# Patient Record
Sex: Female | Born: 1940 | Race: White | Hispanic: No | Smoking: Former smoker
Health system: Southern US, Community
[De-identification: ages and names within clinical notes are randomized; demographics above are authoritative.]

## PROBLEM LIST (undated history)

## (undated) DIAGNOSIS — I350 Nonrheumatic aortic (valve) stenosis: Secondary | ICD-10-CM

## (undated) DIAGNOSIS — I472 Ventricular tachycardia, unspecified: Secondary | ICD-10-CM

## (undated) DIAGNOSIS — I779 Disorder of arteries and arterioles, unspecified: Secondary | ICD-10-CM

## (undated) DIAGNOSIS — E669 Obesity, unspecified: Secondary | ICD-10-CM

## (undated) DIAGNOSIS — M1990 Unspecified osteoarthritis, unspecified site: Secondary | ICD-10-CM

## (undated) DIAGNOSIS — K529 Noninfective gastroenteritis and colitis, unspecified: Secondary | ICD-10-CM

## (undated) DIAGNOSIS — J42 Unspecified chronic bronchitis: Secondary | ICD-10-CM

## (undated) DIAGNOSIS — I10 Essential (primary) hypertension: Secondary | ICD-10-CM

## (undated) DIAGNOSIS — E119 Type 2 diabetes mellitus without complications: Secondary | ICD-10-CM

## (undated) DIAGNOSIS — K219 Gastro-esophageal reflux disease without esophagitis: Secondary | ICD-10-CM

## (undated) DIAGNOSIS — I739 Peripheral vascular disease, unspecified: Secondary | ICD-10-CM

## (undated) DIAGNOSIS — M75100 Unspecified rotator cuff tear or rupture of unspecified shoulder, not specified as traumatic: Secondary | ICD-10-CM

## (undated) DIAGNOSIS — J310 Chronic rhinitis: Secondary | ICD-10-CM

## (undated) DIAGNOSIS — I4729 Other ventricular tachycardia: Secondary | ICD-10-CM

## (undated) DIAGNOSIS — K5790 Diverticulosis of intestine, part unspecified, without perforation or abscess without bleeding: Secondary | ICD-10-CM

## (undated) DIAGNOSIS — G629 Polyneuropathy, unspecified: Secondary | ICD-10-CM

## (undated) DIAGNOSIS — E785 Hyperlipidemia, unspecified: Secondary | ICD-10-CM

## (undated) DIAGNOSIS — I219 Acute myocardial infarction, unspecified: Secondary | ICD-10-CM

## (undated) DIAGNOSIS — I2510 Atherosclerotic heart disease of native coronary artery without angina pectoris: Secondary | ICD-10-CM

## (undated) DIAGNOSIS — Z8669 Personal history of other diseases of the nervous system and sense organs: Secondary | ICD-10-CM

## (undated) HISTORY — DX: Acute myocardial infarction, unspecified: I21.9

## (undated) HISTORY — DX: Chronic rhinitis: J31.0

## (undated) HISTORY — DX: Unspecified rotator cuff tear or rupture of unspecified shoulder, not specified as traumatic: M75.100

## (undated) HISTORY — DX: Diverticulosis of intestine, part unspecified, without perforation or abscess without bleeding: K57.90

## (undated) HISTORY — DX: Hyperlipidemia, unspecified: E78.5

## (undated) HISTORY — DX: Ventricular tachycardia: I47.2

## (undated) HISTORY — DX: Nonrheumatic aortic (valve) stenosis: I35.0

## (undated) HISTORY — DX: Unspecified osteoarthritis, unspecified site: M19.90

## (undated) HISTORY — PX: TOTAL HIP ARTHROPLASTY: SHX124

## (undated) HISTORY — PX: CORONARY ANGIOPLASTY WITH STENT PLACEMENT: SHX49

## (undated) HISTORY — DX: Noninfective gastroenteritis and colitis, unspecified: K52.9

## (undated) HISTORY — PX: TONSILLECTOMY: SUR1361

## (undated) HISTORY — DX: Atherosclerotic heart disease of native coronary artery without angina pectoris: I25.10

## (undated) HISTORY — DX: Disorder of arteries and arterioles, unspecified: I77.9

## (undated) HISTORY — PX: ANGIOPLASTY: SHX39

## (undated) HISTORY — DX: Gastro-esophageal reflux disease without esophagitis: K21.9

## (undated) HISTORY — DX: Essential (primary) hypertension: I10

## (undated) HISTORY — PX: TOTAL ABDOMINAL HYSTERECTOMY: SHX209

## (undated) HISTORY — DX: Ventricular tachycardia, unspecified: I47.20

## (undated) HISTORY — DX: Type 2 diabetes mellitus without complications: E11.9

## (undated) HISTORY — DX: Peripheral vascular disease, unspecified: I73.9

## (undated) HISTORY — DX: Other ventricular tachycardia: I47.29

## (undated) HISTORY — DX: Obesity, unspecified: E66.9

---

## 2000-05-27 ENCOUNTER — Encounter: Payer: Self-pay | Admitting: *Deleted

## 2000-05-27 ENCOUNTER — Encounter: Admission: RE | Admit: 2000-05-27 | Discharge: 2000-05-27 | Payer: Self-pay | Admitting: *Deleted

## 2000-06-02 ENCOUNTER — Encounter: Payer: Self-pay | Admitting: *Deleted

## 2000-06-02 ENCOUNTER — Encounter: Admission: RE | Admit: 2000-06-02 | Discharge: 2000-06-02 | Payer: Self-pay | Admitting: *Deleted

## 2000-07-01 ENCOUNTER — Other Ambulatory Visit: Admission: RE | Admit: 2000-07-01 | Discharge: 2000-07-01 | Payer: Self-pay | Admitting: *Deleted

## 2000-10-13 ENCOUNTER — Encounter: Payer: Self-pay | Admitting: Internal Medicine

## 2000-10-13 ENCOUNTER — Ambulatory Visit (HOSPITAL_COMMUNITY): Admission: RE | Admit: 2000-10-13 | Discharge: 2000-10-13 | Payer: Self-pay | Admitting: Gastroenterology

## 2001-07-06 ENCOUNTER — Encounter: Payer: Self-pay | Admitting: *Deleted

## 2001-07-06 ENCOUNTER — Encounter: Admission: RE | Admit: 2001-07-06 | Discharge: 2001-07-06 | Payer: Self-pay | Admitting: *Deleted

## 2002-10-14 ENCOUNTER — Encounter: Admission: RE | Admit: 2002-10-14 | Discharge: 2002-10-14 | Payer: Self-pay | Admitting: Family Medicine

## 2002-10-14 ENCOUNTER — Encounter: Payer: Self-pay | Admitting: Family Medicine

## 2004-01-23 ENCOUNTER — Encounter: Admission: RE | Admit: 2004-01-23 | Discharge: 2004-01-23 | Payer: Self-pay | Admitting: Family Medicine

## 2004-12-03 ENCOUNTER — Inpatient Hospital Stay (HOSPITAL_COMMUNITY): Admission: EM | Admit: 2004-12-03 | Discharge: 2004-12-04 | Payer: Self-pay | Admitting: Family Medicine

## 2005-01-31 ENCOUNTER — Ambulatory Visit (HOSPITAL_COMMUNITY): Admission: RE | Admit: 2005-01-31 | Discharge: 2005-01-31 | Payer: Self-pay | Admitting: Family Medicine

## 2005-02-03 ENCOUNTER — Ambulatory Visit: Payer: Self-pay | Admitting: Internal Medicine

## 2005-02-06 ENCOUNTER — Ambulatory Visit: Payer: Self-pay | Admitting: Cardiology

## 2005-02-06 ENCOUNTER — Encounter: Payer: Self-pay | Admitting: Internal Medicine

## 2005-02-26 ENCOUNTER — Ambulatory Visit: Payer: Self-pay | Admitting: Internal Medicine

## 2005-03-13 ENCOUNTER — Ambulatory Visit: Payer: Self-pay | Admitting: Internal Medicine

## 2005-03-18 ENCOUNTER — Ambulatory Visit: Payer: Self-pay | Admitting: Internal Medicine

## 2005-03-18 ENCOUNTER — Ambulatory Visit: Admission: RE | Admit: 2005-03-18 | Discharge: 2005-03-18 | Payer: Self-pay | Admitting: Internal Medicine

## 2005-03-18 ENCOUNTER — Encounter (INDEPENDENT_AMBULATORY_CARE_PROVIDER_SITE_OTHER): Payer: Self-pay | Admitting: Specialist

## 2005-03-25 ENCOUNTER — Ambulatory Visit: Payer: Self-pay | Admitting: Internal Medicine

## 2005-04-09 ENCOUNTER — Ambulatory Visit: Payer: Self-pay | Admitting: Internal Medicine

## 2005-04-29 ENCOUNTER — Ambulatory Visit: Payer: Self-pay | Admitting: Internal Medicine

## 2005-05-13 ENCOUNTER — Ambulatory Visit (HOSPITAL_COMMUNITY): Admission: RE | Admit: 2005-05-13 | Discharge: 2005-05-13 | Payer: Self-pay | Admitting: Internal Medicine

## 2005-05-13 ENCOUNTER — Encounter: Payer: Self-pay | Admitting: Internal Medicine

## 2005-05-15 ENCOUNTER — Ambulatory Visit: Payer: Self-pay | Admitting: Pulmonary Disease

## 2005-05-27 ENCOUNTER — Ambulatory Visit: Payer: Self-pay | Admitting: Internal Medicine

## 2006-09-10 ENCOUNTER — Ambulatory Visit: Payer: Self-pay | Admitting: *Deleted

## 2007-08-27 ENCOUNTER — Encounter: Payer: Self-pay | Admitting: Critical Care Medicine

## 2007-09-13 DIAGNOSIS — E785 Hyperlipidemia, unspecified: Secondary | ICD-10-CM | POA: Insufficient documentation

## 2007-09-13 DIAGNOSIS — I10 Essential (primary) hypertension: Secondary | ICD-10-CM | POA: Insufficient documentation

## 2007-09-13 DIAGNOSIS — J310 Chronic rhinitis: Secondary | ICD-10-CM | POA: Insufficient documentation

## 2007-09-14 ENCOUNTER — Ambulatory Visit: Payer: Self-pay | Admitting: Critical Care Medicine

## 2007-09-14 DIAGNOSIS — E119 Type 2 diabetes mellitus without complications: Secondary | ICD-10-CM

## 2007-09-15 DIAGNOSIS — K219 Gastro-esophageal reflux disease without esophagitis: Secondary | ICD-10-CM

## 2007-09-16 ENCOUNTER — Telehealth (INDEPENDENT_AMBULATORY_CARE_PROVIDER_SITE_OTHER): Payer: Self-pay | Admitting: *Deleted

## 2007-09-16 ENCOUNTER — Ambulatory Visit: Payer: Self-pay | Admitting: *Deleted

## 2007-09-27 ENCOUNTER — Telehealth: Payer: Self-pay | Admitting: Critical Care Medicine

## 2007-09-28 ENCOUNTER — Ambulatory Visit: Payer: Self-pay | Admitting: Critical Care Medicine

## 2007-09-29 ENCOUNTER — Telehealth (INDEPENDENT_AMBULATORY_CARE_PROVIDER_SITE_OTHER): Payer: Self-pay | Admitting: *Deleted

## 2007-09-30 ENCOUNTER — Telehealth (INDEPENDENT_AMBULATORY_CARE_PROVIDER_SITE_OTHER): Payer: Self-pay | Admitting: *Deleted

## 2007-09-30 ENCOUNTER — Ambulatory Visit: Payer: Self-pay | Admitting: Pulmonary Disease

## 2007-09-30 DIAGNOSIS — IMO0002 Reserved for concepts with insufficient information to code with codable children: Secondary | ICD-10-CM

## 2007-09-30 DIAGNOSIS — I479 Paroxysmal tachycardia, unspecified: Secondary | ICD-10-CM | POA: Insufficient documentation

## 2007-10-04 ENCOUNTER — Telehealth (INDEPENDENT_AMBULATORY_CARE_PROVIDER_SITE_OTHER): Payer: Self-pay | Admitting: *Deleted

## 2007-10-23 ENCOUNTER — Encounter: Payer: Self-pay | Admitting: Critical Care Medicine

## 2007-11-09 ENCOUNTER — Encounter: Payer: Self-pay | Admitting: Critical Care Medicine

## 2007-11-10 ENCOUNTER — Ambulatory Visit: Payer: Self-pay | Admitting: Internal Medicine

## 2007-11-23 ENCOUNTER — Ambulatory Visit: Payer: Self-pay | Admitting: Critical Care Medicine

## 2007-11-23 DIAGNOSIS — J4540 Moderate persistent asthma, uncomplicated: Secondary | ICD-10-CM

## 2007-12-10 ENCOUNTER — Ambulatory Visit: Payer: Self-pay | Admitting: Cardiovascular Disease

## 2007-12-11 ENCOUNTER — Inpatient Hospital Stay (HOSPITAL_COMMUNITY): Admission: EM | Admit: 2007-12-11 | Discharge: 2007-12-13 | Payer: Self-pay | Admitting: Emergency Medicine

## 2008-01-20 ENCOUNTER — Encounter (HOSPITAL_COMMUNITY): Admission: RE | Admit: 2008-01-20 | Discharge: 2008-03-17 | Payer: Self-pay | Admitting: Cardiovascular Disease

## 2008-02-11 ENCOUNTER — Ambulatory Visit: Payer: Self-pay | Admitting: Internal Medicine

## 2008-02-28 ENCOUNTER — Ambulatory Visit: Payer: Self-pay | Admitting: Critical Care Medicine

## 2008-02-29 LAB — CONVERTED CEMR LAB: IgE (Immunoglobulin E), Serum: 27.4 intl units/mL (ref 0.0–180.0)

## 2008-05-03 ENCOUNTER — Ambulatory Visit: Payer: Self-pay | Admitting: Critical Care Medicine

## 2008-05-03 DIAGNOSIS — J209 Acute bronchitis, unspecified: Secondary | ICD-10-CM

## 2009-01-02 ENCOUNTER — Ambulatory Visit: Payer: Self-pay | Admitting: Critical Care Medicine

## 2009-01-24 ENCOUNTER — Ambulatory Visit: Payer: Self-pay | Admitting: Critical Care Medicine

## 2009-02-13 ENCOUNTER — Encounter: Payer: Self-pay | Admitting: Critical Care Medicine

## 2009-04-05 ENCOUNTER — Ambulatory Visit: Payer: Self-pay | Admitting: Critical Care Medicine

## 2009-05-28 ENCOUNTER — Encounter: Payer: Self-pay | Admitting: Critical Care Medicine

## 2009-06-05 ENCOUNTER — Encounter: Payer: Self-pay | Admitting: Critical Care Medicine

## 2009-07-02 ENCOUNTER — Ambulatory Visit: Payer: Self-pay | Admitting: Critical Care Medicine

## 2009-07-23 ENCOUNTER — Emergency Department (HOSPITAL_COMMUNITY): Admission: EM | Admit: 2009-07-23 | Discharge: 2009-07-23 | Payer: Self-pay | Admitting: Emergency Medicine

## 2009-08-14 ENCOUNTER — Encounter: Payer: Self-pay | Admitting: Critical Care Medicine

## 2009-08-14 ENCOUNTER — Ambulatory Visit: Payer: Self-pay | Admitting: Internal Medicine

## 2009-08-22 DIAGNOSIS — J449 Chronic obstructive pulmonary disease, unspecified: Secondary | ICD-10-CM | POA: Insufficient documentation

## 2009-08-24 ENCOUNTER — Encounter (INDEPENDENT_AMBULATORY_CARE_PROVIDER_SITE_OTHER): Payer: Self-pay | Admitting: *Deleted

## 2009-09-06 ENCOUNTER — Ambulatory Visit: Payer: Self-pay | Admitting: Vascular Surgery

## 2009-10-10 ENCOUNTER — Ambulatory Visit: Payer: Self-pay | Admitting: Critical Care Medicine

## 2009-10-24 ENCOUNTER — Telehealth: Payer: Self-pay | Admitting: Internal Medicine

## 2009-10-24 ENCOUNTER — Encounter (INDEPENDENT_AMBULATORY_CARE_PROVIDER_SITE_OTHER): Payer: Self-pay | Admitting: *Deleted

## 2009-11-08 ENCOUNTER — Encounter (INDEPENDENT_AMBULATORY_CARE_PROVIDER_SITE_OTHER): Payer: Self-pay | Admitting: *Deleted

## 2009-11-13 DIAGNOSIS — M899 Disorder of bone, unspecified: Secondary | ICD-10-CM | POA: Insufficient documentation

## 2009-11-13 DIAGNOSIS — R197 Diarrhea, unspecified: Secondary | ICD-10-CM

## 2009-11-13 DIAGNOSIS — M949 Disorder of cartilage, unspecified: Secondary | ICD-10-CM

## 2009-11-13 DIAGNOSIS — R142 Eructation: Secondary | ICD-10-CM

## 2009-11-13 DIAGNOSIS — R143 Flatulence: Secondary | ICD-10-CM

## 2009-11-13 DIAGNOSIS — K5909 Other constipation: Secondary | ICD-10-CM

## 2009-11-13 DIAGNOSIS — Z8669 Personal history of other diseases of the nervous system and sense organs: Secondary | ICD-10-CM

## 2009-11-13 DIAGNOSIS — Z860100 Personal history of colon polyps, unspecified: Secondary | ICD-10-CM | POA: Insufficient documentation

## 2009-11-13 DIAGNOSIS — K5730 Diverticulosis of large intestine without perforation or abscess without bleeding: Secondary | ICD-10-CM | POA: Insufficient documentation

## 2009-11-13 DIAGNOSIS — R141 Gas pain: Secondary | ICD-10-CM

## 2009-11-20 ENCOUNTER — Ambulatory Visit: Payer: Self-pay | Admitting: Internal Medicine

## 2009-11-21 ENCOUNTER — Encounter: Payer: Self-pay | Admitting: Internal Medicine

## 2009-11-28 ENCOUNTER — Telehealth: Payer: Self-pay | Admitting: Internal Medicine

## 2010-04-11 ENCOUNTER — Ambulatory Visit
Admission: RE | Admit: 2010-04-11 | Payer: Self-pay | Source: Home / Self Care | Attending: Interventional Cardiology | Admitting: Interventional Cardiology

## 2010-05-02 NOTE — Letter (Signed)
Summary: [facility]-Office Note  [facility]-Office Note   Imported By: Lamona Curl CMA (AAMA) 11/19/2009 08:02:33  _____________________________________________________________________  External Attachment:    Type:   Image     Comment:   External Document

## 2010-05-02 NOTE — Procedures (Signed)
Summary: GI Endosc Procedure- Colonoscopy  GI Endosc Procedure- Colonoscopy   Imported By: Harlow Mares CMA (AAMA) 11/14/2009 10:29:01  _____________________________________________________________________  External Attachment:    Type:   Image     Comment:   External Document

## 2010-05-02 NOTE — Assessment & Plan Note (Signed)
Summary: diarrhea--ch.   History of Present Illness Visit Type: new patient  Primary GI MD: Lina Sar MD Primary Larayah Clute: Larina Earthly, MD  Requesting Kewon Statler: na Chief Complaint: bloating, change in bowel habits, constipation, diarrhea, and fecal incontinence  History of Present Illness:   This is a 70 year old white female with a 3 month history of diarrhea. She describes loose, watery stools several times a day complicated by occasional urgency incontinence. She denies any abdominal pain or rectal bleeding. She had several episodes of diarrhea in the past year. Her main problem used to be  constipation. She has been a diabetic for the past 5 years and has been on metformin. She has discontinued metformin for 2 or 3 days at a time but cannot make any judgment as to whether it slows or stops the diarrhea. Her weight has been stable or slightly increased. There is no history of lactose intolerance. There is no family history of colon cancer or inflammatory bowel disease. A colonoscopy in July 2002 showed extensive diverticulosis of the left colon. It was a difficult exam according to  Dr. Sherin Quarry who did the exam. Patient has tried using fiber but has not noticed any improvement. She had a colonoscopy in 1996 with findings of a benign colon polyp. Other medical problems include osteoporosis and COPD.   GI Review of Systems    Reports bloating.      Denies abdominal pain, acid reflux, belching, chest pain, dysphagia with liquids, dysphagia with solids, heartburn, loss of appetite, nausea, vomiting, vomiting blood, weight loss, and  weight gain.      Reports change in bowel habits, constipation, diarrhea, diverticulosis, and  fecal incontinence.     Denies anal fissure, black tarry stools, heme positive stool, hemorrhoids, irritable bowel syndrome, jaundice, light color stool, liver problems, rectal bleeding, and  rectal pain.    Current Medications (verified): 1)  Multivitamins   Tabs  (Multiple Vitamin) .Marland Kitchen.. 1 By Mouth Daily 2)  B Complete   Tabs (B Complex-Biotin-Fa) .Marland Kitchen.. 1 By Mouth Daily 3)  Benicar Hct 40-12.5 Mg  Tabs (Olmesartan Medoxomil-Hctz) .Marland Kitchen.. 1 By Mouth Daily 4)  Aspirin 325 Mg Tabs (Aspirin) .... Once Daily 5)  Allegra 180 Mg  Tabs (Fexofenadine Hcl) .Marland Kitchen.. 1 By Mouth Daily 6)  Furosemide 40 Mg  Tabs (Furosemide) .... Using As Needed 7)  Metformin Hcl 500 Mg  Tabs (Metformin Hcl) .Marland Kitchen.. 1 By Mouth Daily 8)  Coq10 100 Mg  Caps (Coenzyme Q10) .Marland Kitchen.. 1 By Mouth Daily 9)  Proair Hfa 108 (90 Base) Mcg/act Aers (Albuterol Sulfate) .... 2 Puffs Every 4-6 Hr As Needed Wheezing/shortness of Breath 10)  Symbicort 80-4.5 Mcg/act  Aero (Budesonide-Formoterol Fumarate) .... One To Two Puffs Twice Daily 11)  Pravastatin Sodium 40 Mg Tabs (Pravastatin Sodium) .Marland Kitchen.. 1 By Mouth Daily 12)  Bystolic 2.5 Mg Tabs (Nebivolol Hcl) .Marland Kitchen.. 1 By Mouth Daily 13)  Mucinex 600 Mg Xr12h-Tab (Guaifenesin) .... 2-3 Times Daily 14)  Lovaza 1 Gm Caps (Omega-3-Acid Ethyl Esters) .... Once Daily 15)  Tums 500 Mg Chew (Calcium Carbonate Antacid) .... As Needed 16)  Glucosamine 500 Mg Caps (Glucosamine Sulfate) .... Two Times A Day 17)  Vitamin D .... Once Daily 18)  Aerochamber Mv  Misc (Spacer/aero-Holding Chambers) .... Use With Dulera 19)  Melatonin-Pyridoxine 5-10 Mg Tabs (Melatonin-Pyridoxine) .... One Tablet By Mouth Once Daily 20)  Unisom 25 Mg Tabs (Doxylamine Succinate (Sleep)) .... As Needed 21)  Imodium A-D 2 Mg Tabs (Loperamide Hcl) .... As Needed  Allergies (verified): 1)  ! Keflex 2)  ! Ceftin 3)  ! * Clarithyromycin 4)  ! * Shellfish  Past History:  Past Medical History: ASTHMA, PERSISTENT, MODERATE (ICD-493.90)    -FeV1 96%  DLCO 82% 8/09 HYPERTENSION NEC (ICD-997.91) PAROXYSMAL TACHYCARDIA (ICD-427.2) GERD, SEVERE (ICD-530.81) DIABETES, TYPE 2 (ICD-250.00) RHINITIS (ICD-472.0) HYPERTENSION (ICD-401.9) HYPERLIPIDEMIA (ICD-272.4) Arthritis COPD Coronary Artery  Disease Diverticulosis Colon Polyps  Past Surgical History: Hysterectomy Tonsillectomy Angioplasty/Stent   Family History: Mother--allergies Prostate cancer--MGF No FH of Colon Cancer  Social History: Retired Charity fundraiser. Widowed. Patient is a former smoker. -stopped 20 years ago Illicit Drug Use - no Patient gets regular exercise. Alcohol Use - yes: 1 daily  Daily Caffeine Use: 1 daily   Review of Systems       The patient complains of arthritis/joint pain, back pain, blood in urine, cough, fatigue, heart rhythm changes, shortness of breath, sleeping problems, sore throat, swelling of feet/legs, swollen lymph glands, and urination changes/pain.  The patient denies allergy/sinus, anemia, anxiety-new, breast changes/lumps, change in vision, confusion, coughing up blood, depression-new, fainting, fever, headaches-new, hearing problems, heart murmur, itching, menstrual pain, muscle pains/cramps, night sweats, nosebleeds, pregnancy symptoms, skin rash, thirst - excessive , urination - excessive , urine leakage, vision changes, and voice change.         Pertinent positive and negative review of systems were noted in the above HPI. All other ROS was otherwise negative.   Vital Signs:  Patient profile:   70 year old female Height:      64 inches Weight:      177 pounds BMI:     30.49 BSA:     1.86 Pulse rate:   94 / minute Pulse rhythm:   regular BP sitting:   132 / 68  (left arm) Cuff size:   regular  Vitals Entered By: Ok Anis CMA (November 20, 2009 11:44 AM)  Physical Exam  General:  alert, oriented and in no distress. Eyes:  nonicteric. Mouth:  no thrush. Neck:  Supple; no masses or thyromegaly. Lungs:  Clear throughout to auscultation. Heart:  Regular rate and rhythm; no murmurs, rubs,  or bruits. Abdomen:  soft abdomen with slightly hyperactive bowel sounds. Minimal tenderness on deep pressure in left lower quadrant. No fullness or mass. Liver edge at costal  margin. Rectal:  normal rectal sphincter tone and normal squeeze. Stool is soft and Hemoccult negative. Extremities:  No clubbing, cyanosis, edema or deformities noted. Skin:  Intact without significant lesions or rashes. Psych:  Alert and cooperative. Normal mood and affect.   Impression & Recommendations:  Problem # 1:  DIARRHEA (ICD-787.91) First, we need to r/o the possibility of Metformin being the cause of pt's diarrhea. Next , we need to r/o infectious cause . We will obtain stool studies. Microscpic colitis is also a consideration, which  would be diagnosed by direct vis and biopsies of the colon. Diabetic visceral neuropathy  cause diarrhe , usually nocturnal, due to bacterial overgrowth. We will start empirically Flagyl 250 mg by mouth three times a day. Severe IBS  is a conssideration as well as symptomatic diverticulosis.  Orders: T-Tissue Transglutamase Ab IgA [id number]) T-Stool for O&P [id number]) T-Culture, Stool ([id number]) T-Culture, C-Diff Toxin A/B ([id number]) T-Fecal WBC ([id number]) TLB-Sedimentation Rate (ESR) ([id number])  Problem # 2:  COLONIC POLYPS, HX OF (ICD-V12.72) Patient had no polyps or  on  colonoscopy in 2002. Prior  colonoscopy  in the 1990s showed benign polyps.  Patient Instructions: 1)  stool for O&P, stool  culture, C. difficile and lactoferrin. 2)  Stop the metformin x 7 days. 3)  If no improvement, start Flagyl 250 mg p.o. t.i.d. x 10 days. 4)  Bentyl 20 mg p.o. b.i.d. 5)  tissue transglutaminase and sedimentation rate to be drawn today. 6)  If no improvement with Flagyl, we will schedule a colonoscopy with MiraLax prep to rule out microscopic colitis. 7)  Copy sent to : Dr Felipa Eth 8)  The medication list was reviewed and reconciled.  All changed / newly prescribed medications were explained.  A complete medication list was provided to the patient / caregiver. Prescriptions: BENTYL 20 MG TABS (DICYCLOMINE HCL) Take 1 tablet by mouth  two times a day  #40 x 1   Entered by:   Lamona Curl CMA (AAMA)   Authorized by:   Hart Carwin MD   Signed by:   Lamona Curl CMA (AAMA) on 11/20/2009   Method used:   Electronically to        [facility]. [phone]* (retail)       [address]       [place], [place]  [address]       Ph: [phone]       Fax: [phone]   RxID:   [id number] FLAGYL 250 MG TABS (METRONIDAZOLE) Take 1 tablet by mouth three times a day x 10 days  #30 x 0   Entered by:   Lamona Curl CMA (AAMA)   Authorized by:   Hart Carwin MD   Signed by:   Lamona Curl CMA (AAMA) on 11/20/2009   Method used:   Electronically to        [facility]. [phone]* (retail)       [address]       [place], [place]  [address]       Ph: [phone]       Fax: [phone]   RxID:   [id number]

## 2010-05-02 NOTE — Letter (Signed)
Summary: New Patient letter  [facility]  [address] [place], [place] [address]   Phone: [phone]  Fax: [phone]       11/08/2009 MRN: [id number]  [facility] [address] [place], [place]  [address]  Dear Ms. Colan,  Welcome to the Gastroenterology Division at [facility].    You are scheduled to see Dr.  Juanda Chance on 11-20-09 at 10:30a.m. on the 3rd floor at [facility], [address]  We ask that you try to arrive at our office 15 minutes prior to your appointment time to allow for check-in.  We would like you to complete the enclosed self-administered evaluation form prior to your visit and bring it with you on the day of your appointment.  We will review it with you.  Also, please bring a complete list of all your medications or, if you prefer, bring the medication bottles and we will list them.  Please bring your insurance card so that we may make a copy of it.  If your insurance requires a referral to see a specialist, please bring your referral form from your primary care physician.  Co-payments are due at the time of your visit and may be paid by cash, check or credit card.     Your office visit will consist of a consult with your physician (includes a physical exam), any laboratory testing he/she may order, scheduling of any necessary diagnostic testing (e.g. x-ray, ultrasound, CT-scan), and scheduling of a procedure (e.g. Endoscopy, Colonoscopy) if required.  Please allow enough time on your schedule to allow for any/all of these possibilities.    If you cannot keep your appointment, please call [phone] to cancel or reschedule prior to your appointment date.  This allows [place] the opportunity to schedule an appointment for another patient in need of care.  If you do not cancel or reschedule by 5 p.m. the business day prior to your appointment date, you will be charged a $50.00 late cancellation/no-show fee.    Thank you for choosing  [facility] for your medical needs.  We appreciate the opportunity to care for you.  Please visit [place] at our website  to learn more about our practice.                     Sincerely,                                                             The Gastroenterology Division

## 2010-05-02 NOTE — Miscellaneous (Signed)
Summary: Orders Update   Clinical Lists Changes  Orders: Added new Service order of Est. Patient Level IV (99214) - Signed 

## 2010-05-02 NOTE — Progress Notes (Signed)
Summary: Triage  Phone Note Call from Patient Call back at Home Phone [phone]   Caller: Patient Call For: Dr. Juanda Chance Reason for Call: Lab or Test Results Summary of Call: Calling about her culture results Initial call taken by: Karna Christmas,  November 28, 2009 2:19 PM  Follow-up for Phone Call        questions about stool studies answered. Follow-up by: Darcey Nora RN, CGRN,  November 28, 2009 2:51 PM

## 2010-05-02 NOTE — Assessment & Plan Note (Signed)
Summary: Pulmonary OV   Visit Type:  Follow-up Primary Provider/Referring Provider:  Dr. Harvie Heck  CC:  The patient is here for follow-up. she says her breathing is slightly better although she does c/o sob with walking. she has switched back to Symbicort and is only inhaling 1 puff daily.Marland Kitchen  History of Present Illness:  70  YO Female with moderate persistent asthma        October 10, 2009 11:36 AM The pt is back on symbicort and off dulera and doing well.  No new issues. Is off zyflo now Pt denies any significant sore throat, nasal congestion or excess secretions, fever, chills, sweats, unintended weight loss, pleurtic or exertional chest pain, orthopnea PND, or leg swelling Pt denies any increase in rescue therapy over baseline, denies waking up needing it or having any early am or nocturnal exacerbations of coughing/wheezing/or dyspnea.    Asthma History    Asthma Control Assessment:    Age range: 12+ years    Symptoms: 0-2 days/week    Nighttime Awakenings: 0-2/month    Interferes w/ normal activity: no limitations    SABA use (not for EIB): 0-2 days/week    ATAQ questionnaire: 0    FEV1: 4.85 liters (today)    FEV1 Pred: 2.25 liters (today)    Exacerbations requiring oral systemic steroids: 0-1/year    Asthma Control Assessment: Well Controlled  Clinical Reports Reviewed:  PFT's:  11/23/2007: DLCO %Predicted:  82 FEF 25/75 %Predicted:  64 FEV1 %Predicted:  96 FVC %Predicted:  92 Post Spirometry FEF 25/75 %Predicted:  77 Post Spirometry FEV1 %Predicted:  101 Post Spirometry FVC %Predicted:  93 RV %Predicted:  68 TLC %Predicted:  82    Preventive Screening-Counseling & Management  Alcohol-Tobacco     Smoking Status: never     Year Quit: 2000     Pack years: 20  Current Medications (verified): 1)  Multivitamins   Tabs (Multiple Vitamin) .Marland Kitchen.. 1 By Mouth Daily 2)  B Complete   Tabs (B Complex-Biotin-Fa) .Marland Kitchen.. 1 By Mouth Daily 3)  Benicar Hct 40-12.5 Mg   Tabs (Olmesartan Medoxomil-Hctz) .Marland Kitchen.. 1 By Mouth Daily 4)  Aspirin 325 Mg Tabs (Aspirin) .... Once Daily 5)  Allegra 180 Mg  Tabs (Fexofenadine Hcl) .Marland Kitchen.. 1 By Mouth Daily 6)  Furosemide 40 Mg  Tabs (Furosemide) .... Using As Needed 7)  Metformin Hcl 500 Mg  Tabs (Metformin Hcl) .Marland Kitchen.. 1 By Mouth Daily 8)  Coq10 100 Mg  Caps (Coenzyme Q10) .Marland Kitchen.. 1 By Mouth Daily 9)  Proair Hfa 108 (90 Base) Mcg/act Aers (Albuterol Sulfate) .... 2 Puffs Every 4-6 Hr As Needed Wheezing/shortness of Breath 10)  Symbicort 160-4.5 Mcg/act Aero (Budesonide-Formoterol Fumarate) .Marland Kitchen.. 1 Puff Daily 11)  Pravastatin Sodium 40 Mg Tabs (Pravastatin Sodium) .Marland Kitchen.. 1 By Mouth Daily 12)  Bystolic 2.5 Mg Tabs (Nebivolol Hcl) .Marland Kitchen.. 1 By Mouth Daily 13)  Mucinex 600 Mg Xr12h-Tab (Guaifenesin) .... 2-3 Times Daily 14)  Lovaza 1 Gm Caps (Omega-3-Acid Ethyl Esters) .... Once Daily 15)  Tums 500 Mg Chew (Calcium Carbonate Antacid) .... As Needed 16)  Glucosamine 500 Mg Caps (Glucosamine Sulfate) .... Two Times A Day 17)  Vitamin D .... Once Daily 18)  Zyflo Cr 600 Mg Xr12h-Tab (Zileuton) .... Two By Mouth Two Times A Day 19)  Aerochamber Mv  Misc (Spacer/aero-Holding Chambers) .... Use With Dulera  Allergies (verified): 1)  ! Keflex 2)  ! Ceftin  Past History:  Past medical, surgical, family and social histories (including risk factors)  reviewed, and no changes noted (except as noted below).  Past Medical History: Reviewed history from 02/11/2008 and no changes required. 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 Hysterectomy  Family History: Reviewed history from 09/14/2007 and no changes  required. Mother--allergies Prostate cancer--MGF  Social History: Reviewed history from 09/14/2007 and no changes required. Patient states former smoker.  Retired Charity fundraiser. Widowed.  Review of Systems       The patient complains of shortness of breath with activity.  The patient denies shortness of breath at rest, productive cough, non-productive cough, coughing up blood, chest pain, irregular heartbeats, acid heartburn, indigestion, loss of appetite, weight change, abdominal pain, difficulty swallowing, sore throat, tooth/dental problems, headaches, nasal congestion/difficulty breathing through nose, sneezing, itching, ear ache, anxiety, depression, hand/feet swelling, joint stiffness or pain, rash, change in color of mucus, and fever.    Vital Signs:  Patient profile:   70 year old female Height:      64 inches (162.56 cm) Weight:      179 pounds (81.36 kg) BMI:     30.84 O2 Sat:      97 % on Room air Temp:     98.7 degrees F (37.06 degrees C) oral Pulse rate:   102 / minute BP sitting:   118 / 78  (right arm) Cuff size:   regular  Vitals Entered By: Michel Bickers CMA (October 10, 2009 11:34 AM)  O2 Sat at Rest %:  97 O2 Flow:  Room air CC: The patient is here for follow-up. she says her breathing is slightly better although she does c/o sob with walking. she has switched back to Symbicort and is only inhaling 1 puff daily. Comments Medications reviewed. Daytime phone verified. Michel Bickers CMA  October 10, 2009 11:35 AM   Physical Exam  Additional Exam:  Gen: WD WN    WF      in NAD    NCAT Heent:  no jvd, no TMG, no cervical LNademopathy, orophyx clear,  nares with clear watery drainage. Cor: RRR nl s1/s2  no s3/s4  no m r h g Abd: soft NT BSA   no masses  No HSM  no rebound or guarding Ext perfused with no c v e v.d Neuro: intact, moves all 4s, CN II-XII intact, DTRs intact Chest: coarse BS w/ no wheezing Skin: clear  Genital/Rectal :deferred    Pulmonary Function Test Date:  10/10/2009 11:46 AM Gender: Female  Pre-Spirometry FVC    Value: 5.76 L/min   % Pred: 194 % FEV1    Value: 4.85 L     Pred: 2.25 L     % Pred: 215.70 % FEV1/FVC  Value: 84.15 %     % Pred: 110.80 %  Impression & Recommendations:  Problem # 1:  ASTHMA, PERSISTENT, MODERATE (ICD-493.90) Assessment Improved  Moderate persistent asthma, intolerant of beta agonists LABAs in the past but doing ok on symbicort now  plan ok to stay off zyflo reduce symbicort to 80  one to two puff two times a day   Medications Added to Medication List This Visit: 1)  Symbicort 160-4.5 Mcg/act Aero (Budesonide-formoterol fumarate) .Marland Kitchen.. 1 puff daily 2)  Symbicort 80-4.5 Mcg/act Aero (Budesonide-formoterol fumarate) .... One to two puffs twice daily  Complete Medication List: 1)  Multivitamins Tabs (Multiple vitamin) .Marland Kitchen.. 1 by mouth daily 2)  B Complete Tabs (B complex-biotin-fa) .Marland Kitchen.. 1 by mouth daily 3)  Benicar Hct 40-12.5 Mg Tabs (Olmesartan medoxomil-hctz) .Marland Kitchen.. 1 by mouth daily 4)  Aspirin 325 Mg Tabs (Aspirin) .... Once daily 5)  Allegra 180 Mg Tabs (Fexofenadine hcl) .Marland Kitchen.. 1 by mouth daily 6)  Furosemide 40 Mg Tabs (Furosemide) .... Using as needed 7)  Metformin Hcl 500 Mg Tabs (Metformin hcl) .Marland Kitchen.. 1 by mouth daily 8)  Coq10 100 Mg Caps (Coenzyme q10) .Marland Kitchen.. 1 by mouth daily 9)  Proair Hfa 108 (90 Base) Mcg/act Aers (Albuterol sulfate) .... 2 puffs every 4-6 hr as needed wheezing/shortness of breath 10)  Symbicort 80-4.5 Mcg/act Aero (Budesonide-formoterol fumarate) .... One to two puffs twice daily 11)  Pravastatin Sodium 40 Mg Tabs (Pravastatin sodium) .Marland Kitchen.. 1 by mouth daily 12)  Bystolic 2.5 Mg Tabs (Nebivolol hcl) .Marland Kitchen.. 1 by mouth daily 13)  Mucinex 600 Mg Xr12h-tab (Guaifenesin) .... 2-3 times daily 14)  Lovaza 1 Gm Caps (Omega-3-acid ethyl esters) .... Once daily 15)  Tums 500 Mg Chew (Calcium carbonate antacid) .... As needed 16)  Glucosamine 500 Mg Caps (Glucosamine sulfate) .... Two times a  day 17)  Vitamin D  .... Once daily 18)  Aerochamber Mv Misc (Spacer/aero-holding chambers) .... Use with dulera  Other Orders: Spirometry w/Graph (94010) Est. Patient Level IV ([address])  Patient Instructions: 1)  Ok to reduce Symbicort to 80  one to two puff twice daily 2)  Stay off zyflo 3)  Return 2 months Prescriptions: SYMBICORT 80-4.5 MCG/ACT  AERO (BUDESONIDE-FORMOTEROL FUMARATE) one to two puffs twice daily  #1 x 6   Entered and Authorized by:   Storm Frisk MD   Signed by:   Storm Frisk MD on 10/10/2009   Method used:   Print then Give to Patient   RxID:   [id number]    [facility] Spirometry  ID: [id number] Patient: Cheryl Hernandez A DOB: [birth year] Age: 70 Years Old Sex: Female Race: White Physician: Dr. Shan Levans Height: 64 Weight: 179 Status: Unconfirmed Past Medical History:  ASTHMA, PERSISTENT, MODERATE (ICD-493.90)    -FeV1 96%  DLCO 82% 8/09 HYPERTENSION NEC (ICD-997.91) PAROXYSMAL TACHYCARDIA (ICD-427.2) GERD, SEVERE (ICD-530.81) DIABETES, TYPE 2 (ICD-250.00) RHINITIS (ICD-472.0) HYPERTENSION (ICD-401.9) HYPERLIPIDEMIA (ICD-272.4)   Recorded: 10/10/2009 11:46 AM  Parameter  Measured Predicted %Predicted FVC     5.76        2.97        194 FEV1     4.85        2.25        215.70 FEV1%   84.15        75.93        110.80 PEF    16.02        5.61        285.50   Interpretation:

## 2010-05-02 NOTE — Assessment & Plan Note (Signed)
Summary: Pulmonary OV   Primary Provider/Referring Provider:  Dr. Harvie Heck  CC:  1 month follow up.  Pt states she continues to have SOB with activity, chest congestion, wheezing, and chest tightness and cough with "very little" white mucus.  States these sxs are the same since last ov-no better or worse.  States she was unable to do 2 puffs bid on dulera because it icreased BP.  currently taking dulera 1 puff qd.  .  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  The pt has a hx of beta agonist intolerance. The pt stays congested.  The pt  could not take spiriva due to bladder issue with retention.  The pt is not on singulair now ? if helped when on this in the past.   has been on singulair     Asthma History    Asthma Control Assessment:    Age range: 12+ years    Symptoms: >2 days/week    Nighttime Awakenings: 0-2/month    Interferes w/ normal activity: some limitations    SABA use (not for EIB): >2 days/week    ATAQ questionnaire: 1-2    FEV1: 1.96 liters (today)    FEV1 Pred: 2.04 liters (today)    Exacerbations requiring oral systemic steroids: 0-1/year    Asthma Control Assessment: Not Well Controlled   Preventive Screening-Counseling & Management  Alcohol-Tobacco     Smoking Status: never  Current Medications (verified): 1)  Multivitamins   Tabs (Multiple Vitamin) .Marland Kitchen.. 1 By Mouth Daily 2)  B Complete   Tabs (B Complex-Biotin-Fa) .Marland Kitchen.. 1 By Mouth Daily 3)  Benicar Hct 40-12.5 Mg  Tabs (Olmesartan Medoxomil-Hctz) .Marland Kitchen.. 1 By Mouth Daily 4)  Aspirin 325 Mg Tabs (Aspirin) .... Once Daily 5)  Allegra 180 Mg  Tabs (Fexofenadine Hcl) .Marland Kitchen.. 1 By Mouth Daily 6)  Furosemide 40 Mg  Tabs (Furosemide) .... Using As Needed 7)  Metformin Hcl 500 Mg  Tabs (Metformin Hcl) .Marland Kitchen.. 1 By Mouth  Daily 8)  Coq10 100 Mg  Caps (Coenzyme Q10) .Marland Kitchen.. 1 By Mouth Daily 9)  Proair Hfa 108 (90 Base) Mcg/act Aers (Albuterol Sulfate) .... 2 Puffs Every 4-6 Hr As Needed Wheezing/shortness of Breath 10)  Dulera 200-5 Mcg/act Aero (Mometasone Furo-Formoterol Fum) .Marland Kitchen.. 1 Puff Once Per Day Failed Symbicort, Advair 11)  Pravastatin Sodium 40 Mg Tabs (Pravastatin Sodium) .Marland Kitchen.. 1 By Mouth Daily 12)  Bystolic 2.5 Mg Tabs (Nebivolol Hcl) .Marland Kitchen.. 1 By Mouth Daily 13)  Mucinex 600 Mg Xr12h-Tab (Guaifenesin) .... 2-3 Times Daily 14)  Lovaza 1 Gm Caps (Omega-3-Acid Ethyl Esters) .... Once Daily 15)  Tums 500 Mg Chew (Calcium Carbonate Antacid) .... As Needed 16)  Glucosamine 500 Mg Caps (Glucosamine Sulfate) .... Two Times A Day 17)  Vitamin D .... Once Daily  Allergies (verified): 1)  ! Keflex 2)  ! Ceftin  Past History:  Past medical, surgical, family and social histories (including risk factors) reviewed, and no changes noted (except as noted below).  Past Medical History: Reviewed history from 02/11/2008 and no changes required. 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 Hysterectomy  Family History: Reviewed history from 09/14/2007 and no changes required. Mother--allergies Prostate cancer--MGF  Social History: Reviewed history from 09/14/2007 and no changes required. Patient states former smoker.  Retired Charity fundraiser. Widowed. Smoking Status:  never  Review of Systems       The patient complains of shortness of breath with activity, productive cough, and non-productive cough.  The patient denies shortness of breath at rest, coughing up blood, chest pain, irregular heartbeats, acid heartburn,  indigestion, loss of appetite, weight change, abdominal pain, difficulty swallowing, sore throat, tooth/dental problems, headaches, nasal congestion/difficulty breathing through nose, sneezing, itching, ear ache, anxiety, depression, hand/feet swelling, joint stiffness or pain, rash, change in color of mucus, and fever.    Vital Signs:  Patient profile:   70 year old female Height:      64 inches Weight:      180 pounds BMI:     31.01 O2 Sat:      96 % on Room air Temp:     98.1 degrees F oral Pulse rate:   112 / minute BP sitting:   124 / 80  (left arm) Cuff size:   regular  Vitals Entered By: Gweneth Dimitri RN (Aug 14, 2009 3:10 PM)  O2 Flow:  Room air CC: 1 month follow up.  Pt states she continues to have SOB with activity, chest congestion, wheezing, chest tightness and cough with "very little" white mucus.  States these sxs are the same since last ov-no better or worse.  States she was unable to do 2 puffs bid on dulera because it icreased BP.  currently taking dulera 1 puff qd.   Comments Medications reviewed with patient Daytime contact number verified with patient. Gweneth Dimitri RN  Aug 14, 2009 3:13 PM    Physical Exam  Additional Exam:  Gen: WD WN    WF      in NAD    NCAT Heent:  no jvd, no TMG, no cervical LNademopathy, orophyx clear,  nares with clear watery drainage. 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   plan cont  dulera 200 stn one  puff two times a day  with spacer trial zyflo 1200mg  two times a day ,  samples given rov one month  Medications Added to Medication List This Visit: 1)  Dulera 200-5 Mcg/act Aero (Mometasone furo-formoterol fum) .Marland Kitchen.. 1 puff once per day failed symbicort, advair 2)  Zyflo Cr 600 Mg Xr12h-tab (Zileuton) .... Two by mouth two times a day 3)  Aerochamber Mv Misc (Spacer/aero-holding chambers) .... Use with dulera  Complete Medication List: 1)  Multivitamins Tabs (Multiple vitamin) .Marland Kitchen.. 1 by mouth daily 2)  B Complete Tabs (B complex-biotin-fa) .Marland Kitchen.. 1 by mouth daily 3)  Benicar Hct 40-12.5 Mg Tabs (Olmesartan medoxomil-hctz) .Marland Kitchen.. 1 by mouth daily 4)  Aspirin 325 Mg Tabs (Aspirin) .... Once daily  5)  Allegra 180 Mg Tabs (Fexofenadine hcl) .Marland Kitchen.. 1 by mouth daily 6)  Furosemide 40 Mg Tabs (Furosemide) .... Using as needed 7)  Metformin Hcl 500 Mg Tabs (Metformin hcl) .Marland Kitchen.. 1 by mouth daily 8)  Coq10 100 Mg Caps (Coenzyme q10) .Marland Kitchen.. 1 by mouth daily 9)  Proair Hfa 108 (90 Base) Mcg/act Aers (Albuterol sulfate) .... 2 puffs every 4-6 hr as needed wheezing/shortness of breath 10)  Dulera 200-5 Mcg/act Aero (Mometasone furo-formoterol fum) .Marland Kitchen.. 1 puff once per day failed symbicort, advair 11)  Pravastatin Sodium 40 Mg Tabs (Pravastatin sodium) .Marland Kitchen.. 1 by mouth daily 12)  Bystolic 2.5 Mg Tabs (Nebivolol hcl) .Marland Kitchen.. 1 by mouth daily 13)  Mucinex 600 Mg Xr12h-tab (Guaifenesin) .... 2-3 times daily 14)  Lovaza 1 Gm Caps (Omega-3-acid ethyl esters) .... Once daily 15)  Tums 500 Mg Chew (Calcium carbonate antacid) .... As needed 16)  Glucosamine 500 Mg Caps (Glucosamine sulfate) .... Two times a day 17)  Vitamin D  .... Once daily 18)  Zyflo Cr 600 Mg Xr12h-tab (Zileuton) .... Two by mouth two times a day 19)  Aerochamber Mv Misc (Spacer/aero-holding chambers) .... Use with dulera  Patient Instructions: 1)  Start Zyflo two twice daily 2)  Stay on Dulera one puff daily use spacer and try to go to two puff daily with the spacer 3)  No other medication change 4)  Return 1  month Prescriptions: AEROCHAMBER MV  MISC (SPACER/AERO-HOLDING CHAMBERS) Use with Dulera  #1 x 0   Entered and Authorized by:   Storm Frisk MD   Signed by:   Storm Frisk MD on 08/14/2009   Method used:   Print then Give to Patient   RxID:   [id number] ZYFLO CR 600 MG XR12H-TAB (ZILEUTON) Two by mouth two times a day  #1 month x 6   Entered and Authorized by:   Storm Frisk MD   Signed by:   Storm Frisk MD on 08/14/2009   Method used:   Print then Give to Patient   RxID:   [id number]

## 2010-05-02 NOTE — Progress Notes (Signed)
Summary: Triage  Phone Note Other Incoming   Caller: Malachi Bonds @ [facility] [phone] Summary of Call: pt is having change in BM, diarrhea...requesting pt. see Dr. Marina Goodell and sooner than next avail. Initial call taken by: Karna Christmas,  October 24, 2009 12:50 PM  Follow-up for Phone Call        She will fax records for Dr.Tongela Encinas's review. Follow-up by: Teryl Lucy RN,  October 24, 2009 2:36 PM

## 2010-05-02 NOTE — Assessment & Plan Note (Signed)
Summary: Pulmonary Acute OV   Primary Provider/Referring Provider:  Dr. Harvie Heck  CC:  Acute visit.  Pt c/o chest congestion and prod cough with green sputum x 5 days.  She started to noticed some wheezing and chest tightness last night.  Denies fever but c/o chills..  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 Verdis Prime.   February 28, 2008 --- More dyspnea and cough past week.  Coughing more green mucous.  No dyspnea.  Pt on xopenex and qvar which helped.  Had side effects with symbicort.  No post nasal drip.   Pt denies any significant sore throat, nasal congestion, fever, chills, sweats, unintended weight loss, pleurtic or exertional chest pain, orthopnea PND, or leg swelling  May 03, 2008 2:48 PM  At last ov rx was: A depo medrol injection will be given Take prednisone as prescribed last OV Get doxycycline filled increase Qvar to 4 puffs twice a day for 10days then reduce to two puffs twice daily   Pt got better after 11/09 rx.  Now more congested and dyspneic.  Cough is productive of white and yellow green mucous.  No chest pain.  Feels irritated in airways.  No nasal discharge. January 02, 2009 11:08 AM Last ov 2/10 rx last ov 1)  A depo medrol injection will be given 2)  Avelox one daily until gone 3)  Trial Foradil one capsule twice daily  stop if get rapid heart rate and call me  ill in [place] last month and rx abx avelox and no better noting more wheezing,  chest feels raw,  green mucous,  more dyspnea.   notes pndrip,  gets ill at turn of weather  January 24, 2009 3:06 PM The pt is less congested and still feel sob,  foradil caused HTN and HR increase The prednisone causes side effects. There is fluid retention  issues with pred The pt is still dyspneic The mucous catches in the airway  April 05, 2009--Acute visit.  Pt c/o chest congestion and prod cough with green sputum x 5 days.  She started to noticed some wheezing and chest tightness last night.  Denies fever but c/o chills. Requests changing xopenex inahaler to proair due to cost issues with insurance.  OTC not helping. Denies chest pain, dyspnea, orthopnea, hemoptysis, fever, n/v/d, edema, headache.   Preventive Screening-Counseling & Management  Alcohol-Tobacco     Smoking Status: quit > 6 months  Current Medications (verified): 1)  Multivitamins   Tabs (Multiple Vitamin) .Marland Kitchen.. 1 By Mouth Daily 2)  B Complete   Tabs (B Complex-Biotin-Fa) .Marland Kitchen.. 1 By Mouth Daily 3)  Benicar Hct 40-12.5 Mg  Tabs (Olmesartan Medoxomil-Hctz) .Marland Kitchen.. 1 By Mouth Daily 4)  Aspirin 325 Mg Tabs (Aspirin) .... Once Daily 5)  Allegra 180 Mg  Tabs (Fexofenadine Hcl) .Marland Kitchen.. 1 By Mouth Daily 6)  Furosemide 40 Mg  Tabs (Furosemide) .... Using As Needed 7)  Mucinex 600 Mg Xr12h-Tab (Guaifenesin) .... Take Two By Mouth Two Times A Day As Needed 8)  Metformin Hcl 500 Mg  Tabs (Metformin Hcl) .Marland Kitchen.. 1 By Mouth Daily 9)  Ambien Cr 12.5 Mg  Tbcr (Zolpidem Tartrate) .... As Needed 10)  Coq10 100 Mg  Caps (Coenzyme  Q10) .... 1 By Mouth Daily 11)  Xopenex Hfa 45 Mcg/act  Aero (Levalbuterol Tartrate) .... 2 Puffs Every 4 Hours As Needed For Rescue 12)  Advair Hfa 115-21 Mcg/act  Aero (Fluticasone-Salmeterol) .... Two Puffs Twice Daily 13)  Pravastatin Sodium 40 Mg Tabs (Pravastatin Sodium) .Marland Kitchen.. 1 By Mouth Daily 14)  Bystolic 2.5 Mg Tabs (Nebivolol Hcl) .Marland Kitchen.. 1 By Mouth Daily  Allergies (verified): 1)  ! Keflex 2)  ! Ceftin  Past History:  Past Medical History: Last updated: 02/11/2008 ASTHMA, PERSISTENT, MODERATE (ICD-493.90)    -FeV1 96%  DLCO 82% 8/09 HYPERTENSION NEC (ICD-997.91) PAROXYSMAL TACHYCARDIA (ICD-427.2) GERD, SEVERE (ICD-530.81) DIABETES, TYPE 2  (ICD-250.00) RHINITIS (ICD-472.0) HYPERTENSION (ICD-401.9) HYPERLIPIDEMIA (ICD-272.4)  Past Surgical History: Last updated: 09/13/2007 Hysterectomy  Family History: Last updated: 09/14/2007 Mother--allergies Prostate cancer--MGF  Social History: Last updated: 09/14/2007 Patient states former smoker.  Retired Charity fundraiser. Widowed.  Risk Factors: Smoking Status: quit > 6 months (04/05/2009)  Social History: Smoking Status:  quit > 6 months  Review of Systems      See HPI  Vital Signs:  Patient profile:   70 year old female Weight:      183 pounds O2 Sat:      98 % on Room air Temp:     98.6 degrees F oral Pulse rate:   97 / minute BP sitting:   122 / 76  (left arm)  Vitals Entered By: Vernie Murders (April 05, 2009 11:06 AM)  O2 Flow:  Room air CC: Acute visit.  Pt c/o chest congestion and prod cough with green sputum x 5 days.  She started to noticed some wheezing and chest tightness last night.  Denies fever but c/o chills. Is Patient Diabetic? Yes   Physical Exam  Additional Exam:  Gen: WD WN    WF      in NAD    NCAT Heent:  no jvd, no TMG, no cervical LNademopathy, orophyx clear,  nares with clear watery drainage. Cor: RRR nl s1/s2  no s3/s4  no m r h g Abd: soft NT BSA   no masses  No HSM  no rebound or guarding Ext perfused with no c v e v.d Neuro: intact, moves all 4s, CN II-XII intact, DTRs intact Chest: coarse BS w/ no wheezing Skin: clear  Genital/Rectal :deferred    Impression & Recommendations:  Problem # 1:  ACUTE BRONCHITIS (ICD-466.0)  Exacerbation REC:  Levaquin 750mg  once daily for 7 days Mucinex DM two times a day as needed cough/congestion Please contact office for sooner follow up if symptoms do not improve or worsen  follow up Dr. Delford Field in 4 weeks  Her updated medication list for this problem includes:    Mucinex 600 Mg Xr12h-tab (Guaifenesin) .Marland Kitchen... Take two by mouth two times a day as needed    Proair Hfa 108 (90 Base) Mcg/act Aers  (Albuterol sulfate) .Marland Kitchen... 2 puffs every 4-6 hr as needed wheezing/shortness of breath    Advair Hfa 115-21 Mcg/act Aero (Fluticasone-salmeterol) .Marland Kitchen..Marland Kitchen Two puffs twice daily    Levaquin 750 Mg Tabs (Levofloxacin) .Marland Kitchen... 1 by mouth once daily  Orders: Est. Patient Level IV (16109)  Medications Added to Medication List This Visit: 1)  Proair Hfa 108 (90 Base) Mcg/act Aers (Albuterol sulfate) .... 2 puffs every 4-6 hr as needed wheezing/shortness of breath 2)  Levaquin 750 Mg Tabs (Levofloxacin) .Marland Kitchen.. 1 by mouth once daily  Complete Medication List: 1)  Multivitamins Tabs (Multiple vitamin) .Marland Kitchen.. 1 by mouth daily  2)  B Complete Tabs (B complex-biotin-fa) .Marland Kitchen.. 1 by mouth daily 3)  Benicar Hct 40-12.5 Mg Tabs (Olmesartan medoxomil-hctz) .Marland Kitchen.. 1 by mouth daily 4)  Aspirin 325 Mg Tabs (Aspirin) .... Once daily 5)  Allegra 180 Mg Tabs (Fexofenadine hcl) .Marland Kitchen.. 1 by mouth daily 6)  Furosemide 40 Mg Tabs (Furosemide) .... Using as needed 7)  Mucinex 600 Mg Xr12h-tab (Guaifenesin) .... Take two by mouth two times a day as needed 8)  Metformin Hcl 500 Mg Tabs (Metformin hcl) .Marland Kitchen.. 1 by mouth daily 9)  Ambien Cr 12.5 Mg Tbcr (Zolpidem tartrate) .... As needed 10)  Coq10 100 Mg Caps (Coenzyme q10) .Marland Kitchen.. 1 by mouth daily 11)  Proair Hfa 108 (90 Base) Mcg/act Aers (Albuterol sulfate) .... 2 puffs every 4-6 hr as needed wheezing/shortness of breath 12)  Advair Hfa 115-21 Mcg/act Aero (Fluticasone-salmeterol) .... Two puffs twice daily 13)  Pravastatin Sodium 40 Mg Tabs (Pravastatin sodium) .Marland Kitchen.. 1 by mouth daily 14)  Bystolic 2.5 Mg Tabs (Nebivolol hcl) .Marland Kitchen.. 1 by mouth daily 15)  Levaquin 750 Mg Tabs (Levofloxacin) .Marland Kitchen.. 1 by mouth once daily  Patient Instructions: 1)  Levaquin 750mg  once daily for 7 days 2)  Mucinex DM two times a day as needed cough/congestion 3)  Please contact office for sooner follow up if symptoms do not improve or worsen  4)  follow up Dr. Delford Field in 4 weeks  Prescriptions: AMBIEN CR  12.5 MG  TBCR (ZOLPIDEM TARTRATE) as needed  #10 x 0   Entered and Authorized by:   Rubye Oaks NP   Signed by:   Nakkia Mackiewicz NP on 04/05/2009   Method used:   Print then Give to Patient   RxID:   [id number] PROAIR HFA 108 (90 BASE) MCG/ACT AERS (ALBUTEROL SULFATE) 2 puffs every 4-6 hr as needed wheezing/shortness of breath  #1 x 3   Entered and Authorized by:   Rubye Oaks NP   Signed by:   Rubye Oaks NP on 04/05/2009   Method used:   Electronically to        [facility]. [phone]* (retail)       [address]       [place], [place]  [address]       Ph: [phone]       Fax: [phone]   RxID:   [id number] LEVAQUIN 750 MG TABS (LEVOFLOXACIN) 1 by mouth once daily  #7 x 0   Entered and Authorized by:   Rubye Oaks NP   Signed by:   Rubye Oaks NP on 04/05/2009   Method used:   Electronically to        [facility]. [phone]* (retail)       [address]       [place], [place]  [address]       Ph: [phone]       Fax: [phone]   RxID:   [id number]   Appended Document: Pulmonary Acute OV I agree with this plan of care pw

## 2010-05-02 NOTE — Letter (Signed)
Summary: [facility]  [facility]   Imported By: Lamona Curl CMA (AAMA) 11/19/2009 08:03:27  _____________________________________________________________________  External Attachment:    Type:   Image     Comment:   External Document

## 2010-05-02 NOTE — Miscellaneous (Signed)
Summary: Zolpidem changed to Lunesta  Clinical Lists Changes  Medications: Changed medication from AMBIEN CR 12.5 MG  TBCR (ZOLPIDEM TARTRATE) as needed to LUNESTA 3 MG TABS (ESZOPICLONE) at bedtime as needed - Signed Rx of LUNESTA 3 MG TABS (ESZOPICLONE) at bedtime as needed;  #10 x 0;  Signed;  Entered by: Gweneth Dimitri RN;  Authorized by: Storm Frisk MD;  Method used: Historical    Prescriptions: LUNESTA 3 MG TABS (ESZOPICLONE) at bedtime as needed  #10 x 0   Entered by:   Gweneth Dimitri RN   Authorized by:   Storm Frisk MD   Signed by:   Gweneth Dimitri RN on 06/05/2009   Method used:   Historical   RxID:   [id number]   Received Request from [facility] to stating lunesta is prefered over zolpidem.  Ok Per PW to change zolpidem to Lunesta 3mg  at bedtime as needed.  Med changed in EMR and form faxed back to [facility].

## 2010-05-02 NOTE — Assessment & Plan Note (Signed)
Summary: Pulmonary OV   Primary Provider/Referring Provider:  Dr. Harvie Heck  CC:  Asthma Follow up.  Pt states she is having chest congestion, prod cough with white mucus, increased SOB with activity, and wheezing and chest tightness.  Pt states these symptoms have been "up and down since January.".  History of Present Illness:  70  YO Female with COPD/AB          April 05, 2009--Acute visit.  Pt c/o chest congestion and prod cough with green sputum x 5 days.  She started to noticed some wheezing and chest tightness last night.  Denies fever but c/o chills. Requests changing xopenex inahaler to proair due to cost issues with insurance.  OTC not helping. Denies chest pain, dyspnea, orthopnea, hemoptysis, fever, n/v/d, edema, headache.   July 02, 2009 4:56 PM Was seen by NP 1/11 and rx levaquin/mucinex DM since last ov.  took three different rounds of ABX and finally clear now issue with pollen, stays inside,  notes some pn drip . Stays congested in the chest ,  hard time coughing up mucous. recently mucous is white, not discolored Is dyspneic with exertion. Notes chest tightness and pressure on R side of chest     Preventive Screening-Counseling & Management  Alcohol-Tobacco     Year Quit: 2000     Pack years: 20  Current Medications (verified): 1)  Multivitamins   Tabs (Multiple Vitamin) .Marland Kitchen.. 1 By Mouth Daily 2)  B Complete   Tabs (B Complex-Biotin-Fa) .Marland Kitchen.. 1 By Mouth Daily 3)  Benicar Hct 40-12.5 Mg  Tabs (Olmesartan Medoxomil-Hctz) .Marland Kitchen.. 1 By Mouth Daily 4)  Aspirin 325 Mg Tabs (Aspirin) .... Once Daily 5)  Allegra 180 Mg  Tabs (Fexofenadine Hcl) .Marland Kitchen.. 1 By Mouth Daily 6)  Furosemide 40 Mg  Tabs (Furosemide) .... Using As Needed 7)  Metformin Hcl 500 Mg  Tabs (Metformin Hcl) .Marland Kitchen.. 1 By Mouth Daily 8)  Lunesta 3 Mg Tabs (Eszopiclone) .... At Bedtime As Needed 9)  Coq10 100 Mg  Caps (Coenzyme Q10) .Marland Kitchen.. 1 By Mouth Daily 10)  Proair Hfa 108 (90 Base) Mcg/act Aers  (Albuterol Sulfate) .... 2 Puffs Every 4-6 Hr As Needed Wheezing/shortness of Breath 11)  Advair Hfa 115-21 Mcg/act  Aero (Fluticasone-Salmeterol) .... Two Puffs Twice Daily 12)  Pravastatin Sodium 40 Mg Tabs (Pravastatin Sodium) .Marland Kitchen.. 1 By Mouth Daily 13)  Bystolic 2.5 Mg Tabs (Nebivolol Hcl) .Marland Kitchen.. 1 By Mouth Daily 14)  Mucinex 600 Mg Xr12h-Tab (Guaifenesin) .... 2-3 Times Daily 15)  Lovaza 1 Gm Caps (Omega-3-Acid Ethyl Esters) .... Once Daily 16)  Tums 500 Mg Chew (Calcium Carbonate Antacid) .... As Needed 17)  Glucosamine 500 Mg Caps (Glucosamine Sulfate) .... Two Times A Day 18)  Vitamin D .... Once Daily  Allergies (verified): 1)  ! Keflex 2)  ! Ceftin  Past History:  Past medical, surgical, family and social histories (including risk factors) reviewed, and no changes noted (except as noted below).  Past Medical History: Reviewed history from 02/11/2008 and no changes required. 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 Hysterectomy  Family History: Reviewed history from 09/14/2007 and no changes required. Mother--allergies Prostate cancer--MGF  Social History: Reviewed history from 09/14/2007 and no changes required. Patient states former smoker.  Retired Char[employer] fundraiser. Widowed.  Pack years:  20  Review of Systems       The patient complains of shortness of breath with activity, shortness of breath at rest, productive cough, non-productive cough, acid heartburn, indigestion, and nasal congestion/difficulty breathing through nose.  The patient denies coughing up blood, chest pain, irregular heartbeats, loss of appetite, weight change, abdominal pain, difficulty swallowing, sore  throat, tooth/dental problems, headaches, sneezing, itching, ear ache, anxiety, depression, hand/feet swelling, joint stiffness or pain, rash, change in color of mucus, and fever.    Vital Signs:  Patient profile:   70 year old female Height:      63.5 inches Weight:      181.38 pounds BMI:     31.74 O2 Sat:      94 % on Room air Temp:     97.6 degrees F oral Pulse rate:   96 / minute BP sitting:   134 / 78  (left arm) Cuff size:   regular  Vitals Entered By: Gweneth Dimitri RN (July 02, 2009 4:39 PM)  O2 Flow:  Room air CC: Asthma Follow up.  Pt states she is having chest congestion, prod cough with white mucus, increased SOB with activity,  wheezing and chest tightness.  Pt states these symptoms have been "up and down since January." Comments Medications reviewed with patient Daytime contact number verified with patient. Gweneth Dimitri RN  July 02, 2009 4:39 PM    Physical Exam  Additional Exam:  Gen: WD WN    WF      in NAD    NCAT Heent:  no jvd, no TMG, no cervical LNademopathy, orophyx clear,  nares with clear watery drainage. Cor: RRR nl s1/s2  no s3/s4  no m r h g Abd: soft NT BSA   no masses  No HSM  no rebound or guarding Ext perfused with no c v e v.d Neuro: intact, moves all 4s, CN II-XII intact, DTRs intact Chest: coarse BS w/ no wheezing Skin: clear  Genital/Rectal :deferred    Impression & Recommendations:  Problem # 1:  ASTHMA, PERSISTENT, MODERATE (ICD-493.90) Assessment Deteriorated  Moderate persistent asthma, pt using foradil as needed now,  likely atopic features, pt desires allergy eval plan trial dulera 200 stn two puff two times a day  stop advair depomedrol 120mg  IM  Medications Added to Medication List This Visit: 1)  Dulera 200-5 Mcg/act Aero (Mometasone furo-formoterol fum) .... 2 puffs twice per day failed symbicort, advair 2)  Mucinex 600 Mg Xr12h-tab (Guaifenesin) .... 2-3 times daily 3)  Lovaza 1 Gm Caps (Omega-3-acid ethyl esters)  .... Once daily 4)  Tums 500 Mg Chew (Calcium carbonate antacid) .... As needed 5)  Glucosamine 500 Mg Caps (Glucosamine sulfate) .... Two times a day 6)  Vitamin D  .... Once daily  Complete Medication List: 1)  Multivitamins Tabs (Multiple vitamin) .Marland Kitchen.. 1 by mouth daily 2)  B Complete Tabs (B complex-biotin-fa) .Marland Kitchen.. 1 by mouth daily 3)  Benicar Hct 40-12.5 Mg Tabs (Olmesartan medoxomil-hctz) .Marland Kitchen.. 1 by mouth daily 4)  Aspirin 325 Mg Tabs (Aspirin) .... Once daily 5)  Allegra 180 Mg Tabs (Fexofenadine hcl) .Marland Kitchen.. 1 by mouth daily 6)  Furosemide 40 Mg Tabs (Furosemide) .... Using as needed 7)  Metformin Hcl 500 Mg Tabs (Metformin hcl) .Marland Kitchen.. 1 by mouth daily 8)  Coq10 100 Mg Caps (Coenzyme q10) .Marland Kitchen.. 1 by mouth daily 9)  Proair Hfa 108 (90 Base) Mcg/act Aers (Albuterol sulfate) .... 2 puffs every 4-6 hr  as needed wheezing/shortness of breath 10)  Dulera 200-5 Mcg/act Aero (Mometasone furo-formoterol fum) .... 2 puffs twice per day failed symbicort, advair 11)  Pravastatin Sodium 40 Mg Tabs (Pravastatin sodium) .Marland Kitchen.. 1 by mouth daily 12)  Bystolic 2.5 Mg Tabs (Nebivolol hcl) .Marland Kitchen.. 1 by mouth daily 13)  Mucinex 600 Mg Xr12h-tab (Guaifenesin) .... 2-3 times daily 14)  Lovaza 1 Gm Caps (Omega-3-acid ethyl esters) .... Once daily 15)  Tums 500 Mg Chew (Calcium carbonate antacid) .... As needed 16)  Glucosamine 500 Mg Caps (Glucosamine sulfate) .... Two times a day 17)  Vitamin D  .... Once daily  Other Orders: Est. Patient Level IV (45409) Depo- Medrol 80mg  (J1040) Depo- Medrol 40mg  (J1030) Admin of Therapeutic Inj  intramuscular or subcutaneous (81191)  Patient Instructions: 1)  A depomedrol injection 120mg  IM will be given 2)  Trial Dulera 200  two puff twice daily 3)  Stop Advair while on Dulera 4)  Return 1 month  Prescriptions: DULERA 200-5 MCG/ACT AERO (MOMETASONE FURO-FORMOTEROL FUM) 2 puffs twice per day Failed symbicort, advair  #1 x 6   Entered and Authorized by:   Storm Frisk  MD   Signed by:   Storm Frisk MD on 07/02/2009   Method used:   Print then Give to Patient   RxID:   [id number]      Medication Administration  Injection # 1:    Medication: Depo- Medrol 80mg     Diagnosis: ACUTE BRONCHITIS (ICD-466.0)    Route: IM    Site: LUOQ gluteus    Exp Date: 11/30/2011    Lot #: [id number]    Mfr: Pharm[employer]    Patient tolerated injection without complications    Given by: Vernie Murders (July 02, 2009 5:19 PM)  Injection # 2:    Medication: Depo- Medrol 40mg     Diagnosis: ACUTE BRONCHITIS (ICD-466.0)    Route: IM    Site: LUOQ gluteus    Exp Date: 11/30/2011    Lot #: [id number]    Mfr: [employer]    Patient tolerated injection without complications    Given by: Vernie Murders (July 02, 2009 5:19 PM)  Orders Added: 1)  Est. Patient Level IV [46962] 2)  Depo- Medrol 80mg  [J1040] 3)  Depo- Medrol 40mg  [J1030] 4)  Admin of Therapeutic Inj  intramuscular or subcutaneous [95284]

## 2010-05-02 NOTE — Medication Information (Signed)
Summary: Prior Auth for Lunesta/[facility]   Prior Auth for Lunesta/[facility]   Imported By: Sherian Rein 06/07/2009 08:05:27  _____________________________________________________________________  External Attachment:    Type:   Image     Comment:   External Document

## 2010-05-07 ENCOUNTER — Ambulatory Visit: Payer: Self-pay | Admitting: Critical Care Medicine

## 2010-05-09 NOTE — Procedures (Signed)
NAMECASSUNDRA, MCKEEVER NO.:  [id number]  MEDICAL RECORD NO.:  [id number]          PATIENT TYPE:  OIB  LOCATION:  1962                         FACILITY:  [facility]  PHYSICIAN:  Lyn Records, M.D.   DATE OF BIRTH:  January 03, 1941  DATE OF PROCEDURE:  04/11/2010 DATE OF DISCHARGE:  04/11/2010                           CARDIAC CATHETERIZATION   __________  INDICATION:  __________ brief history of recurrent chest tightness, dyspnea.  The patient has a history of coronary disease with overlapping drug-eluting stents to the right coronary in September 2009. __________.  Recent Cardiolite stress test __________.  PROCEDURES PERFORMED:  __________  After informed consent, 4-French sheath was placed in the right femoral artery using a modified Seldinger technique.  A 4-French A2 multipurpose catheter.  Hemodynamic recordings, left ventriculography by hand injection, and selective bypass graft angiography.  We then performed left coronary angiography using a 4-French #4 left Judkins catheter. The patient tolerated this procedure without clinical complications. Manual compression was used for hemostasis.  RESULTS: 1. Hemodynamic data:     a.     Aortic pressure 100/50 mmHg.     b.     Left ventricular pressure 106/77 mmHg. 2. Left ventriculography:  The LV cavity size is normal.  There is     vigorous __________.  EF was 75-80%. 3. Coronary angiography:     a.     Left main coronary:  Relatively short, patent, with      irregularities noted distally.     b.     Left anterior descending coronary:  The left anterior      descending coronary artery is a large vessel that branches into a      continuation branch at the AV groove and a large first diagonal.      Irregularities are noted both proximal and distal to the      bifurcation.  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  The septal perforator is free of      obstruction.   Another branch arises from this saccular aneurysm      and has a course of a first obtuse marginal branch.  This area of      saccular aneurysm off the proximal LAD is unchanged from the      catheterization performed during her acute intervention in      September 2009.     c.     Circumflex artery:  Circumflex coronary artery  contains a      mid vessel, fusiform ectasia/aneurysm.  Each of three moderate      sized obtuse marginal branches are widely patent.  Each obtuse      marginal branch is tortuous.  The proximal circumflex before the      fusiform area of ectasia/aneurysm contains 30-40% narrowing.     d.     Right coronary:  The right coronary is dominant, is heavily      calcified.  A long region of overlapping stent is noted in the mid      vessel.  The vessel  was very tortuous.  The regularities are noted      throughout the proximal mid vessel.  The region of stent down      beyond the first severe bend in this vessel is widely patent with      perhaps 20% drug-eluting stent restenosis.  A large PDA and      bifurcating left ventricular branch arises distally.  No      suggestion of obstruction is noted in the RCA.  CONCLUSIONS: 1. 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  Luminal irregularities are noted in the LAD and     circumflex.  There is a mid circumflex fusiform ectasia/aneurysm. 2. Widely patent RCA with patent midvessel stents within a very     tortuous and calcified vessel. 3. Normal LV function with __________ cavity near-obliteration.  PLAN:  In the absence of any evidence of significant high-grade disease, medical should be continued.     Lyn Records, M.D.     HWS/MEDQ  D:  04/11/2010  T:  04/12/2010  Job:  [id number]  Electronically Signed by Verdis Prime M.D. on 05/09/2010 04:57:45 PM

## 2010-06-14 ENCOUNTER — Ambulatory Visit (INDEPENDENT_AMBULATORY_CARE_PROVIDER_SITE_OTHER): Payer: Medicare Other | Admitting: Critical Care Medicine

## 2010-06-14 ENCOUNTER — Encounter: Payer: Self-pay | Admitting: Critical Care Medicine

## 2010-06-14 DIAGNOSIS — J45909 Unspecified asthma, uncomplicated: Secondary | ICD-10-CM

## 2010-06-14 DIAGNOSIS — J209 Acute bronchitis, unspecified: Secondary | ICD-10-CM

## 2010-06-17 ENCOUNTER — Telehealth (INDEPENDENT_AMBULATORY_CARE_PROVIDER_SITE_OTHER): Payer: Self-pay | Admitting: *Deleted

## 2010-06-17 DIAGNOSIS — G4700 Insomnia, unspecified: Secondary | ICD-10-CM | POA: Insufficient documentation

## 2010-06-27 NOTE — Assessment & Plan Note (Signed)
Summary: Pulmonary OV   Copy to:  na Primary Provider/Referring Provider:  Larina Earthly, MD   CC:  Follow up.  increased SOB when walking, chest congestion, prod cough with yellow mucus, wheezing, and chest tightness x 2 wks.  .  History of Present Illness:  70  YO Female with moderate persistent asthma        October 10, 2009 11:36 AM The pt is back on symbicort and off dulera and doing well.  No new issues. Is off zyflo now Pt denies any significant sore throat, nasal congestion or excess secretions, fever, chills, sweats, unintended weight loss, pleurtic or exertional chest pain, orthopnea PND, or leg swelling Pt denies any increase in rescue therapy over baseline, denies waking up needing it or having any early am or nocturnal exacerbations of coughing/wheezing/or dyspnea.  June 14, 2010 2:18 PM More dyspnea now.   Had diarrhea for 6 months,  dx per GI? worse with the pollen,  symbicort has helped,  but more congested.  Now is productive yellow,  notes pn drip.  Notes some sinus pressure, notes more wheezing.    Asthma History    Asthma Control Assessment:    Age range: 12+ years    Symptoms: >2 days/week    Nighttime Awakenings: 0-2/month    Interferes w/ normal activity: some limitations    SABA use (not for EIB): 0-2 days/week    ATAQ questionnaire: 0    FEV1: 4.85 liters (today)    FEV1 Pred: 2.25 liters (today)    Exacerbations requiring oral systemic steroids: 0-1/year    Asthma Control Assessment: Not Well Controlled   Current Medications (verified): 1)  Multivitamins   Tabs (Multiple Vitamin) .Marland Kitchen.. 1 By Mouth Daily 2)  B Complete   Tabs (B Complex-Biotin-Fa) .Marland Kitchen.. 1 By Mouth Daily 3)  Aspirin 81 Mg Tbec (Aspirin) .... Take 1 Tablet By Mouth Once A Day 4)  Allegra 180 Mg  Tabs (Fexofenadine Hcl) .Marland Kitchen.. 1 By Mouth Daily 5)  Furosemide 40 Mg  Tabs (Furosemide) .... Using As Needed 6)  Metformin Hcl 500 Mg  Tabs (Metformin Hcl) .Marland Kitchen.. 1 By Mouth Daily 7)  Coq10 100 Mg  Caps  (Coenzyme Q10) .Marland Kitchen.. 1 By Mouth Daily 8)  Proair Hfa 108 (90 Base) Mcg/act Aers (Albuterol Sulfate) .... 2 Puffs Every 4-6 Hr As Needed Wheezing/shortness of Breath 9)  Symbicort 80-4.5 Mcg/act  Aero (Budesonide-Formoterol Fumarate) .... One To Two Puffs Twice Daily 10)  Pravastatin Sodium 40 Mg Tabs (Pravastatin Sodium) .Marland Kitchen.. 1 By Mouth Daily 11)  Bystolic 5 Mg Tabs (Nebivolol Hcl) .... Take 1 Tablet By Mouth Once A Day 12)  Mucinex 600 Mg Xr12h-Tab (Guaifenesin) .... 2-3 Times Daily 13)  Lovaza 1 Gm Caps (Omega-3-Acid Ethyl Esters) .... Once Daily 14)  Tums 500 Mg Chew (Calcium Carbonate Antacid) .... As Needed 15)  Glucosamine 500 Mg Caps (Glucosamine Sulfate) .... Two Times A Day 16)  Vitamin D 1000 Unit Tabs (Cholecalciferol) .... Take 1 Tablet By Mouth Once A Day 17)  Aerochamber Mv  Misc (Spacer/aero-Holding Chambers) .... Use With Symbicort 18)  Melatonin-Pyridoxine 5-10 Mg Tabs (Melatonin-Pyridoxine) .... One Tablet By Mouth Once Daily As Needed 19)  Unisom 25 Mg Tabs (Doxylamine Succinate (Sleep)) .... As Needed 20)  Imodium A-D 2 Mg Tabs (Loperamide Hcl) .... As Needed  Allergies (verified): 1)  ! Keflex 2)  ! Ceftin 3)  ! * Clarithyromycin 4)  ! * Shellfish  Past History:  Past medical, surgical, family and social histories (  including risk factors) reviewed, and no changes noted (except as noted below).  Past Medical History: ASTHMA, PERSISTENT, MODERATE (ICD-493.90)    -FeV1 96%  DLCO 82% 8/09 HYPERTENSION NEC (ICD-997.91) PAROXYSMAL TACHYCARDIA (ICD-427.2) GERD, SEVERE (ICD-530.81) DIABETES, TYPE 2 (ICD-250.00) RHINITIS (ICD-472.0) HYPERTENSION (ICD-401.9) HYPERLIPIDEMIA (ICD-272.4) Arthritis COPD Coronary Artery Disease Diverticulosis Colon Polyps Chronic diarrhea  Past Surgical History: Reviewed history from 11/20/2009 and no changes required. Hysterectomy Tonsillectomy Angioplasty/Stent   Family History: Reviewed history from 11/20/2009 and no changes  required. Mother--allergies Prostate cancer--MGF No FH of Colon Cancer  Social History: Reviewed history from 11/20/2009 and no changes required. Retired Charity fundraiser. Widowed. Patient is a former smoker. -stopped 20 years ago Illicit Drug Use - no Patient gets regular exercise. Alcohol Use - yes: 1 daily  Daily Caffeine Use: 1 daily   Review of Systems       The patient complains of shortness of breath with activity, shortness of breath at rest, productive cough, non-productive cough, acid heartburn, indigestion, and change in color of mucus.  The patient denies coughing up blood, chest pain, irregular heartbeats, loss of appetite, weight change, abdominal pain, difficulty swallowing, sore throat, tooth/dental problems, headaches, nasal congestion/difficulty breathing through nose, sneezing, itching, ear ache, anxiety, depression, hand/feet swelling, joint stiffness or pain, rash, and fever.    Vital Signs:  Patient profile:   70 year old female Height:      64 inches Weight:      179.25 pounds BMI:     30.88 O2 Sat:      94 % on Room air Temp:     98.2 degrees F oral Pulse rate:   76 / minute BP sitting:   136 / 82  (right arm) Cuff size:   regular  Vitals Entered By: Gweneth Dimitri RN (June 14, 2010 2:09 PM)  O2 Flow:  Room air CC: Follow up.  increased SOB when walking, chest congestion, prod cough with yellow mucus, wheezing, chest tightness x 2 wks.   Comments Medications reviewed with patient Daytime contact number verified with patient. Gweneth Dimitri RN  June 14, 2010 2:09 PM    Physical Exam  Additional Exam:  Gen: WD WN    WF      in NAD    NCAT Heent:  no jvd, no TMG, no cervical LNademopathy, orophyx clear,  nares with clear watery drainage. Cor: RRR nl s1/s2  no s3/s4  no m r h g Abd: soft NT BSA   no masses  No HSM  no rebound or guarding Ext perfused with no c v e v.d Neuro: intact, moves all 4s, CN II-XII intact, DTRs intact Chest: coarse BS w/ prominent exp   wheezing Skin: clear  Genital/Rectal :deferred    Pre-Spirometry FEV1    Value: 4.85 L     Pred: 2.25 L     Impression & Recommendations:  Problem # 1:  ASTHMA, PERSISTENT, MODERATE (ICD-493.90) Assessment Deteriorated moderate persistent asthma with flare with gerd/tracheobronchitis as ppt factors plan Omeprazole one daily 1/2 hour before meals for one month and stop Doxycycline one twice daily for 7days Prednisone 10mg  Take 4 daily for two days, then 3 daily for two days, then two daily for two days then one daily for two days then stop Increase symbicort to two puff two times a day Reflux diet Return 6-8 weeks [facility]  Medications Added to Medication List This Visit: 1)  Aspirin 81 Mg Tbec (Aspirin) .... Take 1 tablet by mouth once a day 2)  Symbicort 160-4.5 Mcg/act Aero (Budesonide-formoterol fumarate) .... Two puffs twice daily 3)  Bystolic 5 Mg Tabs (Nebivolol hcl) .... Take 1 tablet by mouth once a day 4)  Vitamin D 1000 Unit Tabs (Cholecalciferol) .... Take 1 tablet by mouth once a day 5)  Aerochamber Mv Misc (Spacer/aero-holding chambers) .... Use with symbicort 6)  Melatonin-pyridoxine 5-10 Mg Tabs (Melatonin-pyridoxine) .... One tablet by mouth once daily as needed 7)  Prednisone 10 Mg Tabs (Prednisone) .... Take as directed take 4 daily for two days, then 3 daily for two days, then two daily for two days then one daily for two days then stop 8)  Doxycycline Monohydrate 100 Mg Caps (Doxycycline monohydrate) .... By mouth twice daily 9)  Omeprazole 20 Mg Cpdr (Omeprazole) .... By mouth daily. take one half hour before eating.  Complete Medication List: 1)  Multivitamins Tabs (Multiple vitamin) .Marland Kitchen.. 1 by mouth daily 2)  B Complete Tabs (B complex-biotin-fa) .Marland Kitchen.. 1 by mouth daily 3)  Aspirin 81 Mg Tbec (Aspirin) .... Take 1 tablet by mouth once a day 4)  Allegra 180 Mg Tabs (Fexofenadine hcl) .Marland Kitchen.. 1 by mouth daily 5)  Furosemide 40 Mg Tabs (Furosemide) .... Using as  needed 6)  Metformin Hcl 500 Mg Tabs (Metformin hcl) .Marland Kitchen.. 1 by mouth daily 7)  Coq10 100 Mg Caps (Coenzyme q10) .Marland Kitchen.. 1 by mouth daily 8)  Proair Hfa 108 (90 Base) Mcg/act Aers (Albuterol sulfate) .... 2 puffs every 4-6 hr as needed wheezing/shortness of breath 9)  Symbicort 160-4.5 Mcg/act Aero (Budesonide-formoterol fumarate) .... Two puffs twice daily 10)  Pravastatin Sodium 40 Mg Tabs (Pravastatin sodium) .Marland Kitchen.. 1 by mouth daily 11)  Bystolic 5 Mg Tabs (Nebivolol hcl) .... Take 1 tablet by mouth once a day 12)  Mucinex 600 Mg Xr12h-tab (Guaifenesin) .... 2-3 times daily 13)  Lovaza 1 Gm Caps (Omega-3-acid ethyl esters) .... Once daily 14)  Tums 500 Mg Chew (Calcium carbonate antacid) .... As needed 15)  Glucosamine 500 Mg Caps (Glucosamine sulfate) .... Two times a day 16)  Vitamin D 1000 Unit Tabs (Cholecalciferol) .... Take 1 tablet by mouth once a day 17)  Aerochamber Mv Misc (Spacer/aero-holding chambers) .... Use with symbicort 18)  Melatonin-pyridoxine 5-10 Mg Tabs (Melatonin-pyridoxine) .... One tablet by mouth once daily as needed 19)  Unisom 25 Mg Tabs (Doxylamine succinate (sleep)) .... As needed 20)  Prednisone 10 Mg Tabs (Prednisone) .... Take as directed take 4 daily for two days, then 3 daily for two days, then two daily for two days then one daily for two days then stop 21)  Doxycycline Monohydrate 100 Mg Caps (Doxycycline monohydrate) .... By mouth twice daily 22)  Omeprazole 20 Mg Cpdr (Omeprazole) .... By mouth daily. take one half hour before eating.  Other Orders: Est. Patient Level IV (16109)  Patient Instructions: 1)  Omeprazole one daily 1/2 hour before meals for one month and stop 2)  Doxycycline one twice daily for 7days 3)  Prednisone 10mg  Take 4 daily for two days, then 3 daily for two days, then two daily for two days then one daily for two days then stop 4)  Increase symbicort to two puff two times a day 5)  Reflux diet 6)  Return 6-8 weeks [facility]  office Prescriptions: OMEPRAZOLE 20 MG  CPDR (OMEPRAZOLE) By mouth daily. Take one half hour before eating.  #30 x 0   Entered and Authorized by:   Storm Frisk MD   Signed by:   Storm Frisk  MD on 06/14/2010   Method used:   Electronically to        [facility]. [phone]* (retail)       [address]       [place], [place]  [address]       Ph: [phone]       Fax: [phone]   RxID:   [id number] DOXYCYCLINE MONOHYDRATE 100 MG  CAPS (DOXYCYCLINE MONOHYDRATE) By mouth twice daily  #14 x 0   Entered and Authorized by:   Storm Frisk MD   Signed by:   Storm Frisk MD on 06/14/2010   Method used:   Electronically to        [facility]. [phone]* (retail)       [address]       [place], [place]  [address]       Ph: [phone]       Fax: [phone]   RxID:   [id number] PREDNISONE 10 MG  TABS (PREDNISONE) Take as directed Take 4 daily for two days, then 3 daily for two days, then two daily for two days then one daily for two days then stop  #20 x 0   Entered and Authorized by:   Storm Frisk MD   Signed by:   Storm Frisk MD on 06/14/2010   Method used:   Electronically to        [facility] [phone]* (retail)       [address]       [place], [place]  [address]       Ph: [phone]       Fax: [phone]   RxID:   [id number] SYMBICORT 160-4.5 MCG/ACT  AERO (BUDESONIDE-FORMOTEROL FUMARATE) Two puffs twice daily  #1 x 6   Entered and Authorized by:   Storm Frisk MD   Signed by:   Storm Frisk MD on 06/14/2010   Method used:   Electronically to        [facility] [phone]* (retail)       [address]       [place], [place]  [address]       Ph: [phone]       Fax: [phone]   RxID:   [id number]

## 2010-06-27 NOTE — Progress Notes (Signed)
Summary: rx sub / vomiting > change doxycycline to cipro 500mg  bid  Phone Note Call from Patient Call back at Home Phone [phone]   Caller: Patient Call For: wright Summary of Call: pt was seen 3/16. says she took doxycycline fri night and sat then vomited sat night. she says cipro works well for her. [facility] [address] Initial call taken by: Tivis Ringer, CNA,  June 17, 2010 10:21 AM  Follow-up for Phone Call        Spoke with pt.  She states that she took 2 doses of doxy after 06/14/10- started to vomit and have diarrhea.  She states that she has stopped taking this and is starting to feel better.  Would like another rx called in.  Pls advise thanks! Follow-up by: Vernie Murders,  June 17, 2010 12:35 PM  Additional Follow-up for Phone Call Additional follow up Details #1::        call in cipro 500mg  two times a day generic ok x 7days d/c doxycycline.  I put this adverse reaction in EMR Additional Follow-up by: Storm Frisk MD,  June 17, 2010 12:47 PM   New Allergies: ! DOXYCYCLINE Additional Follow-up for Phone Call Additional follow up Details #2::    called spoke with patient, advised of [facility]'s recs as stated above.  pt verbalized her understanding to stop the doxycycline and begin the cipro.  rx sent to pt's verified pharmacy. Boone Master CNA/MA  June 17, 2010 1:15 PM   New/Updated Medications: CIPROFLOXACIN HCL 500 MG TABS (CIPROFLOXACIN HCL) Take 1 tablet by mouth two times a day x7 days New Allergies: ! DOXYCYCLINEPrescriptions: CIPROFLOXACIN HCL 500 MG TABS (CIPROFLOXACIN HCL) Take 1 tablet by mouth two times a day x7 days  #14 x 0   Entered by:   Boone Master CNA/MA   Authorized by:   Storm Frisk MD   Signed by:   Boone Master CNA/MA on 06/17/2010   Method used:   Electronically to        [facility] [phone]* (retail)       [address]       [place], [place]  [address]       Ph: [phone]       Fax:  [phone]   RxID:   [id number]

## 2010-08-07 ENCOUNTER — Other Ambulatory Visit: Payer: Self-pay

## 2010-08-13 NOTE — Procedures (Signed)
CAROTID DUPLEX EXAM   INDICATION:  Followup of known carotid artery disease.  Patient is  asymptomatic.   HISTORY:  Diabetes:  Orally controlled.  Cardiac:  No.  Hypertension:  Yes.  Smoking:  No.  Previous Surgery:  No.  CV History:  Amaurosis Fugax No, Paresthesias No, Hemiparesis No                                       RIGHT             LEFT  Brachial systolic pressure:         154               152  Brachial Doppler waveforms:         WNL               WNL  Vertebral direction of flow:        Antegrade         Antegrade  DUPLEX VELOCITIES (cm/sec)  CCA peak systolic                   86                93  ECA peak systolic                   132               108  ICA peak systolic                   80                147  ICA end diastolic                   36                52  PLAQUE MORPHOLOGY:                  Heterogenous      Mixed  PLAQUE AMOUNT:                      Mild              Mild-moderate  PLAQUE LOCATION:                    ICA               ICA   IMPRESSION:  1. Right 20-39% internal carotid artery stenosis.  2. Left 40-59% internal carotid artery stenosis.  3. Study essentially unchanged from that on 09/10/06.   ___________________________________________  P. Liliane Bade, M.D.   PB/MEDQ  D:  09/16/2007  T:  09/16/2007  Job:  [id number]

## 2010-08-13 NOTE — Letter (Signed)
November 10, 2007    Talmadge Coventry, M.D.  [address]  [place], [place] [address]   RE:  Cheryl, Hernandez  MRN:  [id number]  /  DOB:  [birth year]   Dear Rise Mu:   I had the pleasure to see Cheryl Hernandez yesterday to assess her  response to Symbicort.   As you know, she is a 70 year old widowed mother of 2, I think one of  her children has died, with a longstanding history of asthma, for which  she has tried numerous therapies over the years.  Around October 02, 2007  she got sick and the pulmonologist put her on Symbicort, which she  tolerated extremely badly with a sensation of tachy palpitations and  hypertension recorded by machine is sort of the 220/110 range and her  heart rate in the 120 range.  She saw Marcelyn Bruins a couple of days  later, and the comment in your note was PAT.  I do not have any  tracings with which to confirm that.  In any case, she has a variety of  other medications has been tried and over the last few weeks, she has  gradually got back to her baseline.   In the past multiple medications have been associated with her racing  heart sensation.  They are unassociated with lightheadedness or  shortness of breath.   I should note also that concomitant with this she was given some Xanax,  which significantly improved her symptoms.   She had a cardiac evaluation in the past, which I inferred from you  note, was done by [facility].  This included an echo some point  in the last couple years that was normal and a stress Myoview, which was  also normal.   In addition to her past medical history is notable for,  1. Asthma.  2. Meniere disease.  3. Diabetes with peripheral neuropathy.  4. Arthritis.  5. GE reflux disease.  6. Constipation.   PAST SURGICAL HISTORY:  Notable for hysterectomy, tonsillectomy, and  bronchoscopy.   SOCIAL HISTORY:  She is widowed.  She is a retired Scientist, forensic.  She has 1  child  who lives in [place] and  then another in [place].  She does  not use cigarettes, alcohol, or recreational drugs.   MEDICATIONS:  Currently include  1. Benicar HCT 20/12.5.  2. Allegra.  3. Nexium.  4. Metformin 500 sounds like every other day and variety of other      nutraceutical.   ALLERGIES:  She is allergic to KEFLEX, CLARITHROMYCIN, and SHELLFISH.  Apparently she is not allergic to shellfish diet.   PAST MEDICAL HISTORY:  GENERAL:  She is an older Caucasian female  appearing in her stated age at 16.  VITAL SIGNS:  Her blood pressure is 154/85 with a pulse of 83 and with  orthostatic stress.  Her blood pressure stays stable.  Often her pulse  went up to 95.  HEENT:  Demonstrated no icterus or xanthomata.  NECK:  Neck veins were flat.  The carotids are brisk and full  bilaterally without bruits.  BACK:  Without kyphosis, scoliosis.  LUNGS:  Her lungs had prolonged expirations, but no wheezes today.  ABDOMEN:  Soft with active bowel sounds without midline pulsation or  hepatomegaly.  EXTREMITIES:  Femoral pulses were 2+.  Distal pulses were intact.  There  is no clubbing, cyanosis, or edema.  NEUROLOGIC:  Grossly normal.  SKIN:  Warm and dry.  Electrocardiogram dated today demonstrated sinus rhythm at 98 with  intervals of 0.14/0.06/0.32 with an axis of 55 degrees.  There is some  suggestion of left atrial enlargement.   She had carotid Dopplers done by Liliane Bade, however not demonstrated  mild obstruction.   IMPRESSION:  1. Asthma.  2. 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  3. Anxiety.   [name], [name] had a striking reaction to her Symbicort.  Apparently this is consistent with other reactions that she has had to  beta-agonists and certainly has covered the patient that are  hypersensitive adrenogenic stimuli.  She also has some suggestions of  this.  I was surprised a little bit after her change in heart rate, but  it was not significantly diagnostic with her  change in position to  suggest a underlying dysautonomia, although this group of patients with  postural tachycardia or have a subset of __________ that extra sensitive  to adrenogenic stimulation.   I think that long-term if this remains a problem low-dose  cardioselective beta blockers may be required to minimize these  symptoms, at the same time agonists have been used for her asthma.   If I can be of any further assistance please do not hesitate to contact  me.  Thank you very much for the consultation.    Sincerely,      Duke Salvia, MD, [facility]  Electronically Signed    SCK/MedQ  DD: 11/11/2007  DT: 11/12/2007  Job #: [id number]   CC:    Barbaraann Share, MD,FCCP

## 2010-08-13 NOTE — Procedures (Signed)
LOWER EXTREMITY ARTERIAL EVALUATION-SINGLE LEVEL   INDICATION:  Bilateral lower extremity pain.   HISTORY:  Diabetes:  Orally controlled.  Cardiac:  No.  Hypertension:  Yes.  Smoking:  No.  Previous Surgery:  No.   RESTING SYSTOLIC PRESSURES: (ABI)                          RIGHT                LEFT  Brachial:               154                  152  Anterior tibial:        164                  170  Posterior tibial:       184 (1.19)           180 (1.17)  Peroneal:  DOPPLER WAVEFORM ANALYSIS:  Anterior tibial:        Biphasic             Triphasic  Posterior tibial:       Triphasic            Triphasic  Peroneal:   PREVIOUS ABI'S:  Date:  RIGHT:  LEFT:   IMPRESSION:  Normal ankle brachial indices bilaterally.   ___________________________________________  P. Liliane Bade, M.D.   PB/MEDQ  D:  09/16/2007  T:  09/16/2007  Job:  [id number]

## 2010-08-13 NOTE — Procedures (Signed)
CAROTID DUPLEX EXAM   INDICATION:  Follow up carotid artery disease.   HISTORY:  Diabetes:  Orally controlled.  Cardiac:  No.  Hypertension:  Yes.  Smoking:  No.  Previous Surgery:  No.  CV History:  No.  Amaurosis Fugax No, Paresthesias No, Hemiparesis No.                                       RIGHT             LEFT  Brachial systolic pressure:         130               128  Brachial Doppler waveforms:         WNL               WNL  Vertebral direction of flow:        Antegrade         Antegrade  DUPLEX VELOCITIES (cm/sec)  CCA peak systolic                   103               122  ECA peak systolic                   140               128  ICA peak systolic                   81                161  ICA end diastolic                   31                54  PLAQUE MORPHOLOGY:                  Heterogenous      Heterogenous  PLAQUE AMOUNT:                      Mild              Mild  PLAQUE LOCATION:                    Bulb              ICA   IMPRESSION:  1. Right internal carotid artery suggests 20% to 39% stenosis.  2. Left internal carotid artery suggests 40%  to 59% stenosis.  3. Antegrade flow in bilateral vertebrals.   ___________________________________________  Di Kindle. Edilia Bo, M.D.   CB/MEDQ  D:  09/06/2009  T:  09/06/2009  Job:  [id number]

## 2010-08-13 NOTE — Discharge Summary (Signed)
Cheryl Hernandez, Cheryl Hernandez             ACCOUNT NO.:  [id number]   MEDICAL RECORD NO.:  [id number]          PATIENT TYPE:  INP   LOCATION:  2908                         FACILITY:  [facility]   PHYSICIAN:  Verne Carrow, MDDATE OF BIRTH:  1940/06/09   DATE OF ADMISSION:  12/10/2007  DATE OF DISCHARGE:  12/13/2007                         DISCHARGE SUMMARY - REFERRING   DISCHARGE DIAGNOSES:  1. Non-ST elevated myocardial infarction.  2. Coronary artery disease.  3. Status post drug eluting stent to the right coronary artery x2.  4. Upper respiratory infection.  5. Hypertension.  6. Hyperlipidemia with statin intolerance to Lipitor, Zocor and      Crestor.  7. Hyperglycemia with a history of diabetes, history as noted below.   PROCEDURES PERFORMED:  Cardiac catheterization with drug-eluting stent  to the RCA by Dr. Elease Hashimoto on December 11, 2007.   BRIEF HISTORY:  Cheryl Hernandez is a 70 year old white female who initially  went to [facility] and was transferred to [facility] for further evaluation of a 2-day history of shortness of  breath, cough and chest discomfort which she described as tightness or  burning associated with exertion and diaphoresis, and dizziness.   PAST MEDICAL HISTORY:  1. Hypertension.  2. Hyperlipidemia.  3. Diabetes.  4. Obesity.  5. Cerebrovascular disease.  6. GERD.  7. Asthma.  8. Anxiety.  9. Diabetic nephropathy.   ALLERGIES:  There is a history of STATIN intolerance.  According to the  patient, she has tried the medications, LIPITOR, ZOCOR and CRESTOR.  KEFLEX allergy.   LABORATORY:  Admission H&H was 13.1 and 38.8, normal indices, platelets  268, WBCs 12.5, sodium 137, potassium 3.7, BUN 15, creatinine 0.91,  glucose 155.  Normal LFTs.  Hemoglobin A1c was slightly elevated at 6.2.  Four CK totals were within normal limits.  MBs were slightly elevated  ranging from 4.2-5.9, 3 of 4 relative indexes were normal limits.  Troponins  were slightly elevated at 0.26, 0.24, 0.36 and 0.45.  Fasting  lipids on the 12th showed a total cholesterol 302, triglycerides 158.  HDL 48, LDL 222.  TSH was 1.68.  Urinalysis unremarkable.  Chest x-ray  on the 11th did not show any acute abnormalities.  Repeat on the 13th  also did not show any changes.   HOSPITAL COURSE:  The patient was admitted to [facility] to  rule out myocardial infarction.  Given her troponins it was felt that  she had a non-ST elevated myocardial infarction.  Nursing noted  occasional episodes of chest discomfort.  Given her EKG changes and  presentation she was taken to the catheterization lab early on the  morning of the 12th by Dr. Elease Hashimoto, please see dictation.  She did  receive a drug-eluting stent to the RCA.  An ejection fraction showed  preserved systolic function with inferior basilar and inferoapical  hypokinesis.  She was admitted to CCU.  Over the next couple days she  received education, assistance from cardiac rehab as well as case  management in regards to education and discharge needs.  By the 14th she  was ambulating without  difficulty and it was felt that the patient could  be discharged home.   DISPOSITION:  The patient is discharged home on December 13, 2007, and  asked to maintain low-sodium heart-healthy ADA diet.  Wound care as per  supplemental sheet.  Activity is restricted in regards to lifting,  driving, sexual activity or heavy exertion for 1 week.   DISCHARGE MEDICATIONS:  New prescriptions were given to her in regards  to:  1. Aspirin 325 mg daily.  2. Plavix 75 mg daily.  3. Coreg 12.5 mg b.i.d.  4. Nitroglycerin 0.4 as needed.  5. Pravachol 40 mg q.h.s.   She was asked to continue:  1. Allegra 180 mg daily.  2. Nexium 40 mg daily.  3. Resume her metformin 500 mg daily on December 14, 2007.  4. Xopenex as previously.  5. Benicar/hydrochlorothiazide 20/25 mg daily.   Blood work in 6-8 weeks in regards to FLP  and LFTs.  She was asked to  bring all medications to all appointments.  She will follow up with Dr.  Clifton James on the first available appointment on January 12, 2008, at  8:30.  She will follow up with her primary care physician as needed.  Discharge time of 45 minutes.      Joellyn Rued, PA-C      Verne Carrow, MD  Electronically Signed    EW/MEDQ  D:  12/13/2007  T:  12/13/2007  Job:  [id number]   cc:   Talmadge Coventry, M.D.

## 2010-08-13 NOTE — Cardiovascular Report (Signed)
NAMESHENEE, WIGNALL NO.:  [id number]   MEDICAL RECORD NO.:  [id number]          PATIENT TYPE:  OBV   LOCATION:  2908                         FACILITY:  [facility]   PHYSICIAN:  Vesta Mixer, M.D. DATE OF BIRTH:  12/23/1940   DATE OF PROCEDURE:  DATE OF DISCHARGE:                            CARDIAC CATHETERIZATION   Katira Dumais is a 70 year old female with history of hypertension,  diabetes mellitus, and hyperlipidemia.  She was admitted yesterday to  the hospital with episodes of chest pain.  This morning she had severe  episodes of chest pain associated with ST-segment elevation.  She was  given nitro and the pain and EKG changes resolved, but then returned  about 5 minutes later.  At this point, it was decided to bring her to  the Cath Lab for further evaluation.   The procedure was left heart catheterization with coronary angiography  and PTCA and stenting of the right coronary artery.   The right femoral artery was easily cannulated using a modified  Seldinger technique.   HEMODYNAMICS:  The LV pressure was 119/70 with an aortic pressure of  109/59.   ANGIOGRAPHY:  Left main:  The left main is fairly short.  It is  unremarkable.   The left anterior descending artery is very tortuous.  There is diffuse  irregularities throughout the LAD between 20 and 30%.  There is a 40-50%  stenosis just prior to the takeoff of the first diagonal artery.  The  first diagonal artery has minor luminal irregularities.  The remaining  mid and distal LAD have minor luminal irregularities.   The left circumflex artery is very tortuous.  There is mild diffuse  disease throughout the left circumflex artery.   The right coronary artery is moderate in size and is dominant.  It is  quite tortuous and is throughout its course.   There is a plaque in the mid RCA which has about a 50% stenosis followed  by subtotal occlusion.  Following this, the vessel is fairly normal.  The posterior descending artery and the posterolateral segment artery  are unremarkable.   The left ventriculogram (performed following the PCI) reveals overall  well-preserved left ventricular systolic function.  There is hypokinesis  of the inferior base and inferoapical segments.  The mid inferior wall  seems to contract somewhat better.   PCI, the right coronary artery was cannulated using a Judkins right 4  side-hole guide - 6-French.  The ACT initially was 220.  Angiomax was  given and the resulting ACT was 447.   A Prowater angioplasty wire was passed with considerable difficulty down  the distal right coronary artery.  It was quite difficult to get around  the bends and we used an Apex balloon catheter for support.   A 3.0 x 12 mm Apex was positioned with some difficulty across the mid  stenosis.  It was inflated up to 4 atmospheres for 25 seconds followed  by 6 atmospheres for 25 seconds.  This resulted in slight improvement of  the lumen.   At this point, a PROMUS stent 3.0 x 12 mm  was positioned with  considerable ease across the mid stenosis.  It was deployed at 12  atmospheres for 30 seconds.   Following this, a 3.0 x 15 mm PROMUS stent was brought down.  We  encountered a lot of difficulty in getting around the tortuosity of the  proximal and mid segments, but initially after deep throating the guide,  we were able to position this second stent to just proximal of the  initial stent.  It was deployed at 14 atmospheres for 42 seconds.   Poststent dilatation was achieved using a 3.5 x 15 mm Quantum Monorail.  It was positioned in the distal stented segment and inflated up to 10  atmospheres for 25 seconds and then pulled back proximally and inflated  it up to 12 atmospheres for 35 seconds.  Intracoronary nitroglycerin was  given on 2 occasions through the case.  There was considerable wire bias  and pseudostenosis caused by the wire and catheter.  These resolved   after we pulled the wire and catheter back.  Final angiogram reveals a  very nice result with resolution of the 99% stenosis down to a 0%  restenosis.  The patient does have some mild luminal irregularities, but  nonadhesive flow-limiting.   She is stable leaving the lab.  She will go to 2900.      Vesta Mixer, M.D.  Electronically Signed     PJN/MEDQ  D:  12/11/2007  T:  12/11/2007  Job:  [id number]   cc:   Veverly Fells. Excell Seltzer, MD  Talmadge Coventry, M.D.

## 2010-08-13 NOTE — H&P (Signed)
NAMECAYLI, ESCAJEDA             ACCOUNT NO.:  [id number]   MEDICAL RECORD NO.:  [id number]          PATIENT TYPE:  OBV   LOCATION:  6532                         FACILITY:  [facility]   PHYSICIAN:  Veverly Fells. Excell Seltzer, MD  DATE OF BIRTH:  Dec 31, 1940   DATE OF ADMISSION:  12/10/2007  DATE OF DISCHARGE:                              HISTORY & PHYSICAL   PRIMARY CARE PHYSICIAN:  Talmadge Coventry, MD.   PRIMARY CARDIOLOGIST:  Duke Salvia, MD, [facility]   CHIEF COMPLAINT:  Chest pain.   HISTORY OF PRESENT ILLNESS:  Ms. Bunn is a 70 year old female with no  previous history of coronary artery disease.  She has a long history of  asthma.  Four days ago, she did some yard work and since then has  complained of general malaise.  Two days ago she went to a class where  they were writing on a chalkboard and she felt like the chalk in the air  gave her some problems with her asthma.  She felt that her general  malaise was worth and now that she had no fevers with coughing up some  green sputum.  She started a course of Avelox and the cough improved.  Yesterday, she had chest pressure.  It was on the left and is also  described as a tightness and a burning sensation.  It is exertional.  It  is associated with shortness of breath and some diaphoresis as well as a  lightheaded feeling, but no nausea and vomiting.  It radiates to her  shoulder.  She feels that the symptoms are improved by rest.  She had  repeated episodes over the last 2 days and finally went to [facility].  She was evaluated there and her symptoms were concerning  for angina, so she was transferred to [facility] for further  evaluation and possible admission.  Of note, she has had no resting  symptoms, but feels like they are brought on with minimal activity.   PAST MEDICAL HISTORY:  1. Hypertension.  2. Hyperlipidemia.  3. Diabetes.  4. Obesity with a body mass index of 30.5.  5. Moderate cerebrovascular  disease with the right ICA 20-39% and left      ICA 40-59% by Dopplers in June 2009.  6. Gastroesophageal reflux disease.  7. Asthma.  8. Anxiety.  9. Diabetic neuropathy.   SURGICAL HISTORY:  She is status post hysterectomy and bronchoscopy x2.   ALLERGIES:  She is intolerant to STATINS.  She is allergic to [facility]  She has a hyperadrenergic response to Symbicort and other beta-agonist.   CURRENT MEDICATIONS:  1. Benicar 20/25 mg daily.  2. Allegra 180 mg a day.  3. Nexium 40 mg daily p.r.n.  4. Metformin 500 mg daily.  5. Aspirin 81 mg daily.  6. Multivitamin and B12 daily.  7. Coenzyme Q10 daily.  8. Avelox 400 mg daily, started recently.  9. QVAR 80 one puff b.i.d. (decreased from 2 puffs b.i.d.).  10.Xopenex 45 MDI b.i.d.   SOCIAL HISTORY:  She lives in [place] alone.  She is a retired Char[employer] fundraiser.  She has an approximately 10-pack-year history of tobacco use and quit in  2000.  She denies alcohol or drug abuse.   FAMILY HISTORY:  Mother is alive at age 75 with no history of coronary  artery disease.  Her father died at age 34 of some sudden death.  He was  at work and she was only 70 year old, so has no details, but he had no  history of coronary artery disease or epilepsy.  No autopsy was  performed.  She has one sister that is in good health.   REVIEW OF SYSTEMS:  She has had diaphoresis with chest pain, but no  fevers or chills.  She occasionally get fluid in her ears, but has not  had this recently.  The chest pain and shortness of breath as described  above.  She has chronic dyspnea on exertion and normally cannot walk a  flight of steps without stopping.  She denies orthopnea, or frequent  PND, or edema, or palpitations.  She has had no syncope or presyncope.  She has coughing and wheezing on a daily basis.  She has chronic  arthralgias.  Her reflux symptoms are well-controlled on therapy and she  has not had any hematemesis or melena.  She has had some bright red   blood per rectum in the past.  She had a colon polyp removed.  Full 14-  point review of systems is otherwise negative.   PHYSICAL EXAM:  VITAL SIGNS:  Temperature is 98.1, blood pressure  145/73, heart rate 91, respiratory rate 20, and O2 saturation 95% on  room air.  GENERAL:  She is a well-developed, well-nourished, white female in no  acute distress.  HEENT:  Normal.  NECK:  There is no lymphadenopathy, thyromegaly, bruit, or JVD noted.  CV:  Her heart is regular in rate and rhythm with an S1, S2, and no  significant murmur or gallop is noted.  Distal pulses are intact in all  four extremities.  LUNGS:  She has some bronchial breath sounds at the bases, but no  crackles, wheezing, or rales are noted.  SKIN:  No rashes or lesions are noted.  ABDOMEN:  Soft and nontender with active bowel sounds.  EXTREMITIES:  There is no cyanosis, clubbing, or edema noted.  MUSCULOSKELETAL:  There is no joint deformity or effusions and no spine  or CVA tenderness.  NEUROLOGIC:  She is alert and oriented.  Cranial nerves II through XII  are grossly intact.   Chest x-ray:  No acute disease.   EKG:  Sinus rhythm, rate 85 with no acute ischemic changes.   Hemoglobin 13.1, hematocrit 38.8, WBCs 12.5, platelets 268.  Sodium 136,  potassium 4.1, chloride 105, BUN 18, creatinine 0.9, glucose 110, point  of care markers are negative x1.   IMPRESSION:  Ms. Mcmichen was seen today by Dr. Excell Seltzer.  She was  interviewed and examined.  She is a 70 year old female with multiple  cardiovascular risk factors who presents with chest soreness and raw  feeling with exertion over several days.  However, her symptoms have  occurred in the setting of an upper respiratory infection and asthma  exacerbation..  Currently, she is resting comfortably.  Her exam is  benign except for bronchial breath sounds.  Her EKG is normal and her  initial markers are normal.  Her chest pain has typical and atypical  features.  We will  admit her and rule out myocardial infarction with  serial cardiac enzymes.  If she  rules out and has no further symptoms,  she can be considered stable for discharge tomorrow with an outpatient  Myoview.  Dobutamine may be the best option for her as her asthma seems  to be easily triggered.  If she develops EKG changes or positive  enzymes, she will need a heart catheterization.  She needs to ambulate  after she rules out to further assess her symptoms.  We will increase  her aspirin to 325 mg a day and continue her other medications.  We will  hold on beta-blocker in the setting of an asthma exacerbation and upper  respiratory infection and continue the antibiotics, which have already  been started.      Theodore Demark, PA-C      Veverly Fells. Excell Seltzer, MD  Electronically Signed    RB/MEDQ  D:  12/10/2007  T:  12/10/2007  Job:  [id number]

## 2010-08-16 NOTE — H&P (Signed)
Cheryl Hernandez, Cheryl Hernandez             ACCOUNT NO.:  [id number]   MEDICAL RECORD NO.:  [id number]          PATIENT TYPE:  EMS   LOCATION:  ED                           FACILITY:  [facility]   PHYSICIAN:  Renato Battles, M.D.     DATE OF BIRTH:  [birth year]   DATE OF ADMISSION:  12/03/2004  DATE OF DISCHARGE:                                HISTORY & PHYSICAL   REASON FOR ADMISSION:  Shortness of breath and cough.   HISTORY OF PRESENT ILLNESS:  The patient is a very pleasant 70 year old  white female who started to experience sinusitis and congestion in her lungs  2 weeks ago which lead to a prescription of antibiotics, initially a Z-Pak  then Cipro.  The patient started to make slow but gradual improvement.  However, yesterday the patient had a relapse where she started coughing  persistently and her breathing worsened significantly.  She continued to  cough and bring up yellowish-green sputum.  She denies any fever but is  reporting some chills.  There is no chest pain except while she is coughing.  She presented to the emergency department today for further evaluation and  management.  There is no history of moving or acquiring new pets.   REVIEW OF SYSTEMS:  CONSTITUTIONAL:  Symptoms are positive for chills but no  fever, no night sweats, no weight changes.  GASTROINTESTINAL:  No nausea or  vomiting, no diarrhea or constipation.  GENITOURINARY:  No dysuria,  hematuria or retention.  CARDIOPULMONARY:  Positive for productive cough,  positive for shortness of breath and congestion.   PAST MEDICAL HISTORY:  1.  Asthma in the 1970s, worse around fall and spring.  2.  Hypertension.  3.  Gastroesophageal reflux disease.   PAST SURGICAL HISTORY:  1.  Hysterectomy.  2.  Bronchoscopy at [facility] a long time ago.  Patient reported,      however, that she was told she has some lung scarring.   FAMILY HISTORY:  Noncontributory.   SOCIAL HISTORY:  No tobacco, alcohol or drugs.   ALLERGIES:  Keflex.   MEDICATIONS:  1.  Theo-Dur 300 mg p.o. daily.  Patient says uses this p.r.n. when she      needs it.  2.  Albuterol MDI p.r.n.  3.  Cipro which was prescribed.  4.  Benicar unknown dose.  5.  Lasix 20 mg p.o. p.r.n. for peripheral edema.   PHYSICAL EXAMINATION:  GENERAL:  Alert and oriented x 3 in moderate  distress.  VITAL SIGNS:  Temperature 97.9, heart rate 111, respiratory rate 16-20,  blood pressure 147/71, O2 sat 95-98pulse on room air.  HEENT:  Head is atraumatic and normocephalic.  Pupils are equal, round and  reactive to light.  Normal vision.  NECK:  No lymphadenopathy, no thyromegaly, no JVD.  CHEST:  Patient has bilateral wheezing which rhonchi diffusely.  HEART:  Regular rhythm, tachycardic with no murmurs.  ABDOMEN:  Soft, nontender and nondistended, normal, active bowel sounds.  EXTREMITIES:  No cyanosis, edema or clubbing.   DIAGNOSTIC STUDIES:  BMP showed elevated BUN at 24.  Random glucose 129.  Chest x-ray showed possible lung scarring.  Chest CT showed no PE and again  showed scarring of lungs.   IMPRESSION:  1.  Bronchitis/sinusitis.  2.  Asthma exacerbation.  3.  Dehydration.  4.  Hypertension.  5.  Gastroesophageal reflux disease.   PLAN:  1.  Admit to the hospital for treatment and monitoring.  2.  IV steroids with Solu-Medrol 60 mg q.6h.  3.  Avelox for bronchitis.  4.  Recheck renal function after hydration.  5.  Protonix for GERD.  6.  Continue Benicar for hypertension.      Renato Battles, M.D.  Electronically Signed     SA/MEDQ  D:  12/03/2004  T:  12/03/2004  Job:  [id number]   cc:   Talmadge Coventry, M.D.  [address]  [place]  [place] [address]  Fax: [phone]

## 2010-08-16 NOTE — Discharge Summary (Signed)
Cheryl Hernandez, Cheryl Hernandez             ACCOUNT NO.:  [id number]   MEDICAL RECORD NO.:  [id number]          PATIENT TYPE:  INP   LOCATION:  1411                         [facility]ACILITY[facility]  [facility]   PHYSICIAN:  Hettie Holstein, D.O.    DATE OF BIRTH:  Dec 29, 1940   DATE OF ADMISSION:  12/03/2004  DATE OF DISCHARGE:  12/04/2004                                 DISCHARGE SUMMARY   PRIMARY CARE PHYSICIAN:  Talmadge Coventry, M.D.   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   DISCHARGE DIAGNOSES:  1.  Acute exacerbation of chronic bronchitis with marked response to IV Solu-      Medrol and bronchodilator therapy.  2.  History of tobacco abuse, stopped six years ago.  3.  History of asthma diagnosed in 1970's.  4.  Hypertension.  5.  Gastroesophageal reflux disease.   DISCHARGE MEDICATIONS:  1.  Penicillin taper starting at 50 mg taper, 10 mg every three days.  2.  Humibid LA one p.o. twice a day.  3.  Avelox 400 mg daily to complete on December 14, 2004.  4.  Theo-Dur and albuterol MDI as before.  5.  Hydrochlorothiazide 25 mg daily while on prednisone.  6.  Benicar as before.  The dosage provided by the patient was 12.5 mg      daily.  7.  Advair 100/50 one dose inhaled two times daily with instructions to use      this during the months when she typically has problems.  She may      consider increasing the dose of this medication once she has completed      her steroid taper.  8.  Albuterol nebulized 2.5 mg every four to six hours and every two as      needed on an as-needed basis.  9.  Spiriva Handihaler one cap inhaled daily.   DISPOSITION:  Cheryl Hernandez is being discharged in stable and improved  condition to follow up with Dr. Talmadge Coventry within one week.  She has  had lots of questions and concerns and has requested to follow up at some  point with a pulmonologist, perhaps after the exacerbation has subsided and  perhaps  she can undergo pulmonary function testing through a pulmonologist.  She has seen Dr. Kriste Basque in the past.  She does, as mentioned, request revisit  once this acute exacerbation subsides.   HISTORY OF PRESENT ILLNESS:  For full details, please refer to H&P as  dictated by Dr. Renato Battles.  However, briefly Cheryl Hernandez is a pleasant 70-  year-old female who has been having problems with nasal congestion for the  past couple of weeks.  She does have a medicine cabinet full of medications  she uses from time to time including Cipro and prednisone at her discretion.  She is a retired Astronomer.  She does have several months of the year for the past  few years with exacerbations where she has purulent productive cough and she  says particularly the summer months are problematic.  She is admitted for  further management.   HOSPITAL  COURSE:  She was initiated in IV Solu-Medrol as well as  antibiotics.  She underwent imaging including CT scan of her chest without  incident without PE.  Some coronary artery calcifications were incidentally  noted as there was a questionable pneumonia in her left lower lobe,  atelectasis versus scarring.  In any event, it was felt that she should  quickly taper off of her steroids here over the next few days and continue  antibiotic therapy.  Recommend followup with Dr. Smith Mince this week for  symptom reassessment and response to therapy.  She is doing quite well here  at present.  Air entry is good on examination.  She appears to be clinically  improved and is stable for discharge.      Hettie Holstein, D.O.  Electronically Signed     ESS/MEDQ  D:  12/04/2004  T:  12/04/2004  Job:  [id number]   cc:   Talmadge Coventry, M.D.  [address]  [place]  [place] [address]  Fax: [phone]

## 2010-08-16 NOTE — Op Note (Signed)
NAME:  Cheryl Hernandez, RIKER             ACCOUNT NO.:  [id number]   MEDICAL RECORD NO.:  [id number]          PATIENT TYPE:  AMB   LOCATION:  CARD                         FACILITY:  [facility]   PHYSICIAN:  Casimiro Needle B. Sherene Sires, M.D. [facility] OF BIRTH:  1941/01/30   DATE OF PROCEDURE:  03/18/2005  DATE OF DISCHARGE:                                 OPERATIVE REPORT   PROCEDURE:  Fiberoptic bronchoscopy with lavage.   PRIMARY CARE PHYSICIAN:  Talmadge Coventry, M.D.   HISTORY AND INDICATIONS:  Please see dictated H&P. This patient is a former  smoker with localized rhonchi on the right and a persistent cough. Quick  look bronchoscopy was indicated for evaluation of the airways to make sure  she did not have an endobronchial lesion. She agreed to the procedure after  a full discussion of the risks, benefits, and alternatives. Please see  dictated office records for further evaluation information.   Her exam prior to the procedure is unchanged and history is unchanged from  previous office records.   The procedure was performed in the bronchoscopy suite with continuous  monitoring by surface ECG and oximetry. She received 1% lidocaine by updraft  nebulizer and also 2% lidocaine to the right naris. She received a total of  2.5 mg of IV Versed and 50 mg of Demerol through the procedure for adequate  sedation and cough suppression.   Using a standard flexible fiberoptic bronchoscope, the right naris was  easily cannulated with good visualization of the entire oropharynx and  larynx. There were no abnormalities, the cord moved normally.   Using an additional 1% lidocaine as needed, the entire tracheobronchial tree  was explored bilaterally with the following findings:   1.  There was evidence of mild diffuse chronic bronchitis.  2.  There was marked dynamic collapse of the airways especially on the right      and especially the right middle lobe, which was a slit like opening.      However, on  inspiration, all the airways opened widely to the      subsegmental level.   DESCRIPTION OF PROCEDURE:  Using the right middle lobe orifice, I lavaged  the right middle lobe for cytology and also for cell count and differential  to rule out eosinophilic bronchitis to be complete.   IMPRESSION:  Chronic bronchitis/chronic cough that may not be directly  related to one another although she does have significant dynamic collapse  of the right sided airways. This explains the rhonchi on exam localized to  the right.   RECOMMENDATIONS:  Followup in the office.           ______________________________  Charlaine Dalton. Sherene Sires, M.D. [facility]     MBW/MEDQ  D:  03/18/2005  T:  03/19/2005  Job:  [id number]   cc:   Talmadge Coventry, M.D.  Fax: [phone]

## 2010-09-04 ENCOUNTER — Other Ambulatory Visit (INDEPENDENT_AMBULATORY_CARE_PROVIDER_SITE_OTHER): Payer: Medicare Other

## 2010-09-04 ENCOUNTER — Encounter (INDEPENDENT_AMBULATORY_CARE_PROVIDER_SITE_OTHER): Payer: Medicare Other

## 2010-09-04 ENCOUNTER — Encounter (INDEPENDENT_AMBULATORY_CARE_PROVIDER_SITE_OTHER): Payer: Medicare Other | Admitting: Vascular Surgery

## 2010-09-04 DIAGNOSIS — I83893 Varicose veins of bilateral lower extremities with other complications: Secondary | ICD-10-CM

## 2010-09-04 DIAGNOSIS — I6529 Occlusion and stenosis of unspecified carotid artery: Secondary | ICD-10-CM

## 2010-09-05 ENCOUNTER — Ambulatory Visit (INDEPENDENT_AMBULATORY_CARE_PROVIDER_SITE_OTHER): Payer: Medicare Other

## 2010-09-05 DIAGNOSIS — I83893 Varicose veins of bilateral lower extremities with other complications: Secondary | ICD-10-CM

## 2010-09-05 NOTE — Assessment & Plan Note (Signed)
OFFICE VISIT  Cheryl Hernandez, Cheryl Hernandez DOB:  Jun 06, 1940                                       09/04/2010 BJSEG#:[id number]  The patient is seen today for evaluation of diffuse peripheral vascular occlusive disease.  She has been followed in our office for Hernandez number of years with noninvasive studies to evaluate asymptomatic left carotid stenosis.  I have reviewed her studies back from 2007 forward.  She has had minimal change.  She does not have any aphasia or right-sided symptoms.  She does have chronic difficulty with blurred vision in her left eye which is transient.  She reports she has seen her eye doctor, who does not see any primary eye pathology.  I explained to her that there is no way to guarantee that this is not related to her mild to moderate stenosis in the left carotid system, but this would be quite unlikely, given the history of this.  I did review symptom of typical amaurosis and left brain events with her.  She will notify me should this occur.  She also is concerned regarding areas over the right pretibial area with reticular veins over this area and discomfort related to this.  She has no history of DVT.  She does elevate her legs when possible and takes Aleve for the discomfort.  Her past history is otherwise unchanged.  PHYSICAL EXAMINATION:  General:  Well-developed, well-nourished white female appearing her stated age in no acute stress.  Vital Signs:  Blood pressure is 149/70, heart rate 101, respirations 20.  HEENT:  Normal. Vascular:  Her carotid pulsations are normal.  Her radial pulses are 2+. She has 2+ dorsalis pedis pulses bilaterally.  She does have some reticular veins over the right pretibial area of discomfort and telangiectasia below this area.  She does not have any swelling.  She underwent duplex in our office, and this does show some reflux in the saphenofemoral junction and at the saphenous vein at the level of the knee;  this is very mild.  Her saphenous vein is not particularly enlarged.  I imaged this myself, and some of these reticular veins over the pretibial area do arise from the saphenous vein in an area where it is not enlarged.  I discussed this with the patient and explained that there is no danger associated with this, that it can be progressive over time.  She was concerned regarding the discomfort over this area and also the appearance of the telangiectasia below this.  I would not recommend ablation of her saphenous vein due to the mild reflux present and would recommend primary treatment of the reticular veins and telangiectasia with sclerotherapy.  She understands that she could have recurrence of this and would be Hernandez candidate for ablation in the future if this progresses.  She understands and wishes to proceed with sclerotherapy of the area of concern in her right leg.  We will arrange this at her convenience on an as needed basis.    Larina Earthly, M.D. Electronically Signed  TFE/MEDQ  D:  09/04/2010  T:  09/05/2010  Job:  [id number]  cc:   Larina Earthly, M.D.

## 2010-09-09 ENCOUNTER — Encounter: Payer: Self-pay | Admitting: Critical Care Medicine

## 2010-09-13 ENCOUNTER — Encounter: Payer: Self-pay | Admitting: Critical Care Medicine

## 2010-09-13 ENCOUNTER — Ambulatory Visit (INDEPENDENT_AMBULATORY_CARE_PROVIDER_SITE_OTHER): Payer: Medicare Other | Admitting: Critical Care Medicine

## 2010-09-13 VITALS — BP 130/70 | HR 88 | Temp 98.1°F | Ht 64.0 in | Wt 172.6 lb

## 2010-09-13 DIAGNOSIS — J45909 Unspecified asthma, uncomplicated: Secondary | ICD-10-CM

## 2010-09-13 MED ORDER — BUDESONIDE-FORMOTEROL FUMARATE 80-4.5 MCG/ACT IN AERO: 1.0000 | INHALATION_SPRAY | Freq: Two times a day (BID) | RESPIRATORY_TRACT | Status: DC

## 2010-09-13 NOTE — Patient Instructions (Signed)
Start symbicort 80 one puff twice daily Return 4 months

## 2010-09-13 NOTE — Progress Notes (Signed)
Subjective:    Patient ID: Cheryl Hernandez, female    DOB: 04-23-1940, 70 y.o.   MRN: [id number]  HPI 70 y.o.   Female with moderate persistent asthma   09/13/2010 Has been doing well, less cough and wheezing.  Has been using symbicort prn  Pt denies any significant sore throat, nasal congestion or excess secretions, fever, chills, sweats, unintended weight loss, pleurtic or exertional chest pain, orthopnea PND, or leg swelling Pt denies any increase in rescue therapy over baseline, denies waking up needing it or having any early am or nocturnal exacerbations of coughing/wheezing/or dyspnea. Pt also denies any obvious fluctuation in symptoms with  weather or environmental change or other alleviating or aggravating factors >2 days/week  0-2 days/week       Nighttime Awakenings  0-2/month  0-2/month       Asthma interference with normal activity  No limitations  No limitations       SABA use (not for EIB)  0-2 days/wk  0-2 days/wk       Risk: Exacerbations requiring oral systemic steroids  0-1 / year  0-1 / year          [place]  Go to [employer]  Past Medical History  Diagnosis Date  . Asthma   . HTN (hypertension)   . Paroxysmal tachycardia   . GERD (gastroesophageal reflux disease)   . Diabetes insipidus   . Rhinitis   . Hyperlipidemia   . Arthritis   . COPD (chronic obstructive pulmonary disease)   . CAD (coronary artery disease)   . Diverticulosis   . Chronic diarrhea      Family History  Problem Relation Age of Onset  . Allergies Mother   . Prostate cancer Maternal Grandfather      History   Social History  . Marital Status: Widowed    Spouse Name: N/A    Number of Children: N/A  . Years of Education: N/A   Occupational History  . retired Charity fundraiser    Social History Main Topics  . Smoking status: Former Smoker -- 1.0 packs/day for 25 years    Types: Cigarettes    Quit date: 03/31/1998  . Smokeless tobacco: Never Used  . Alcohol Use: Not on file  .  Drug Use: Not on file  . Sexually Active: Not on file   Other Topics Concern  . Not on file   Social History Narrative  . No narrative on file     Allergies  Allergen Reactions  . Cefuroxime Axetil   . Cephalexin   . Doxycycline     REACTION: vomiting/ GI upset     Outpatient Prescriptions Prior to Visit  Medication Sig Dispense Refill  . albuterol (PROAIR HFA) 108 (90 BASE) MCG/ACT inhaler Inhale 2 puffs into the lungs every 6 (six) hours as needed.        Marland Kitchen aspirin 81 MG tablet Take 81 mg by mouth daily.        . B Complex-Biotin-FA (B COMPLETE) TABS Take 1 tablet by mouth daily.        . calcium carbonate (TUMS - DOSED IN MG ELEMENTAL CALCIUM) 500 MG chewable tablet Chew 1 tablet by mouth as needed.       . cholecalciferol (VITAMIN D) 1000 UNITS tablet Take 2,000 Units by mouth daily.       . Coenzyme Q10 (CO Q10) 100 MG TABS Take 1 tablet by mouth daily.        Marland Kitchen dextromethorphan-guaiFENesin [facility]  DM) 30-600 MG per 12 hr tablet Take 1 tablet by mouth every 12 (twelve) hours.        . Doxylamine Succinate, Sleep, (UNISOM) 25 MG tablet Take 12.5 mg by mouth at bedtime as needed.       . fexofenadine (ALLEGRA) 180 MG tablet Take 180 mg by mouth daily.        . Glucosamine 500 MG CAPS Take 1 capsule by mouth daily.        . Melatonin-Pyridoxine 5-10 MG TABS Take 1 tablet by mouth as needed.        . metFORMIN (GLUMETZA) 500 MG (MOD) 24 hr tablet Take 500 mg by mouth daily with breakfast.        . Multiple Vitamins-Minerals (MULTIVITAMIN WITH MINERALS) tablet Take 1 tablet by mouth daily.        . nebivolol (BYSTOLIC) 5 MG tablet Take 5 mg by mouth daily.        Marland Kitchen omeprazole (PRILOSEC) 20 MG capsule Take 20 mg by mouth daily as needed.       . pravastatin (PRAVACHOL) 40 MG tablet Take 40 mg by mouth daily.        . budesonide-formoterol (SYMBICORT) 160-4.5 MCG/ACT inhaler Inhale 2 puffs into the lungs 2 (two) times daily as needed.       Marland Kitchen omega-3 acid ethyl esters (LOVAZA) 1 G  capsule Take 1 g by mouth daily.        . furosemide (LASIX) 40 MG tablet Take 40 mg by mouth daily.            Review of Systems Constitutional:   No  weight loss, night sweats,  Fevers, chills, fatigue, lassitude. HEENT:   No headaches,  Difficulty swallowing,  Tooth/dental problems,  Sore throat,                No sneezing, itching, ear ache, nasal congestion, post nasal drip,   CV:  No chest pain,  Orthopnea, PND, swelling in lower extremities, anasarca, dizziness, palpitations  GI  No heartburn, indigestion, abdominal pain, nausea, vomiting, diarrhea, change in bowel habits, loss of appetite  Resp: No shortness of breath with exertion or at rest.  No excess mucus, no productive cough,  No non-productive cough,  No coughing up of blood.  No change in color of mucus.  No wheezing.  No chest wall deformity  Skin: no rash or lesions.  GU: no dysuria, change in color of urine, no urgency or frequency.  No flank pain.  MS:  No joint pain or swelling.  No decreased range of motion.  No back pain.  Psych:  No change in mood or affect. No depression or anxiety.  No memory loss.     Objective:   Physical Exam Filed Vitals:   09/13/10 1549  BP: 130/70  Pulse: 88  Temp: 98.1 F (36.7 C)  TempSrc: Oral  Height: 5\' 4"  (1.626 m)  Weight: 172 lb 9.6 oz (78.291 kg)  SpO2: 95%    Gen: Pleasant, well-nourished, in no distress,  normal affect  ENT: No lesions,  mouth clear,  oropharynx clear, no postnasal drip  Neck: No JVD, no TMG, no carotid bruits  Lungs: No use of accessory muscles, no dullness to percussion, clear without rales or rhonchi  Cardiovascular: RRR, heart sounds normal, no murmur or gallops, no peripheral edema  Abdomen: soft and NT, no HSM,  BS normal  Musculoskeletal: No deformities, no cyanosis or clubbing  Neuro: alert, non focal  Skin: Warm, no lesions or rashes        Assessment & Plan:   ASTHMA, PERSISTENT, MODERATE Non compliant with  ICS Plan Note normal spiro on 6/15 pft Pt agreeable to try 80 symbicort one puff bid She is concerned with steroid effect     Updated Medication List Outpatient Encounter Prescriptions as of 09/13/2010  Medication Sig Dispense Refill  . albuterol (PROAIR HFA) 108 (90 BASE) MCG/ACT inhaler Inhale 2 puffs into the lungs every 6 (six) hours as needed.        Marland Kitchen aspirin 81 MG tablet Take 81 mg by mouth daily.        . B Complex-Biotin-FA (B COMPLETE) TABS Take 1 tablet by mouth daily.        . calcium carbonate (TUMS - DOSED IN MG ELEMENTAL CALCIUM) 500 MG chewable tablet Chew 1 tablet by mouth as needed.       . cholecalciferol (VITAMIN D) 1000 UNITS tablet Take 2,000 Units by mouth daily.       . Coenzyme Q10 (CO Q10) 100 MG TABS Take 1 tablet by mouth daily.        Marland Kitchen dextromethorphan-guaiFENesin (MUCINEX DM) 30-600 MG per 12 hr tablet Take 1 tablet by mouth every 12 (twelve) hours.        . Doxylamine Succinate, Sleep, (UNISOM) 25 MG tablet Take 12.5 mg by mouth at bedtime as needed.       . fexofenadine (ALLEGRA) 180 MG tablet Take 180 mg by mouth daily.        . Glucosamine 500 MG CAPS Take 1 capsule by mouth daily.        . Glucose Blood (FREESTYLE LITE TEST VI) Once daily      . hydrochlorothiazide (HYDRODIURIL) 12.5 MG tablet Every other day      . Melatonin-Pyridoxine 5-10 MG TABS Take 1 tablet by mouth as needed.        . metFORMIN (GLUMETZA) 500 MG (MOD) 24 hr tablet Take 500 mg by mouth daily with breakfast.        . Multiple Vitamins-Minerals (MULTIVITAMIN WITH MINERALS) tablet Take 1 tablet by mouth daily.        . nebivolol (BYSTOLIC) 5 MG tablet Take 5 mg by mouth daily.        Marland Kitchen omeprazole (PRILOSEC) 20 MG capsule Take 20 mg by mouth daily as needed.       . pravastatin (PRAVACHOL) 40 MG tablet Take 40 mg by mouth daily.        Marland Kitchen zolpidem (AMBIEN) 10 MG tablet As needed      . DISCONTD: budesonide-formoterol (SYMBICORT) 160-4.5 MCG/ACT inhaler Inhale 2 puffs into the lungs 2  (two) times daily as needed.       . budesonide-formoterol (SYMBICORT) 80-4.5 MCG/ACT inhaler Inhale 1 puff into the lungs 2 (two) times daily.  1 Inhaler  6  . omega-3 acid ethyl esters (LOVAZA) 1 G capsule Take 1 g by mouth daily.        Marland Kitchen DISCONTD: furosemide (LASIX) 40 MG tablet Take 40 mg by mouth daily.

## 2010-09-14 NOTE — Assessment & Plan Note (Signed)
Non compliant with ICS Plan Note normal spiro on 6/15 pft Pt agreeable to try 80 symbicort one puff bid She is concerned with steroid effect

## 2010-09-16 NOTE — Procedures (Unsigned)
LOWER EXTREMITY VENOUS REFLUX EXAM  INDICATION:  Varicose veins, pain and swelling.  EXAM:  Using color-flow imaging and pulse Doppler spectral analysis, the right common femoral, femoral, popliteal, posterior tibial, great and small saphenous veins were evaluated.  There is no evidence suggesting deep venous insufficiency in the right lower extremity.  The right saphenofemoral junction is not competent with reflux of >500 milliseconds. The right GSV is not competent with reflux of >500 milliseconds with the caliber as described below.  The right proximal small saphenous vein demonstrates competency.  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  GSV Diameter (used if found to be incompetent only)                                           Right    Left Proximal Greater Saphenous Vein           0.68 cm  cm Proximal-to-mid-thigh                     0.53 cm  cm Mid thigh                                 0.36 cm  cm Mid-distal thigh                          cm       cm Distal thigh                              0.34 cm  cm Knee                                      0.32 cm  cm  IMPRESSION: 1. The right great saphenous vein is not competent with reflux of >500     milliseconds. 2. The right great saphenous vein is not tortuous. 3. The deep venous system of the right lower extremity is competent. 4. The right small saphenous vein is competent, with chronic thrombus     present in the proximal to mid calf segment.         ___________________________________________ Larina Earthly, M.D.  SH/MEDQ  D:  09/04/2010  T:  09/04/2010  Job:  [id number]

## 2010-09-16 NOTE — Procedures (Unsigned)
CAROTID DUPLEX EXAM  INDICATION:  Followup carotid stenosis.  HISTORY: Diabetes:  Yes Cardiac:  CAD, stent Hypertension:  Yes Smoking:  Previous Previous Surgery:  No CV History:  Asymptomatic Amaurosis Fugax No, Paresthesias No, Hemiparesis No                                      RIGHT             LEFT Brachial systolic pressure:         134               134 Brachial Doppler waveforms:         WNL               WNL Vertebral direction of flow:        Antegrade         Antegrade DUPLEX VELOCITIES (cm/sec) CCA peak systolic                   99                87 ECA peak systolic                   144               105 ICA peak systolic                   75                169 ICA end diastolic                   28                52 PLAQUE MORPHOLOGY:                  Heterogeneous     Heterogenous PLAQUE AMOUNT:                      Mild              Mild to moderate PLAQUE LOCATION:                    CCA/ICA/ECA       CCA/ICA/ECA  IMPRESSION: 1. Right internal carotid artery stenosis in the 1% to 39% range. 2. Left internal carotid artery stenosis in the 40% to 59% range. 3. Essentially unchanged since study on 09/06/2009.       ___________________________________________ Larina Earthly, M.D.  SH/MEDQ  D:  09/04/2010  T:  09/04/2010  Job:  [id number]

## 2010-11-22 ENCOUNTER — Ambulatory Visit (HOSPITAL_COMMUNITY)
Admission: RE | Admit: 2010-11-22 | Discharge: 2010-11-22 | Disposition: A | Discharge status: Home / Self Care | Payer: Medicare Other | Source: Ambulatory Visit | Attending: Orthopedic Surgery | Admitting: Orthopedic Surgery

## 2010-11-22 ENCOUNTER — Other Ambulatory Visit (HOSPITAL_COMMUNITY): Payer: Self-pay | Admitting: Orthopedic Surgery

## 2010-11-22 ENCOUNTER — Other Ambulatory Visit: Payer: Self-pay | Admitting: Orthopedic Surgery

## 2010-11-22 ENCOUNTER — Encounter (HOSPITAL_COMMUNITY): Payer: Medicare Other

## 2010-11-22 DIAGNOSIS — Z01818 Encounter for other preprocedural examination: Secondary | ICD-10-CM | POA: Insufficient documentation

## 2010-11-22 DIAGNOSIS — M1990 Unspecified osteoarthritis, unspecified site: Secondary | ICD-10-CM

## 2010-11-22 DIAGNOSIS — Z01812 Encounter for preprocedural laboratory examination: Secondary | ICD-10-CM | POA: Insufficient documentation

## 2010-11-22 DIAGNOSIS — M169 Osteoarthritis of hip, unspecified: Secondary | ICD-10-CM | POA: Insufficient documentation

## 2010-11-22 DIAGNOSIS — M1610 Unilateral primary osteoarthritis, unspecified hip: Secondary | ICD-10-CM | POA: Insufficient documentation

## 2010-11-22 LAB — COMPREHENSIVE METABOLIC PANEL
ALT: 14 U/L (ref 0–35)
Alkaline Phosphatase: 99 U/L (ref 39–117)
BUN: 16 mg/dL (ref 6–23)
CO2: 28 mEq/L (ref 19–32)
Calcium: 10.7 mg/dL — ABNORMAL HIGH (ref 8.4–10.5)
GFR calc Af Amer: >60 mL/min (ref >60)
GFR calc non Af Amer: >60 mL/min (ref >60)
Glucose, Bld: 103 mg/dL — ABNORMAL HIGH (ref 70–99)
Sodium: 137 mEq/L (ref 135–145)

## 2010-11-22 LAB — CBC
MCH: 30 pg (ref 26.0–34.0)
MCV: 90.5 fL (ref 78.0–100.0)
Platelets: 273 10*3/uL (ref 150–400)
RBC: 4.1 MIL/uL (ref 3.87–5.11)
RDW: 14.4 % (ref 11.5–15.5)
WBC: 11.4 10*3/uL — ABNORMAL HIGH (ref 4.0–10.5)

## 2010-11-22 LAB — URINALYSIS, ROUTINE W REFLEX MICROSCOPIC
Bilirubin Urine: NEGATIVE
Ketones, ur: NEGATIVE mg/dL
Nitrite: NEGATIVE
Specific Gravity, Urine: 1.017 (ref 1.005–1.030)
Urobilinogen, UA: 0.2 mg/dL (ref 0.0–1.0)

## 2010-11-22 LAB — PROTIME-INR: Prothrombin Time: 13.7 seconds (ref 11.6–15.2)

## 2010-11-22 LAB — APTT: aPTT: 33 seconds (ref 24–37)

## 2010-12-04 ENCOUNTER — Inpatient Hospital Stay (HOSPITAL_COMMUNITY): Payer: Medicare Other

## 2010-12-04 ENCOUNTER — Inpatient Hospital Stay (HOSPITAL_COMMUNITY)
Admission: RE | Admit: 2010-12-04 | Discharge: 2010-12-07 | DRG: 470 | Disposition: A | Discharge status: Home Health | Payer: Medicare Other | Source: Ambulatory Visit | Attending: Orthopedic Surgery | Admitting: Orthopedic Surgery

## 2010-12-04 DIAGNOSIS — E119 Type 2 diabetes mellitus without complications: Secondary | ICD-10-CM | POA: Diagnosis present

## 2010-12-04 DIAGNOSIS — M169 Osteoarthritis of hip, unspecified: Principal | ICD-10-CM | POA: Diagnosis present

## 2010-12-04 DIAGNOSIS — I252 Old myocardial infarction: Secondary | ICD-10-CM

## 2010-12-04 DIAGNOSIS — E7800 Pure hypercholesterolemia, unspecified: Secondary | ICD-10-CM | POA: Diagnosis present

## 2010-12-04 DIAGNOSIS — J4489 Other specified chronic obstructive pulmonary disease: Secondary | ICD-10-CM | POA: Diagnosis present

## 2010-12-04 DIAGNOSIS — I2510 Atherosclerotic heart disease of native coronary artery without angina pectoris: Secondary | ICD-10-CM | POA: Diagnosis present

## 2010-12-04 DIAGNOSIS — M949 Disorder of cartilage, unspecified: Secondary | ICD-10-CM | POA: Diagnosis present

## 2010-12-04 DIAGNOSIS — M1610 Unilateral primary osteoarthritis, unspecified hip: Principal | ICD-10-CM | POA: Diagnosis present

## 2010-12-04 DIAGNOSIS — J449 Chronic obstructive pulmonary disease, unspecified: Secondary | ICD-10-CM | POA: Diagnosis present

## 2010-12-04 DIAGNOSIS — Z01812 Encounter for preprocedural laboratory examination: Secondary | ICD-10-CM

## 2010-12-04 DIAGNOSIS — M899 Disorder of bone, unspecified: Secondary | ICD-10-CM | POA: Diagnosis present

## 2010-12-04 LAB — GLUCOSE, CAPILLARY

## 2010-12-04 LAB — TYPE AND SCREEN

## 2010-12-04 NOTE — H&P (Signed)
NAMERAIA, AMICO NO.:  [id number]  MEDICAL RECORD NO.:  [id number]  LOCATION:                                 FACILITY:  PHYSICIAN:  Ollen Gross, M.D.    DATE OF BIRTH:  1941/02/20  DATE OF ADMISSION:  12/04/2010 DATE OF DISCHARGE:                             HISTORY & PHYSICAL   DATE OF ADMISSION:  December 04, 2010  CHIEF COMPLAINT:  Right hip pain.  HISTORY OF PRESENT ILLNESS:  The patient is a 70 year old female who has seen by Dr. Lequita Halt for ongoing right hip pain.  She has known end-stage arthritis.  X-rays in the office show advanced arthritis which has been increasing with time.  Her symptoms are getting worse and it has started to interfere with her activities and becoming functionally limiting.  It is felt she would benefit from undergoing surgical intervention.  Risks and benefits have been discussed and she elects to proceed with surgery.  ALLERGIES:  KEFLEX causes welts and multiple joint pain.  Intolerances: VIBRAMYCIN causes diarrhea.  CURRENT MEDICATIONS: 1. Bystolic 5 mg daily. 2. Hydrochlorothiazide 12.5 mg p.r.n. every other day. 3. Fexofenadine 180 mg. 4. Guaifenesin 400 mg. 5. Symbicort 80/4.5. 6. Glucosamine/chondroitin. 7. Multivitamin 8. Vitamin B complex. 9. CoQ10. 10.Vitamin D3. 11.Baby aspirin. 12.Unisom. 13.Ambien. 14.Aleve.  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  Childhood illnesses of measles, mumps, and scarlet fever.  PAST SURGICAL HISTORY:  Cardiac catheterization with stents x2 in September 2009 post MI.  Also hysterectomy in 1979.  FAMILY HISTORY:  Mother with arthritis,  has one sibling with arthritis.  SOCIAL HISTORY:  Widowed.  She is an Charity fundraiser, past smoker, very seldom intake of alcohol.  Lives alone.  She does want to look into a skilled rehab facility.  REVIEW OF SYSTEMS:  GENERAL:  No fevers, chills, night sweats.  NEURO: A little bit of insomnia.  No seizures, syncope or paralysis. RESPIRATORY:  She has a little bit of shortness of breath on exertion. No shortness breath at rest.  Occasional cough.  CARDIOVASCULAR:  No chest pain or orthopnea.  GI:  She has some constipation.  No nausea, vomiting or diarrhea.  GU:  A little bit of nocturia.  No dysuria, hematuria.  MUSCULOSKELETAL:  Hip pain.  PHYSICAL EXAMINATION:  VITAL SIGNS:  Pulse 89, respirations 14, blood pressure 158/78. GENERAL:  A 70 year old white female, well-nourished, well-developed, in no acute distress.  She is alert,oriented  and cooperative.  Good historian. HEENT:  Normocephalic, atraumatic.  Pupils are reactive.  EOMs intact. NECK:  Supple. CHEST:  Clear. HEART: Regular rate and rhythm with a grade 2 to 3/6 systolic ejection murmur best heard over aortic point. ABDOMEN:  Soft, slightly round.  Bowel sounds present. RECTAL, BREASTS, GENITALIA:  Not done.  Not pertinent to present illness. EXTREMITIES:  Right knee shows range of motion 0 to 135, faint patellofemoral crepitus, minimal tenderness laterally.  She has a little bit of lateral collateral ligament tenderness also.  Right hip:  She has painful range of motion, flexion of 100, internal rotation 0, external rotation 15, and abduction 20.  IMPRESSION:  Osteoarthritis, right hip.  PLAN:  The patient will be admitted to [facility] to undergo right total replacement arthroplasty.  Surgery will be performed by Dr. Ollen Gross.  Post hospital stay, she will either go home with family versus wanting to look into a skilled facility for inpatient rehab.  Dictated For:  Ollen Gross, MD     Alexzandrew L.  Julien Girt, P.A.C.   ______________________________ Ollen Gross, M.D.    ALP/MEDQ  D:  12/02/2010  T:  12/02/2010  Job:  [id number]  cc:   Charlcie Cradle. Delford Field, MD, FCCP [address] [place] [address]  Lyn Records, M.D. Fax: [phone]  Oretha Milch, MD [address] [place] [address]  Electronically Signed by Patrica Duel P.A.C. on 12/04/2010 10:00:44 AM Electronically Signed by Ollen Gross M.D. on 12/04/2010 10:05:09 AM

## 2010-12-05 LAB — CBC
Hemoglobin: 10 g/dL — ABNORMAL LOW (ref 12.0–15.0)
Platelets: 265 10*3/uL (ref 150–400)
RBC: 3.41 MIL/uL — ABNORMAL LOW (ref 3.87–5.11)
WBC: 10.9 10*3/uL — ABNORMAL HIGH (ref 4.0–10.5)

## 2010-12-05 LAB — BASIC METABOLIC PANEL
CO2: 27 mEq/L (ref 19–32)
Chloride: 104 mEq/L (ref 96–112)
Glucose, Bld: 91 mg/dL (ref 70–99)
Sodium: 139 mEq/L (ref 135–145)

## 2010-12-05 LAB — GLUCOSE, CAPILLARY: Glucose-Capillary: 92 mg/dL (ref 70–99)

## 2010-12-06 LAB — BASIC METABOLIC PANEL
BUN: 9 mg/dL (ref 6–23)
Creatinine, Ser: 0.64 mg/dL (ref 0.50–1.10)
GFR calc Af Amer: >60 mL/min (ref >60)
GFR calc non Af Amer: >60 mL/min (ref >60)
Glucose, Bld: 115 mg/dL — ABNORMAL HIGH (ref 70–99)

## 2010-12-06 LAB — CBC
HCT: 30.8 % — ABNORMAL LOW (ref 36.0–46.0)
Hemoglobin: 10.1 g/dL — ABNORMAL LOW (ref 12.0–15.0)
MCH: 29.9 pg (ref 26.0–34.0)
MCHC: 32.8 g/dL (ref 30.0–36.0)
MCV: 91.1 fL (ref 78.0–100.0)
RDW: 14.6 % (ref 11.5–15.5)

## 2010-12-06 LAB — GLUCOSE, CAPILLARY: Glucose-Capillary: 134 mg/dL — ABNORMAL HIGH (ref 70–99)

## 2010-12-07 LAB — CBC
Hemoglobin: 9.1 g/dL — ABNORMAL LOW (ref 12.0–15.0)
MCH: 29.6 pg (ref 26.0–34.0)
MCV: 91.9 fL (ref 78.0–100.0)
Platelets: 269 10*3/uL (ref 150–400)
RBC: 3.07 MIL/uL — ABNORMAL LOW (ref 3.87–5.11)
WBC: 14.1 10*3/uL — ABNORMAL HIGH (ref 4.0–10.5)

## 2010-12-07 LAB — GLUCOSE, CAPILLARY: Glucose-Capillary: 140 mg/dL — ABNORMAL HIGH (ref 70–99)

## 2010-12-08 NOTE — Op Note (Signed)
Cheryl Hernandez NO.:  [id number]  MEDICAL RECORD NO.:  [id number]  LOCATION:  1605                         FACILITY:  [facility]  PHYSICIAN:  Ollen Gross, M.D.    DATE OF BIRTH:  07-Nov-1940  DATE OF PROCEDURE: DATE OF DISCHARGE:                              OPERATIVE REPORT   PREOPERATIVE DIAGNOSIS:  Osteoarthritis of right hip.  POSTOPERATIVE DIAGNOSIS:  Osteoarthritis of right hip.  PROCEDURE:  Right total hip arthroplasty.  SURGEON:  Ollen Gross, M.D.  ASSISTANT:  Alexzandrew L. Perkins, P.A.C.  ANESTHESIA:  General.  ESTIMATED BLOOD LOSS:  350.  DRAIN:  Hemovac x1.  COMPLICATIONS:  None.  CONDITION:  Stable to recovery room.  BRIEF CLINICAL NOTE:  [name] is a 70 year old female with advanced end-stage arthritis of the right hip with progressively worsening pain and dysfunction.  She has bone-on-bone arthritis and has failed long- term nonoperative management.  She is unable to ambulate or do activities of daily living without significant discomfort.  She has no contraindications to surgery and presents now for right total hip arthroplasty.  PROCEDURE IN DETAIL:  After successful administration of general anesthetic, the patient was placed in left lateral decubitus position with the right side up and held the hip positioner.  Right lower extremity was isolated from perineum with plastic drapes and prepped and draped in the usual sterile fashion.  Short posterolateral incision was made with 10 blade through subcutaneous tissue to the level of the fascia lata, which was incised in line with the skin incision.  The sciatic nerve was palpated and protected and the short rotators and capsule were isolated off the femur.  Hip was then dislocated and the center of femoral head was marked.  Trial prosthesis was placed such that the center of the trial head corresponds to the center of her native femoral head.  Osteotomy line was marked on  the femoral neck and osteotomy made with an oscillating saw.  Femoral head was removed and then the femoral retractors were placed to gain access to the proximal femur.  The canal finder was passed into the proximal femur and then the canal thoroughly irrigated to remove fatty contents.  Axial reaming was performed up to 13.5 mm, and proximal reaming up to the 20 F and the sleeve machined to a small.  A 20 F small trial sleeve was then placed.  The femur was retracted anteriorly to gain acetabular exposure. Acetabular retractors were placed and labrum and osteophytes were removed.  Acetabular reaming was performed up to 51 mm replacement of a 52-mm Pinnacle acetabular shell.  The shell was impacted in anatomic position with excellent purchase.  We did not place any additional dome screws.  The Apex hole eliminator was placed and then the 32 mm neutral +4 Marathon liner was placed.  The trial femur was placed, which was an 18 x 13 stem with a 36+8 neck about 5 degrees beyond native anteversion.  The 32.0 trial head was placed.  The hips reduced with outstanding stability.  Full extension, full external rotation, 70 degrees flexion, 40 degrees adduction, 90 degrees internal rotation, 90 degrees of flexion, and 70 degrees of internal rotation.  By  placing the right leg on top of the left, showed leg lengths were equal.  The trials were removed and the permanent 18 F small sleeve was placed with 18 x 13 stem and 36+8 neck 5 degrees beyond native anteversion.  The 32.0 ceramic head was placed in the hips reduced same stability parameters.  The wounds copiously irrigated with saline solution and short rotators and capsule were reattached to the femur through drill holes with Ethibond suture.  Fascia lata was closed over Hemovac drain with interrupted #1 Vicryl.  The Exparel was then injected, 20 mL of Exparel mixed with of 50 mL of saline.  I then injected into the fascia lata of the  gluteal muscles and the subcutaneous tissues.  Subcutaneous was then closed with interrupted #1 and interrupted 2-0 Vicryl.  Subcuticular was closed with running 4-0 Monocryl.  Drains hooked to suction.  Incision was cleaned and dried and Steri-Strips and a bulky sterile dressing were applied.  She was then placed into a knee immobilizer, awakened, and transported to recovery in stable condition.     Ollen Gross, M.D.     FA/MEDQ  D:  12/04/2010  T:  12/04/2010  Job:  [id number]  Electronically Signed by Ollen Gross M.D. on 12/08/2010 12:17:47 PM

## 2010-12-18 NOTE — Discharge Summary (Signed)
Cheryl Hernandez, Cheryl Hernandez             ACCOUNT NO.:  [id number]  MEDICAL RECORD NO.:  [id number]  LOCATION:  1605                         FACILITY:  [facility]  PHYSICIAN:  Alexzandrew L. Perkins, P.A.C.DATE OF BIRTH:  27-Oct-1940  DATE OF ADMISSION:  12/04/2010 DATE OF DISCHARGE:  12/07/2010                              DISCHARGE SUMMARY   POSSIBLE DATE OF DISCHARGE:  December 07, 2010  ADMITTING DIAGNOSES: 1. Osteoarthritis, right hip. 2. Migraines. 3. History of asthma. 4. History of bronchitis. 5. Chronic obstructive pulmonary disease. 6. Cataracts. 7. Hypertension. 8. Coronary arterial disease. 9. History of myocardial infarction September 2009. 10.Cardiac murmur. 11.Hypercholesterolemia. 12.Varicose veins. 13.Reflux disease. 14.History of gastritis. 15.History of diverticulosis. 16.Diet-controlled diabetes mellitus. 17.Osteopenia. 18.Postmenopausal. 19.Childhood illnesses of measles, mumps, and scarlet fever.  DISCHARGE DIAGNOSES: 1. Osteoarthritis, right hip.  Status post right total hip replacement     arthroplasty. 2. Migraines. 3. History of asthma. 4. History of bronchitis. 5. Chronic obstructive pulmonary disease. 6. Cataracts. 7. Hypertension. 8. Coronary arterial disease. 9. History of myocardial infarction September 2009. 10.Cardiac murmur. 11.Hypercholesterolemia. 12.Varicose veins. 13.Reflux disease. 14.History of gastritis. 15.History of diverticulosis. 16.Diet-controlled diabetes mellitus. 17.Osteopenia. 18.Postmenopausal. 19.Childhood illnesses of measles, mumps, and scarlet fever.  PROCEDURE:  December 04, 2010, right total hip.  Surgeon:  Dr. Homero Fellers Kirsta Hernandez.  Assistant:  Cheryl Peace, PA-C.  Anesthesia:  General.  Blood loss: 350 mL.  CONSULTS:  None.  BRIEF HISTORY:  Cheryl Hernandez is a 70 year old female with advanced arthritis of the right hip with progressive worsening pain and dysfunction.  She has failed nonoperative management.  She has  bone-on- bone arthritis, unable to ambulate and do activities of daily living without significant discomfort, and now presents for hip replacement. No contraindications to the upcoming surgery.  LABORATORY DATA:  CBC on admission:  Hemoglobin 12.3, hematocrit 37.3, white cell count 11.4, platelets 273.  PT/INR 13.7 and 1.03 with a PTT of 33.  Chem panel on admission:  Slightly elevated calcium at 10.7. Remaining Chem panel all within normal limits.  Preop UA was negative. Blood group type A negative.  Nasal swabs were positive for Staph aureus but negative for MRSA.  Serial CBCs were followed.  Hemoglobin postop day 1 was 10 and on postop day 2, the H and H was 10.1 and 30.8.  Please note one last CBC on postop day 3 pending at the time of dictation. Serial BMETs were followed for 48 hours.  Electrolytes all remained within normal limits.  RADIOLOGIC DATA:  Right hip films dated November 22, 2010:  Right hip osteoarthritis.  Two-view chest November 22, 2010:  Stable, no acute findings.  Portable right hip and pelvis film on December 04, 2010, demonstrates no complications following right total hip arthroplasty.  HOSPITAL COURSE:  The patient was admitted to [facility], taken to OR, underwent above-stated procedure without complication.  The patient tolerated the procedure well, later was transferred to the recovery room and orthopedic floor, started on p.o. and IV analgesic pain control following surgery, and given 24 hours postop IV antibiotics.  She was started on Xarelto for DVT prophylaxis.  She had a decent night and was doing fairly well on the morning of day 1.  We got Social Work involved and also Building control surveyor.  She wanted to look into possible skilled nursing facility for inpatient rehab following her surgery depending on how well she progressed and if she could arrange home arrangements.  She was actually doing pretty well on the morning of day 1, although did  have some pain and soreness.  Hemovac drain placed at the time of surgery was pulled.  She had excellent urinary output. She was started back on all of her home medications.  Hemoglobin looked good.  By day 2, she was fairly sore after getting up.  She has walked about 200 feet.  On day 2, dressing was changed, incision looked good and hemoglobin was stable at 10.  There was still some decision of whether she was going to go home or skilled rehab.  There was a possibility if she did go to a skilled rehab, that she may go on Saturday, December 07, 2010.  The patient is progressing at this time and if a bed became available and she elected to go to skilled facility, then she will be transferred out possibly on Saturday, December 07, 2010, and therefore arrangements are being made with discharge summary at this time.  DISCHARGE PLAN: 1. Possible transfer on December 07, 2010, to skilled facility or     possibly even home if doing fantastic and meets all goals. 2. Discharge diagnoses:  Please see above.  DISCHARGE MEDICATIONS: 1. Dilaudid 2 mg 1 to 2 tablets every 4 hours as needed for pain. 2. Robaxin 500 mg 1 p.o. q.6 h p.r.n. spasms. 3. Xarelto 10 mg p.o. daily for 3 weeks and discontinue the Xarelto. 4. Albuterol inhaler 1 to 2 puffs every 4 hours as needed. 5. Allegra 180 mg tablet, 1 tablet daily. 6. Ambien 10 mg 1 p.o. daily at bedtime as needed. 7. Bystolic 5 mg 1 tablet every morning. 8. Guaifenesin 400 mg 1 tablet twice a day. 9. Hydrochlorothiazide 12.5 mg every other day. 10.Metformin 500 mg 1 tablet daily at bedtime. 11.Milk of magnesia 1 to 2 teaspoons by mouth every 6 hours as needed. 12.Pravastatin 40 mg 1 tablet daily at bedtime. 13.Symbicort 80/4.5 mcg 1 puff inhaled twice daily.  DIET:  Heart-healthy diet.  ACTIVITY:  She is partial weightbearing 25% to 50% to the right lower extremity, hip precautions total hip protocol.  Continue PT and OT for gait training,  ambulation, ADLs, range of motion and strengthening exercises.  Please note the patient may start showering on Saturday, December 07, 2010.  DISPOSITION:  Pending at the time of dictation, waiting on final bed offers.  FOLLOWUP:  Follow up in 2 weeks with Dr. Lequita Halt in the office at [facility].  Call the office for an appointment at [phone] and help arrange appointment for follow up care of the patient 2 weeks from surgery.  CONDITION ON DISCHARGE:  Pending at the time of dictation but will be transferred possibly Saturday as long as she continues to improve.  Dictated For:  Gus Rankin. Caitlynne Harbeck, MD     Alexzandrew L. Perkins, P.A.C.     ALP/MEDQ  D:  12/06/2010  T:  12/06/2010  Job:  [id number]  cc:   Charlcie Cradle. Delford Field, MD, FCCP [address] [place] [address]  Lyn Records, M.D. Fax: [phone]  Oretha Milch, MD [address] [place] [address]  Electronically Signed by Patrica Duel P.A.C. on 12/12/2010 10:50:41 AM Electronically Signed by Ollen Gross M.D. on 12/18/2010  10:07:46 AM

## 2010-12-31 LAB — GLUCOSE, CAPILLARY
Glucose-Capillary: 119 — ABNORMAL HIGH
Glucose-Capillary: 109 — ABNORMAL HIGH

## 2011-01-01 LAB — CARDIAC PANEL(CRET KIN+CKTOT+MB+TROPI)
CK, MB: 4.2 — ABNORMAL HIGH
CK, MB: 5 — ABNORMAL HIGH
CK, MB: 5.2 — ABNORMAL HIGH
Relative Index: INVALID
Relative Index: INVALID
Total CK: 101
Total CK: 74
Troponin I: 0.24 — ABNORMAL HIGH
Troponin I: 0.36 — ABNORMAL HIGH

## 2011-01-01 LAB — CBC
HCT: 33.5 — ABNORMAL LOW
HCT: 35.6 — ABNORMAL LOW
HCT: 38.8
Hemoglobin: 11.4 — ABNORMAL LOW
Hemoglobin: 12
MCHC: 33.6
MCHC: 33.7
MCHC: 33.9
MCHC: 34
MCV: 91.3
MCV: 91.7
MCV: 91.9
MCV: 92.2
Platelets: 267
Platelets: 268
RBC: 3.63 — ABNORMAL LOW
RBC: 3.79 — ABNORMAL LOW
RDW: 14.4
RDW: 14.5
RDW: 14.6
RDW: 14.8
WBC: 12.5 — ABNORMAL HIGH

## 2011-01-01 LAB — COMPREHENSIVE METABOLIC PANEL
ALT: 16
Albumin: 3.9
Alkaline Phosphatase: 78
Calcium: 9.6
Potassium: 3.7
Sodium: 137
Total Protein: 7.4

## 2011-01-01 LAB — BASIC METABOLIC PANEL
CO2: 25
Calcium: 9.1
Chloride: 105
GFR calc Af Amer: >60
Glucose, Bld: 105 — ABNORMAL HIGH
Potassium: 3.9
Sodium: 140

## 2011-01-01 LAB — POCT I-STAT, CHEM 8
Calcium, Ion: 1.15
Creatinine, Ser: 0.9
Glucose, Bld: 110 — ABNORMAL HIGH
Hemoglobin: 13.3
TCO2: 24

## 2011-01-01 LAB — DIFFERENTIAL
Basophils Absolute: 0.1
Basophils Relative: 1
Eosinophils Absolute: 0.1
Eosinophils Relative: 1
Neutrophils Relative %: 80 — ABNORMAL HIGH

## 2011-01-01 LAB — LIPID PANEL
LDL Cholesterol: 222 — ABNORMAL HIGH
Total CHOL/HDL Ratio: 6.3
Triglycerides: 158 — ABNORMAL HIGH
VLDL: 32

## 2011-01-01 LAB — URINALYSIS, ROUTINE W REFLEX MICROSCOPIC
Glucose, UA: NEGATIVE
Hgb urine dipstick: NEGATIVE
Ketones, ur: NEGATIVE
Protein, ur: NEGATIVE
Urobilinogen, UA: 0.2

## 2011-01-01 LAB — GLUCOSE, CAPILLARY: Glucose-Capillary: 122 — ABNORMAL HIGH

## 2011-01-01 LAB — HEMOGLOBIN A1C: Hgb A1c MFr Bld: 6.2 — ABNORMAL HIGH

## 2011-02-13 ENCOUNTER — Ambulatory Visit (INDEPENDENT_AMBULATORY_CARE_PROVIDER_SITE_OTHER): Payer: Medicare Other | Admitting: Adult Health

## 2011-02-13 ENCOUNTER — Encounter: Payer: Self-pay | Admitting: Adult Health

## 2011-02-13 ENCOUNTER — Other Ambulatory Visit: Payer: Self-pay | Admitting: Critical Care Medicine

## 2011-02-13 DIAGNOSIS — J45909 Unspecified asthma, uncomplicated: Secondary | ICD-10-CM

## 2011-02-13 MED ORDER — LEVALBUTEROL HCL 0.63 MG/3ML IN NEBU
0.6300 mg | INHALATION_SOLUTION | Freq: Once | RESPIRATORY_TRACT | Status: AC
  Administered 2011-02-13: 0.63 mg via RESPIRATORY_TRACT

## 2011-02-13 MED ORDER — LEVALBUTEROL HCL 0.63 MG/3ML IN NEBU: 0.6300 mg | INHALATION_SOLUTION | Freq: Once | RESPIRATORY_TRACT | Status: DC

## 2011-02-13 MED ORDER — MOXIFLOXACIN HCL 400 MG PO TABS: 400.0000 mg | ORAL_TABLET | Freq: Every day | ORAL | Status: AC

## 2011-02-13 NOTE — Patient Instructions (Addendum)
Avelox 400mg  daily for 7 days  Mucinex DM Twice daily  As needed  Cough/congestion  Fluids and rest  Please contact office for sooner follow up if symptoms do not improve or worsen or seek emergency care  follow up Dr. Delford Field  As planned and As needed

## 2011-02-13 NOTE — Assessment & Plan Note (Signed)
Flare with associated URI  Pt says she can not take cephlasporins/PCN  Will old on steroids since recent hip replacements  Plan;  Avelox 400mg  daily for 7 days  Mucinex DM Twice daily  As needed  Cough/congestion  Fluids and rest  Please contact office for sooner follow up if symptoms do not improve or worsen or seek emergency care

## 2011-02-13 NOTE — Progress Notes (Signed)
Subjective:    Patient ID: Cheryl Hernandez, female    DOB: 09-Mar-1941, 70 y.o.   MRN: [id number]  HPI  70 y.o.   Female with moderate persistent asthma   09/13/2010 Has been doing well, less cough and wheezing.  Has been using symbicort prn  >no changes   02/13/2011 Acute OV  Complains of prod cough with yellow/green mucus, tightness in chest, wheezing, increased SOB, chills, left ear discomfort x1week, worse this AM. Taking symbicort every day.  No chest pain or edema. Feels bad today . No fever.  OTC meds not helping.  Mucus is thick esp in am.  Had recent hip replacment 2 months ago-doing very well .   Past Medical History  Diagnosis Date  . Asthma   . HTN (hypertension)   . Paroxysmal tachycardia   . GERD (gastroesophageal reflux disease)   . Diabetes insipidus   . Rhinitis   . Hyperlipidemia   . Arthritis   . COPD (chronic obstructive pulmonary disease)   . CAD (coronary artery disease)   . Diverticulosis   . Chronic diarrhea      Family History  Problem Relation Age of Onset  . Allergies Mother   . Prostate cancer Maternal Grandfather      History   Social History  . Marital Status: Widowed    Spouse Name: N/A    Number of Children: N/A  . Years of Education: N/A   Occupational History  . retired Charity fundraiser    Social History Main Topics  . Smoking status: Former Smoker -- 1.0 packs/day for 25 years    Types: Cigarettes    Quit date: 03/31/1998  . Smokeless tobacco: Never Used  . Alcohol Use: Not on file  . Drug Use: Not on file  . Sexually Active: Not on file   Other Topics Concern  . Not on file   Social History Narrative  . No narrative on file     Allergies  Allergen Reactions  . Cefuroxime Axetil   . Cephalexin   . Doxycycline     REACTION: vomiting/ GI upset     Outpatient Prescriptions Prior to Visit  Medication Sig Dispense Refill  . albuterol (PROAIR HFA) 108 (90 BASE) MCG/ACT inhaler Inhale 2 puffs into the lungs every 6 (six)  hours as needed.        Marland Kitchen aspirin 81 MG tablet Take 81 mg by mouth daily.        . B Complex-Biotin-FA (B COMPLETE) TABS Take 1 tablet by mouth daily.        . budesonide-formoterol (SYMBICORT) 80-4.5 MCG/ACT inhaler Inhale 1 puff into the lungs 2 (two) times daily.  1 Inhaler  6  . calcium carbonate (TUMS - DOSED IN MG ELEMENTAL CALCIUM) 500 MG chewable tablet Chew 1 tablet by mouth as needed.       . cholecalciferol (VITAMIN D) 1000 UNITS tablet Take 2,000 Units by mouth daily.       . Coenzyme Q10 (CO Q10) 100 MG TABS Take 1 tablet by mouth daily.        Marland Kitchen dextromethorphan-guaiFENesin (MUCINEX DM) 30-600 MG per 12 hr tablet Take 1 tablet by mouth every 12 (twelve) hours.        . Doxylamine Succinate, Sleep, (UNISOM) 25 MG tablet Take 12.5 mg by mouth at bedtime as needed.       . fexofenadine (ALLEGRA) 180 MG tablet Take 180 mg by mouth daily.        . Glucosamine  500 MG CAPS Take 1 capsule by mouth daily.        . Glucose Blood (FREESTYLE LITE TEST VI) Once daily      . hydrochlorothiazide (HYDRODIURIL) 12.5 MG tablet Every other day      . Melatonin-Pyridoxine 5-10 MG TABS Take 1 tablet by mouth as needed.        . metFORMIN (GLUMETZA) 500 MG (MOD) 24 hr tablet Take 500 mg by mouth daily with breakfast.        . Multiple Vitamins-Minerals (MULTIVITAMIN WITH MINERALS) tablet Take 1 tablet by mouth daily.        . nebivolol (BYSTOLIC) 5 MG tablet Take 5 mg by mouth daily.        Marland Kitchen omega-3 acid ethyl esters (LOVAZA) 1 G capsule Take 1 g by mouth daily.        Marland Kitchen omeprazole (PRILOSEC) 20 MG capsule Take 20 mg by mouth daily as needed.       . pravastatin (PRAVACHOL) 40 MG tablet Take 40 mg by mouth daily.        Marland Kitchen zolpidem (AMBIEN) 10 MG tablet As needed       No facility-administered medications prior to visit.      Review of Systems  Constitutional:   No  weight loss, night sweats,  Fevers, chills   ++, fatigue, lassitude. HEENT:   No headaches,  Difficulty swallowing,  Tooth/dental  problems,  Sore throat,                No sneezing, itching, ear ache,  ++nasal congestion, post nasal drip,   CV:  No chest pain,  Orthopnea, PND, swelling in lower extremities, anasarca, dizziness, palpitations  GI  No heartburn, indigestion, abdominal pain, nausea, vomiting, diarrhea, change in bowel habits, loss of appetite  Resp:  ,  No coughing up of blood.     No chest wall deformity  Skin: no rash or lesions.  GU: no dysuria, change in color of urine, no urgency or frequency.  No flank pain.  MS:  No joint pain or swelling.  No decreased range of motion.  No back pain.  Psych:  No change in mood or affect. No depression or anxiety.  No memory loss.     Objective:   Physical Exam  Filed Vitals:   02/13/11 1621  BP: 120/82  Pulse: 96  Temp: 98 F (36.7 C)  TempSrc: Oral  Height: 5\' 4"  (1.626 m)  Weight: 167 lb 9.6 oz (76.023 kg)  SpO2: 97%    Gen: Pleasant, well-nourished, in no distress,  normal affect  ENT: No lesions,  mouth clear,  oropharynx clear, no postnasal drip  Neck: No JVD, no TMG, no carotid bruits  Lungs: No use of accessory muscles, no dullness to percussion, coarse BS w Burman Foster exp wheeze   Cardiovascular: RRR, heart sounds normal, no murmur or gallops, no peripheral edema  Abdomen: soft and NT, no HSM,  BS normal  Musculoskeletal: No deformities, no cyanosis or clubbing  Neuro: alert, non focal  Skin: Warm, no lesions or rashes        Assessment & Plan:   No problem-specific assessment & plan notes found for this encounter.   Updated Medication List Outpatient Encounter Prescriptions as of 02/13/2011  Medication Sig Dispense Refill  . albuterol (PROAIR HFA) 108 (90 BASE) MCG/ACT inhaler Inhale 2 puffs into the lungs every 6 (six) hours as needed.        Marland Kitchen aspirin 81 MG tablet  Take 81 mg by mouth daily.        . B Complex-Biotin-FA (B COMPLETE) TABS Take 1 tablet by mouth daily.        . budesonide-formoterol (SYMBICORT) 80-4.5  MCG/ACT inhaler Inhale 1 puff into the lungs 2 (two) times daily.  1 Inhaler  6  . calcium carbonate (TUMS - DOSED IN MG ELEMENTAL CALCIUM) 500 MG chewable tablet Chew 1 tablet by mouth as needed.       . cholecalciferol (VITAMIN D) 1000 UNITS tablet Take 2,000 Units by mouth daily.       . Coenzyme Q10 (CO Q10) 100 MG TABS Take 1 tablet by mouth daily.        Marland Kitchen dextromethorphan-guaiFENesin (MUCINEX DM) 30-600 MG per 12 hr tablet Take 1 tablet by mouth every 12 (twelve) hours.        . Doxylamine Succinate, Sleep, (UNISOM) 25 MG tablet Take 12.5 mg by mouth at bedtime as needed.       . fexofenadine (ALLEGRA) 180 MG tablet Take 180 mg by mouth daily.        . Glucosamine 500 MG CAPS Take 1 capsule by mouth daily.        . Glucose Blood (FREESTYLE LITE TEST VI) Once daily      . hydrochlorothiazide (HYDRODIURIL) 12.5 MG tablet Every other day      . Melatonin-Pyridoxine 5-10 MG TABS Take 1 tablet by mouth as needed.        . metFORMIN (GLUMETZA) 500 MG (MOD) 24 hr tablet Take 500 mg by mouth daily with breakfast.        . Multiple Vitamins-Minerals (MULTIVITAMIN WITH MINERALS) tablet Take 1 tablet by mouth daily.        . nebivolol (BYSTOLIC) 5 MG tablet Take 5 mg by mouth daily.        Marland Kitchen omega-3 acid ethyl esters (LOVAZA) 1 G capsule Take 1 g by mouth daily.        Marland Kitchen omeprazole (PRILOSEC) 20 MG capsule Take 20 mg by mouth daily as needed.       . pravastatin (PRAVACHOL) 40 MG tablet Take 40 mg by mouth daily.        Marland Kitchen zolpidem (AMBIEN) 10 MG tablet As needed      . moxifloxacin (AVELOX) 400 MG tablet Take 1 tablet (400 mg total) by mouth daily.  7 tablet  0   Facility-Administered Encounter Medications as of 02/13/2011  Medication Dose Route Frequency Provider Last Rate Last Dose  . levalbuterol (XOPENEX) nebulizer solution 0.63 mg  0.63 mg Nebulization Once Ransome Helwig, NP      . levalbuterol (XOPENEX) nebulizer solution 0.63 mg  0.63 mg Nebulization Once Rubye Oaks, NP

## 2011-09-09 ENCOUNTER — Ambulatory Visit (INDEPENDENT_AMBULATORY_CARE_PROVIDER_SITE_OTHER): Payer: Medicare Other | Admitting: *Deleted

## 2011-09-09 DIAGNOSIS — I6529 Occlusion and stenosis of unspecified carotid artery: Secondary | ICD-10-CM

## 2011-09-18 ENCOUNTER — Other Ambulatory Visit: Payer: Self-pay | Admitting: *Deleted

## 2011-09-18 DIAGNOSIS — I6529 Occlusion and stenosis of unspecified carotid artery: Secondary | ICD-10-CM

## 2011-09-22 ENCOUNTER — Encounter: Payer: Self-pay | Admitting: Vascular Surgery

## 2011-09-22 NOTE — Procedures (Unsigned)
CAROTID DUPLEX EXAM  INDICATION:  Carotid disease.  HISTORY: Diabetes:  Yes. Cardiac:  CAD, stent. Hypertension:  Yes. Smoking:  Previous. Previous Surgery:  No. CV History:  Currently asymptomatic. Amaurosis Fugax No, Paresthesias No, Hemiparesis No                                      RIGHT             LEFT Brachial systolic pressure:         130               140 Brachial Doppler waveforms:         Normal            Normal Vertebral direction of flow:        Antegrade         Antegrade DUPLEX VELOCITIES (cm/sec) CCA peak systolic                   85                87 ECA peak systolic                   101               100 ICA peak systolic                   68                146 ICA end diastolic                   26                44 PLAQUE MORPHOLOGY:                  Heterogeneous     Heterogeneous PLAQUE AMOUNT:                      Mild              Mild PLAQUE LOCATION:                    ICA/ECA/CCA       ICA/ECA  IMPRESSION: 1. 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 2. No significant change noted when compared to the previous exam on     09/04/2010.  ___________________________________________ Larina Earthly, M.D.  CH/MEDQ  D:  09/12/2011  T:  09/12/2011  Job:  [id number]

## 2011-09-29 ENCOUNTER — Ambulatory Visit (INDEPENDENT_AMBULATORY_CARE_PROVIDER_SITE_OTHER): Payer: Medicare Other | Admitting: Critical Care Medicine

## 2011-09-29 ENCOUNTER — Encounter: Payer: Self-pay | Admitting: Critical Care Medicine

## 2011-09-29 VITALS — BP 142/78 | HR 93 | Temp 97.7°F | Ht 64.0 in | Wt 179.6 lb

## 2011-09-29 DIAGNOSIS — J45909 Unspecified asthma, uncomplicated: Secondary | ICD-10-CM

## 2011-09-29 MED ORDER — OMEPRAZOLE 20 MG PO CPDR: 20.0000 mg | DELAYED_RELEASE_CAPSULE | Freq: Every day | ORAL | Status: DC | PRN

## 2011-09-29 MED ORDER — PREDNISONE 10 MG PO TABS: ORAL_TABLET | ORAL | Status: DC

## 2011-09-29 MED ORDER — AZITHROMYCIN 250 MG PO TABS: 250.0000 mg | ORAL_TABLET | Freq: Every day | ORAL | Status: AC

## 2011-09-29 MED ORDER — BUDESONIDE-FORMOTEROL FUMARATE 80-4.5 MCG/ACT IN AERO: 1.0000 | INHALATION_SPRAY | Freq: Two times a day (BID) | RESPIRATORY_TRACT | Status: DC

## 2011-09-29 MED ORDER — PREDNISONE 10 MG PO TABS: 10.0000 mg | ORAL_TABLET | Freq: Every day | ORAL | Status: DC

## 2011-09-29 NOTE — Patient Instructions (Addendum)
Prednisone 10mg  Take 4 for two days 3 for two days 2 for two days 1 for two days and stop SYmbicort one puff twice daily Azithromycin 250mg   Take two once then one daily until gone Return 4 months or sooner if needed

## 2011-09-29 NOTE — Assessment & Plan Note (Signed)
Severe persistent asthma with acute bronchitic flare Plan Azithromycin x 5 days Pulse prednisone No change in inhaled meds.

## 2011-09-29 NOTE — Progress Notes (Signed)
Subjective:    Patient ID: Cheryl Hernandez, female    DOB: 15-Sep-1940, 71 y.o.   MRN: [id number]  HPI  71 y.o.   Female with moderate persistent asthma   09/29/2011 Pt now worse, for about two months.  Pt with coughing more.  Mucus is yellow green.  Pt notes more chest tightness, more dyspnea.  No flares since 6/12.     Past Medical History  Diagnosis Date  . Asthma   . HTN (hypertension)   . Paroxysmal tachycardia   . GERD (gastroesophageal reflux disease)   . Diabetes insipidus   . Rhinitis   . Hyperlipidemia   . Arthritis   . COPD (chronic obstructive pulmonary disease)   . CAD (coronary artery disease)   . Diverticulosis   . Chronic diarrhea      Family History  Problem Relation Age of Onset  . Allergies Mother   . Prostate cancer Maternal Grandfather      History   Social History  . Marital Status: Widowed    Spouse Name: N/A    Number of Children: N/A  . Years of Education: N/A   Occupational History  . retired [employer] fundraiser    Social History Main Topics  . Smoking status: Former Smoker -- 1.0 packs/day for 25 years    Types: Cigarettes    Quit date: 03/31/1998  . Smokeless tobacco: Never Used  . Alcohol Use: Not on file  . Drug Use: Not on file  . Sexually Active: Not on file   Other Topics Concern  . Not on file   Social History Narrative  . No narrative on file     Allergies  Allergen Reactions  . Cefuroxime Axetil   . Cephalexin   . Doxycycline     REACTION: vomiting/ GI upset     Outpatient Prescriptions Prior to Visit  Medication Sig Dispense Refill  . aspirin 81 MG tablet Take 81 mg by mouth daily. Two pills daily      . B Complex-Biotin-FA (B COMPLETE) TABS Take 1 tablet by mouth daily.        . calcium carbonate (TUMS - DOSED IN MG ELEMENTAL CALCIUM) 500 MG chewable tablet Chew 1 tablet by mouth as needed.       . cholecalciferol (VITAMIN D) 1000 UNITS tablet Take 2,000 Units by mouth daily.       . Coenzyme Q10 (CO Q10) 100 MG TABS Take  1 tablet by mouth daily.        Marland Kitchen dextromethorphan-guaiFENesin (MUCINEX DM) 30-600 MG per 12 hr tablet Take 1 tablet by mouth every 12 (twelve) hours.        . Doxylamine Succinate, Sleep, (UNISOM) 25 MG tablet Take 12.5 mg by mouth at bedtime as needed.       . fexofenadine (ALLEGRA) 180 MG tablet Take 180 mg by mouth daily.        . Glucosamine 500 MG CAPS Take 1 capsule by mouth daily.        . Glucose Blood (FREESTYLE LITE TEST VI) Once daily      . hydrochlorothiazide (HYDRODIURIL) 12.5 MG tablet Every other day      . Melatonin-Pyridoxine 5-10 MG TABS Take 1 tablet by mouth as needed.        . metFORMIN (GLUMETZA) 500 MG (MOD) 24 hr tablet Take 500 mg by mouth daily with breakfast.        . Multiple Vitamins-Minerals (MULTIVITAMIN WITH MINERALS) tablet Take 1 tablet by mouth daily.        Marland Kitchen  nebivolol (BYSTOLIC) 5 MG tablet Take 5 mg by mouth daily.        . pravastatin (PRAVACHOL) 40 MG tablet Take 40 mg by mouth daily.        Marland Kitchen PROAIR HFA 108 (90 BASE) MCG/ACT inhaler inhale 2 puffs by mouth every 4 to 6 hours if needed for wheezing  AND/OR SHORTNESS OF BREATH  8.5 g  0  . zolpidem (AMBIEN) 10 MG tablet As needed      . budesonide-formoterol (SYMBICORT) 80-4.5 MCG/ACT inhaler Inhale 1 puff into the lungs 2 (two) times daily.  1 Inhaler  6  . omeprazole (PRILOSEC) 20 MG capsule Take 20 mg by mouth daily as needed.       Marland Kitchen omega-3 acid ethyl esters (LOVAZA) 1 G capsule Take 1 g by mouth daily.            Review of Systems  Constitutional:   No  weight loss, night sweats,  Fevers, chills   ++, fatigue, lassitude. HEENT:   No headaches,  Difficulty swallowing,  Tooth/dental problems,  Sore throat,                No sneezing, itching, ear ache,  ++nasal congestion, post nasal drip,   CV:  No chest pain,  Orthopnea, PND, swelling in lower extremities, anasarca, dizziness, palpitations  GI  No heartburn, indigestion, abdominal pain, nausea, vomiting, diarrhea, change in bowel habits,  loss of appetite  Resp:  ,  No coughing up of blood.     No chest wall deformity  Skin: no rash or lesions.  GU: no dysuria, change in color of urine, no urgency or frequency.  No flank pain.  MS:  No joint pain or swelling.  No decreased range of motion.  No back pain.  Psych:  No change in mood or affect. No depression or anxiety.  No memory loss.     Objective:   Physical Exam  Filed Vitals:   09/29/11 1046  BP: 142/78  Pulse: 93  Temp: 97.7 F (36.5 C)  TempSrc: Oral  Height: 5\' 4"  (1.626 m)  Weight: 179 lb 9.6 oz (81.466 kg)  SpO2: 92%    Gen: Pleasant, well-nourished, in no distress,  normal affect  ENT: No lesions,  mouth clear,  oropharynx clear, no postnasal drip  Neck: No JVD, no TMG, no carotid bruits  Lungs: No use of accessory muscles, no dullness to percussion, coarse BS w Burman Foster exp wheeze   Cardiovascular: RRR, heart sounds normal, no murmur or gallops, no peripheral edema  Abdomen: soft and NT, no HSM,  BS normal  Musculoskeletal: No deformities, no cyanosis or clubbing  Neuro: alert, non focal  Skin: Warm, no lesions or rashes        Assessment & Plan:   ASTHMA, PERSISTENT, MODERATE Severe persistent asthma with acute bronchitic flare Plan Azithromycin x 5 days Pulse prednisone No change in inhaled meds.     Updated Medication List Outpatient Encounter Prescriptions as of 09/29/2011  Medication Sig Dispense Refill  . aspirin 81 MG tablet Take 81 mg by mouth daily. Two pills daily      . B Complex-Biotin-FA (B COMPLETE) TABS Take 1 tablet by mouth daily.        . budesonide-formoterol (SYMBICORT) 80-4.5 MCG/ACT inhaler Inhale 1 puff into the lungs 2 (two) times daily.  1 Inhaler  6  . calcium carbonate (TUMS - DOSED IN MG ELEMENTAL CALCIUM) 500 MG chewable tablet Chew 1 tablet by mouth as  needed.       . cholecalciferol (VITAMIN D) 1000 UNITS tablet Take 2,000 Units by mouth daily.       . Coenzyme Q10 (CO Q10) 100 MG TABS Take 1  tablet by mouth daily.        Marland Kitchen dextromethorphan-guaiFENesin (MUCINEX DM) 30-600 MG per 12 hr tablet Take 1 tablet by mouth every 12 (twelve) hours.        . Doxylamine Succinate, Sleep, (UNISOM) 25 MG tablet Take 12.5 mg by mouth at bedtime as needed.       . fexofenadine (ALLEGRA) 180 MG tablet Take 180 mg by mouth daily.        . Glucosamine 500 MG CAPS Take 1 capsule by mouth daily.        . Glucose Blood (FREESTYLE LITE TEST VI) Once daily      . hydrochlorothiazide (HYDRODIURIL) 12.5 MG tablet Every other day      . Melatonin-Pyridoxine 5-10 MG TABS Take 1 tablet by mouth as needed.        . metFORMIN (GLUMETZA) 500 MG (MOD) 24 hr tablet Take 500 mg by mouth daily with breakfast.        . Multiple Vitamins-Minerals (MULTIVITAMIN WITH MINERALS) tablet Take 1 tablet by mouth daily.        . nebivolol (BYSTOLIC) 5 MG tablet Take 5 mg by mouth daily.        Marland Kitchen omeprazole (PRILOSEC) 20 MG capsule Take 1 capsule (20 mg total) by mouth daily as needed.  30 capsule  6  . pravastatin (PRAVACHOL) 40 MG tablet Take 40 mg by mouth daily.        Marland Kitchen PROAIR HFA 108 (90 BASE) MCG/ACT inhaler inhale 2 puffs by mouth every 4 to 6 hours if needed for wheezing  AND/OR SHORTNESS OF BREATH  8.5 g  0  . zolpidem (AMBIEN) 10 MG tablet As needed      . DISCONTD: budesonide-formoterol (SYMBICORT) 80-4.5 MCG/ACT inhaler Inhale 1 puff into the lungs 2 (two) times daily.  1 Inhaler  6  . DISCONTD: budesonide-formoterol (SYMBICORT) 80-4.5 MCG/ACT inhaler Inhale 1 puff into the lungs 2 (two) times daily.  1 Inhaler  6  . DISCONTD: omeprazole (PRILOSEC) 20 MG capsule Take 20 mg by mouth daily as needed.       Marland Kitchen azithromycin (ZITHROMAX) 250 MG tablet Take 1 tablet (250 mg total) by mouth daily. Take two once then one daily until gone  6 each  0  . predniSONE (DELTASONE) 10 MG tablet Take 4 for two days three for two days two for two days one for two days  20 tablet  0  . DISCONTD: omega-3 acid ethyl esters (LOVAZA) 1 G  capsule Take 1 g by mouth daily.        Marland Kitchen DISCONTD: predniSONE (DELTASONE) 10 MG tablet Take 1 tablet (10 mg total) by mouth daily.  10 tablet  0

## 2011-12-26 ENCOUNTER — Ambulatory Visit: Payer: Medicare Other | Admitting: Critical Care Medicine

## 2012-01-02 ENCOUNTER — Emergency Department (HOSPITAL_COMMUNITY): Payer: Medicare Other

## 2012-01-02 ENCOUNTER — Emergency Department (HOSPITAL_COMMUNITY)
Admission: EM | Admit: 2012-01-02 | Discharge: 2012-01-02 | Disposition: A | Discharge status: Home / Self Care | Payer: Medicare Other | Attending: Emergency Medicine | Admitting: Emergency Medicine

## 2012-01-02 ENCOUNTER — Encounter (HOSPITAL_COMMUNITY): Payer: Self-pay | Admitting: *Deleted

## 2012-01-02 DIAGNOSIS — M549 Dorsalgia, unspecified: Secondary | ICD-10-CM | POA: Insufficient documentation

## 2012-01-02 DIAGNOSIS — M129 Arthropathy, unspecified: Secondary | ICD-10-CM | POA: Insufficient documentation

## 2012-01-02 DIAGNOSIS — M25559 Pain in unspecified hip: Secondary | ICD-10-CM | POA: Insufficient documentation

## 2012-01-02 DIAGNOSIS — J4489 Other specified chronic obstructive pulmonary disease: Secondary | ICD-10-CM | POA: Insufficient documentation

## 2012-01-02 DIAGNOSIS — E785 Hyperlipidemia, unspecified: Secondary | ICD-10-CM | POA: Insufficient documentation

## 2012-01-02 DIAGNOSIS — E119 Type 2 diabetes mellitus without complications: Secondary | ICD-10-CM | POA: Insufficient documentation

## 2012-01-02 DIAGNOSIS — I2510 Atherosclerotic heart disease of native coronary artery without angina pectoris: Secondary | ICD-10-CM | POA: Insufficient documentation

## 2012-01-02 DIAGNOSIS — Z79899 Other long term (current) drug therapy: Secondary | ICD-10-CM | POA: Insufficient documentation

## 2012-01-02 DIAGNOSIS — J449 Chronic obstructive pulmonary disease, unspecified: Secondary | ICD-10-CM | POA: Insufficient documentation

## 2012-01-02 DIAGNOSIS — I10 Essential (primary) hypertension: Secondary | ICD-10-CM | POA: Insufficient documentation

## 2012-01-02 DIAGNOSIS — K219 Gastro-esophageal reflux disease without esophagitis: Secondary | ICD-10-CM | POA: Insufficient documentation

## 2012-01-02 DIAGNOSIS — Z7982 Long term (current) use of aspirin: Secondary | ICD-10-CM | POA: Insufficient documentation

## 2012-01-02 LAB — CBC WITH DIFFERENTIAL/PLATELET
Basophils Absolute: 0 10*3/uL (ref 0.0–0.1)
Basophils Relative: 0 % (ref 0–1)
Hemoglobin: 13.6 g/dL (ref 12.0–15.0)
MCHC: 33.7 g/dL (ref 30.0–36.0)
Neutro Abs: 7.4 10*3/uL (ref 1.7–7.7)
Neutrophils Relative %: 71 % (ref 43–77)
RDW: 13.9 % (ref 11.5–15.5)

## 2012-01-02 LAB — BASIC METABOLIC PANEL WITH GFR
BUN: 17 mg/dL (ref 6–23)
CO2: 28 meq/L (ref 19–32)
Calcium: 9.9 mg/dL (ref 8.4–10.5)
Chloride: 99 meq/L (ref 96–112)
Creatinine, Ser: 0.69 mg/dL (ref 0.50–1.10)
GFR calc Af Amer: >90 mL/min
GFR calc non Af Amer: 84 mL/min — ABNORMAL LOW
Glucose, Bld: 148 mg/dL — ABNORMAL HIGH (ref 70–99)
Potassium: 3.8 meq/L (ref 3.5–5.1)
Sodium: 137 meq/L (ref 135–145)

## 2012-01-02 LAB — URINALYSIS, ROUTINE W REFLEX MICROSCOPIC
Ketones, ur: NEGATIVE mg/dL
Leukocytes, UA: NEGATIVE
Nitrite: NEGATIVE
Specific Gravity, Urine: 1.016 (ref 1.005–1.030)
pH: 6 (ref 5.0–8.0)

## 2012-01-02 MED ORDER — HYDROMORPHONE HCL PF 1 MG/ML IJ SOLN
1.0000 mg | Freq: Once | INTRAMUSCULAR | Status: AC
  Administered 2012-01-02: 1 mg via INTRAVENOUS
  Filled 2012-01-02: qty 1

## 2012-01-02 MED ORDER — HYDROMORPHONE HCL PF 1 MG/ML IJ SOLN
0.5000 mg | Freq: Once | INTRAMUSCULAR | Status: AC
  Administered 2012-01-02: 0.5 mg via INTRAMUSCULAR
  Filled 2012-01-02: qty 1

## 2012-01-02 MED ORDER — IBUPROFEN 800 MG PO TABS
800.0000 mg | ORAL_TABLET | Freq: Once | ORAL | Status: AC
  Administered 2012-01-02: 800 mg via ORAL
  Filled 2012-01-02: qty 1

## 2012-01-02 MED ORDER — HYDROMORPHONE HCL 2 MG PO TABS: 2.0000 mg | ORAL_TABLET | ORAL | Status: DC | PRN

## 2012-01-02 NOTE — ED Provider Notes (Signed)
History     CSN: [id number]  Arrival date & time 01/02/12  0908   First MD Initiated Contact with Patient 01/02/12 [phone]      Chief Complaint  Patient presents with  . Hip Pain    right    (Consider location/radiation/quality/duration/timing/severity/associated sxs/prior treatment) Patient is a 71 y.o. female presenting with hip pain. The history is provided by the patient.  Hip Pain This is a new problem. Pertinent negatives include no chest pain and no abdominal pain.   patient has pain in her right hip. She states it feels like a deep muscle pain. She had this hip replaced approximately year ago. She's had some pain since the replacement, but is worse last week. Some relief with ibuprofen. Laying flat improves the pain. Sitting up more painful. She states she's went to the symphony yesterday and thinks that may have made it worse. No numbness or weakness. She states she feels if she qualified for for pain. No loss of bladder or bowel control. No trauma. No injury. She's not on pain medications at home besides ibuprofen.  Past Medical History  Diagnosis Date  . Asthma   . HTN (hypertension)   . Paroxysmal tachycardia   . GERD (gastroesophageal reflux disease)   . Diabetes insipidus   . Rhinitis   . Hyperlipidemia   . Arthritis   . COPD (chronic obstructive pulmonary disease)   . CAD (coronary artery disease)   . Diverticulosis   . Chronic diarrhea     Past Surgical History  Procedure Date  . Total abdominal hysterectomy   . Tonsillectomy   . Angioplasty   . Total hip arthroplasty     Family History  Problem Relation Age of Onset  . Allergies Mother   . Prostate cancer Maternal Grandfather     History  Substance Use Topics  . Smoking status: Former Smoker -- 1.0 packs/day for 25 years    Types: Cigarettes    Quit date: 03/31/1998  . Smokeless tobacco: Never Used  . Alcohol Use: Not on file    OB History    Grav Para Term Preterm Abortions TAB SAB Ect Mult  Living                  Review of Systems  Constitutional: Negative for chills.  Cardiovascular: Negative for chest pain.  Gastrointestinal: Negative for abdominal pain.  Genitourinary: Negative for vaginal discharge and vaginal pain.  Musculoskeletal: Positive for gait problem. Negative for myalgias, back pain and joint swelling.  Neurological: Negative for weakness and numbness.    Allergies  Cefuroxime axetil; Cephalexin; Codeine; and Doxycycline  Home Medications   Current Outpatient Rx  Name Route Sig Dispense Refill  . ALBUTEROL SULFATE HFA 108 (90 BASE) MCG/ACT IN AERS Inhalation Inhale 2 puffs into the lungs every 6 (six) hours as needed. For shortness of breath.    . ASPIRIN EC 81 MG PO TBEC Oral Take 162 mg by mouth daily.    . B COMPLEX-C PO TABS Oral Take 1 tablet by mouth 2 (two) times a week.    . BUDESONIDE-FORMOTEROL FUMARATE 80-4.5 MCG/ACT IN AERO Inhalation Inhale 1 puff into the lungs 2 (two) times daily. 1 Inhaler 6  . CALCIUM CARBONATE ANTACID 500 MG PO CHEW Oral Chew 1 tablet by mouth 3 (three) times daily as needed. For heartburn.    Marland Kitchen VITAMIN D 1000 UNITS PO TABS Oral Take 2,000 Units by mouth daily.     . CO Q10 100 MG  PO TABS Oral Take 1 tablet by mouth daily.      Marland Kitchen DM-GUAIFENESIN ER 30-600 MG PO TB12 Oral Take 1 tablet by mouth every 12 (twelve) hours.      Marland Kitchen DOXYLAMINE SUCCINATE (SLEEP) 25 MG PO TABS Oral Take 12.5 mg by mouth at bedtime as needed. For sleep.    Marland Kitchen FEXOFENADINE HCL 180 MG PO TABS Oral Take 180 mg by mouth daily.      Marland Kitchen GLUCOSAMINE 500 MG PO CAPS Oral Take 1 capsule by mouth daily.      Marland Kitchen HYDROCHLOROTHIAZIDE 12.5 MG PO TABS Oral Take 12.5 mg by mouth daily.     Marland Kitchen METFORMIN HCL ER (MOD) 500 MG PO TB24 Oral Take 500 mg by mouth daily with breakfast.      . MULTI-VITAMIN/MINERALS PO TABS Oral Take 1 tablet by mouth daily.      . NEBIVOLOL HCL 5 MG PO TABS Oral Take 5 mg by mouth daily.      Marland Kitchen OMEPRAZOLE 20 MG PO CPDR Oral Take 20 mg by  mouth daily as needed. For indigestion.    Marland Kitchen ZOLPIDEM TARTRATE 10 MG PO TABS Oral Take 10 mg by mouth at bedtime as needed. For sleep.    Marland Kitchen FREESTYLE LITE TEST VI  Once daily    . HYDROMORPHONE HCL 2 MG PO TABS Oral Take 1 tablet (2 mg total) by mouth every 4 (four) hours as needed for pain. 15 tablet 0    BP 119/43  Pulse 67  Temp 98.2 F (36.8 C) (Oral)  Resp 18  SpO2 97%  Physical Exam  Nursing note and vitals reviewed. Constitutional: She is oriented to person, place, and time. She appears well-developed and well-nourished.  Cardiovascular: Normal rate, regular rhythm and normal heart sounds.   No murmur heard. Pulmonary/Chest: Effort normal and breath sounds normal. No respiratory distress. She has no wheezes. She has no rales.  Abdominal: Soft. Bowel sounds are normal. She exhibits no distension. There is no tenderness. There is no rebound and no guarding.  Musculoskeletal: Normal range of motion.       No tenderness to right hip to her lower spine. Range of motion intact in right hip. No bruising. No rash. Neurovascular intact distally. No peripheral edema. Dorsalis pedis pulses intact. Sensation is intact in foot.  Neurological: She is alert and oriented to person, place, and time. No cranial nerve deficit.  Skin: Skin is warm and dry.  Psychiatric: She has a normal mood and affect. Her speech is normal.    ED Course  Procedures (including critical care time)  Labs Reviewed  BASIC METABOLIC PANEL - Abnormal; Notable for the following:    Glucose, Bld 148 (*)     GFR calc non Af Amer 84 (*)     All other components within normal limits  CBC WITH DIFFERENTIAL  URINALYSIS, ROUTINE W REFLEX MICROSCOPIC   Dg Hip Complete Right  01/02/2012  *RADIOLOGY REPORT*  Clinical Data: Right hip pain, known injury  RIGHT HIP - COMPLETE 2+ VIEW  Comparison: Plain films 09/05 1012 through the  Findings: Right hip total arthroplasty is noted.  No change in the the positioning of the  prosthetic components.  No evidence of fracture or  dislocation.  Left appears normal.  No pelvic or sacral abnormality.  IMPRESSION: No acute osseous abnormality.   Original Report Authenticated By: Genevive Bi, M.D.      1. Back pain       MDM  Patient  with back pain. Also involves hip. Negative x-ray. No trauma. It initially was not improved with treatment, were held as since improved with IV Dilaudid. Patient is ambulated and will followup with her primary care or orthopedic surgeon as needed.        Juliet Rude. Rubin Payor, MD 01/02/12 1644

## 2012-01-02 NOTE — ED Notes (Signed)
Patient made aware MD Rubin Payor will be in to see her. Pt is back from x-ray

## 2012-01-02 NOTE — ED Notes (Signed)
[id number]<AV> Expected date:01/02/12<BR> Expected time: 8:53 AM<BR> Means of arrival:Ambulance<BR> Comments:<BR> 68 female hip pain from home.

## 2012-01-02 NOTE — ED Notes (Signed)
Patient reports mild improvement with pain medication. Pt in no apparent distress.

## 2012-01-02 NOTE — ED Notes (Signed)
Patient reports pain to right hip starting a week ago, but worse this AM. Pt reports pain with ambulation and sitting on toilet this AM. Pt reports she is able to ambulate, but pain becomes severe. Pt has been taking ibuprofen at home with last dose last night. Pt reports history of hip replacement September 2012 to right hip.  Pt reports pain is mildly improved with laying down and worse with sitting and ambulation. Pt denies radiation of pain and has no noted swelling or redness to area. No rotation or deformity. Pt able to move all digits, Strong pedal pulse noted.

## 2012-01-02 NOTE — ED Notes (Signed)
Per EMS. Pt is from home with complaint of right hip pain. Pt denies fall. Pt reports hip was replaced in September 2012. Pt reports pain since replacement, but worse in the last week. Pt has been using ibuprofen.  Pt reports laying flat improves pain. Pt states sitting up is painful.  Pt states pain is in "soft tissue."

## 2012-01-02 NOTE — ED Notes (Signed)
MD ok with patient eating and drinking.  Pt given a sandwich and drink.

## 2012-01-02 NOTE — ED Notes (Signed)
Patient able to ambulate without assistance and put weight onto right leg with minimal pain. Pt reports pain has substantially improved.

## 2012-01-16 ENCOUNTER — Ambulatory Visit (INDEPENDENT_AMBULATORY_CARE_PROVIDER_SITE_OTHER): Payer: Medicare Other | Admitting: Critical Care Medicine

## 2012-01-16 ENCOUNTER — Encounter: Payer: Self-pay | Admitting: Critical Care Medicine

## 2012-01-16 VITALS — BP 138/80 | HR 87 | Temp 98.0°F | Ht 63.5 in | Wt 180.4 lb

## 2012-01-16 DIAGNOSIS — J45909 Unspecified asthma, uncomplicated: Secondary | ICD-10-CM

## 2012-01-16 NOTE — Progress Notes (Signed)
Subjective:    Patient ID: Cheryl Hernandez, female    DOB: [birth year], 72 y.o.   MRN: [id number]  HPI  71 y.o.   Female with moderate persistent asthma   09/29/2011 Pt now worse, for about two months.  Pt with coughing more.  Mucus is yellow green.  Pt notes more chest tightness, more dyspnea.  No flares since 6/12.    01/16/2012 Started coughing up yellow mucus and blood streaked one week ago, then better. No epistaxsis.  No nasal discharge. No real chest pain, just pressure. No real wheeze.  Notes mild edema in feet.       PUL ASTHMA HISTORY 01/16/2012 09/14/2010 09/13/2010  Symptoms 0-2 days/week 0-2 days/week >2 days/week  Nighttime awakenings 0-2/month 0-2/month 0-2/month  Interference with activity No limitations No limitations No limitations  SABA use 0-2 days/wk 0-2 days/wk 0-2 days/wk  Exacerbations requiring oral steroids 0-1 / year 0-1 / year 0-1 / year    Past Medical History  Diagnosis Date  . Asthma   . HTN (hypertension)   . Paroxysmal tachycardia   . GERD (gastroesophageal reflux disease)   . Diabetes insipidus   . Rhinitis   . Hyperlipidemia   . Arthritis   . COPD (chronic obstructive pulmonary disease)   . CAD (coronary artery disease)   . Diverticulosis   . Chronic diarrhea      Family History  Problem Relation Age of Onset  . Allergies Mother   . Prostate cancer Maternal Grandfather      History   Social History  . Marital Status: Widowed    Spouse Name: N/A    Number of Children: N/A  . Years of Education: N/A   Occupational History  . retired Charity fundraiser    Social History Main Topics  . Smoking status: Former Smoker -- 1.0 packs/day for 25 years    Types: Cigarettes    Quit date: 03/31/1998  . Smokeless tobacco: Never Used  . Alcohol Use: Not on file  . Drug Use: Not on file  . Sexually Active: Not on file   Other Topics Concern  . Not on file   Social History Narrative  . No narrative on file     Allergies  Allergen Reactions  .  Cefuroxime Axetil   . Cephalexin   . Codeine     Tolerates Dilaudid.  . Doxycycline     REACTION: vomiting/ GI upset     Outpatient Prescriptions Prior to Visit  Medication Sig Dispense Refill  . albuterol (PROVENTIL HFA;VENTOLIN HFA) 108 (90 BASE) MCG/ACT inhaler Inhale 2 puffs into the lungs every 6 (six) hours as needed. For shortness of breath.      Marland Kitchen aspirin EC 81 MG tablet Take 162 mg by mouth daily.      . B Complex-C (B-COMPLEX WITH VITAMIN C) tablet Take 1 tablet by mouth 2 (two) times a week.      . calcium carbonate (TUMS - DOSED IN MG ELEMENTAL CALCIUM) 500 MG chewable tablet Chew 1 tablet by mouth 3 (three) times daily as needed. For heartburn.      . cholecalciferol (VITAMIN D) 1000 UNITS tablet Take 2,000 Units by mouth daily.       . Coenzyme Q10 (CO Q10) 100 MG TABS Take 1 tablet by mouth daily.        Marland Kitchen dextromethorphan-guaiFENesin (MUCINEX DM) 30-600 MG per 12 hr tablet Take 1 tablet by mouth every 12 (twelve) hours.        Marland Kitchen  Doxylamine Succinate, Sleep, (UNISOM) 25 MG tablet Take 12.5 mg by mouth at bedtime as needed. For sleep.      . fexofenadine (ALLEGRA) 180 MG tablet Take 180 mg by mouth daily.        . Glucosamine 500 MG CAPS Take 1 capsule by mouth daily.        . Glucose Blood (FREESTYLE LITE TEST VI) Once daily      . hydrochlorothiazide (HYDRODIURIL) 12.5 MG tablet Take 12.5 mg by mouth daily.       Marland Kitchen HYDROmorphone (DILAUDID) 2 MG tablet Take 1 tablet (2 mg total) by mouth every 4 (four) hours as needed for pain.  15 tablet  0  . metFORMIN (GLUMETZA) 500 MG (MOD) 24 hr tablet Take 500 mg by mouth daily with breakfast.        . Multiple Vitamins-Minerals (MULTIVITAMIN WITH MINERALS) tablet Take 1 tablet by mouth daily.        . nebivolol (BYSTOLIC) 5 MG tablet Take 5 mg by mouth daily.        Marland Kitchen omeprazole (PRILOSEC) 20 MG capsule Take 20 mg by mouth daily as needed. For indigestion.      Marland Kitchen zolpidem (AMBIEN) 10 MG tablet Take 10 mg by mouth at bedtime as needed.  For sleep.      . budesonide-formoterol (SYMBICORT) 80-4.5 MCG/ACT inhaler Inhale 1 puff into the lungs 2 (two) times daily.  1 Inhaler  6      Review of Systems  Constitutional:   No  weight loss, night sweats,  Fevers, chills   ++, fatigue, lassitude. HEENT:   No headaches,  Difficulty swallowing,  Tooth/dental problems,  Sore throat,                No sneezing, itching, ear ache,  ++nasal congestion, post nasal drip,   CV:  No chest pain,  Orthopnea, PND, swelling in lower extremities, anasarca, dizziness, palpitations  GI  No heartburn, indigestion, abdominal pain, nausea, vomiting, diarrhea, change in bowel habits, loss of appetite  Resp:  ,  No coughing up of blood.     No chest wall deformity  Skin: no rash or lesions.  GU: no dysuria, change in color of urine, no urgency or frequency.  No flank pain.  MS:  No joint pain or swelling.  No decreased range of motion.  No back pain.  Psych:  No change in mood or affect. No depression or anxiety.  No memory loss.     Objective:   Physical Exam  Filed Vitals:   01/16/12 0919  BP: 138/80  Pulse: 87  Temp: 98 F (36.7 C)  TempSrc: Oral  Height: 5' 3.5" (1.613 m)  Weight: 180 lb 6.4 oz (81.829 kg)  SpO2: 95%    Gen: Pleasant, well-nourished, in no distress,  normal affect  ENT: No lesions,  mouth clear,  oropharynx clear, no postnasal drip  Neck: No JVD, no TMG, no carotid bruits  Lungs: No use of accessory muscles, no dullness to percussion, distant breath sounds  Cardiovascular: RRR, heart sounds normal, no murmur or gallops, no peripheral edema  Abdomen: soft and NT, no HSM,  BS normal  Musculoskeletal: No deformities, no cyanosis or clubbing  Neuro: alert, non focal  Skin: Warm, no lesions or rashes        Assessment & Plan:   ASTHMA, PERSISTENT, MODERATE Recent history of hemoptysis now resolved likely from lower airway inflammation and doubt any endobronchial lesion Moderate persistent asthma  stable  at this time Plan Maintain Symbicort 82 ventilations twice daily Continue antireflux measures Return 4 months    Updated Medication List Outpatient Encounter Prescriptions as of 01/16/2012  Medication Sig Dispense Refill  . albuterol (PROVENTIL HFA;VENTOLIN HFA) 108 (90 BASE) MCG/ACT inhaler Inhale 2 puffs into the lungs every 6 (six) hours as needed. For shortness of breath.      Marland Kitchen aspirin EC 81 MG tablet Take 162 mg by mouth daily.      . B Complex-C (B-COMPLEX WITH VITAMIN C) tablet Take 1 tablet by mouth 2 (two) times a week.      . budesonide-formoterol (SYMBICORT) 80-4.5 MCG/ACT inhaler Inhale 2 puffs into the lungs 2 (two) times daily.      . calcium carbonate (TUMS - DOSED IN MG ELEMENTAL CALCIUM) 500 MG chewable tablet Chew 1 tablet by mouth 3 (three) times daily as needed. For heartburn.      . cholecalciferol (VITAMIN D) 1000 UNITS tablet Take 2,000 Units by mouth daily.       . Coenzyme Q10 (CO Q10) 100 MG TABS Take 1 tablet by mouth daily.        Marland Kitchen dextromethorphan-guaiFENesin (MUCINEX DM) 30-600 MG per 12 hr tablet Take 1 tablet by mouth every 12 (twelve) hours.        . Doxylamine Succinate, Sleep, (UNISOM) 25 MG tablet Take 12.5 mg by mouth at bedtime as needed. For sleep.      . fexofenadine (ALLEGRA) 180 MG tablet Take 180 mg by mouth daily.        . Glucosamine 500 MG CAPS Take 1 capsule by mouth daily.        . Glucose Blood (FREESTYLE LITE TEST VI) Once daily      . hydrochlorothiazide (HYDRODIURIL) 12.5 MG tablet Take 12.5 mg by mouth daily.       Marland Kitchen HYDROmorphone (DILAUDID) 2 MG tablet Take 1 tablet (2 mg total) by mouth every 4 (four) hours as needed for pain.  15 tablet  0  . metFORMIN (GLUMETZA) 500 MG (MOD) 24 hr tablet Take 500 mg by mouth daily with breakfast.        . Multiple Vitamins-Minerals (MULTIVITAMIN WITH MINERALS) tablet Take 1 tablet by mouth daily.        . nebivolol (BYSTOLIC) 5 MG tablet Take 5 mg by mouth daily.        Marland Kitchen omeprazole  (PRILOSEC) 20 MG capsule Take 20 mg by mouth daily as needed. For indigestion.      Marland Kitchen zolpidem (AMBIEN) 10 MG tablet Take 10 mg by mouth at bedtime as needed. For sleep.      Marland Kitchen DISCONTD: budesonide-formoterol (SYMBICORT) 80-4.5 MCG/ACT inhaler Inhale 1 puff into the lungs 2 (two) times daily.  1 Inhaler  6

## 2012-01-16 NOTE — Assessment & Plan Note (Signed)
Recent history of hemoptysis now resolved likely from lower airway inflammation and doubt any endobronchial lesion Moderate persistent asthma stable at this time Plan Maintain Symbicort 82 ventilations twice daily Continue antireflux measures Return 4 months

## 2012-01-16 NOTE — Patient Instructions (Signed)
Stay on 80 symibicort two puff twice daily Return 4 months

## 2012-05-13 IMAGING — CR DG PORTABLE PELVIS
1 series · 1 of 1 positions shown · non-contrast
Comparison: Radiographs 11/22/2010.

CLINICAL DATA: Postop right total hip arthroplasty.

PORTABLE PELVIS

[AP]
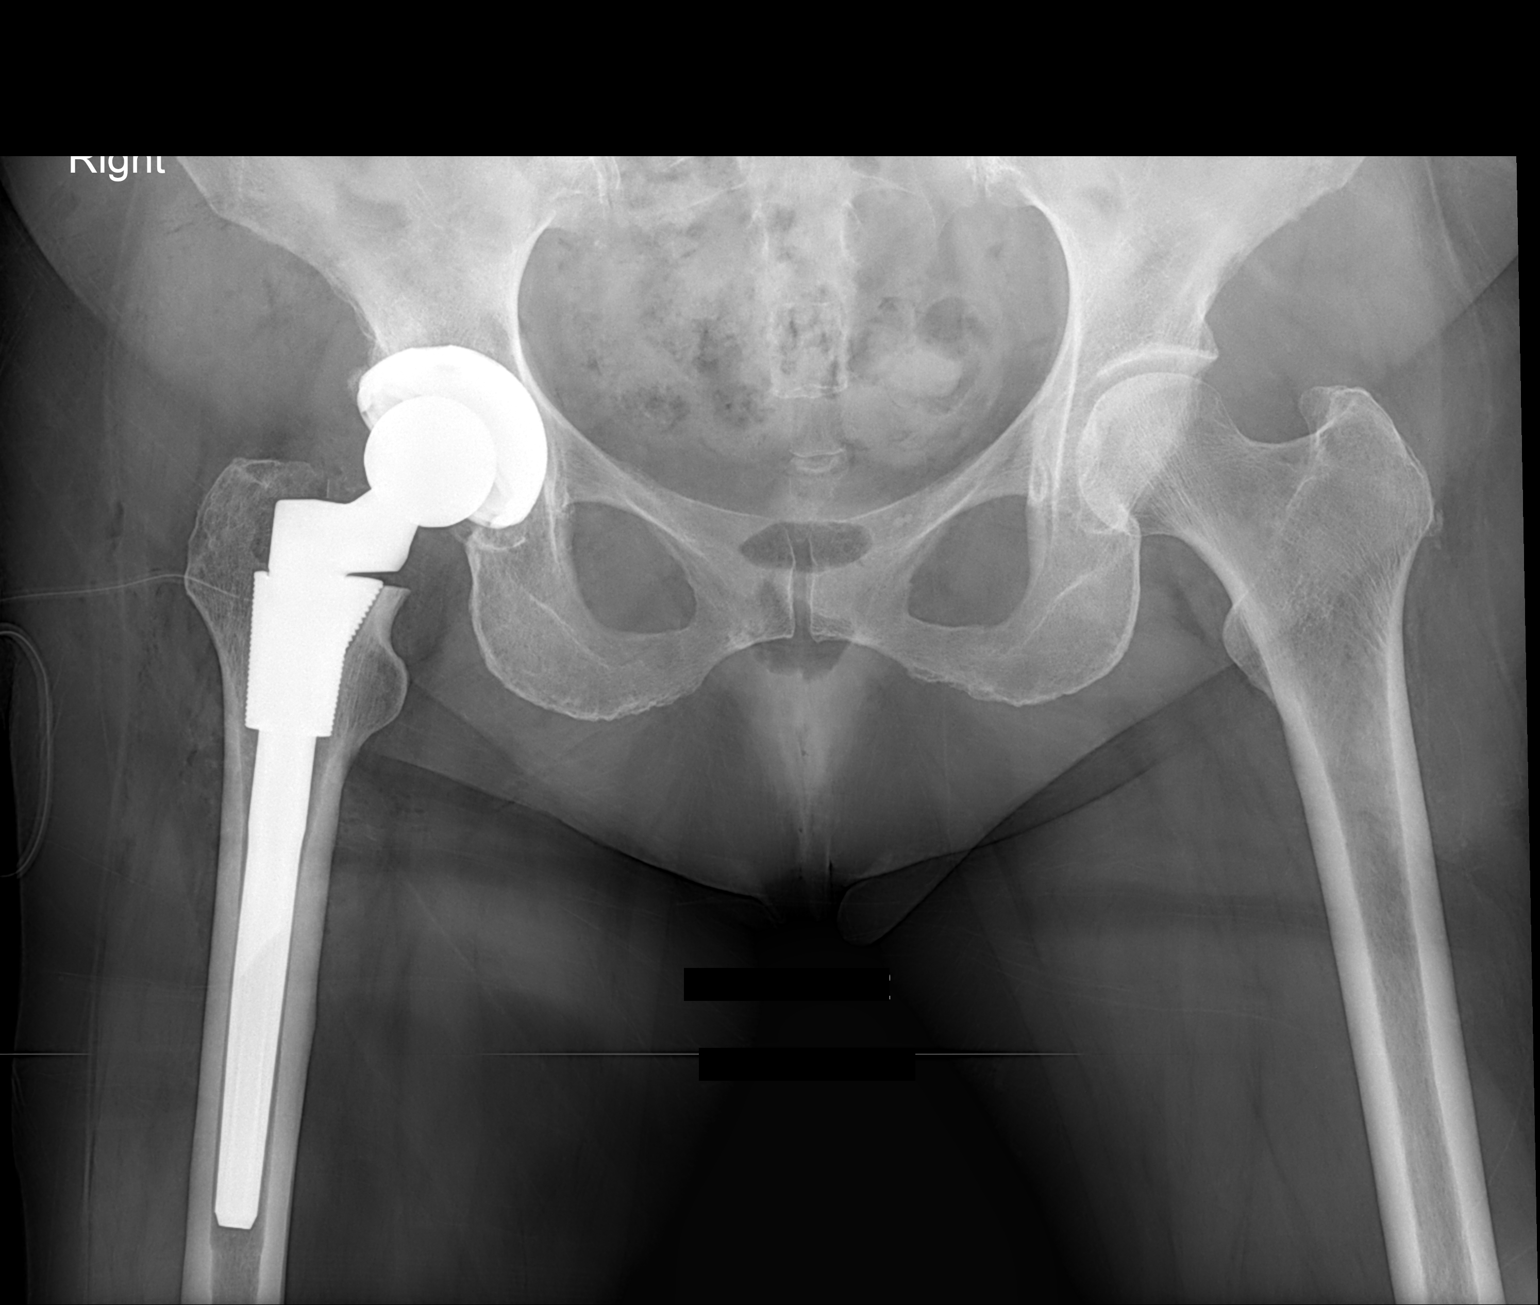

[1 of 1 positions shown; findings below may reference images not displayed]

FINDINGS: 4183 hours.  Patient is status post right total hip
arthroplasty.  Hardware appears well positioned.  There is no
evidence of acute fracture or dislocation.  A surgical drain is
present.  There is mild air in the soft tissues surrounding the
hip.
IMPRESSION: No demonstrated complication following right total hip
arthroplasty.

## 2012-08-04 ENCOUNTER — Ambulatory Visit (INDEPENDENT_AMBULATORY_CARE_PROVIDER_SITE_OTHER): Payer: Medicare Other | Admitting: Emergency Medicine

## 2012-08-04 ENCOUNTER — Telehealth: Payer: Self-pay | Admitting: Critical Care Medicine

## 2012-08-04 ENCOUNTER — Encounter: Payer: Self-pay | Admitting: Emergency Medicine

## 2012-08-04 VITALS — BP 130/80 | HR 81 | Temp 97.3°F | Ht 63.5 in | Wt 176.9 lb

## 2012-08-04 DIAGNOSIS — J45909 Unspecified asthma, uncomplicated: Secondary | ICD-10-CM

## 2012-08-04 DIAGNOSIS — J310 Chronic rhinitis: Secondary | ICD-10-CM

## 2012-08-04 MED ORDER — PREDNISONE 10 MG PO TABS: ORAL_TABLET | ORAL | Status: DC

## 2012-08-04 MED ORDER — LEVOFLOXACIN 500 MG PO TABS: 500.0000 mg | ORAL_TABLET | Freq: Every day | ORAL | Status: DC

## 2012-08-04 NOTE — Telephone Encounter (Signed)
LMTCB again! Only 1 spot left on the schedule today, so went ahead and scheduled her to see RB today at 4 pm

## 2012-08-04 NOTE — Telephone Encounter (Signed)
Pt returned call & accepted the appt w/ RB today @ 4:00 PM.  Pt stated nothing further needed at this time.  Antionette Fairy

## 2012-08-04 NOTE — Assessment & Plan Note (Addendum)
Flare in setting allergy season. Minimal wheeze on exam today but clear dyspnea and cough - prednisone - levaquin - rov PW

## 2012-08-04 NOTE — Assessment & Plan Note (Signed)
-   continue allegra - restart NSWs every day, at least through allergy season

## 2012-08-04 NOTE — Telephone Encounter (Signed)
Needs ov- sick, plus overdue for rov LMTCB

## 2012-08-04 NOTE — Patient Instructions (Addendum)
Please continue the symbicort 160 until it runs out, then go back to 80 Start nasal saline washes Continue allegra Take prednisone and levaquin as directed Follow with Dr Delford Field in 1 month

## 2012-08-04 NOTE — Progress Notes (Signed)
Subjective:    Patient ID: Cheryl Hernandez, female    DOB: [birth year], 72 y.o.   MRN: [id number]  HPI 72 y.o.   Female with moderate persistent asthma   09/29/2011 Pt now worse, for about two months.  Pt with coughing more.  Mucus is yellow green.  Pt notes more chest tightness, more dyspnea.  No flares since 6/12.    01/16/2012 Started coughing up yellow mucus and blood streaked one week ago, then better. No epistaxsis.  No nasal discharge. No real chest pain, just pressure. No real wheeze.  Notes mild edema in feet.       Acute Visit 08/04/12 --  Hx moderate persistent asthma, allergies, GERD, followed by Dr Delford Field. Presents today with several weeks of more nasal congestion, more cough productive of dark mucous, chest tightness and SOB. Has been on fexofenadine, saline spray (not washes), mucinex-D. On symbicort, increased it from 80 to 160 on her own. She has never been able to tolerate steroid nasal spray. She is using SABA daily (more than usual)   PUL ASTHMA HISTORY 01/16/2012 09/14/2010 09/13/2010  Symptoms 0-2 days/week 0-2 days/week >2 days/week  Nighttime awakenings 0-2/month 0-2/month 0-2/month  Interference with activity No limitations No limitations No limitations  SABA use 0-2 days/wk 0-2 days/wk 0-2 days/wk  Exacerbations requiring oral steroids 0-1 / year 0-1 / year 0-1 / year    Past Medical History  Diagnosis Date  . Asthma   . HTN (hypertension)   . Paroxysmal tachycardia   . GERD (gastroesophageal reflux disease)   . Diabetes insipidus   . Rhinitis   . Hyperlipidemia   . Arthritis   . COPD (chronic obstructive pulmonary disease)   . CAD (coronary artery disease)   . Diverticulosis   . Chronic diarrhea      Family History  Problem Relation Age of Onset  . Allergies Mother   . Prostate cancer Maternal Grandfather      History   Social History  . Marital Status: Widowed    Spouse Name: N/A    Number of Children: N/A  . Years of Education: N/A    Occupational History  . retired Charity fundraiser    Social History Main Topics  . Smoking status: Former Smoker -- 1.00 packs/day for 25 years    Types: Cigarettes    Quit date: 03/31/1998  . Smokeless tobacco: Never Used  . Alcohol Use: Not on file  . Drug Use: Not on file  . Sexually Active: Not on file   Other Topics Concern  . Not on file   Social History Narrative  . No narrative on file     Allergies  Allergen Reactions  . Cefuroxime Axetil   . Cephalexin   . Codeine     Tolerates Dilaudid.  . Doxycycline     REACTION: vomiting/ GI upset     Outpatient Prescriptions Prior to Visit  Medication Sig Dispense Refill  . albuterol (PROVENTIL HFA;VENTOLIN HFA) 108 (90 BASE) MCG/ACT inhaler Inhale 2 puffs into the lungs every 6 (six) hours as needed. For shortness of breath.      Marland Kitchen aspirin EC 81 MG tablet Take 162 mg by mouth daily.      . B Complex-C (B-COMPLEX WITH VITAMIN C) tablet Take 1 tablet by mouth 2 (two) times a week.      . calcium carbonate (TUMS - DOSED IN MG ELEMENTAL CALCIUM) 500 MG chewable tablet Chew 1 tablet by mouth 3 (three) times daily as needed. For  heartburn.      . cholecalciferol (VITAMIN D) 1000 UNITS tablet Take 2,000 Units by mouth daily.       . Coenzyme Q10 (CO Q10) 100 MG TABS Take 1 tablet by mouth daily.        Marland Kitchen dextromethorphan-guaiFENesin (MUCINEX DM) 30-600 MG per 12 hr tablet Take 1 tablet by mouth every 12 (twelve) hours.        . Doxylamine Succinate, Sleep, (UNISOM) 25 MG tablet Take 12.5 mg by mouth at bedtime as needed. For sleep.      . fexofenadine (ALLEGRA) 180 MG tablet Take 180 mg by mouth daily.        . Glucosamine 500 MG CAPS Take 1 capsule by mouth daily.        . Glucose Blood (FREESTYLE LITE TEST VI) Once daily      . hydrochlorothiazide (HYDRODIURIL) 12.5 MG tablet Take 12.5 mg by mouth daily.       . metFORMIN (GLUMETZA) 500 MG (MOD) 24 hr tablet Take 500 mg by mouth daily with breakfast.        . Multiple Vitamins-Minerals  (MULTIVITAMIN WITH MINERALS) tablet Take 1 tablet by mouth daily.        . nebivolol (BYSTOLIC) 5 MG tablet Take 5 mg by mouth daily.        Marland Kitchen omeprazole (PRILOSEC) 20 MG capsule Take 20 mg by mouth daily as needed. For indigestion.      Marland Kitchen zolpidem (AMBIEN) 10 MG tablet Take 10 mg by mouth at bedtime as needed. For sleep.      . budesonide-formoterol (SYMBICORT) 80-4.5 MCG/ACT inhaler Inhale 2 puffs into the lungs 2 (two) times daily.      Marland Kitchen HYDROmorphone (DILAUDID) 2 MG tablet Take 1 tablet (2 mg total) by mouth every 4 (four) hours as needed for pain.  15 tablet  0   No facility-administered medications prior to visit.      Review of Systems     Objective:   Physical Exam Filed Vitals:   08/04/12 1606  BP: 130/80  Pulse: 81  Temp: 97.3 F (36.3 C)  TempSrc: Oral  Height: 5' 3.5" (1.613 m)  Weight: 176 lb 14.4 oz (80.241 kg)  SpO2: 92%    Gen: Pleasant, well-nourished, in no distress,  normal affect  ENT: No lesions,  mouth clear,  oropharynx clear, no postnasal drip  Neck: No JVD, no TMG, no carotid bruits  Lungs: No use of accessory muscles, no dullness to percussion, distant breath sounds  Cardiovascular: RRR, heart sounds normal, no murmur or gallops, no peripheral edema  Abdomen: soft and NT, no HSM,  BS normal  Musculoskeletal: No deformities, no cyanosis or clubbing  Neuro: alert, non focal  Skin: Warm, no lesions or rashes        Assessment & Plan:   ASTHMA, PERSISTENT, MODERATE Flare in setting allergy season. Minimal wheeze on exam today but clear dyspnea and cough - prednisone - levaquin - rov PW   RHINITIS - continue allegra - restart NSWs every day, at least through allergy season    Updated Medication List Outpatient Encounter Prescriptions as of 08/04/2012  Medication Sig Dispense Refill  . albuterol (PROVENTIL HFA;VENTOLIN HFA) 108 (90 BASE) MCG/ACT inhaler Inhale 2 puffs into the lungs every 6 (six) hours as needed. For shortness of  breath.      Marland Kitchen aspirin EC 81 MG tablet Take 162 mg by mouth daily.      . B Complex-C (B-COMPLEX WITH VITAMIN  C) tablet Take 1 tablet by mouth 2 (two) times a week.      . budesonide-formoterol (SYMBICORT) 160-4.5 MCG/ACT inhaler Inhale 2 puffs into the lungs 2 (two) times daily.      . calcium carbonate (TUMS - DOSED IN MG ELEMENTAL CALCIUM) 500 MG chewable tablet Chew 1 tablet by mouth 3 (three) times daily as needed. For heartburn.      . cholecalciferol (VITAMIN D) 1000 UNITS tablet Take 2,000 Units by mouth daily.       . Coenzyme Q10 (CO Q10) 100 MG TABS Take 1 tablet by mouth daily.        Marland Kitchen dextromethorphan-guaiFENesin (MUCINEX DM) 30-600 MG per 12 hr tablet Take 1 tablet by mouth every 12 (twelve) hours.        . Doxylamine Succinate, Sleep, (UNISOM) 25 MG tablet Take 12.5 mg by mouth at bedtime as needed. For sleep.      . fexofenadine (ALLEGRA) 180 MG tablet Take 180 mg by mouth daily.        . Glucosamine 500 MG CAPS Take 1 capsule by mouth daily.        . Glucose Blood (FREESTYLE LITE TEST VI) Once daily      . hydrochlorothiazide (HYDRODIURIL) 12.5 MG tablet Take 12.5 mg by mouth daily.       . metFORMIN (GLUMETZA) 500 MG (MOD) 24 hr tablet Take 500 mg by mouth daily with breakfast.        . Multiple Vitamins-Minerals (MULTIVITAMIN WITH MINERALS) tablet Take 1 tablet by mouth daily.        . nebivolol (BYSTOLIC) 5 MG tablet Take 5 mg by mouth daily.        Marland Kitchen omeprazole (PRILOSEC) 20 MG capsule Take 20 mg by mouth daily as needed. For indigestion.      Marland Kitchen zolpidem (AMBIEN) 10 MG tablet Take 10 mg by mouth at bedtime as needed. For sleep.      . budesonide-formoterol (SYMBICORT) 80-4.5 MCG/ACT inhaler Inhale 2 puffs into the lungs 2 (two) times daily.      Marland Kitchen levofloxacin (LEVAQUIN) 500 MG tablet Take 1 tablet (500 mg total) by mouth daily.  5 tablet  0  . predniSONE (DELTASONE) 10 MG tablet Take 40mg  daily for 3 days, then 30mg  daily for 3 days, then 20mg  daily for 3 days, then 10mg   daily for 3 days, then stop  30 tablet  0  . [DISCONTINUED] HYDROmorphone (DILAUDID) 2 MG tablet Take 1 tablet (2 mg total) by mouth every 4 (four) hours as needed for pain.  15 tablet  0   No facility-administered encounter medications on file as of 08/04/2012.    ASTHMA, PERSISTENT, MODERATE Flare in setting allergy season. Minimal wheeze on exam today but clear dyspnea and cough - prednisone - levaquin - rov PW   RHINITIS - continue allegra - restart NSWs every day, at least through allergy season

## 2012-08-04 NOTE — Telephone Encounter (Signed)
Pt returning triage's call.  Holly D Pryor ° °

## 2012-08-30 ENCOUNTER — Ambulatory Visit: Payer: Medicare Other | Admitting: Neurosurgery

## 2012-08-30 ENCOUNTER — Other Ambulatory Visit: Payer: Medicare Other

## 2012-09-03 ENCOUNTER — Encounter: Payer: Self-pay | Admitting: Critical Care Medicine

## 2012-09-03 ENCOUNTER — Ambulatory Visit (INDEPENDENT_AMBULATORY_CARE_PROVIDER_SITE_OTHER): Payer: Medicare Other | Admitting: Critical Care Medicine

## 2012-09-03 VITALS — BP 138/84 | HR 90 | Temp 98.9°F | Ht 63.5 in | Wt 177.6 lb

## 2012-09-03 DIAGNOSIS — J45909 Unspecified asthma, uncomplicated: Secondary | ICD-10-CM

## 2012-09-03 MED ORDER — BUDESONIDE-FORMOTEROL FUMARATE 160-4.5 MCG/ACT IN AERO: 2.0000 | INHALATION_SPRAY | Freq: Two times a day (BID) | RESPIRATORY_TRACT | Status: DC

## 2012-09-03 MED ORDER — AZITHROMYCIN 250 MG PO TABS: 250.0000 mg | ORAL_TABLET | Freq: Every day | ORAL | Status: DC

## 2012-09-03 MED ORDER — FLUTTER DEVI: Status: DC

## 2012-09-03 MED ORDER — PREDNISONE 10 MG PO TABS: ORAL_TABLET | ORAL | Status: DC

## 2012-09-03 NOTE — Patient Instructions (Addendum)
Start Symbicort 160 two puff twice daily Prednisone 10mg  Take 4 for two days three for two days two for two days one for two days Use flutter valve 4 times daily for one week then 2-3 times daily A prescription for azithromycin is offered for travel Return 2 months

## 2012-09-03 NOTE — Assessment & Plan Note (Signed)
Moderate persistent asthma with recurrent flare Plan Start Symbicort 160 two puff twice daily Prednisone 10mg  Take 4 for two days three for two days two for two days one for two days Use flutter valve 4 times daily for one week then 2-3 times daily A prescription for azithromycin is offered for travel Return 2 months

## 2012-09-03 NOTE — Progress Notes (Signed)
Subjective:    Patient ID: Cheryl Hernandez, female    DOB: 01-29-1941, 72 y.o.   MRN: [id number]  HPI  72 y.o.   Female with moderate persistent asthma   09/03/2012 Chief Complaint  Patient presents with  . 1 month follow up    chest still feeling tight, wheezing, prod cough with clear to white mucus, and SOB "all the time."    Pt saw RB 08/04/12 and rx pred/levaquin Pt seen and now now coughing up green   Pt is still wheezing and tight.  Pt had a 160 symbicort and switched to 80 on her own Cough is better.  Less mucus, less green material    PUL ASTHMA HISTORY 09/03/2012 01/16/2012 09/14/2010 09/13/2010  Symptoms Daily 0-2 days/week 0-2 days/week >2 days/week  Nighttime awakenings 0-2/month 0-2/month 0-2/month 0-2/month  Interference with activity Some limitations No limitations No limitations No limitations  SABA use Daily 0-2 days/wk 0-2 days/wk 0-2 days/wk  Exacerbations requiring oral steroids 0-1 / year 0-1 / year 0-1 / year 0-1 / year    Past Medical History  Diagnosis Date  . Asthma   . HTN (hypertension)   . Paroxysmal tachycardia   . GERD (gastroesophageal reflux disease)   . Diabetes insipidus   . Rhinitis   . Hyperlipidemia   . Arthritis   . COPD (chronic obstructive pulmonary disease)   . CAD (coronary artery disease)   . Diverticulosis   . Chronic diarrhea      Family History  Problem Relation Age of Onset  . Allergies Mother   . Prostate cancer Maternal Grandfather      History   Social History  . Marital Status: Widowed    Spouse Name: N/A    Number of Children: N/A  . Years of Education: N/A   Occupational History  . retired Charity fundraiser    Social History Main Topics  . Smoking status: Former Smoker -- 1.00 packs/day for 25 years    Types: Cigarettes    Quit date: 03/31/1998  . Smokeless tobacco: Never Used  . Alcohol Use: Not on file  . Drug Use: Not on file  . Sexually Active: Not on file   Other Topics Concern  . Not on file   Social History  Narrative  . No narrative on file     Allergies  Allergen Reactions  . Cefuroxime Axetil   . Cephalexin   . Codeine     Tolerates Dilaudid.  . Doxycycline     REACTION: vomiting/ GI upset     Outpatient Prescriptions Prior to Visit  Medication Sig Dispense Refill  . albuterol (PROVENTIL HFA;VENTOLIN HFA) 108 (90 BASE) MCG/ACT inhaler Inhale 2 puffs into the lungs every 6 (six) hours as needed. For shortness of breath.      Marland Kitchen aspirin EC 81 MG tablet Take 162 mg by mouth daily.      . B Complex-C (B-COMPLEX WITH VITAMIN C) tablet Take 1 tablet by mouth 2 (two) times a week.      . calcium carbonate (TUMS - DOSED IN MG ELEMENTAL CALCIUM) 500 MG chewable tablet Chew 1 tablet by mouth 3 (three) times daily as needed. For heartburn.      . cholecalciferol (VITAMIN D) 1000 UNITS tablet Take 2,000 Units by mouth daily.       . Coenzyme Q10 (CO Q10) 100 MG TABS Take 1 tablet by mouth daily.        Marland Kitchen dextromethorphan-guaiFENesin (MUCINEX DM) 30-600 MG per 12 hr  tablet Take 1 tablet by mouth every 12 (twelve) hours.        . Doxylamine Succinate, Sleep, (UNISOM) 25 MG tablet Take 12.5 mg by mouth at bedtime as needed. For sleep.      . fexofenadine (ALLEGRA) 180 MG tablet Take 180 mg by mouth daily.        . Glucosamine 500 MG CAPS Take 1 capsule by mouth daily.        . Glucose Blood (FREESTYLE LITE TEST VI) Once daily      . hydrochlorothiazide (HYDRODIURIL) 12.5 MG tablet Take 12.5 mg by mouth daily.       . metFORMIN (GLUMETZA) 500 MG (MOD) 24 hr tablet Take 500 mg by mouth daily with breakfast.        . Multiple Vitamins-Minerals (MULTIVITAMIN WITH MINERALS) tablet Take 1 tablet by mouth daily.        . nebivolol (BYSTOLIC) 5 MG tablet Take 5 mg by mouth daily.        Marland Kitchen omeprazole (PRILOSEC) 20 MG capsule Take 20 mg by mouth daily as needed. For indigestion.      . budesonide-formoterol (SYMBICORT) 160-4.5 MCG/ACT inhaler Inhale 1 puff into the lungs 2 (two) times daily.       .  budesonide-formoterol (SYMBICORT) 80-4.5 MCG/ACT inhaler Inhale 2 puffs into the lungs 2 (two) times daily.      Marland Kitchen levofloxacin (LEVAQUIN) 500 MG tablet Take 1 tablet (500 mg total) by mouth daily.  5 tablet  0  . predniSONE (DELTASONE) 10 MG tablet Take 40mg  daily for 3 days, then 30mg  daily for 3 days, then 20mg  daily for 3 days, then 10mg  daily for 3 days, then stop  30 tablet  0  . zolpidem (AMBIEN) 10 MG tablet Take 10 mg by mouth at bedtime as needed. For sleep.       No facility-administered medications prior to visit.      Review of Systems  review of systems taken in detail and is negative except as noted above per history present illness     Objective:   Physical Exam  Filed Vitals:   09/03/12 1601  BP: 138/84  Pulse: 90  Temp: 98.9 F (37.2 C)  TempSrc: Oral  Height: 5' 3.5" (1.613 m)  Weight: 177 lb 9.6 oz (80.559 kg)  SpO2: 95%    Gen: Pleasant, well-nourished, in no distress,  normal affect  ENT: No lesions,  mouth clear,  oropharynx clear, no postnasal drip  Neck: No JVD, no TMG, no carotid bruits  Lungs: No use of accessory muscles, no dullness to percussion, distant breath sounds, expired wheezes  Cardiovascular: RRR, heart sounds normal, no murmur or gallops, no peripheral edema  Abdomen: soft and NT, no HSM,  BS normal  Musculoskeletal: No deformities, no cyanosis or clubbing  Neuro: alert, non focal  Skin: Warm, no lesions or rashes        Assessment & Plan:   ASTHMA, PERSISTENT, MODERATE Moderate persistent asthma with recurrent flare Plan Start Symbicort 160 two puff twice daily Prednisone 10mg  Take 4 for two days three for two days two for two days one for two days Use flutter valve 4 times daily for one week then 2-3 times daily A prescription for azithromycin is offered for travel Return 2 months     Updated Medication List Outpatient Encounter Prescriptions as of 09/03/2012  Medication Sig Dispense Refill  . albuterol  (PROVENTIL HFA;VENTOLIN HFA) 108 (90 BASE) MCG/ACT inhaler Inhale 2 puffs into the  lungs every 6 (six) hours as needed. For shortness of breath.      Marland Kitchen aspirin EC 81 MG tablet Take 162 mg by mouth daily.      . B Complex-C (B-COMPLEX WITH VITAMIN C) tablet Take 1 tablet by mouth 2 (two) times a week.      . calcium carbonate (TUMS - DOSED IN MG ELEMENTAL CALCIUM) 500 MG chewable tablet Chew 1 tablet by mouth 3 (three) times daily as needed. For heartburn.      . cholecalciferol (VITAMIN D) 1000 UNITS tablet Take 2,000 Units by mouth daily.       . Coenzyme Q10 (CO Q10) 100 MG TABS Take 1 tablet by mouth daily.        Marland Kitchen dextromethorphan-guaiFENesin (MUCINEX DM) 30-600 MG per 12 hr tablet Take 1 tablet by mouth every 12 (twelve) hours.        . Doxylamine Succinate, Sleep, (UNISOM) 25 MG tablet Take 12.5 mg by mouth at bedtime as needed. For sleep.      . fexofenadine (ALLEGRA) 180 MG tablet Take 180 mg by mouth daily.        . Glucosamine 500 MG CAPS Take 1 capsule by mouth daily.        . Glucose Blood (FREESTYLE LITE TEST VI) Once daily      . hydrochlorothiazide (HYDRODIURIL) 12.5 MG tablet Take 12.5 mg by mouth daily.       . metFORMIN (GLUMETZA) 500 MG (MOD) 24 hr tablet Take 500 mg by mouth daily with breakfast.        . Multiple Vitamins-Minerals (MULTIVITAMIN WITH MINERALS) tablet Take 1 tablet by mouth daily.        . nebivolol (BYSTOLIC) 5 MG tablet Take 5 mg by mouth daily.        Marland Kitchen omeprazole (PRILOSEC) 20 MG capsule Take 20 mg by mouth daily as needed. For indigestion.      . [DISCONTINUED] budesonide-formoterol (SYMBICORT) 160-4.5 MCG/ACT inhaler Inhale 1 puff into the lungs 2 (two) times daily.       . [DISCONTINUED] budesonide-formoterol (SYMBICORT) 80-4.5 MCG/ACT inhaler Inhale 2 puffs into the lungs 2 (two) times daily.      Marland Kitchen azithromycin (ZITHROMAX) 250 MG tablet Take 1 tablet (250 mg total) by mouth daily. Take two once then one daily until gone  6 each  0  .  budesonide-formoterol (SYMBICORT) 160-4.5 MCG/ACT inhaler Inhale 2 puffs into the lungs 2 (two) times daily.  1 Inhaler  12  . predniSONE (DELTASONE) 10 MG tablet Take 4 for two days three for two days two for two days one for two days  20 tablet  0  . Respiratory Therapy Supplies (FLUTTER) DEVI Use flutter valve 4 times daily  1 each  0  . [DISCONTINUED] levofloxacin (LEVAQUIN) 500 MG tablet Take 1 tablet (500 mg total) by mouth daily.  5 tablet  0  . [DISCONTINUED] predniSONE (DELTASONE) 10 MG tablet Take 40mg  daily for 3 days, then 30mg  daily for 3 days, then 20mg  daily for 3 days, then 10mg  daily for 3 days, then stop  30 tablet  0  . [DISCONTINUED] zolpidem (AMBIEN) 10 MG tablet Take 10 mg by mouth at bedtime as needed. For sleep.       No facility-administered encounter medications on file as of 09/03/2012.    ASTHMA, PERSISTENT, MODERATE Moderate persistent asthma with recurrent flare Plan Start Symbicort 160 two puff twice daily Prednisone 10mg  Take 4 for two days three for two days  two for two days one for two days Use flutter valve 4 times daily for one week then 2-3 times daily A prescription for azithromycin is offered for travel Return 2 months

## 2012-09-06 ENCOUNTER — Other Ambulatory Visit: Payer: Medicare Other

## 2012-09-06 ENCOUNTER — Ambulatory Visit: Payer: Medicare Other | Admitting: Neurosurgery

## 2012-10-05 ENCOUNTER — Other Ambulatory Visit: Payer: Medicare Other

## 2012-10-05 ENCOUNTER — Ambulatory Visit: Payer: Medicare Other | Admitting: Vascular Surgery

## 2012-10-29 ENCOUNTER — Ambulatory Visit: Payer: Medicare Other | Admitting: Critical Care Medicine

## 2012-11-02 ENCOUNTER — Other Ambulatory Visit: Payer: Medicare Other

## 2012-11-02 ENCOUNTER — Ambulatory Visit: Payer: Medicare Other | Admitting: Vascular Surgery

## 2012-11-23 ENCOUNTER — Ambulatory Visit: Payer: Medicare Other | Admitting: Vascular Surgery

## 2012-11-23 ENCOUNTER — Other Ambulatory Visit: Payer: Medicare Other

## 2012-11-24 ENCOUNTER — Ambulatory Visit: Payer: Medicare Other | Admitting: Critical Care Medicine

## 2012-12-01 ENCOUNTER — Encounter: Payer: Self-pay | Admitting: Critical Care Medicine

## 2012-12-01 ENCOUNTER — Ambulatory Visit (INDEPENDENT_AMBULATORY_CARE_PROVIDER_SITE_OTHER): Payer: Medicare Other | Admitting: Critical Care Medicine

## 2012-12-01 VITALS — BP 142/90 | HR 84 | Temp 98.2°F | Ht 63.5 in | Wt 176.4 lb

## 2012-12-01 DIAGNOSIS — Z23 Encounter for immunization: Secondary | ICD-10-CM

## 2012-12-01 DIAGNOSIS — R042 Hemoptysis: Secondary | ICD-10-CM

## 2012-12-01 DIAGNOSIS — J45909 Unspecified asthma, uncomplicated: Secondary | ICD-10-CM

## 2012-12-01 MED ORDER — BUDESONIDE-FORMOTEROL FUMARATE 80-4.5 MCG/ACT IN AERO: 2.0000 | INHALATION_SPRAY | Freq: Two times a day (BID) | RESPIRATORY_TRACT | Status: DC

## 2012-12-01 NOTE — Progress Notes (Signed)
Subjective:    Patient ID: Cheryl Hernandez, female    DOB: 1940/09/09, 72 y.o.   MRN: [id number]  HPI  72 y.o.   Female with moderate persistent asthma  12/01/2012 Chief Complaint  Patient presents with  . 2 month follow up    Has chest tightness, prod cough with occas bright red blood, wheezing, and DOE.    Occ mixed with mucus.  Smoker, quit in 2000.  Pt started coughing up blood intermittently.  Notes more wheeze and dyspnea.  Ragweed issues Notes some chest tightness, wheezing more  No qhs dyspnea, using SABA more  Flutter valve helps,  Using symbicort 80  PUL ASTHMA HISTORY 09/03/2012 01/16/2012 09/14/2010 09/13/2010  Symptoms Daily 0-2 days/week 0-2 days/week >2 days/week  Nighttime awakenings 0-2/month 0-2/month 0-2/month 0-2/month  Interference with activity Some limitations No limitations No limitations No limitations  SABA use Daily 0-2 days/wk 0-2 days/wk 0-2 days/wk  Exacerbations requiring oral steroids 0-1 / year 0-1 / year 0-1 / year 0-1 / year    Past Medical History  Diagnosis Date  . Asthma   . HTN (hypertension)   . Paroxysmal tachycardia   . GERD (gastroesophageal reflux disease)   . Diabetes insipidus   . Rhinitis   . Hyperlipidemia   . Arthritis   . COPD (chronic obstructive pulmonary disease)   . CAD (coronary artery disease)   . Diverticulosis   . Chronic diarrhea      Family History  Problem Relation Age of Onset  . Allergies Mother   . Prostate cancer Maternal Grandfather      History   Social History  . Marital Status: Widowed    Spouse Name: N/A    Number of Children: N/A  . Years of Education: N/A   Occupational History  . retired Charity fundraiser    Social History Main Topics  . Smoking status: Former Smoker -- 1.00 packs/day for 25 years    Types: Cigarettes    Quit date: 03/31/1998  . Smokeless tobacco: Never Used  . Alcohol Use: Not on file  . Drug Use: Not on file  . Sexual Activity: Not on file   Other Topics Concern  . Not on file    Social History Narrative  . No narrative on file     Allergies  Allergen Reactions  . Cefuroxime Axetil   . Cephalexin   . Codeine     Tolerates Dilaudid.  . Doxycycline     REACTION: vomiting/ GI upset     Outpatient Prescriptions Prior to Visit  Medication Sig Dispense Refill  . albuterol (PROVENTIL HFA;VENTOLIN HFA) 108 (90 BASE) MCG/ACT inhaler Inhale 2 puffs into the lungs every 6 (six) hours as needed. For shortness of breath.      Marland Kitchen aspirin EC 81 MG tablet Take 162 mg by mouth daily.      . B Complex-C (B-COMPLEX WITH VITAMIN C) tablet Take 1 tablet by mouth 2 (two) times a week.      . calcium carbonate (TUMS - DOSED IN MG ELEMENTAL CALCIUM) 500 MG chewable tablet Chew 1 tablet by mouth 3 (three) times daily as needed. For heartburn.      . cholecalciferol (VITAMIN D) 1000 UNITS tablet Take 2,000 Units by mouth daily.       . Coenzyme Q10 (CO Q10) 100 MG TABS Take 1 tablet by mouth daily.        Marland Kitchen dextromethorphan-guaiFENesin (MUCINEX DM) 30-600 MG per 12 hr tablet Take 1 tablet by mouth  every 12 (twelve) hours.        . Doxylamine Succinate, Sleep, (UNISOM) 25 MG tablet Take 12.5 mg by mouth at bedtime as needed. For sleep.      . fexofenadine (ALLEGRA) 180 MG tablet Take 180 mg by mouth daily.        . Glucosamine 500 MG CAPS Take 1 capsule by mouth daily.        . Glucose Blood (FREESTYLE LITE TEST VI) Once daily      . hydrochlorothiazide (HYDRODIURIL) 12.5 MG tablet Take 12.5 mg by mouth daily.       . metFORMIN (GLUMETZA) 500 MG (MOD) 24 hr tablet Take 500 mg by mouth daily with breakfast.        . Multiple Vitamins-Minerals (MULTIVITAMIN WITH MINERALS) tablet Take 1 tablet by mouth daily.        . nebivolol (BYSTOLIC) 5 MG tablet Take 5 mg by mouth daily.        Marland Kitchen omeprazole (PRILOSEC) 20 MG capsule Take 20 mg by mouth daily as needed. For indigestion.      Marland Kitchen Respiratory Therapy Supplies (FLUTTER) DEVI Use flutter valve 4 times daily  1 each  0  . azithromycin  (ZITHROMAX) 250 MG tablet Take 1 tablet (250 mg total) by mouth daily. Take two once then one daily until gone  6 each  0  . budesonide-formoterol (SYMBICORT) 160-4.5 MCG/ACT inhaler Inhale 2 puffs into the lungs 2 (two) times daily.  1 Inhaler  12  . predniSONE (DELTASONE) 10 MG tablet Take 4 for two days three for two days two for two days one for two days  20 tablet  0   No facility-administered medications prior to visit.      Review of Systems  review of systems taken in detail and is negative except as noted above per history present illness     Objective:   Physical Exam  Filed Vitals:   12/01/12 1449  BP: 142/90  Pulse: 84  Temp: 98.2 F (36.8 C)  TempSrc: Oral  Height: 5' 3.5" (1.613 m)  Weight: 176 lb 6.4 oz (80.015 kg)  SpO2: 93%    Gen: Pleasant, well-nourished, in no distress,  normal affect  ENT: No lesions,  mouth clear,  oropharynx clear, no postnasal drip  Neck: No JVD, no TMG, no carotid bruits  Lungs: No use of accessory muscles, no dullness to percussion, distant breath sounds, expired wheezes  Cardiovascular: RRR, heart sounds normal, no murmur or gallops, no peripheral edema  Abdomen: soft and NT, no HSM,  BS normal  Musculoskeletal: No deformities, no cyanosis or clubbing  Neuro: alert, non focal  Skin: Warm, no lesions or rashes        Assessment & Plan:   ASTHMA, PERSISTENT, MODERATE Moderate persistent asthma with acute hemoptysis Plan A Flu vaccine was given A CT chest will be obtained, no contrast Stay on 80 symbicort two puff twice daily Possible bronchoscopy based upon CT scan of chest result    Updated Medication List Outpatient Encounter Prescriptions as of 12/01/2012  Medication Sig Dispense Refill  . albuterol (PROVENTIL HFA;VENTOLIN HFA) 108 (90 BASE) MCG/ACT inhaler Inhale 2 puffs into the lungs every 6 (six) hours as needed. For shortness of breath.      Marland Kitchen aspirin EC 81 MG tablet Take 162 mg by mouth daily.      . B  Complex-C (B-COMPLEX WITH VITAMIN C) tablet Take 1 tablet by mouth 2 (two) times a week.      Marland Kitchen  budesonide-formoterol (SYMBICORT) 80-4.5 MCG/ACT inhaler Inhale 2 puffs into the lungs 2 (two) times daily.  1 Inhaler  11  . calcium carbonate (TUMS - DOSED IN MG ELEMENTAL CALCIUM) 500 MG chewable tablet Chew 1 tablet by mouth 3 (three) times daily as needed. For heartburn.      . cholecalciferol (VITAMIN D) 1000 UNITS tablet Take 2,000 Units by mouth daily.       . Coenzyme Q10 (CO Q10) 100 MG TABS Take 1 tablet by mouth daily.        Marland Kitchen dextromethorphan-guaiFENesin (MUCINEX DM) 30-600 MG per 12 hr tablet Take 1 tablet by mouth every 12 (twelve) hours.        . Doxylamine Succinate, Sleep, (UNISOM) 25 MG tablet Take 12.5 mg by mouth at bedtime as needed. For sleep.      . fexofenadine (ALLEGRA) 180 MG tablet Take 180 mg by mouth daily.        . Glucosamine 500 MG CAPS Take 1 capsule by mouth daily.        . Glucose Blood (FREESTYLE LITE TEST VI) Once daily      . hydrochlorothiazide (HYDRODIURIL) 12.5 MG tablet Take 12.5 mg by mouth daily.       . metFORMIN (GLUMETZA) 500 MG (MOD) 24 hr tablet Take 500 mg by mouth daily with breakfast.        . Multiple Vitamins-Minerals (MULTIVITAMIN WITH MINERALS) tablet Take 1 tablet by mouth daily.        . nebivolol (BYSTOLIC) 5 MG tablet Take 5 mg by mouth daily.        Marland Kitchen NITROSTAT 0.4 MG SL tablet as needed.      Marland Kitchen omeprazole (PRILOSEC) 20 MG capsule Take 20 mg by mouth daily as needed. For indigestion.      Marland Kitchen Respiratory Therapy Supplies (FLUTTER) DEVI Use flutter valve 4 times daily  1 each  0  . [DISCONTINUED] budesonide-formoterol (SYMBICORT) 80-4.5 MCG/ACT inhaler Inhale 2 puffs into the lungs 2 (two) times daily.      . [DISCONTINUED] azithromycin (ZITHROMAX) 250 MG tablet Take 1 tablet (250 mg total) by mouth daily. Take two once then one daily until gone  6 each  0  . [DISCONTINUED] budesonide-formoterol (SYMBICORT) 160-4.5 MCG/ACT inhaler Inhale 2  puffs into the lungs 2 (two) times daily.  1 Inhaler  12  . [DISCONTINUED] predniSONE (DELTASONE) 10 MG tablet Take 4 for two days three for two days two for two days one for two days  20 tablet  0   No facility-administered encounter medications on file as of 12/01/2012.    ASTHMA, PERSISTENT, MODERATE Moderate persistent asthma with acute hemoptysis Plan A Flu vaccine was given A CT chest will be obtained, no contrast Stay on 80 symbicort two puff twice daily Possible bronchoscopy based upon CT scan of chest result

## 2012-12-01 NOTE — Patient Instructions (Addendum)
A Flu vaccine was given A CT chest will be obtained, no contrast Stay on 80 symbicort two puff twice daily We may consider a bronchoscopy depending on the results of the CT Chest Return 4 months

## 2012-12-02 NOTE — Assessment & Plan Note (Signed)
Moderate persistent asthma with acute hemoptysis Plan A Flu vaccine was given A CT chest will be obtained, no contrast Stay on 80 symbicort two puff twice daily Possible bronchoscopy based upon CT scan of chest result

## 2012-12-07 ENCOUNTER — Telehealth: Payer: Self-pay | Admitting: Critical Care Medicine

## 2012-12-08 NOTE — Telephone Encounter (Signed)
Per Almyra Free appt was changed to [facility] for 12/09/12 @ 10:45.  Pt is aware.  Nothing further needed. Leanora Ivanoff

## 2012-12-09 ENCOUNTER — Other Ambulatory Visit: Payer: Self-pay | Admitting: *Deleted

## 2012-12-09 ENCOUNTER — Ambulatory Visit
Admission: RE | Admit: 2012-12-09 | Discharge: 2012-12-09 | Disposition: A | Discharge status: Home / Self Care | Payer: Medicare Other | Source: Ambulatory Visit | Attending: Critical Care Medicine | Admitting: Critical Care Medicine

## 2012-12-09 ENCOUNTER — Other Ambulatory Visit: Payer: Medicare Other

## 2012-12-09 DIAGNOSIS — R042 Hemoptysis: Secondary | ICD-10-CM

## 2012-12-10 ENCOUNTER — Telehealth: Payer: Self-pay | Admitting: *Deleted

## 2012-12-10 MED ORDER — LEVOFLOXACIN 500 MG PO TABS: 500.0000 mg | ORAL_TABLET | Freq: Every day | ORAL | Status: DC

## 2012-12-10 NOTE — Telephone Encounter (Signed)
Received the following results from Dr. Delford Field:  Result Note    Please call the pt and tell her I tried to call, but could not reach her. Tell her CT Scan shows NO cancer. There is a small area of inflammation in Right upper lung that likely was the source for the bleeding   See how she is doing. She needs to finish meds rx at last OV   Result Note    Actually we did not Rx any antibiotics at last OV, See if she is still coughing up blood or mucus   IF so then Rx : Levaquin 500mg  /d x 7days   -------  Called, spoke with pt.  Informed her of CT results per Dr. Delford Field.  Pt verbalized understanding of this.  Pt states she hasn't coughed up any blood recently.  Does report a prod cough with white mucus.  Advised would send in Levaquin 500 mg qd x 7 days to [facility] d/t prod cough.  Pt verbalized understanding and is to call back if anything further is needed.

## 2012-12-10 NOTE — Progress Notes (Signed)
Quick Note:  Called, spoke with pt. Informed her of CT Chest results per Dr. Delford Field. She verbalized understanding of this. Pt states she hasn't had hemoptysis recently but is still coughing up white mucus. Advised d/t prod cough, I would send rx for levaquin 500 mg qd x 7 days per PW to [facility]. Pt verbalized understanding and is to call back if anything further is needed. ______

## 2012-12-10 NOTE — Telephone Encounter (Signed)
Thank you :)

## 2012-12-14 ENCOUNTER — Other Ambulatory Visit: Payer: Self-pay | Admitting: Critical Care Medicine

## 2012-12-27 ENCOUNTER — Encounter: Payer: Self-pay | Admitting: Vascular Surgery

## 2012-12-28 ENCOUNTER — Ambulatory Visit (INDEPENDENT_AMBULATORY_CARE_PROVIDER_SITE_OTHER): Payer: Medicare Other | Admitting: Vascular Surgery

## 2012-12-28 ENCOUNTER — Ambulatory Visit (HOSPITAL_COMMUNITY)
Admission: RE | Admit: 2012-12-28 | Discharge: 2012-12-28 | Disposition: A | Discharge status: Home / Self Care | Payer: Medicare Other | Source: Ambulatory Visit | Attending: Vascular Surgery | Admitting: Vascular Surgery

## 2012-12-28 DIAGNOSIS — I6529 Occlusion and stenosis of unspecified carotid artery: Secondary | ICD-10-CM

## 2012-12-30 ENCOUNTER — Other Ambulatory Visit: Payer: Self-pay | Admitting: *Deleted

## 2012-12-31 ENCOUNTER — Encounter: Payer: Self-pay | Admitting: Vascular Surgery

## 2013-02-16 ENCOUNTER — Telehealth: Payer: Self-pay

## 2013-02-16 MED ORDER — CARVEDILOL 3.125 MG PO TABS: 3.1250 mg | ORAL_TABLET | Freq: Two times a day (BID) | ORAL | Status: DC

## 2013-02-16 NOTE — Telephone Encounter (Signed)
received rqst from pt pharmacy. pt bystolic is not being manufactured.and would like Dr.Smith to consider changing pt med.Dr.Smith will switch pt to carvedilol 3.125 bid new rx sent to pharmacy [facility].detail message left on pt vm to call office with any questions.

## 2013-03-02 ENCOUNTER — Telehealth: Payer: Self-pay | Admitting: Interventional Cardiology

## 2013-03-02 MED ORDER — NEBIVOLOL HCL 5 MG PO TABS: 5.0000 mg | ORAL_TABLET | Freq: Every day | ORAL | Status: DC

## 2013-03-02 NOTE — Telephone Encounter (Signed)
pt adv per Dr.Smith ok to take a Bystolic 5 mg today and  resume taking daily. pt is stop carvedilol.pt is to monitor bp and bpm and call the office if they are elevated.pt verbalized understanding and is agreeable with plan.

## 2013-03-02 NOTE — Telephone Encounter (Signed)
New message     bp is running high---bystolic was switched to something else because supply was down.  Pt was on another medication for about a week.  She want to know if she can go back to bystolic---took coreg about 8am this am

## 2013-04-18 ENCOUNTER — Ambulatory Visit (INDEPENDENT_AMBULATORY_CARE_PROVIDER_SITE_OTHER): Payer: Medicare Other | Admitting: Critical Care Medicine

## 2013-04-18 ENCOUNTER — Encounter: Payer: Self-pay | Admitting: Critical Care Medicine

## 2013-04-18 VITALS — BP 142/84 | HR 84 | Temp 98.5°F | Ht 65.0 in | Wt 177.4 lb

## 2013-04-18 DIAGNOSIS — J45909 Unspecified asthma, uncomplicated: Secondary | ICD-10-CM

## 2013-04-18 NOTE — Progress Notes (Signed)
Subjective:    Patient ID: Cheryl Hernandez, female    DOB: [birth year], 73 y.o.   MRN: [id number]  HPI  73 y.o.   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 4 month follow up    Breathing is unchanged.  Hemoptysis has resolved.  Does SOB, wheezing, and prod cough with white mucus.  No further hemoptysis.  Levaquin helped.  No dyspnea, produces mucus daily white , no blood.  Using flutter daily Uses symbicort 1 puff bid.   Pt denies any significant sore throat, nasal congestion or excess secretions, fever, chills, sweats, unintended weight loss, pleurtic or exertional chest pain, orthopnea PND, or leg swelling Pt denies any increase in rescue therapy over baseline, denies waking up needing it or having any early am or nocturnal exacerbations of coughing/wheezing/or dyspnea. Pt also denies any obvious fluctuation in symptoms with  weather or environmental change or other alleviating or aggravating factors    PUL ASTHMA HISTORY 04/18/2013 09/03/2012 01/16/2012 09/14/2010 09/13/2010  Symptoms 0-2 days/week Daily 0-2 days/week 0-2 days/week >2 days/week  Nighttime awakenings 0-2/month 0-2/month 0-2/month 0-2/month 0-2/month  Interference with activity Minor limitations Some limitations No limitations No limitations No limitations  SABA use 0-2 days/wk Daily 0-2 days/wk 0-2 days/wk 0-2 days/wk  Exacerbations requiring oral steroids 0-1 / year 0-1 / year 0-1 / year 0-1 / year 0-1 / year    Past Medical History  Diagnosis Date  . Asthma   . HTN (hypertension)   . Paroxysmal tachycardia   . GERD (gastroesophageal reflux disease)   . Diabetes insipidus   . Rhinitis   . Hyperlipidemia   . Arthritis   . COPD (chronic obstructive pulmonary disease)   . CAD (coronary artery disease)   . Diverticulosis   . Chronic diarrhea      Family History  Problem Relation Age of Onset  . Allergies Mother   . Prostate cancer Maternal Grandfather       History   Social History  . Marital Status: Widowed    Spouse Name: N/A    Number of Children: N/A  . Years of Education: N/A   Occupational History  . retired Therapist, sports    Social History Main Topics  . Smoking status: Former Smoker -- 1.00 packs/day for 25 years    Types: Cigarettes    Quit date: 03/31/1998  . Smokeless tobacco: Never Used  . Alcohol Use: Not on file  . Drug Use: Not on file  . Sexual Activity: Not on file   Other Topics Concern  . Not on file   Social History Narrative  . No narrative on file     Allergies  Allergen Reactions  . Cefuroxime Axetil   . Cephalexin   . Codeine     Tolerates Dilaudid.  . Doxycycline     REACTION: vomiting/ GI upset     Outpatient Prescriptions Prior to Visit  Medication Sig Dispense Refill  . albuterol (PROVENTIL HFA;VENTOLIN HFA) 108 (90 BASE) MCG/ACT inhaler Inhale 2 puffs into the lungs every 6 (six) hours as needed. For shortness of breath.      Marland Kitchen aspirin EC 81 MG tablet Take 81 mg by mouth daily.       . B Complex-C (B-COMPLEX WITH VITAMIN C) tablet Take 1 tablet by mouth 2 (two) times a week.      . calcium carbonate (TUMS - DOSED IN MG ELEMENTAL CALCIUM) 500 MG chewable tablet Chew 1 tablet  by mouth 3 (three) times daily as needed. For heartburn.      . cholecalciferol (VITAMIN D) 1000 UNITS tablet Take 2,000 Units by mouth daily.       . Coenzyme Q10 (CO Q10) 100 MG TABS Take 1 tablet by mouth daily.        Marland Kitchen dextromethorphan-guaiFENesin (MUCINEX DM) 30-600 MG per 12 hr tablet Take 1 tablet by mouth every 12 (twelve) hours.        . Doxylamine Succinate, Sleep, (UNISOM) 25 MG tablet Take 12.5 mg by mouth at bedtime as needed. For sleep.      . fexofenadine (ALLEGRA) 180 MG tablet Take 180 mg by mouth daily.        . Glucosamine 500 MG CAPS Take 1 capsule by mouth daily.        . Glucose Blood (FREESTYLE LITE TEST VI) Once daily      . hydrochlorothiazide (HYDRODIURIL) 12.5 MG tablet Take 12.5 mg by mouth daily.        . metFORMIN (GLUMETZA) 500 MG (MOD) 24 hr tablet Take 500 mg by mouth daily with breakfast.        . Multiple Vitamins-Minerals (MULTIVITAMIN WITH MINERALS) tablet Take 1 tablet by mouth daily.        . nebivolol (BYSTOLIC) 5 MG tablet Take 1 tablet (5 mg total) by mouth daily.      Marland Kitchen NITROSTAT 0.4 MG SL tablet as needed.      Marland Kitchen omeprazole (PRILOSEC) 20 MG capsule take 1 capsule by mouth once daily if needed  30 capsule  6  . budesonide-formoterol (SYMBICORT) 80-4.5 MCG/ACT inhaler Inhale 2 puffs into the lungs 2 (two) times daily.  1 Inhaler  11  . omeprazole (PRILOSEC) 20 MG capsule Take 20 mg by mouth daily as needed. For indigestion.      Marland Kitchen Respiratory Therapy Supplies (FLUTTER) DEVI Use flutter valve 4 times daily  1 each  0  . levofloxacin (LEVAQUIN) 500 MG tablet Take 1 tablet (500 mg total) by mouth daily.  7 tablet  0   No facility-administered medications prior to visit.      Review of Systems  review of systems taken in detail and is negative except as noted above per history present illness     Objective:   Physical Exam  Filed Vitals:   04/18/13 1116  BP: 142/84  Pulse: 84  Temp: 98.5 F (36.9 C)  TempSrc: Oral  Height: 5\' 5"  (1.651 m)  Weight: 177 lb 6.4 oz (80.468 kg)  SpO2: 98%    Gen: Pleasant, well-nourished, in no distress,  normal affect  ENT: No lesions,  mouth clear,  oropharynx clear, no postnasal drip  Neck: No JVD, no TMG, no carotid bruits  Lungs: No use of accessory muscles, no dullness to percussion, distant breath sounds, no wheezes  Cardiovascular: RRR, heart sounds normal, no murmur or gallops, no peripheral edema  Abdomen: soft and NT, no HSM,  BS normal  Musculoskeletal: No deformities, no cyanosis or clubbing  Neuro: alert, non focal  Skin: Warm, no lesions or rashes        Assessment & Plan:   ASTHMA, PERSISTENT, MODERATE Moderate persistent asthma stable at this time History right upper lobe inflammatory nodule now  resolved Plan Maintain Symbicort twice daily 80 mcg strength     Updated Medication List Outpatient Encounter Prescriptions as of 04/18/2013  Medication Sig  . albuterol (PROVENTIL HFA;VENTOLIN HFA) 108 (90 BASE) MCG/ACT inhaler Inhale 2 puffs into the  lungs every 6 (six) hours as needed. For shortness of breath.  Marland Kitchen aspirin EC 81 MG tablet Take 81 mg by mouth daily.   . B Complex-C (B-COMPLEX WITH VITAMIN C) tablet Take 1 tablet by mouth 2 (two) times a week.  . budesonide-formoterol (SYMBICORT) 80-4.5 MCG/ACT inhaler Inhale 1 puff into the lungs 2 (two) times daily.  . calcium carbonate (TUMS - DOSED IN MG ELEMENTAL CALCIUM) 500 MG chewable tablet Chew 1 tablet by mouth 3 (three) times daily as needed. For heartburn.  . cholecalciferol (VITAMIN D) 1000 UNITS tablet Take 2,000 Units by mouth daily.   . Coenzyme Q10 (CO Q10) 100 MG TABS Take 1 tablet by mouth daily.    Marland Kitchen dextromethorphan-guaiFENesin (MUCINEX DM) 30-600 MG per 12 hr tablet Take 1 tablet by mouth every 12 (twelve) hours.    . Doxylamine Succinate, Sleep, (UNISOM) 25 MG tablet Take 12.5 mg by mouth at bedtime as needed. For sleep.  . fexofenadine (ALLEGRA) 180 MG tablet Take 180 mg by mouth daily.    . Glucosamine 500 MG CAPS Take 1 capsule by mouth daily.    . Glucose Blood (FREESTYLE LITE TEST VI) Once daily  . hydrochlorothiazide (HYDRODIURIL) 12.5 MG tablet Take 12.5 mg by mouth daily.   . metFORMIN (GLUMETZA) 500 MG (MOD) 24 hr tablet Take 500 mg by mouth daily with breakfast.    . Multiple Vitamins-Minerals (MULTIVITAMIN WITH MINERALS) tablet Take 1 tablet by mouth daily.    . nebivolol (BYSTOLIC) 5 MG tablet Take 1 tablet (5 mg total) by mouth daily.  Marland Kitchen NITROSTAT 0.4 MG SL tablet as needed.  Marland Kitchen omeprazole (PRILOSEC) 20 MG capsule take 1 capsule by mouth once daily if needed  . Respiratory Therapy Supplies (FLUTTER) DEVI Use flutter valve 1 times daily  . [DISCONTINUED] budesonide-formoterol (SYMBICORT) 80-4.5 MCG/ACT  inhaler Inhale 2 puffs into the lungs 2 (two) times daily.  . [DISCONTINUED] omeprazole (PRILOSEC) 20 MG capsule Take 20 mg by mouth daily as needed. For indigestion.  . [DISCONTINUED] Respiratory Therapy Supplies (FLUTTER) DEVI Use flutter valve 4 times daily  . [DISCONTINUED] levofloxacin (LEVAQUIN) 500 MG tablet Take 1 tablet (500 mg total) by mouth daily.    ASTHMA, PERSISTENT, MODERATE Moderate persistent asthma stable at this time History right upper lobe inflammatory nodule now resolved Plan Maintain Symbicort twice daily 80 mcg strength

## 2013-04-18 NOTE — Patient Instructions (Signed)
No change in medications. Return in         4 months 

## 2013-04-18 NOTE — Assessment & Plan Note (Signed)
Moderate persistent asthma stable at this time History right upper lobe inflammatory nodule now resolved Plan Maintain Symbicort twice daily 80 mcg strength

## 2013-07-18 ENCOUNTER — Other Ambulatory Visit: Payer: Self-pay

## 2013-09-09 ENCOUNTER — Encounter (INDEPENDENT_AMBULATORY_CARE_PROVIDER_SITE_OTHER): Payer: Self-pay

## 2013-09-09 ENCOUNTER — Ambulatory Visit (INDEPENDENT_AMBULATORY_CARE_PROVIDER_SITE_OTHER): Payer: Medicare Other | Admitting: Critical Care Medicine

## 2013-09-09 ENCOUNTER — Encounter: Payer: Self-pay | Admitting: Critical Care Medicine

## 2013-09-09 VITALS — BP 122/66 | HR 75 | Ht 65.0 in | Wt 174.0 lb

## 2013-09-09 DIAGNOSIS — J45909 Unspecified asthma, uncomplicated: Secondary | ICD-10-CM

## 2013-09-09 DIAGNOSIS — Z23 Encounter for immunization: Secondary | ICD-10-CM

## 2013-09-09 MED ORDER — BUDESONIDE-FORMOTEROL FUMARATE 80-4.5 MCG/ACT IN AERO: 1.0000 | INHALATION_SPRAY | Freq: Two times a day (BID) | RESPIRATORY_TRACT | Status: DC

## 2013-09-09 NOTE — Progress Notes (Signed)
Subjective:    Patient ID: Cheryl Hernandez, female    DOB: 1940-04-16, 73 y.o.   MRN: [id number]  HPI  73 y.o.   Female with moderate persistent asthma   09/09/2013 Chief Complaint  Patient presents with  . Follow-up    Pt states she's had an easy spring.  C/o sometimes prod cough with clear/white muocus.   No new flares this spring.  No new issues    PUL ASTHMA HISTORY 09/09/2013 09/09/2013 04/18/2013 09/03/2012 01/16/2012  Symptoms - - 0-2 days/week Daily 0-2 days/week  Nighttime awakenings - 0-2/month 0-2/month 0-2/month 0-2/month  Interference with activity - - Minor limitations Some limitations No limitations  SABA use - 0-2 days/wk 0-2 days/wk Daily 0-2 days/wk  Exacerbations requiring oral steroids 0-1 / year 0-1 / year 0-1 / year 0-1 / year 0-1 / year       Review of Systems  review of systems taken in detail and is negative except as noted above per history present illness     Objective:   Physical Exam  Filed Vitals:   09/09/13 1051  BP: 122/66  Pulse: 75  Height: 5\' 5"  (1.651 m)  Weight: 174 lb (78.926 kg)  SpO2: 97%    Gen: Pleasant, well-nourished, in no distress,  normal affect  ENT: No lesions,  mouth clear,  oropharynx clear, no postnasal drip  Neck: No JVD, no TMG, no carotid bruits  Lungs: No use of accessory muscles, no dullness to percussion, distant breath sounds, no wheezes  Cardiovascular: RRR, heart sounds normal, no murmur or gallops, no peripheral edema  Abdomen: soft and NT, no HSM,  BS normal  Musculoskeletal: No deformities, no cyanosis or clubbing  Neuro: alert, non focal  Skin: Warm, no lesions or rashes        Assessment & Plan:   ASTHMA, PERSISTENT, MODERATE Moderate persistent asthma stable on 80 mcg Symbicort Plan Maintains Symbicort 80 mcg one puff twice daily Administer Prevnar 13 vaccine    Updated Medication List Outpatient Encounter Prescriptions as of 09/09/2013  Medication Sig  . albuterol (PROVENTIL  HFA;VENTOLIN HFA) 108 (90 BASE) MCG/ACT inhaler Inhale 2 puffs into the lungs every 6 (six) hours as needed. For shortness of breath.  Marland Kitchen aspirin EC 81 MG tablet Take 81 mg by mouth daily.   . B Complex-C (B-COMPLEX WITH VITAMIN C) tablet Take 1 tablet by mouth 2 (two) times a week.  . budesonide-formoterol (SYMBICORT) 80-4.5 MCG/ACT inhaler Inhale 1 puff into the lungs 2 (two) times daily.  . calcium carbonate (TUMS - DOSED IN MG ELEMENTAL CALCIUM) 500 MG chewable tablet Chew 1 tablet by mouth 3 (three) times daily as needed. For heartburn.  . cholecalciferol (VITAMIN D) 1000 UNITS tablet Take 2,000 Units by mouth daily.   . Coenzyme Q10 (CO Q10) 100 MG TABS Take 1 tablet by mouth daily.    Marland Kitchen dextromethorphan-guaiFENesin (MUCINEX DM) 30-600 MG per 12 hr tablet Take 1 tablet by mouth every 12 (twelve) hours.    . Doxylamine Succinate, Sleep, (UNISOM) 25 MG tablet Take 12.5 mg by mouth at bedtime as needed. For sleep.  . fexofenadine (ALLEGRA) 180 MG tablet Take 180 mg by mouth daily.    . Glucosamine 500 MG CAPS Take 1 capsule by mouth daily.    . Glucose Blood (FREESTYLE LITE TEST VI) Once daily  . hydrochlorothiazide (HYDRODIURIL) 12.5 MG tablet Take 12.5 mg by mouth daily.   . metFORMIN (GLUMETZA) 500 MG (MOD) 24 hr tablet Take 500 mg by  mouth daily with breakfast.    . Multiple Vitamins-Minerals (MULTIVITAMIN WITH MINERALS) tablet Take 1 tablet by mouth daily.    . nebivolol (BYSTOLIC) 5 MG tablet Take 1 tablet (5 mg total) by mouth daily.  Marland Kitchen NITROSTAT 0.4 MG SL tablet as needed.  Marland Kitchen omeprazole (PRILOSEC) 20 MG capsule take 1 capsule by mouth once daily if needed  . Respiratory Therapy Supplies (FLUTTER) DEVI Use flutter valve 1 times daily  . [DISCONTINUED] budesonide-formoterol (SYMBICORT) 80-4.5 MCG/ACT inhaler Inhale 1 puff into the lungs 2 (two) times daily.    ASTHMA, PERSISTENT, MODERATE Moderate persistent asthma stable on 80 mcg Symbicort Plan Maintains Symbicort 80 mcg one puff  twice daily Administer Prevnar 13 vaccine

## 2013-09-09 NOTE — Assessment & Plan Note (Signed)
Moderate persistent asthma stable on 80 mcg Symbicort Plan Maintains Symbicort 80 mcg one puff twice daily Administer Prevnar 13 vaccine

## 2013-09-09 NOTE — Patient Instructions (Signed)
Prevnar 13 today was given Stay symbicort 80 one puff twice daily Return 4 months

## 2013-09-13 ENCOUNTER — Telehealth: Payer: Self-pay | Admitting: Interventional Cardiology

## 2013-09-13 NOTE — Telephone Encounter (Signed)
New message  Patient needs surgical clearance. Please call and advise. Plastic surg.

## 2013-09-13 NOTE — Telephone Encounter (Signed)
Spoke with patient who states she is considering plastic surgery and was told by surgeon that she will have to be cleared by Dr. [name].  Patient states she last saw Dr. [name] last August (records are not in epic) and she currently has an appointment for August 4 but would like to be seen sooner.  I advised patient that August is the earliest opening that I can book.  I advised that I will send message to Rush Farmer, CMA, Dr. Thompson Caul primary CMA who is not in the office today but will return tomorrow who can look for sooner appointment.  Patient verbalized understanding and agreement.

## 2013-09-14 NOTE — Telephone Encounter (Signed)
pt called back. appt made with Dr.Smith for 7/15 @9am .pt aware and thanked me for the call back

## 2013-09-14 NOTE — Telephone Encounter (Signed)
called to offer pt an eralier appt with Dr.Smith lmtcb

## 2013-10-11 ENCOUNTER — Ambulatory Visit (INDEPENDENT_AMBULATORY_CARE_PROVIDER_SITE_OTHER): Payer: Medicare Other | Admitting: Interventional Cardiology

## 2013-10-11 ENCOUNTER — Encounter: Payer: Self-pay | Admitting: Interventional Cardiology

## 2013-10-11 ENCOUNTER — Telehealth: Payer: Self-pay | Admitting: Interventional Cardiology

## 2013-10-11 VITALS — BP 128/78 | HR 84 | Ht 64.0 in | Wt 174.0 lb

## 2013-10-11 DIAGNOSIS — J45909 Unspecified asthma, uncomplicated: Secondary | ICD-10-CM

## 2013-10-11 DIAGNOSIS — E119 Type 2 diabetes mellitus without complications: Secondary | ICD-10-CM | POA: Insufficient documentation

## 2013-10-11 DIAGNOSIS — I10 Essential (primary) hypertension: Secondary | ICD-10-CM

## 2013-10-11 DIAGNOSIS — I2510 Atherosclerotic heart disease of native coronary artery without angina pectoris: Secondary | ICD-10-CM | POA: Insufficient documentation

## 2013-10-11 DIAGNOSIS — I479 Paroxysmal tachycardia, unspecified: Secondary | ICD-10-CM

## 2013-10-11 DIAGNOSIS — Z01810 Encounter for preprocedural cardiovascular examination: Secondary | ICD-10-CM

## 2013-10-11 NOTE — Telephone Encounter (Signed)
New message     Pt is scheduled for a face lift--she saw Dr [name] this am but did not tell her if she can stop her aspirin 17days prior to surgery.  She is there in their office now to schedule the surgery but they need to hear from Dr [name] first.  I told them he was not in the office this pm.

## 2013-10-11 NOTE — Progress Notes (Signed)
Patient ID: Cheryl Hernandez, female   DOB: [birth year], 73 y.o.   MRN: [id number]    [address] [place], [place]  [address] Phone: [phone] Fax:  [phone]  Date:  10/11/2013   ID:  Cheryl Hernandez, DOB 05-25-1940, MRN [id number]  PCP:  Tivis Ringer, MD   ASSESSMENT:  1. Coronary artery disease, asymptomatic with reference to angina and ischemic complaints. 2. Preoperative cardiovascular evaluation, prior to cosmetic surgery, eye and chin lift 3. Hypertension 4. History of ventricular tachycardia, no recent recurrences 5. Hyperlipidemia 6. Diabetes mellitus, type II 7. Asthma   PLAN:  1. The patient is doing well from a cardiac standpoint being asymptomatic. She is clear than felt to be at low risk for eye and facial cosmetic surgery if deemed necessary. Expected risk of cardiovascular complications are anticipated to be less than 1% 2. Continue an active lifestyle 3. No change in current medical regimen 4. Clinical followup in one year   SUBJECTIVE: Cheryl Hernandez is a 73 y.o. female who is doing well. She denies angina. She has not had orthopnea, PND, or edema. She does have increasing fatigue as she ages. She is not sleeping as well as she has in the past. She has not had palpitations or syncope. She has not needed to use sublingual nitroglycerin.   Wt Readings from Last 3 Encounters:  10/11/13 174 lb (78.926 kg)  09/09/13 174 lb (78.926 kg)  04/18/13 177 lb 6.4 oz (80.468 kg)     Past Medical History  Diagnosis Date  . Asthma   . HTN (hypertension)   . Paroxysmal tachycardia   . GERD (gastroesophageal reflux disease)   . Diabetes insipidus   . Rhinitis   . Hyperlipidemia   . Arthritis   . COPD (chronic obstructive pulmonary disease)   . CAD (coronary artery disease)   . Diverticulosis   . Chronic diarrhea     Current Outpatient Prescriptions  Medication Sig Dispense Refill  . albuterol (PROVENTIL HFA;VENTOLIN HFA) 108 (90  BASE) MCG/ACT inhaler Inhale 2 puffs into the lungs every 6 (six) hours as needed. For shortness of breath.      Marland Kitchen aspirin EC 81 MG tablet Take 162 mg by mouth daily.       . B Complex-C (B-COMPLEX WITH VITAMIN C) tablet Take 1 tablet by mouth 2 (two) times a week.      . budesonide-formoterol (SYMBICORT) 80-4.5 MCG/ACT inhaler Inhale 1 puff into the lungs 2 (two) times daily.  1 Inhaler  11  . calcium carbonate (TUMS - DOSED IN MG ELEMENTAL CALCIUM) 500 MG chewable tablet Chew 1 tablet by mouth 3 (three) times daily as needed. For heartburn.      . cholecalciferol (VITAMIN D) 1000 UNITS tablet Take 2,000 Units by mouth daily.       . Coenzyme Q10 (CO Q10) 100 MG TABS Take 1 tablet by mouth daily.        Marland Kitchen dextromethorphan-guaiFENesin (MUCINEX DM) 30-600 MG per 12 hr tablet Take 1 tablet by mouth every 12 (twelve) hours.        . Doxylamine Succinate, Sleep, (UNISOM) 25 MG tablet Take 12.5 mg by mouth at bedtime as needed. For sleep.      . fexofenadine (ALLEGRA) 180 MG tablet Take 180 mg by mouth daily.        . Glucosamine 500 MG CAPS Take 1 capsule by mouth daily.        . Glucose Blood (FREESTYLE  LITE TEST VI) Once daily      . hydrochlorothiazide (HYDRODIURIL) 12.5 MG tablet Take 12.5 mg by mouth daily.       . metFORMIN (GLUMETZA) 500 MG (MOD) 24 hr tablet Take 500 mg by mouth daily with breakfast.        . Multiple Vitamins-Minerals (MULTIVITAMIN WITH MINERALS) tablet Take 1 tablet by mouth daily.        . nebivolol (BYSTOLIC) 5 MG tablet Take 1 tablet (5 mg total) by mouth daily.      Marland Kitchen NITROSTAT 0.4 MG SL tablet as needed.      Marland Kitchen omeprazole (PRILOSEC) 20 MG capsule take 1 capsule by mouth once daily if needed  30 capsule  6  . Respiratory Therapy Supplies (FLUTTER) DEVI Use flutter valve 1 times daily       No current facility-administered medications for this visit.    Allergies:    Allergies  Allergen Reactions  . Cefuroxime Axetil   . Cephalexin   . Codeine     Tolerates  Dilaudid.  . Doxycycline     REACTION: vomiting/ GI upset    Social History:  The patient  reports that she quit smoking about 15 years ago. Her smoking use included Cigarettes. She has a 25 pack-year smoking history. She has never used smokeless tobacco.   ROS:  Please see the history of present illness.   Denies blood in urine or stool. No neurological or peripheral vascular complaints. No medication side effects. Weight and appetite have been stable.   All other systems reviewed and negative.   OBJECTIVE: VS:  BP 128/78  Pulse 84  Ht 5\' 4"  (1.626 m)  Wt 174 lb (78.926 kg)  BMI 29.85 kg/m2 Well nourished, well developed, in no acute distress, younger than stated age 87: normal Neck: JVD flat. Carotid bruit absent  Cardiac:  normal S1, S2; RRR; no murmur Lungs:  clear to auscultation bilaterally, no wheezing, rhonchi or rales Abd: soft, nontender, no hepatomegaly Ext: Edema absent. Pulses 2+ Skin: warm and dry Neuro:  CNs 2-12 intact, no focal abnormalities noted  EKG:  Normal       Signed, Illene Labrador III, MD 10/11/2013 9:21 AM

## 2013-10-11 NOTE — Telephone Encounter (Signed)
Cheryl Hernandez at [facility] plastic surgery called regarding surgical clearance.  Pt was seen by Dr. [name] this AM. Plastic surgery MD would like for Dr [name] to okay for pt to hold Asprin for 17 days prior plastic surgery. The  surgery's  office is aware that this message would be send to Dr. [name] for recommendations.

## 2013-10-11 NOTE — Patient Instructions (Signed)
Your physician discussed the importance of regular exercise and recommended that you start or continue a regular exercise program for good health.  Your physician wants you to follow-up in: 1 year with Dr. [name]. You will receive a reminder letter in the mail two months in advance. If you don't receive a letter, please call our office to schedule the follow-up appointment.

## 2013-10-14 NOTE — Telephone Encounter (Signed)
She is CLEARED for the upcoming surgery and okay to stop aspirin as requested. Fax my last note to the surgeon.

## 2013-10-17 NOTE — Telephone Encounter (Signed)
Surgical clearance note , and last office visit faxed to [facility] to fax # [phone] att: Tomma Lightning.

## 2013-10-26 ENCOUNTER — Other Ambulatory Visit: Payer: Self-pay | Admitting: Interventional Cardiology

## 2013-10-28 ENCOUNTER — Other Ambulatory Visit: Payer: Self-pay | Admitting: Critical Care Medicine

## 2014-01-03 ENCOUNTER — Other Ambulatory Visit (HOSPITAL_COMMUNITY): Payer: Medicare Other

## 2014-01-03 ENCOUNTER — Ambulatory Visit: Payer: Medicare Other | Admitting: Vascular Surgery

## 2014-01-17 ENCOUNTER — Other Ambulatory Visit (HOSPITAL_COMMUNITY): Payer: Medicare Other

## 2014-01-17 ENCOUNTER — Ambulatory Visit: Payer: Medicare Other | Admitting: Vascular Surgery

## 2014-01-18 ENCOUNTER — Ambulatory Visit: Payer: Medicare Other | Admitting: Adult Health

## 2014-01-18 ENCOUNTER — Ambulatory Visit: Payer: Medicare Other | Admitting: Critical Care Medicine

## 2014-01-31 ENCOUNTER — Ambulatory Visit: Payer: Medicare Other | Admitting: Vascular Surgery

## 2014-01-31 ENCOUNTER — Other Ambulatory Visit (HOSPITAL_COMMUNITY): Payer: Medicare Other

## 2014-02-15 ENCOUNTER — Encounter: Payer: Self-pay | Admitting: Interventional Cardiology

## 2014-02-15 DIAGNOSIS — I6523 Occlusion and stenosis of bilateral carotid arteries: Secondary | ICD-10-CM | POA: Insufficient documentation

## 2014-05-29 ENCOUNTER — Ambulatory Visit: Payer: Self-pay | Admitting: Adult Health

## 2014-06-21 ENCOUNTER — Ambulatory Visit (INDEPENDENT_AMBULATORY_CARE_PROVIDER_SITE_OTHER): Payer: Medicare Other | Admitting: Critical Care Medicine

## 2014-06-21 ENCOUNTER — Encounter: Payer: Self-pay | Admitting: Critical Care Medicine

## 2014-06-21 VITALS — BP 156/84 | HR 78 | Temp 98.7°F | Ht 63.0 in | Wt 173.0 lb

## 2014-06-21 DIAGNOSIS — J4540 Moderate persistent asthma, uncomplicated: Secondary | ICD-10-CM

## 2014-06-21 MED ORDER — AZITHROMYCIN 250 MG PO TABS: ORAL_TABLET | ORAL | Status: DC

## 2014-06-21 MED ORDER — MOMETASONE FUROATE 50 MCG/ACT NA SUSP: 2.0000 | Freq: Every day | NASAL | Status: DC

## 2014-06-21 NOTE — Patient Instructions (Signed)
Azithromycin 250mg  Take two once then one daily until gone , sent for your trip, call us if you use this on your return Nasonex two puff ea nostril daily, use sample Stay on symbicort 160 two puff twice daily No systemic steroids Return 6 months

## 2014-06-21 NOTE — Progress Notes (Signed)
Subjective:    Patient ID: Cheryl Hernandez, female    DOB: [birth year], 74 y.o.   MRN: [id number]  HPI 74 y.o.   Female with moderate persistent asthma   06/21/2014 Chief Complaint  Patient presents with  . 9 month follow up    Increased congestion, burning in chest, wheezing, and cough with white to yellow mucus x 2 months.  DOE unchanged. No f/c/s.  Pt on 160 symbicort: will range 1-2 puff bid.  Pt notes burning in chest, Notes white yellow.  Pt notes pndrip, now worse.  Dyspnea is at baseline   PUL ASTHMA HISTORY 06/21/2014 09/09/2013 09/09/2013 04/18/2013 09/03/2012 01/16/2012 09/14/2010  Symptoms Daily - - 0-2 days/week Daily 0-2 days/week 0-2 days/week  Nighttime awakenings 0-2/month - 0-2/month 0-2/month 0-2/month 0-2/month 0-2/month  Interference with activity Minor limitations - - Minor limitations Some limitations No limitations No limitations  SABA use 0-2 days/wk - 0-2 days/wk 0-2 days/wk Daily 0-2 days/wk 0-2 days/wk  Exacerbations requiring oral steroids 0-1 / year 0-1 / year 0-1 / year 0-1 / year 0-1 / year 0-1 / year 0-1 / year       Review of Systems review of systems taken in detail and is negative except as noted above per history present illness ++heartburn if off diet.  Notes some wheezing.       Objective:   Physical Exam Filed Vitals:   06/21/14 0926  BP: 156/84  Pulse: 78  Temp: 98.7 F (37.1 C)  Height: 5\' 3"  (1.6 m)  Weight: 173 lb (78.472 kg)  SpO2: 95%    Gen: Pleasant, well-nourished, in no distress,  normal affect  ENT: No lesions,  mouth clear,  oropharynx clear,+++ postnasal drip, mild rhinitis  Neck: No JVD, no TMG, no carotid bruits  Lungs: No use of accessory muscles, no dullness to percussion, distant breath sounds, no wheezes  Cardiovascular: RRR, heart sounds normal, no murmur or gallops, no peripheral edema  Abdomen: soft and NT, no HSM,  BS normal  Musculoskeletal: No deformities, no cyanosis or clubbing  Neuro: alert, non  focal  Skin: Warm, no lesions or rashes        Assessment & Plan:   Asthma, moderate persistent Moderate persistent asthma with mild allergic rhinitis flare  Plan Nasonex two puff ea nostril daily, use sample Stay on symbicort 160 two puff twice daily No systemic steroids Return 6 months       Updated Medication List Outpatient Encounter Prescriptions as of 06/21/2014  Medication Sig  . ALPRAZolam (XANAX) 0.5 MG tablet Take 1 tablet by mouth as needed.  Marland Kitchen aspirin EC 81 MG tablet Take 162 mg by mouth daily.   . B Complex-C (B-COMPLEX WITH VITAMIN C) tablet Take 1 tablet by mouth 2 (two) times a week.  . budesonide-formoterol (SYMBICORT) 160-4.5 MCG/ACT inhaler Inhale 1-2 puffs into the lungs 2 (two) times daily.  Marland Kitchen BYSTOLIC 10 MG tablet take 1 tablet by mouth once daily (Patient taking differently: take 1/2 tablet by mouth once daily)  . calcium carbonate (TUMS - DOSED IN MG ELEMENTAL CALCIUM) 500 MG chewable tablet Chew 1 tablet by mouth 3 (three) times daily as needed. For heartburn.  . cholecalciferol (VITAMIN D) 1000 UNITS tablet Take 2,000 Units by mouth daily.   . Coenzyme Q10 (CO Q10) 100 MG TABS Take 1 tablet by mouth daily.    Marland Kitchen dextromethorphan-guaiFENesin (MUCINEX DM) 30-600 MG per 12 hr tablet Take 1 tablet by mouth every 12 (twelve) hours.    Marland Kitchen  Doxylamine Succinate, Sleep, (UNISOM) 25 MG tablet Take 12.5 mg by mouth at bedtime as needed. For sleep.  . fexofenadine (ALLEGRA) 180 MG tablet Take 180 mg by mouth daily.    . Glucosamine 500 MG CAPS Take 1 capsule by mouth daily.    . Glucose Blood (FREESTYLE LITE TEST VI) Once daily  . hydrochlorothiazide (HYDRODIURIL) 12.5 MG tablet Take 12.5 mg by mouth daily.   . metFORMIN (GLUMETZA) 500 MG (MOD) 24 hr tablet Take 500 mg by mouth daily with breakfast.    . Multiple Vitamins-Minerals (MULTIVITAMIN WITH MINERALS) tablet Take 1 tablet by mouth daily.    Marland Kitchen NITROSTAT 0.4 MG SL tablet as needed.  Marland Kitchen omeprazole (PRILOSEC)  20 MG capsule take 1 capsule by mouth once daily if needed  . PROAIR HFA 108 (90 BASE) MCG/ACT inhaler inhale 2 puffs by mouth every 4 to 6 hours if needed for wheezing  AND/OR SHORTNESS OF BREATH  . Respiratory Therapy Supplies (FLUTTER) DEVI Use flutter valve 1 times daily as needed  . [DISCONTINUED] albuterol (PROVENTIL HFA;VENTOLIN HFA) 108 (90 BASE) MCG/ACT inhaler Inhale 2 puffs into the lungs every 6 (six) hours as needed. For shortness of breath.  . [DISCONTINUED] nebivolol (BYSTOLIC) 5 MG tablet Take 1 tablet (5 mg total) by mouth daily.  Marland Kitchen azithromycin (ZITHROMAX) 250 MG tablet Take two once then one daily until gone  . budesonide-formoterol (SYMBICORT) 80-4.5 MCG/ACT inhaler Inhale 1 puff into the lungs 2 (two) times daily. (Patient not taking: Reported on 06/21/2014)  . mometasone (NASONEX) 50 MCG/ACT nasal spray Place 2 sprays into the nose daily.    Asthma, moderate persistent Moderate persistent asthma with mild allergic rhinitis flare  Plan Nasonex two puff ea nostril daily, use sample Stay on symbicort 160 two puff twice daily No systemic steroids Return 6 months

## 2014-06-22 NOTE — Assessment & Plan Note (Signed)
Moderate persistent asthma with mild allergic rhinitis flare  Plan Nasonex two puff ea nostril daily, use sample Stay on symbicort 160 two puff twice daily No systemic steroids Return 6 months

## 2014-06-26 ENCOUNTER — Telehealth: Payer: Self-pay | Admitting: Critical Care Medicine

## 2014-06-26 MED ORDER — AZITHROMYCIN 250 MG PO TABS: ORAL_TABLET | ORAL | Status: DC

## 2014-06-26 NOTE — Telephone Encounter (Signed)
Ok to refill z pak

## 2014-06-26 NOTE — Telephone Encounter (Signed)
Spoke with pt and notified z pack was refilled  Nothing further needed

## 2014-06-26 NOTE — Telephone Encounter (Signed)
Spoke with the pt  She states PW gave her rx for zpack to take with her on her trip on 3/23 She got sick over the w/e and had to get started on it on 06/24/14- was having sore throat, fever, chills, and cough with green sputum  Today, she is feeling much better, although she still has some chest congestion  She states that she is needing another rx to take with her on vacation just in case  PW, please advise thanks!

## 2014-08-11 DIAGNOSIS — F39 Unspecified mood [affective] disorder: Secondary | ICD-10-CM | POA: Insufficient documentation

## 2014-09-05 ENCOUNTER — Other Ambulatory Visit: Payer: Self-pay | Admitting: *Deleted

## 2014-09-05 DIAGNOSIS — I6523 Occlusion and stenosis of bilateral carotid arteries: Secondary | ICD-10-CM

## 2014-09-07 ENCOUNTER — Encounter (HOSPITAL_COMMUNITY): Payer: Medicare Other

## 2014-09-08 ENCOUNTER — Ambulatory Visit (HOSPITAL_COMMUNITY)
Admission: RE | Admit: 2014-09-08 | Discharge: 2014-09-08 | Disposition: A | Discharge status: Home / Self Care | Payer: Medicare Other | Source: Ambulatory Visit | Attending: Vascular Surgery | Admitting: Vascular Surgery

## 2014-09-08 ENCOUNTER — Encounter: Payer: Self-pay | Admitting: Vascular Surgery

## 2014-09-08 DIAGNOSIS — I6523 Occlusion and stenosis of bilateral carotid arteries: Secondary | ICD-10-CM | POA: Insufficient documentation

## 2014-09-12 ENCOUNTER — Ambulatory Visit (INDEPENDENT_AMBULATORY_CARE_PROVIDER_SITE_OTHER): Payer: Medicare Other | Admitting: Vascular Surgery

## 2014-09-12 ENCOUNTER — Encounter: Payer: Self-pay | Admitting: Vascular Surgery

## 2014-09-12 VITALS — BP 125/61 | HR 84 | Ht 63.0 in | Wt 170.0 lb

## 2014-09-12 DIAGNOSIS — I6522 Occlusion and stenosis of left carotid artery: Secondary | ICD-10-CM

## 2014-09-12 NOTE — Progress Notes (Signed)
Patient name: Cheryl Hernandez MRN: [id number] DOB: 24-Jul-1940 Sex: female   Referred by: Avva  Reason for referral:  Chief Complaint  Patient presents with  . Re-evaluation    1 year f/u - carotid    HISTORY OF PRESENT ILLNESS: Patient presents today for discussion of asymptomatic carotid disease. She had the duplex approximately one half years ago showing mild to moderate left carotid stenosis and no significant right carotid stenosis. She specifically denies any history of amaurosis fugax, transient ischemic attack or stroke. She is diabetic. Does not have any history of lower from the arterial insufficiency. She does report that she feels that she may have arterial insufficiency to her lower extremities due to numbness in her feet. I does have diagnosis of neuropathy as well. Does have a history of coronary artery  Past Medical History  Diagnosis Date  . Asthma   . HTN (hypertension)   . Paroxysmal tachycardia   . GERD (gastroesophageal reflux disease)   . Diabetes insipidus   . Rhinitis   . Hyperlipidemia   . Arthritis   . COPD (chronic obstructive pulmonary disease)   . CAD (coronary artery disease)   . Diverticulosis   . Chronic diarrhea   . Myocardial infarction   . Diabetes mellitus without complication     Past Surgical History  Procedure Laterality Date  . Total abdominal hysterectomy    . Tonsillectomy    . Angioplasty    . Total hip arthroplasty    . Coronary angioplasty with stent placement      History   Social History  . Marital Status: Widowed    Spouse Name: N/A  . Number of Children: N/A  . Years of Education: N/A   Occupational History  . retired Therapist, sports    Social History Main Topics  . Smoking status: Former Smoker -- 1.00 packs/day for 25 years    Types: Cigarettes    Quit date: 03/31/1998  . Smokeless tobacco: Never Used  . Alcohol Use: Not on file  . Drug Use: Not on file  . Sexual Activity: Not on file   Other Topics Concern  .  Not on file   Social History Narrative    Family History  Problem Relation Age of Onset  . Allergies Mother   . Hyperlipidemia Mother   . Prostate cancer Maternal Grandfather     Allergies as of 09/12/2014 - Review Complete 09/12/2014  Allergen Reaction Noted  . Cefuroxime axetil    . Cephalexin    . Codeine  01/02/2012  . Doxycycline  06/17/2010    Current Outpatient Prescriptions on File Prior to Visit  Medication Sig Dispense Refill  . ALPRAZolam (XANAX) 0.5 MG tablet Take 1 tablet by mouth as needed.  0  . aspirin EC 81 MG tablet Take 162 mg by mouth daily.     Marland Kitchen azithromycin (ZITHROMAX) 250 MG tablet Take two once then one daily until gone 6 tablet 0  . B Complex-C (B-COMPLEX WITH VITAMIN C) tablet Take 1 tablet by mouth 2 (two) times a week.    . budesonide-formoterol (SYMBICORT) 160-4.5 MCG/ACT inhaler Inhale 1-2 puffs into the lungs 2 (two) times daily.    . budesonide-formoterol (SYMBICORT) 80-4.5 MCG/ACT inhaler Inhale 1 puff into the lungs 2 (two) times daily. 1 Inhaler 11  . BYSTOLIC 10 MG tablet take 1 tablet by mouth once daily (Patient taking differently: take 1/2 tablet by mouth once daily) 90 tablet 3  . calcium carbonate (TUMS -  DOSED IN MG ELEMENTAL CALCIUM) 500 MG chewable tablet Chew 1 tablet by mouth 3 (three) times daily as needed. For heartburn.    . cholecalciferol (VITAMIN D) 1000 UNITS tablet Take 2,000 Units by mouth daily.     . Coenzyme Q10 (CO Q10) 100 MG TABS Take 1 tablet by mouth daily.      Marland Kitchen dextromethorphan-guaiFENesin (MUCINEX DM) 30-600 MG per 12 hr tablet Take 1 tablet by mouth every 12 (twelve) hours.      . Doxylamine Succinate, Sleep, (UNISOM) 25 MG tablet Take 12.5 mg by mouth at bedtime as needed. For sleep.    . fexofenadine (ALLEGRA) 180 MG tablet Take 180 mg by mouth daily.      . Glucosamine 500 MG CAPS Take 1 capsule by mouth daily.      . Glucose Blood (FREESTYLE LITE TEST VI) Once daily    . hydrochlorothiazide (HYDRODIURIL)  12.5 MG tablet Take 12.5 mg by mouth daily.     . metFORMIN (GLUMETZA) 500 MG (MOD) 24 hr tablet Take 500 mg by mouth daily with breakfast.      . mometasone (NASONEX) 50 MCG/ACT nasal spray Place 2 sprays into the nose daily. 7.5 g 0  . Multiple Vitamins-Minerals (MULTIVITAMIN WITH MINERALS) tablet Take 1 tablet by mouth daily.      Marland Kitchen omeprazole (PRILOSEC) 20 MG capsule take 1 capsule by mouth once daily if needed 30 capsule 6  . PROAIR HFA 108 (90 BASE) MCG/ACT inhaler inhale 2 puffs by mouth every 4 to 6 hours if needed for wheezing  AND/OR SHORTNESS OF BREATH 8.5 g 4  . Respiratory Therapy Supplies (FLUTTER) DEVI Use flutter valve 1 times daily as needed    . NITROSTAT 0.4 MG SL tablet as needed.     No current facility-administered medications on file prior to visit.     REVIEW OF SYSTEMS:  Positives indicated with an "X"  CARDIOVASCULAR:  [ ]  chest pain   [x ] chest pressure   [ ]  palpitations   [ ]  orthopnea   [x ] dyspnea on exertion   [ ]  claudication   [ ]  rest pain   [ ]  DVT   [ ]  phlebitis PULMONARY:   x[ ]  productive cough   [ x] asthma   [ x] wheezing NEUROLOGIC:   [ ]  weakness  [ ]  paresthesias  [ ]  aphasia  [ ]  amaurosis  [x ] dizziness HEMATOLOGIC:   [ ]  bleeding problems   [ ]  clotting disorders MUSCULOSKELETAL:  [ ]  joint pain   [ ]  joint swelling GASTROINTESTINAL: [ ]   blood in stool  [ ]   hematemesis GENITOURINARY:  [ ]   dysuria  [ ]   hematuria PSYCHIATRIC:  [ ]  history of major depression INTEGUMENTARY:  [ ]  rashes  [ ]  ulcers CONSTITUTIONAL:  [ ]  fever   [ ]  chills  PHYSICAL EXAMINATION:  General: The patient is a well-nourished female, in no acute distress. Vital signs are BP 125/61 mmHg  Pulse 84  Ht 5\' 3"  (1.6 m)  Wt 170 lb (77.111 kg)  BMI 30.12 kg/m2 Pulmonary: There is a good air exchange bilaterally without wheezing or rales. Abdomen: Soft and non-tender with normal pitch bowel sounds. Musculoskeletal: There are no major deformities.  There is no  significant extremity pain. Neurologic: No focal weakness or paresthesias are detected, Skin: There are no ulcer or rashes noted. Psychiatric: The patient has normal affect. Cardiovascular: There is a regular rate and rhythm without significant  murmur appreciated. Carotid arteries without bruits bilaterally Pulse status: 2+ radial 2+ femoral and 2+ posterior tibial pulses bilaterally   [facility] Vascular Lab Studies:  Ordered and Independently Reviewed reviewed this with patient. This shows no significant stenosis of her internal carotid arteries bilaterally. Velocities were somewhat less than the last study on the left. Maximal systolic velocities are 213 on the left with end-diastolic velocities of 32.  Impression and Plan:  No evidence of severe carotid disease. Her velocities today are somewhat less than the mild to moderate stenosis suggested in her study one year ago. Will continue usual activities without limitation. Explained that she has normal arterial flow to her feet bilaterally and therefore any numbness she has would not be related to arterial insufficiency most likely related to neuropathy. She will see [place] skin in 2 years for repeat carotid duplex    Miyanna Wiersma [facility] [place]: [phone]

## 2014-09-18 ENCOUNTER — Telehealth: Payer: Self-pay | Admitting: Interventional Cardiology

## 2014-09-18 DIAGNOSIS — R0600 Dyspnea, unspecified: Secondary | ICD-10-CM

## 2014-09-18 NOTE — Telephone Encounter (Signed)
New Message  Pt thinking aortic stenosis is acting up. Please call back and discuss.

## 2014-09-18 NOTE — Telephone Encounter (Signed)
Pt sts that years age Dr.Smith diagnosed her with Aortic stenosis after a cardiac cath.pt was seen in 2015 by Dr.Smith, there was no mention  of AS documented. There is no echo to reference. Pt sts that she has been having increased symptoms. Increased exertional dyspnea, dizziness. Pt does have copd and an inner ear problem that is why she has waited to call. Pt sts that symptoms are getting worse and she is fearful there might be progression of her AS.  Adv her that I will fwd an update to Dr.Smith to determine how to proceed. Repeat echo, or o/v  I will call back with his recommendation, pt verbalized understanding.

## 2014-09-19 NOTE — Telephone Encounter (Signed)
I can't find evidence of AS previously documented. We could justify echo given "dyspnea" to assess for evidence of systolic or diastolic heart failure. Please order 2D doppler echo for " Dyspnea".

## 2014-09-20 NOTE — Telephone Encounter (Signed)
Pt aware of Dr.Smith's response and recommendation. can't find evidence of AS previously documented. We could justify echo given "dyspnea" to assess for evidence of systolic or diastolic heart failure. Please order 2D doppler echo for " Dyspnea".  Adv pt a scheduler from our office will cal her to schedule the echo. Adv her we will also pre-cert with her insurance. Adv her that Dr.Smith will review the results and we will call her with the findings. Pt agreeable with plan and verbalized understanding.

## 2014-09-21 ENCOUNTER — Other Ambulatory Visit: Payer: Self-pay

## 2014-09-21 ENCOUNTER — Ambulatory Visit (HOSPITAL_COMMUNITY): Payer: Medicare Other | Attending: Cardiovascular Disease

## 2014-09-21 DIAGNOSIS — R0600 Dyspnea, unspecified: Secondary | ICD-10-CM | POA: Diagnosis not present

## 2014-09-21 DIAGNOSIS — I350 Nonrheumatic aortic (valve) stenosis: Secondary | ICD-10-CM | POA: Diagnosis not present

## 2014-09-21 DIAGNOSIS — I340 Nonrheumatic mitral (valve) insufficiency: Secondary | ICD-10-CM | POA: Diagnosis not present

## 2014-09-21 DIAGNOSIS — I358 Other nonrheumatic aortic valve disorders: Secondary | ICD-10-CM | POA: Diagnosis not present

## 2014-09-24 ENCOUNTER — Encounter: Payer: Self-pay | Admitting: Interventional Cardiology

## 2014-09-24 DIAGNOSIS — I350 Nonrheumatic aortic (valve) stenosis: Secondary | ICD-10-CM | POA: Insufficient documentation

## 2014-09-25 ENCOUNTER — Telehealth: Payer: Self-pay

## 2014-09-25 NOTE — Telephone Encounter (Signed)
Pt aware of echo results. Aortic valve is mildly stenosed.Heart size and function are normal.   Pt will f/u as planned in Aug 2016 with Dr.Smith Pt adv to call the office if sob worsens. Pt agreeable and verbalized understanding.

## 2014-09-25 NOTE — Telephone Encounter (Signed)
-----   Message from Belva Crome, MD sent at 09/24/2014 10:54 AM EDT ----- Aortic valve is mildly stenosed. Heart size and function are normal.

## 2014-10-05 ENCOUNTER — Encounter: Payer: Self-pay | Admitting: *Deleted

## 2014-10-05 ENCOUNTER — Telehealth: Payer: Self-pay | Admitting: Interventional Cardiology

## 2014-10-05 NOTE — Telephone Encounter (Signed)
Call was a direct transfer. C/o chest tightness, sob that has been off and on x 2 months, pt felt symptoms were due to her asthma causing the sob but now feels it is distinguishable as chest pressure that resolves with rest. Pt is not having any chest pain now but had an episode this morning while walking around house.  Pt has an app with Dr [name] in august but requests to be seen sooner. I reviewed with Cecilie Kicks NP and she would like pt to be seen this week, her schedule is full today, app made with flex for tomorrow with Sharrell Ku PA. Pt was told to go to ED with any further symptoms, pt has nitro at home for use also. Pt agreed to plan and verbalized understanding.

## 2014-10-05 NOTE — Telephone Encounter (Signed)
Pt c/o Shortness Of Breath: STAT if SOB developed within the last 24 hours or pt is noticeably SOB on the phone  1. Are you currently SOB (can you hear that pt is SOB on the phone)? Yes but don't hear it over phone  2. How long have you been experiencing SOB? Few months  3. Are you SOB when sitting or when up moving around? Moving around  4. Are you currently experiencing any other symptoms? Palpitations, and  Chest tightness

## 2014-10-06 ENCOUNTER — Encounter: Payer: Self-pay | Admitting: Physician Assistant

## 2014-10-06 ENCOUNTER — Ambulatory Visit (INDEPENDENT_AMBULATORY_CARE_PROVIDER_SITE_OTHER): Payer: Medicare Other | Admitting: Physician Assistant

## 2014-10-06 VITALS — BP 142/78 | HR 83 | Ht 63.5 in | Wt 172.8 lb

## 2014-10-06 DIAGNOSIS — E785 Hyperlipidemia, unspecified: Secondary | ICD-10-CM | POA: Insufficient documentation

## 2014-10-06 DIAGNOSIS — I2510 Atherosclerotic heart disease of native coronary artery without angina pectoris: Secondary | ICD-10-CM | POA: Diagnosis not present

## 2014-10-06 DIAGNOSIS — I350 Nonrheumatic aortic (valve) stenosis: Secondary | ICD-10-CM

## 2014-10-06 DIAGNOSIS — E669 Obesity, unspecified: Secondary | ICD-10-CM | POA: Insufficient documentation

## 2014-10-06 DIAGNOSIS — E119 Type 2 diabetes mellitus without complications: Secondary | ICD-10-CM | POA: Insufficient documentation

## 2014-10-06 DIAGNOSIS — R0602 Shortness of breath: Secondary | ICD-10-CM

## 2014-10-06 DIAGNOSIS — Z9861 Coronary angioplasty status: Secondary | ICD-10-CM

## 2014-10-06 DIAGNOSIS — R079 Chest pain, unspecified: Secondary | ICD-10-CM

## 2014-10-06 DIAGNOSIS — I10 Essential (primary) hypertension: Secondary | ICD-10-CM

## 2014-10-06 DIAGNOSIS — I25119 Atherosclerotic heart disease of native coronary artery with unspecified angina pectoris: Secondary | ICD-10-CM | POA: Insufficient documentation

## 2014-10-06 LAB — BASIC METABOLIC PANEL
BUN: 18 mg/dL (ref 6–23)
CHLORIDE: 99 meq/L (ref 96–112)
CO2: 33 mEq/L — ABNORMAL HIGH (ref 19–32)
Calcium: 10.1 mg/dL (ref 8.4–10.5)
Creatinine, Ser: 0.81 mg/dL (ref 0.40–1.20)
GFR: 73.09 mL/min (ref >60.00)
GLUCOSE: 127 mg/dL — AB (ref 70–99)
POTASSIUM: 4 meq/L (ref 3.5–5.1)
Sodium: 139 mEq/L (ref 135–145)

## 2014-10-06 LAB — CBC WITH DIFFERENTIAL/PLATELET
BASOS PCT: 0.5 % (ref 0.0–3.0)
Basophils Absolute: 0.1 10*3/uL (ref 0.0–0.1)
EOS PCT: 1.9 % (ref 0.0–5.0)
Eosinophils Absolute: 0.2 10*3/uL (ref 0.0–0.7)
HCT: 41.1 % (ref 36.0–46.0)
Hemoglobin: 13.8 g/dL (ref 12.0–15.0)
Lymphocytes Relative: 19.8 % (ref 12.0–46.0)
Lymphs Abs: 2.3 10*3/uL (ref 0.7–4.0)
MCHC: 33.5 g/dL (ref 30.0–36.0)
MCV: 90 fl (ref 78.0–100.0)
MONO ABS: 0.7 10*3/uL (ref 0.1–1.0)
Monocytes Relative: 5.7 % (ref 3.0–12.0)
Neutro Abs: 8.3 10*3/uL — ABNORMAL HIGH (ref 1.4–7.7)
Neutrophils Relative %: 72.1 % (ref 43.0–77.0)
Platelets: 257 10*3/uL (ref 150.0–400.0)
RBC: 4.57 Mil/uL (ref 3.87–5.11)
RDW: 14.5 % (ref 11.5–15.5)
WBC: 11.5 10*3/uL — AB (ref 4.0–10.5)

## 2014-10-06 LAB — PROTIME-INR
INR: 1.1 ratio — ABNORMAL HIGH (ref 0.8–1.0)
Prothrombin Time: 12.3 s (ref 9.6–13.1)

## 2014-10-06 MED ORDER — NITROGLYCERIN 0.4 MG SL SUBL: 0.4000 mg | SUBLINGUAL_TABLET | SUBLINGUAL | Status: DC | PRN

## 2014-10-06 NOTE — Progress Notes (Addendum)
Cardiology Office Note Date:  10/06/2014  Patient ID:  Cheryl Hernandez 07/17/1940, MRN [id number] PCP:  Tivis Ringer, MD  Cardiologist:  Dr. [name]  Chief Complaint: shortness of breath  History of Present Illness: Cheryl Hernandez is a 74 y.o. female with history of carotid artery disease (mild-moderate - followed by [facility]), HTN, GERD, HLD, COPD, CAD (NSTEMI 2009 s/p DES to RCAx2), DM, ventricular tachycardia, asthma who presents for evaluation of SOB. She called the office last month to report she felt that her "aortic stenosis was acting up" due to some dyspnea. 2D echo 09/21/14 showed EF 60-65%, moderate AS, mild MR. Last labs from 01/2014 showed Cr 0.8, Hgb 13.9, LDL 197, trig 243, TSH normal, A1C 6.0. 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  She has had exertional dyspnea for several months but over the last month or so has gradually noticed this happening with even lesser activity levels. She recently has noticed chest tightness and dyspnea with minimal exertion such as every day household ADLs. She also feels like her HR goes a little higher with this. When she stops and rests, this goes away. She says the discomfort is not really similar to prior MI pain except maybe a little (has a hard time deciding). She recalls having a "wiggling" sensation with her prior MI. She also does not notice the same fatigue she had with prior MI. It also feels different than her chronic asthmatic bronchitis. No recent wheezing, weight changes, LEE, bleeding. She sleeps with her head elevated slightly but has not noticed a difference in this lately. PCP follows lipids. No history of DVT/PE in self or family. She is not tachycardic,  tachypneic or hypoxic. Has not tried NTG.  Past Medical History  Diagnosis Date  . Asthma   . HTN (hypertension)   . Paroxysmal ventricular tachycardia   . GERD (gastroesophageal reflux disease)   . Diabetes mellitus   . Rhinitis   . Hyperlipidemia   . Arthritis   . COPD (chronic obstructive pulmonary disease)   . CAD (coronary artery disease)     a. NSTEMI 2009 s/p DES to RCAx2.  . Diverticulosis   . Chronic diarrhea   . Myocardial infarction   . Diabetes mellitus without complication   . Carotid artery disease     a. Mild-moderate - followed by [facility].  . Obesity   . Aortic stenosis     a. Mod AS by echo 08/2014.    Past Surgical History  Procedure Laterality Date  . Total abdominal hysterectomy    . Tonsillectomy    . Angioplasty    . Total hip arthroplasty    . Coronary angioplasty with stent placement      Current Outpatient Prescriptions  Medication Sig Dispense Refill  . aspirin EC 81 MG tablet Take 162 mg by mouth daily.     . B Complex-C (B-COMPLEX WITH VITAMIN C) tablet Take 1 tablet by mouth 2 (two) times a week.    . budesonide-formoterol (SYMBICORT) 80-4.5 MCG/ACT inhaler Inhale 1 puff into the lungs 2 (two) times daily. 1 Inhaler 11  . calcium carbonate (TUMS - DOSED IN MG ELEMENTAL CALCIUM) 500 MG chewable tablet Chew 1 tablet by mouth 3 (three) times  daily as needed. For heartburn.    . cholecalciferol (VITAMIN D) 1000 UNITS tablet Take 2,000 Units by mouth daily.     . Coenzyme Q10 (CO Q10) 100 MG TABS Take 1 tablet by mouth daily.      Marland Kitchen dextromethorphan-guaiFENesin (MUCINEX DM) 30-600 MG per 12 hr tablet Take 1 tablet by mouth every 12 (twelve) hours.      . Doxylamine Succinate, Sleep, (UNISOM) 25 MG tablet Take 12.5 mg by mouth at bedtime as needed. For sleep.    . fexofenadine (ALLEGRA) 180 MG tablet Take 180 mg by mouth daily.      . Glucosamine 500 MG CAPS Take 1 capsule by mouth daily.      . Glucose Blood (FREESTYLE LITE TEST VI) Once daily    .  hydrochlorothiazide (HYDRODIURIL) 12.5 MG tablet Take 12.5 mg by mouth daily.     . metFORMIN (GLUMETZA) 500 MG (MOD) 24 hr tablet Take 500 mg by mouth daily with breakfast.      . Multiple Vitamins-Minerals (MULTIVITAMIN WITH MINERALS) tablet Take 1 tablet by mouth daily.      . nebivolol (BYSTOLIC) 5 MG tablet Take 5 mg by mouth daily.    Marland Kitchen NITROSTAT 0.4 MG SL tablet Place 0.4 mg under the tongue as needed for chest pain.     Marland Kitchen omeprazole (PRILOSEC) 20 MG capsule take 1 capsule by mouth once daily if needed (Patient taking differently: take 1 capsule by mouth once daily if needed heartburn) 30 capsule 6  . PROAIR HFA 108 (90 BASE) MCG/ACT inhaler inhale 2 puffs by mouth every 4 to 6 hours if needed for wheezing  AND/OR SHORTNESS OF BREATH 8.5 g 4  . Respiratory Therapy Supplies (FLUTTER) DEVI Use flutter valve 1 times daily as needed     No current facility-administered medications for this visit.    Allergies:   Cefuroxime axetil; Cephalexin; Codeine; and Doxycycline   Social History:  The patient  reports that she quit smoking about 16 years ago. Her smoking use included Cigarettes. She has a 25 pack-year smoking history. She has never used smokeless tobacco.   Family History:  The patient's family history includes Allergies in her mother; Hyperlipidemia in her mother; Prostate cancer in her maternal grandfather.  ROS:  Please see the history of present illness.  All other systems are reviewed and otherwise negative.   PHYSICAL EXAM:  VS:  BP 142/78 mmHg  Pulse 83  Ht 5' 3.5" (1.613 m)  Wt 172 lb 12.8 oz (78.382 kg)  BMI 30.13 kg/m2  SpO2 96% BMI: Body mass index is 30.13 kg/(m^2). Well nourished, well developed WF in no acute distress HEENT: normocephalic, atraumatic Neck: no JVD, carotid bruits or masses Cardiac:  normal S1, S2; RRR; 2/6 SEM at RUSB. No rubs or gallops Lungs:  clear to auscultation bilaterally, no wheezing, rhonchi or rales Abd: soft, nontender, no  hepatomegaly, + BS MS: no deformity or atrophy Ext: no edema Skin: warm and dry, no rash Neuro:  moves all extremities spontaneously, no focal abnormalities noted, follows commands Psych: euthymic mood, full affect   EKG:  Done today shows NSR 83bpm, no acute ST-T changes - TWI avL, V2 are nonspecific  Recent Labs: No results found for requested labs within last 365 days.  No results found for requested labs within last 365 days.   CrCl cannot be calculated (Patient has no serum creatinine result on file.).   Wt Readings from Last 3 Encounters:  10/06/14 172 lb 12.8  oz (78.382 kg)  09/12/14 170 lb (77.111 kg)  06/21/14 173 lb (78.472 kg)     Other studies reviewed: Additional studies/records reviewed today include: summarized above  ASSESSMENT AND PLAN:  1. Chest discomfort/shortness of breath - symptoms are concerning for angina. I discussed R/L cath versus stress testing with the patient. I do think cath would be warranted for definitive evaluation of her symptoms and I do not see contraindication to doing so. Risks and benefits of cardiac catheterization have been discussed with the patient.  These include bleeding, infection, kidney damage, stroke, heart attack, death. She seems more inclined to proceed with stress test which may also be reasonable. She prefers at this time that I discuss with her primary cardiologist Dr. [name] to get his input. Of note she has no real physical exam findings to suggest CHF or PE today. Lungs clear. Not tachycardic, tachypneic or hypoxic. EKG is nonacute and she has not had any rest pain. Will continue ASA for now. Will refill SL NTG. Warning signs reviewed. Get baseline labs today in case we decide to proceed with cath, and to exclude any obvious reason for symptoms such as anemia. 2. CAD s/p prior PCI - see above. Continue ASA, BB. Consider statin initiation based on follow-up plan. 3. Aortic stenosis, moderate by recent echo - unclear if this is  contributing to symptoms - only moderate at this point, with normal LV function. 4. Essential HTN - initial BP checked at 98/60 but recheck by me bilaterally is 142/70. She reports home BP generally running 366Y systolic. 5. Obesity - once cleared from cardiac standpoint she should be encouraged to participate in regular exercise program. 6. Asthmatic bronchitis - not actively wheezing today. Maintained on symbicort.  Disposition: F/u with Dr. [name] as scheduled 10/2014, sooner if needed.  Current medicines are reviewed at length with the patient today.  The patient did not have any concerns regarding medicines.  Signed, Melina Copa PA-C 10/06/2014 1:42 PM     [address] [place] [address] [phone] (office)  [phone] (fax)

## 2014-10-06 NOTE — Patient Instructions (Signed)
Medication Instructions:  Your physician recommends that you continue on your current medications as directed. Please refer to the Current Medication list given to you today.  Labwork: Your physician recommends that you have lab work today. BMET, CBC, and PT/INR   Testing/Procedures: NONE  Follow-Up: Your physician recommends that you keep your already scheduled appointment with Dr. [name].    Any Other Special Instructions Will Be Listed Below (If Applicable).

## 2014-10-09 ENCOUNTER — Other Ambulatory Visit: Payer: Self-pay

## 2014-10-09 ENCOUNTER — Telehealth: Payer: Self-pay | Admitting: Physician Assistant

## 2014-10-09 DIAGNOSIS — D72829 Elevated white blood cell count, unspecified: Secondary | ICD-10-CM

## 2014-10-09 DIAGNOSIS — R0602 Shortness of breath: Secondary | ICD-10-CM

## 2014-10-09 DIAGNOSIS — R9431 Abnormal electrocardiogram [ECG] [EKG]: Secondary | ICD-10-CM

## 2014-10-09 NOTE — Addendum Note (Signed)
Addended by: Dalphine Handing on: 10/09/2014 08:38 AM   Modules accepted: Orders

## 2014-10-09 NOTE — Telephone Encounter (Signed)
I discussed this patient with her primary cardiologist Dr. [name] regarding cath vs nuc. He is OK with her proceeding with nuclear stress test. Please arrange Lexiscan nuclear stress test. Thx. Dayna Dunn PA-C

## 2014-10-09 NOTE — Telephone Encounter (Signed)
Called patient about proceeding with nuclear stress test. Order in for [facility]. Will send [facility] message to schedule stress test. Patient verbalized understanding.

## 2014-10-10 ENCOUNTER — Telehealth (HOSPITAL_COMMUNITY): Payer: Self-pay | Admitting: *Deleted

## 2014-10-10 ENCOUNTER — Telehealth: Payer: Self-pay | Admitting: Interventional Cardiology

## 2014-10-10 NOTE — Telephone Encounter (Signed)
Pt sts that she was previously prescribed lexapro for depression. She felt that she was doing ok and no longer needed the medication. So she stopped cold [place] 2 weeks ago. Pt noticed that she is jittery and is concerned about proceeding with her lexiscan scheduled for 7/14. Adv pt that if she is still having cardiac related symptoms we should proceed with the lexiscan. She should f/u with her pcp on resuming or permanently d/c lexapro. Pt verbalized understanding.

## 2014-10-10 NOTE — Telephone Encounter (Signed)
Patient given detailed instructions per Myocardial Perfusion Study Information Sheet for test on 10/12/14 at 1230. Patient Notified to arrive 15 minutes early, and that it is imperative to arrive on time for appointment to keep from having the test rescheduled. Patient verbalized understanding. Cheryl Hernandez, [name]

## 2014-10-10 NOTE — Telephone Encounter (Signed)
New Message     Pt wants you to return the call she would not state the reason for calling

## 2014-10-12 ENCOUNTER — Ambulatory Visit (HOSPITAL_COMMUNITY): Payer: Medicare Other | Attending: Internal Medicine

## 2014-10-12 DIAGNOSIS — R9431 Abnormal electrocardiogram [ECG] [EKG]: Secondary | ICD-10-CM | POA: Diagnosis not present

## 2014-10-12 DIAGNOSIS — R0602 Shortness of breath: Secondary | ICD-10-CM | POA: Diagnosis not present

## 2014-10-12 LAB — MYOCARDIAL PERFUSION IMAGING
CHL CUP NUCLEAR SRS: 6
LHR: 0.31
LV dias vol: 46 mL
LV sys vol: 12 mL
Peak HR: 101 {beats}/min
Rest HR: 78 {beats}/min
SDS: 0
SSS: 6
TID: 1.43

## 2014-10-12 MED ORDER — REGADENOSON 0.4 MG/5ML IV SOLN
0.4000 mg | Freq: Once | INTRAVENOUS | Status: AC
  Administered 2014-10-12: 0.4 mg via INTRAVENOUS

## 2014-10-12 MED ORDER — TECHNETIUM TC 99M SESTAMIBI GENERIC - CARDIOLITE
31.2000 | Freq: Once | INTRAVENOUS | Status: AC | PRN
  Administered 2014-10-12: 31.2 via INTRAVENOUS

## 2014-10-12 MED ORDER — TECHNETIUM TC 99M SESTAMIBI GENERIC - CARDIOLITE
10.2000 | Freq: Once | INTRAVENOUS | Status: AC | PRN
  Administered 2014-10-12: 10 via INTRAVENOUS

## 2014-10-13 ENCOUNTER — Ambulatory Visit
Admission: RE | Admit: 2014-10-13 | Discharge: 2014-10-13 | Disposition: A | Discharge status: Home / Self Care | Payer: Medicare Other | Source: Ambulatory Visit | Attending: Physician Assistant | Admitting: Physician Assistant

## 2014-10-13 DIAGNOSIS — R0602 Shortness of breath: Secondary | ICD-10-CM

## 2014-10-13 DIAGNOSIS — D72829 Elevated white blood cell count, unspecified: Secondary | ICD-10-CM

## 2014-10-23 ENCOUNTER — Telehealth: Payer: Self-pay | Admitting: Interventional Cardiology

## 2014-10-23 DIAGNOSIS — R0600 Dyspnea, unspecified: Secondary | ICD-10-CM

## 2014-10-23 DIAGNOSIS — R002 Palpitations: Secondary | ICD-10-CM

## 2014-10-23 NOTE — Telephone Encounter (Signed)
New Message   Pt is calling because she wants an appt with Dr. [name] Now  Thew next available is in [place] .Marland Kitchen...  She had an appt on 10-31-14 it was due to  Provider schedule on  (10/20/14 at 4:48 Called pt to r/s appt due to Dr. [name] being overbooked per Lattie Haw, offered pt Dr. Thompson Caul next availabel on 12/15/14 and pt refused and states she will just find another cardiologist-TR)  I reminded pt of why it was changed and why another was not scheduled, pt said she needs to see Dr. Now, I offered her to see a PA,   She declined again and states she needs to see her Dr. She wants to speak to Rn please call pt.

## 2014-10-25 NOTE — Telephone Encounter (Signed)
Dr.Smith is aware of the scheduling issue, and th ept appt being cancelled on 8/2. He will call the pt directly  to discuss his plan of care.

## 2014-10-26 NOTE — Telephone Encounter (Signed)
Please place 30 day monitor, Dx. Palpitations Please get CT pulmonary angiogram to r/o PE: Dx Dyspnea Keep September (?12/15/14) appointment and cancel 8/2 appointment.

## 2014-10-26 NOTE — Telephone Encounter (Signed)
Called pt.  Pt has spoken with Dr.Smith directly and is aware of his recommendation. Adv pt a scheduler from our office will call her to schedule her cardiac monitor and CT. Adv pt she may need a repeat bmet. Order is in epic fwd to pcc to schedule Pt has appt with Dr.Smith scheduled on 9/16 @ 10:45

## 2014-10-27 ENCOUNTER — Ambulatory Visit (INDEPENDENT_AMBULATORY_CARE_PROVIDER_SITE_OTHER): Payer: Medicare Other

## 2014-10-27 ENCOUNTER — Telehealth: Payer: Self-pay

## 2014-10-27 ENCOUNTER — Ambulatory Visit (INDEPENDENT_AMBULATORY_CARE_PROVIDER_SITE_OTHER)
Admission: RE | Admit: 2014-10-27 | Discharge: 2014-10-27 | Disposition: A | Discharge status: Home / Self Care | Payer: Medicare Other | Source: Ambulatory Visit | Attending: Interventional Cardiology | Admitting: Interventional Cardiology

## 2014-10-27 ENCOUNTER — Other Ambulatory Visit: Payer: Medicare Other

## 2014-10-27 DIAGNOSIS — R002 Palpitations: Secondary | ICD-10-CM | POA: Diagnosis not present

## 2014-10-27 DIAGNOSIS — R0600 Dyspnea, unspecified: Secondary | ICD-10-CM | POA: Diagnosis not present

## 2014-10-27 MED ORDER — IOHEXOL 350 MG/ML SOLN
100.0000 mL | Freq: Once | INTRAVENOUS | Status: AC | PRN
  Administered 2014-10-27: 100 mL via INTRAVENOUS

## 2014-10-27 NOTE — Telephone Encounter (Signed)
Cheryl Hernandez from CT wanted Dr. [name] to receive results on CT scan.  Dr. [name] is off today.  Sending verbal report to Dr. [name] from [place] in [place]:  No PE. Coronary artery calcification noted suggesting CAD.  Atherosclerosis of thoracic aorta without aneurysm  formation

## 2014-10-30 ENCOUNTER — Other Ambulatory Visit: Payer: Self-pay

## 2014-10-30 ENCOUNTER — Telehealth: Payer: Self-pay

## 2014-10-30 MED ORDER — NEBIVOLOL HCL 5 MG PO TABS: 5.0000 mg | ORAL_TABLET | Freq: Every day | ORAL | Status: DC

## 2014-10-30 NOTE — Telephone Encounter (Signed)
Pt aware of CT results. No blood clots have been identified in the lungs by CT scan Pt verbalized understanding.

## 2014-10-30 NOTE — Telephone Encounter (Signed)
-----   Message from Belva Crome, MD sent at 10/27/2014  6:20 PM EDT ----- No blood clots have been identified in the lungs by CT scan

## 2014-10-31 ENCOUNTER — Ambulatory Visit: Payer: Medicare Other | Admitting: Interventional Cardiology

## 2014-11-23 ENCOUNTER — Telehealth: Payer: Self-pay | Admitting: Interventional Cardiology

## 2014-11-23 NOTE — Telephone Encounter (Signed)
Returned pt call. Pt sts that " i am having a bad day" Pt sts thst she has been having increase palpitations,and it feels like her heart is racing when she moves around, this improves with rest. Pt reports her bp today was 160/88 92bpm. Pt denies chest pain, swelling, sob, nausea, fever, chills, cough. Pt is vague in the description of her symptoms. She is taking all meds as prescribed. Pt rqst to come in to see Dr.Smith tomorrow. Adv her that Dr.Smith is working @ [employer] today and tomorrow. Offered her an appt with a PA/NP she refused. She stated that "maybe I need to find a new cardiologist"  Adv her that all of the cardiologist have days when they are just in the hospital and they are not in the office everyday. That is why we have extenders to help take care of patients when there physicians are not here. Pt is currently wearing an event monitor, adv her that we have not received any alerts. She will complete her monitor on 8/29.   Offered pt a work in appt with [place] next wk. She feels that is not soon enough. Adv her that if she feels her symptoms cannot wait she could f/u with her pcp, got to an urgent care, or the ED. Pt sts that she will f/u with her pcp. She will call back if she needs a sooner appt with Dr.Smith.  Will rqst pre-lim event strips for Dr.Smith to review. FYI fwd to Dr.Smith

## 2014-11-23 NOTE — Telephone Encounter (Signed)
New message      Pt request to talk to Lisa-----she would not tell me what she wanted

## 2014-11-23 NOTE — Telephone Encounter (Signed)
Thanks

## 2014-11-24 NOTE — Telephone Encounter (Signed)
F/u  Pt requested to speak w/ RN concerning BP addressed in previous note. Pt would not go into detail. Please call back and discuss.

## 2014-11-24 NOTE — Telephone Encounter (Signed)
Spoke with Cheryl Hernandez. She reports blood pressure has been elevated as outlined below.  She reports other readings yesterday of 172/90 and 185/94.  She reports blood pressure goes up with any activity. Blood pressure better today but she does not know reading. She reports having bad days for several months. Was going to see Dr. Elsworth Soho today but he is not in the office and she does not want to see anyone else.  She thinks she needs a change in medication.   States Cheryl Hernandez told her she could see Dr. [name] at 8:00 next Tuesday.  Will send to Dr. [name] to see if she can be added at this time.

## 2014-11-25 NOTE — Telephone Encounter (Signed)
I'm in hospital on Tuesday.

## 2014-11-25 NOTE — Telephone Encounter (Signed)
seee below

## 2014-11-27 NOTE — Telephone Encounter (Signed)
Pt advised Dr [name] not in office until 11/29/14, will forward to Lattie Haw to follow up with her 11/28/14 about appt with Dr [name].

## 2014-11-28 NOTE — Telephone Encounter (Signed)
Returned pt call. Pt rqst to see Dr.Smith only.Pt aware of work in appt with [place]. Scheduled on 8/31 @ 8am. Pt voiced appreciation and verbalized understanding.

## 2014-11-28 NOTE — Progress Notes (Signed)
Cardiology Office Note   Date:  11/29/2014   ID:  Cheryl, Hernandez 1940-10-26, MRN [id number]  PCP:  Tivis Ringer, MD  Cardiologist:  Sinclair Grooms, MD   Chief Complaint  Patient presents with  . Coronary Artery Disease      History of Present Illness: Cheryl Hernandez is a 74 y.o. female who presents for CAD, hypertension, aortic stenosis and carotid disease,  Exertional dyspnea with increased heart rate and elevated blood pressure very concerning to the patient. She began monitoring her vital signs because of the dyspnea. We have undergone significant evaluation including a CT angiogram of the lungs without demonstrating pulmonary emboli. She had a normal myocardial perfusion study. Chest x-ray does not reveal evidence of pulmonary fibrosis. Echocardiogram demonstrated "mild aortic stenosis" although her exam suggests that perhaps things are worse then identified.  Past Medical History  Diagnosis Date  . Asthma   . HTN (hypertension)   . Paroxysmal ventricular tachycardia   . GERD (gastroesophageal reflux disease)   . Diabetes mellitus   . Rhinitis   . Hyperlipidemia   . Arthritis   . COPD (chronic obstructive pulmonary disease)   . CAD (coronary artery disease)     a. NSTEMI 2009 s/p DES to RCAx2.  . Diverticulosis   . Chronic diarrhea   . Myocardial infarction   . Diabetes mellitus without complication   . Carotid artery disease     a. Mild-moderate - followed by [facility].  . Obesity   . Aortic stenosis     a. Mod AS by echo 08/2014.    Past Surgical History  Procedure Laterality Date  . Total abdominal hysterectomy    . Tonsillectomy    . Angioplasty    . Total hip arthroplasty    . Coronary angioplasty with stent placement       Current Outpatient Prescriptions  Medication Sig Dispense Refill  . aspirin EC 81 MG tablet Take 162 mg by mouth daily.     . B Complex-C (B-COMPLEX WITH VITAMIN C) tablet Take 1 tablet by mouth 2 (two) times a  week.    . budesonide-formoterol (SYMBICORT) 80-4.5 MCG/ACT inhaler Inhale 1 puff into the lungs 2 (two) times daily. 1 Inhaler 11  . calcium carbonate (TUMS - DOSED IN MG ELEMENTAL CALCIUM) 500 MG chewable tablet Chew 1 tablet by mouth 3 (three) times daily as needed. For heartburn.    . cholecalciferol (VITAMIN D) 1000 UNITS tablet Take 2,000 Units by mouth daily.     . Coenzyme Q10 (CO Q10) 100 MG TABS Take 1 tablet by mouth daily.      . Doxylamine Succinate, Sleep, (UNISOM) 25 MG tablet Take 12.5 mg by mouth at bedtime as needed. For sleep.    . fexofenadine (ALLEGRA) 180 MG tablet Take 180 mg by mouth daily.      . Glucosamine 500 MG CAPS Take 1 capsule by mouth daily.      . hydrochlorothiazide (HYDRODIURIL) 12.5 MG tablet Take 12.5 mg by mouth daily.     . metFORMIN (GLUMETZA) 500 MG (MOD) 24 hr tablet Take 500 mg by mouth daily with breakfast.      . Multiple Vitamins-Minerals (MULTIVITAMIN WITH MINERALS) tablet Take 1 tablet by mouth daily.      . nitroGLYCERIN (NITROSTAT) 0.4 MG SL tablet Place 1 tablet (0.4 mg total) under the tongue as needed for chest pain. 25 tablet 3  . omeprazole (PRILOSEC) 20 MG capsule take 1 capsule by mouth  once daily if needed (Patient taking differently: take 1 capsule by mouth once daily if needed heartburn) 30 capsule 6  . PROAIR HFA 108 (90 BASE) MCG/ACT inhaler inhale 2 puffs by mouth every 4 to 6 hours if needed for wheezing  AND/OR SHORTNESS OF BREATH 8.5 g 4  . amLODipine (NORVASC) 5 MG tablet Take 1 tablet (5 mg total) by mouth daily. 30 tablet 5  . Glucose Blood (FREESTYLE LITE TEST VI) Once daily    . Respiratory Therapy Supplies (FLUTTER) DEVI Use flutter valve 1 times daily as needed     No current facility-administered medications for this visit.    Allergies:   Cefuroxime axetil; Cephalexin; Codeine; and Doxycycline    Social History:  The patient  reports that she quit smoking about 16 years ago. Her smoking use included Cigarettes. She  has a 25 pack-year smoking history. She has never used smokeless tobacco.   Family History:  The patient's family history includes Allergies in her mother; Hyperlipidemia in her mother; Prostate cancer in her maternal grandfather.    ROS:  Please see the history of present illness.   Otherwise, review of systems are positive for anxiety and concern that coronary aneurysm is getting worse..   All other systems are reviewed and negative.    PHYSICAL EXAM: VS:  BP 132/80 mmHg  Pulse 84  Ht 5' 3.5" (1.613 m)  Wt 77.111 kg (170 lb)  BMI 29.64 kg/m2 , BMI Body mass index is 29.64 kg/(m^2). GEN: Well nourished, well developed, in no acute distress HEENT: normal Neck: no JVD, carotid bruits, or masses. Carotid upstroke is slightly diminished in amplitude. Cardiac: RRR; 3/6 systolic murmur of AS Respiratory:  clear to auscultation bilaterally, normal work of breathing GI: soft, nontender, nondistended, + BS MS: no deformity or atrophy Skin: warm and dry, no rash Neuro:  Strength and sensation are intact Psych: euthymic mood, full affect   EKG:  EKG is not ordered today.    Recent Labs: 10/06/2014: BUN 18; Creatinine, Ser 0.81; Hemoglobin 13.8; Platelets 257.0; Potassium 4.0; Sodium 139    Lipid Panel    Component Value Date/Time   CHOL * 12/11/2007 0350    302        ATP III CLASSIFICATION:  <200     mg/dL   Desirable  200-239  mg/dL   Borderline High  >=240    mg/dL   High   TRIG 158* 12/11/2007 0350   HDL 48 12/11/2007 0350   CHOLHDL 6.3 12/11/2007 0350   VLDL 32 12/11/2007 0350   LDLCALC * 12/11/2007 0350    222        Total Cholesterol/HDL:CHD Risk Coronary Heart Disease Risk Table                     Men   Women  1/2 Average Risk   3.4   3.3      Wt Readings from Last 3 Encounters:  11/29/14 77.111 kg (170 lb)  10/12/14 78.019 kg (172 lb)  10/06/14 78.382 kg (172 lb 12.8 oz)      Other studies Reviewed: Additional studies/ records that were reviewed today  include: Reviewed all the recent data and discussed with patient. Review of the above records demonstrates:    ASSESSMENT AND PLAN:  1. Aortic stenosis Mild by recent echo, but clinically worse on exam.   2. CAD in native artery  Known ectasia/aneurysm of the coronary arteries, involving the LAD and circumflex.  3. Essential hypertension Poor control  4. Limiting exertional dyspnea: Etiology is uncertain.  Current medicines are reviewed at length with the patient today.  The patient has concerns regarding medicines.  The following changes have been made:  We will discontinue Bystolic and start amlodipine 5 mg per day .  Patient will give [place] a call within the next 5-7 days and inform us if there is any improvement. If none, we will perform right and left heart cath with coronary angiogram to make sure we are not missing significant AS.  If we go all the way to heart catheterization and find no explanation, she will be referred back to pulmonary.  Labs/ tests ordered today include:  No orders of the defined types were placed in this encounter.     Disposition:   FU with HS in 2 weeks  Signed, Sinclair Grooms, MD  11/29/2014 11:49 AM    [address], [place], [place]  [address] Phone: [phone]; Fax: [phone]

## 2014-11-29 ENCOUNTER — Encounter: Payer: Self-pay | Admitting: Interventional Cardiology

## 2014-11-29 ENCOUNTER — Ambulatory Visit (INDEPENDENT_AMBULATORY_CARE_PROVIDER_SITE_OTHER): Payer: Medicare Other | Admitting: Interventional Cardiology

## 2014-11-29 VITALS — BP 132/80 | HR 84 | Ht 63.5 in | Wt 170.0 lb

## 2014-11-29 DIAGNOSIS — I2510 Atherosclerotic heart disease of native coronary artery without angina pectoris: Secondary | ICD-10-CM | POA: Diagnosis not present

## 2014-11-29 DIAGNOSIS — I350 Nonrheumatic aortic (valve) stenosis: Secondary | ICD-10-CM | POA: Diagnosis not present

## 2014-11-29 DIAGNOSIS — I10 Essential (primary) hypertension: Secondary | ICD-10-CM | POA: Diagnosis not present

## 2014-11-29 MED ORDER — AMLODIPINE BESYLATE 5 MG PO TABS: 5.0000 mg | ORAL_TABLET | Freq: Every day | ORAL | Status: DC

## 2014-11-29 NOTE — Patient Instructions (Signed)
Medication Instructions:  Your physician has recommended you make the following change in your medication:  1) STOP Bystolic 2) START Amlodipine 5mg  daily. An Rx has been sent to your pharmacy   Labwork: None ordered  Testing/Procedures: None ordered  Follow-Up: Follow up as planned  Any Other Special Instructions Will Be Listed Below (If Applicable). Call the office in 5-7 days with an update of symptoms

## 2014-12-01 ENCOUNTER — Telehealth: Payer: Self-pay

## 2014-12-01 NOTE — Telephone Encounter (Signed)
-----   Message from Belva Crome, MD sent at 11/30/2014  6:32 PM EDT ----- Discussed with the patient. Reiterate to her that the monitor was normal.

## 2014-12-01 NOTE — Telephone Encounter (Signed)
Pt aware of monitor results. 1. Normal sinus rhythm   Normal  Pt verbalized understanding.

## 2014-12-06 ENCOUNTER — Encounter: Payer: Self-pay | Admitting: Critical Care Medicine

## 2014-12-06 ENCOUNTER — Ambulatory Visit (INDEPENDENT_AMBULATORY_CARE_PROVIDER_SITE_OTHER): Payer: Medicare Other | Admitting: Critical Care Medicine

## 2014-12-06 VITALS — BP 140/76 | HR 99 | Temp 98.3°F | Ht 63.5 in | Wt 172.8 lb

## 2014-12-06 DIAGNOSIS — J4540 Moderate persistent asthma, uncomplicated: Secondary | ICD-10-CM | POA: Diagnosis not present

## 2014-12-06 DIAGNOSIS — R0602 Shortness of breath: Secondary | ICD-10-CM | POA: Diagnosis not present

## 2014-12-06 MED ORDER — FLUTICASONE FUROATE-VILANTEROL 200-25 MCG/INH IN AEPB: 1.0000 | INHALATION_SPRAY | Freq: Every day | RESPIRATORY_TRACT | Status: DC

## 2014-12-06 NOTE — Patient Instructions (Signed)
Stop symbicort  Start Breo one puff daily Use flutter  Valve 3-4 times daily Return 6 weeks with DR Ashok Cordia

## 2014-12-06 NOTE — Progress Notes (Signed)
Subjective:    Patient ID: Cheryl Hernandez, female    DOB: 1941-03-18, 74 y.o.   MRN: [id number]  HPI 12/06/2014 Chief Complaint  Patient presents with  . 6 month follow up    increased SOB with less exertion over the past few months.  Cough unchnaged - prod with white mucus.  No chest tightness or CP.    Pt noted more dyspnea .  ? Cardiac, had a workup.  Neg stress test, echo, ct angio chest .  Essentially cardiac eval was completely unremarkable Patient states mucus tends to get trapped in the upper airway and then she has to cough hard to get out however she states the flutter valve is very helpful in this regard Pt notes with ambulation gets dyspneic.  Worse with strenous vacuuming.  No qhs dyspnea.  Notes a chronic cough with mucus production.  No real wheeze.  Mucus builds up.   Pt denies any significant sore throat, nasal congestion or excess secretions, fever, chills, sweats, unintended weight loss, pleurtic or exertional chest pain, orthopnea PND, or leg swelling Pt denies any increase in rescue therapy over baseline, denies waking up needing it or having any early am or nocturnal exacerbations of coughing/wheezing/or dyspnea. Pt also denies any obvious fluctuation in symptoms with  weather or environmental change or other alleviating or aggravating factors   Current Medications, Allergies, Complete Past Medical History, Past Surgical History, Family History, and Social History were reviewed in [name] record per todays encounter:  12/06/2014  Review of Systems  Constitutional: Negative for appetite change.  HENT: Positive for postnasal drip. Negative for ear pain, rhinorrhea, sinus pressure, sore throat, trouble swallowing and voice change.   Eyes: Negative.   Respiratory: Positive for cough and shortness of breath. Negative for apnea, choking, chest tightness, wheezing and stridor.   Cardiovascular: Negative.  Negative for chest pain, palpitations and  leg swelling.  Gastrointestinal: Negative.  Negative for nausea, vomiting, abdominal pain and abdominal distention.  Genitourinary: Negative.   Musculoskeletal: Negative.  Negative for myalgias and arthralgias.  Skin: Negative.  Negative for rash.  Allergic/Immunologic: Negative.  Negative for environmental allergies and food allergies.  Neurological: Negative.  Negative for dizziness, syncope, weakness and headaches.  Hematological: Negative.  Negative for adenopathy. Does not bruise/bleed easily.  Psychiatric/Behavioral: Negative.  Negative for sleep disturbance and agitation. The patient is not nervous/anxious.        Objective:   Physical Exam Filed Vitals:   12/06/14 1121  BP: 140/76  Pulse: 99  Temp: 98.3 F (36.8 C)  TempSrc: Oral  Height: 5' 3.5" (1.613 m)  Weight: 172 lb 12.8 oz (78.382 kg)  SpO2: 96%    Gen: Pleasant, well-nourished, in no distress,  normal affect  ENT: No lesions,  mouth clear,  oropharynx clear, no postnasal drip  Neck: No JVD, no TMG, no carotid bruits  Lungs: No use of accessory muscles, no dullness to percussion, prominent pseudo-wheeze  Cardiovascular: RRR, heart sounds normal, no murmur or gallops, no peripheral edema  Abdomen: soft and NT, no HSM,  BS normal  Musculoskeletal: No deformities, no cyanosis or clubbing  Neuro: alert, non focal  Skin: Warm, no lesions or rashes  No results found.     Assessment & Plan:  I personally reviewed all images and lab data in the [facility] system as well as any outside material available during this office visit and agree with the  radiology impressions.   Asthma, moderate persistent Moderate persistent asthma  with nonadherence to Symbicort secondary to tachycardia and hypertension Ongoing airway inflammation with predominant upper airway component Note current spirometry showed normal FEV1 and FVC but evidence for upper airway instability on flow volume loop Suspect some element of the patient's  issue is vocal cord dysfunction Plan To improve adherence will switch from Symbicort to once daily Breo 200 1 puff daily Patient encouraged to continue using flutter valve   Cheryl Hernandez was seen today for 6 month follow up.  Diagnoses and all orders for this visit:  SOB (shortness of breath) -     Spirometry with Graph  Asthma, moderate persistent, uncomplicated  Other orders -     Fluticasone Furoate-Vilanterol (BREO ELLIPTA) 200-25 MCG/INH AEPB; Inhale 1 puff into the lungs daily.    I had an extended discussion with the patient and or family lasting 10 minutes of a 25 minute visit including:  Need to switch therapies and improved patient activation of treatment for asthma need for flutter valve to improve secretion control and upper airway

## 2014-12-07 NOTE — Assessment & Plan Note (Signed)
Moderate persistent asthma with nonadherence to Symbicort secondary to tachycardia and hypertension Ongoing airway inflammation with predominant upper airway component Note current spirometry showed normal FEV1 and FVC but evidence for upper airway instability on flow volume loop Suspect some element of the patient's issue is vocal cord dysfunction Plan To improve adherence will switch from Symbicort to once daily Breo 200 1 puff daily Patient encouraged to continue using flutter valve

## 2014-12-15 ENCOUNTER — Encounter: Payer: Self-pay | Admitting: Interventional Cardiology

## 2014-12-15 ENCOUNTER — Ambulatory Visit (INDEPENDENT_AMBULATORY_CARE_PROVIDER_SITE_OTHER): Payer: Medicare Other | Admitting: Interventional Cardiology

## 2014-12-15 VITALS — BP 132/70 | HR 102 | Ht 63.5 in | Wt 173.0 lb

## 2014-12-15 DIAGNOSIS — I10 Essential (primary) hypertension: Secondary | ICD-10-CM

## 2014-12-15 DIAGNOSIS — E785 Hyperlipidemia, unspecified: Secondary | ICD-10-CM | POA: Diagnosis not present

## 2014-12-15 DIAGNOSIS — I350 Nonrheumatic aortic (valve) stenosis: Secondary | ICD-10-CM

## 2014-12-15 DIAGNOSIS — R0600 Dyspnea, unspecified: Secondary | ICD-10-CM

## 2014-12-15 DIAGNOSIS — I479 Paroxysmal tachycardia, unspecified: Secondary | ICD-10-CM | POA: Diagnosis not present

## 2014-12-15 DIAGNOSIS — I2510 Atherosclerotic heart disease of native coronary artery without angina pectoris: Secondary | ICD-10-CM

## 2014-12-15 LAB — BRAIN NATRIURETIC PEPTIDE: Pro B Natriuretic peptide (BNP): 28 pg/mL (ref 0.0–100.0)

## 2014-12-15 LAB — TSH: TSH: 1.76 u[IU]/mL (ref 0.35–4.50)

## 2014-12-15 NOTE — Progress Notes (Signed)
Cardiology Office Note   Date:  12/15/2014   ID:  Cheryl Hernandez, DOB 1940-04-06, MRN [id number]  PCP:  Tivis Ringer, MD  Cardiologist:  Sinclair Grooms, MD   Chief Complaint  Patient presents with  . Coronary Artery Disease      History of Present Illness: Cheryl Hernandez is a 74 y.o. female who presents for CAD, DES RCA 2 2009, paroxysmal palpitations, hypertension, hyperlipidemia, moderate aortic stenosis, and diabetes mellitus type 2.   She still complains of exertional intolerance and dyspnea. Cardiac evaluation has included a pulmonary CT angiogram negative for PE, an echocardiogram demonstrating normal LV systolic function, a myocardial perfusion study that was low risk, no evidence of anemia , continuous ambulatory monitor, or electrolyte problems.  She stopped taking bystolic and states that she feels some improved.   She saw Dr. Asencion Noble who gave specific instructions concerning pulmonary therapy with which he has not complied.   Past Medical History  Diagnosis Date  . Asthma   . HTN (hypertension)   . Paroxysmal ventricular tachycardia   . GERD (gastroesophageal reflux disease)   . Diabetes mellitus   . Rhinitis   . Hyperlipidemia   . Arthritis   . COPD (chronic obstructive pulmonary disease)   . CAD (coronary artery disease)     a. NSTEMI 2009 s/p DES to RCAx2.  . Diverticulosis   . Chronic diarrhea   . Myocardial infarction   . Diabetes mellitus without complication   . Carotid artery disease     a. Mild-moderate - followed by [facility].  . Obesity   . Aortic stenosis     a. Mod AS by echo 08/2014.    Past Surgical History  Procedure Laterality Date  . Total abdominal hysterectomy    . Tonsillectomy    . Angioplasty    . Total hip arthroplasty    . Coronary angioplasty with stent placement       Current Outpatient Prescriptions  Medication Sig Dispense Refill  . amLODipine (NORVASC) 5 MG tablet Take 1 tablet (5 mg total) by  mouth daily. 30 tablet 5  . aspirin EC 81 MG tablet Take 162 mg by mouth daily.     . B Complex-C (B-COMPLEX WITH VITAMIN C) tablet Take 1 tablet by mouth 2 (two) times a week.    . calcium carbonate (TUMS - DOSED IN MG ELEMENTAL CALCIUM) 500 MG chewable tablet Chew 1 tablet by mouth 3 (three) times daily as needed. For heartburn.    . cholecalciferol (VITAMIN D) 1000 UNITS tablet Take 2,000 Units by mouth daily.     . Coenzyme Q10 (CO Q10) 100 MG TABS Take 1 tablet by mouth daily.      . Doxylamine Succinate, Sleep, (UNISOM) 25 MG tablet Take 12.5 mg by mouth at bedtime as needed. For sleep.    . fexofenadine (ALLEGRA) 180 MG tablet Take 180 mg by mouth daily.      . Fluticasone Furoate-Vilanterol (BREO ELLIPTA) 200-25 MCG/INH AEPB Inhale 1 puff into the lungs daily. 60 each 6  . Glucosamine 500 MG CAPS Take 1 capsule by mouth daily.      . Glucose Blood (FREESTYLE LITE TEST VI) Once daily    . hydrochlorothiazide (HYDRODIURIL) 12.5 MG tablet Take 12.5 mg by mouth daily.     . metFORMIN (GLUMETZA) 500 MG (MOD) 24 hr tablet Take 500 mg by mouth daily with breakfast.      . Multiple Vitamins-Minerals (MULTIVITAMIN WITH MINERALS) tablet Take  1 tablet by mouth daily.      . nitroGLYCERIN (NITROSTAT) 0.4 MG SL tablet Place 1 tablet (0.4 mg total) under the tongue as needed for chest pain. 25 tablet 3  . omeprazole (PRILOSEC) 20 MG capsule take 1 capsule by mouth once daily if needed (Patient taking differently: take 1 capsule by mouth once daily if needed heartburn) 30 capsule 6  . PROAIR HFA 108 (90 BASE) MCG/ACT inhaler inhale 2 puffs by mouth every 4 to 6 hours if needed for wheezing  AND/OR SHORTNESS OF BREATH 8.5 g 4  . Respiratory Therapy Supplies (FLUTTER) DEVI Use flutter valve 1 times daily as needed     No current facility-administered medications for this visit.    Allergies:   Cefuroxime axetil; Cephalexin; Codeine; and Doxycycline    Social History:  The patient  reports that she  quit smoking about 16 years ago. Her smoking use included Cigarettes. She has a 25 pack-year smoking history. She has never used smokeless tobacco.   Family History:  The patient's family history includes Allergies in her mother; Hyperlipidemia in her mother; Prostate cancer in her maternal grandfather.    ROS:  Please see the history of present illness.   Otherwise, review of systems are positive for  Cough,  Difficulty sleeping,  sympathomimetics cause jitteriness and tachycardia,.   All other systems are reviewed and negative.    PHYSICAL EXAM: VS:  BP 132/70 mmHg  Pulse 102  Ht 5' 3.5" (1.613 m)  Wt 78.472 kg (173 lb)  BMI 30.16 kg/m2  SpO2 96% , BMI Body mass index is 30.16 kg/(m^2). GEN: Well nourished, well developed, in no acute distress HEENT: normal Neck: no JVD, carotid bruits, or masses Cardiac: RRR.  There is no murmur, rub, or gallop. There is no edema. Respiratory:  clear to auscultation bilaterally, normal work of breathing. GI: soft, nontender, nondistended, + BS MS: no deformity or atrophy Skin: warm and dry, no rash Neuro:  Strength and sensation are intact Psych: euthymic mood, full affect   EKG:  EKG is not ordered today   Recent Labs: 10/06/2014: BUN 18; Creatinine, Ser 0.81; Hemoglobin 13.8; Platelets 257.0; Potassium 4.0; Sodium 139    Lipid Panel    Component Value Date/Time   CHOL * 12/11/2007 0350    302        ATP III CLASSIFICATION:  <200     mg/dL   Desirable  200-239  mg/dL   Borderline High  >=240    mg/dL   High   TRIG 158* 12/11/2007 0350   HDL 48 12/11/2007 0350   CHOLHDL 6.3 12/11/2007 0350   VLDL 32 12/11/2007 0350   LDLCALC * 12/11/2007 0350    222        Total Cholesterol/HDL:CHD Risk Coronary Heart Disease Risk Table                     Men   Women  1/2 Average Risk   3.4   3.3      Wt Readings from Last 3 Encounters:  12/15/14 78.472 kg (173 lb)  12/06/14 78.382 kg (172 lb 12.8 oz)  11/29/14 77.111 kg (170 lb)       Other studies Reviewed: Additional studies/ records that were reviewed today include: Reviewed all studies listed above. Discussed this with the patient. Reviewed the recent pulmonary no by Dr. Asencion Noble..    ASSESSMENT AND PLAN:  1. CAD in native artery No symptoms to suggest ongoing  angina  2. Paroxysmal tachycardia Appears to be related to sinus tachycardia/no arrhythmia was noted on Holter monitor  3. Hyperlipidemia On therapy and followed by primary care  4. Essential hypertension Excellent control  5. Aortic stenosis Moderate at worst  7. Dyspnea Dyspnea is likely multifactorial including deconditioning, reactive airways disease, and perhaps a component of diastolic dysfunction although this is been difficult to prove.  Current medicines are reviewed at length with the patient today.  The patient has the following concerns regarding medicines:  We will check a TSH and BNP.  The following changes/actions have been instituted:     TSH   BNP   Compliance with pulmonary recommendations.  Labs/ tests ordered today include:  instructions   Orders Placed This Encounter  Procedures  . B Nat Peptide  . TSH     Disposition:   FU with HS in 1 year  Signed, Sinclair Grooms, MD  12/15/2014 1:10 PM    [facility] [address], [place], [place]  [address] Phone: [phone]; Fax: [phone]

## 2014-12-15 NOTE — Patient Instructions (Addendum)
Medication Instructions:  Your physician recommends that you continue on your current medications as directed. Please refer to the Current Medication list given to you today.   Labwork: Tsh, Bnp today  Testing/Procedures: None ordered  Follow-Up: Your physician recommends that you schedule a follow-up appointment in 1 year or as needed    Any Other Special Instructions Will Be Listed Below (If Applicable).

## 2015-01-17 ENCOUNTER — Ambulatory Visit: Payer: Medicare Other | Admitting: Pulmonary Disease

## 2015-02-14 ENCOUNTER — Other Ambulatory Visit: Payer: Self-pay | Admitting: Critical Care Medicine

## 2015-02-19 ENCOUNTER — Other Ambulatory Visit: Payer: Self-pay | Admitting: Interventional Cardiology

## 2015-02-19 ENCOUNTER — Telehealth: Payer: Self-pay | Admitting: Pulmonary Disease

## 2015-02-19 MED ORDER — BUDESONIDE-FORMOTEROL FUMARATE 80-4.5 MCG/ACT IN AERO: 2.0000 | INHALATION_SPRAY | Freq: Two times a day (BID) | RESPIRATORY_TRACT | Status: DC

## 2015-02-19 NOTE — Telephone Encounter (Signed)
rx sent  Pt aware Nothing further needed. 

## 2015-02-19 NOTE — Telephone Encounter (Signed)
Called pt to verify whether or not see is taking Bystolic. Pt Bystolic was d/c and Amlodipine started on 8/31. Pt sts that Amlodipine was not working for her, she stopped and resumed Bystolic 5mg  qd in September. Adv pt that I will fwd Dr.Smith an update a rqst for approval to refill Bystolic. Pt verbalized understanding.

## 2015-02-19 NOTE — Telephone Encounter (Signed)
Pt calling requesting a refill on Bystolic 10 mg taken daily. I explained to the pt that this medication is not on her med list and I would have to send the request to Dr. Thompson Caul assistant, Lattie Haw, CMA. Please advise

## 2015-02-19 NOTE — Telephone Encounter (Signed)
Per 12/06/14 OV: Patient Instructions       Stop symbicort   Start Breo one puff daily Use flutter  Valve 3-4 times daily Return 6 weeks with DR Ashok Cordia  --  Former [facility] pt. Called spoke with pt. She reports she did not like the Breo and she went back to symbicort. She is now out of symbicort and wants this refilled. She has pending appt with Dr. Ashok Cordia 04/17/15 (new visit with him). TP, please advise if okay for pt to go back to symbicort and refill this medication until pending appt? thanks

## 2015-02-19 NOTE — Telephone Encounter (Signed)
That is fine 

## 2015-02-20 ENCOUNTER — Telehealth: Payer: Self-pay | Admitting: Interventional Cardiology

## 2015-02-20 NOTE — Telephone Encounter (Signed)
Pt is calling again requesting a refill on Bystolic, which is not on her med list. I tried to explain to the pt that we are waiting for Dr. [name] to respond back and pt hung up on me. Please advise

## 2015-02-21 MED ORDER — NEBIVOLOL HCL 5 MG PO TABS: 5.0000 mg | ORAL_TABLET | Freq: Every day | ORAL | Status: DC

## 2015-02-21 NOTE — Telephone Encounter (Signed)
Please refill Bystolic 5 mg daily. Update the med list. Take amlodipine off list.

## 2015-02-21 NOTE — Telephone Encounter (Signed)
Called Cheryl Hernandez to inform her that Dr. [name] refills Bystolic 5 mg taken daily and stop Amlodipine, per phone note on 02/20/15. I advised the Cheryl Hernandez that if she has any other problems, questions or concerns to call the office. Cheryl Hernandez verbalized understanding.

## 2015-02-21 NOTE — Telephone Encounter (Signed)
Pt's medication has been taken care of.

## 2015-02-21 NOTE — Telephone Encounter (Signed)
Yes. Please remove amlodipine for med list and updated the Bystolic prescription.

## 2015-04-16 ENCOUNTER — Telehealth: Payer: Self-pay | Admitting: Pulmonary Disease

## 2015-04-16 NOTE — Telephone Encounter (Signed)
PFT 12/06/14:  FVC 3.52 L (130%) FEV1 2.81 L (139%) FEV1/FVC 0.80 FEF 25-75 2.60 L (164%) 09/13/10:  FVC 2.49 L (87%) FEV1 1.92 L (89%) FEV1/FVC 0.77 FEF 25-75 1.70 L (88%)  IMAGING CTA CHEST 10/27/14 (personally reviewed by me):  No nodule or opacity appreciated. No pleural effusion or thickening. No pathologic mediastinal adenopathy. No pericardial effusion. No pulmonary emboli.  CARDIAC TTE (09/21/14:  LV normal in size w/ EF 60-65% & normal wall motion. Increased filling pressure noted in LV. LA & RA normal in size. RV normal in size & function. Moderate aortic stenosis. Mild mitral regurg. No pulmonic regurg. No tricuspid regurg. No pericardial effusion.  LABS 11/30/9 IgE:  27.4

## 2015-04-17 ENCOUNTER — Ambulatory Visit (INDEPENDENT_AMBULATORY_CARE_PROVIDER_SITE_OTHER): Payer: Medicare Other | Admitting: Pulmonary Disease

## 2015-04-17 ENCOUNTER — Encounter: Payer: Self-pay | Admitting: Pulmonary Disease

## 2015-04-17 VITALS — BP 136/72 | HR 86 | Ht 63.5 in | Wt 173.4 lb

## 2015-04-17 DIAGNOSIS — K219 Gastro-esophageal reflux disease without esophagitis: Secondary | ICD-10-CM | POA: Diagnosis not present

## 2015-04-17 DIAGNOSIS — J4540 Moderate persistent asthma, uncomplicated: Secondary | ICD-10-CM

## 2015-04-17 MED ORDER — MONTELUKAST SODIUM 5 MG PO CHEW: 5.0000 mg | CHEWABLE_TABLET | Freq: Every day | ORAL | Status: DC

## 2015-04-17 NOTE — Patient Instructions (Signed)
1. Continue taking your Symbicort as you have been. Try brushing her teeth and tongue after using her Symbicort inhaler to see if this helps her reflux. 2. I'm starting you on Singulair to use at night before bed to see if this helps your breathing/asthma during the season changes. 3. We will check breathing tests at her next appointment. 4. I will see you back in 3 months but please call me if have any questions or concerns before then.  TESTS ORDERED: 1. Full pulmonary function testing at next appointment

## 2015-04-17 NOTE — Addendum Note (Signed)
Addended by: Inge Rise on: 04/17/2015 03:22 PM   Modules accepted: Orders

## 2015-04-17 NOTE — Progress Notes (Signed)
Subjective:    Patient ID: Cheryl Hernandez, female    DOB: 01/06/1941, 75 y.o.   MRN: [id number]  C.C.:  Follow-up for Moderate, Persistent Asthma & GERD.  HPI Moderate, Persistent Asthma: Currently prescribed Symbicort. She feels that her symptoms stem from an aspiration years ago that results in a "mucus buildup". Currently she uses her Symbicort to varying degrees sometimes once or twice daily depending on "how her lungs are feeling". She reports more breathing problems during the Spring & Fall. She uses her rescue inhaler rarely. She does feel the Symbicort helps with seasonal symptoms. No wheezing currently. She uses a flutter valve daily and is able to produce <1 tsp daily. No nocturnal awakenings with any coughing. She reports during Christmas she had cough with chills that she treated with some antibiotics she had on hand. Takes Allegra chronically.   GERD: Currently prescribed Prilosec on an as-needed basis. She feels she has more dyspepsia on Symbicort that is also noticed with "greasy & spicy" foods. She denies any morning brash water taste.   Review of Systems No chest pain or pressure. No fever, chills, or sweats.   Allergies  Allergen Reactions  . Cefuroxime Axetil Hives and Itching  . Cephalexin Other (See Comments)    Pt. Doesn't remember reaction  . Codeine     Tolerates Dilaudid.  . Doxycycline     REACTION: vomiting/ GI upset    Current Outpatient Prescriptions on File Prior to Visit  Medication Sig Dispense Refill  . aspirin EC 81 MG tablet Take 162 mg by mouth daily.     . B Complex-C (B-COMPLEX WITH VITAMIN C) tablet Take 1 tablet by mouth 2 (two) times a week.    . budesonide-formoterol (SYMBICORT) 80-4.5 MCG/ACT inhaler Inhale 2 puffs into the lungs 2 (two) times daily. (Patient taking differently: 1 puff 1-2 times a day) 1 Inhaler 5  . calcium carbonate (TUMS - DOSED IN MG ELEMENTAL CALCIUM) 500 MG chewable tablet Chew 1 tablet by mouth 3 (three) times daily  as needed. For heartburn.    . cholecalciferol (VITAMIN D) 1000 UNITS tablet Take 2,000 Units by mouth daily.     . Coenzyme Q10 (CO Q10) 100 MG TABS Take 1 tablet by mouth daily.      . Doxylamine Succinate, Sleep, (UNISOM) 25 MG tablet Take 12.5 mg by mouth at bedtime as needed. For sleep.    . fexofenadine (ALLEGRA) 180 MG tablet Take 180 mg by mouth daily.      . Glucosamine 500 MG CAPS Take 1 capsule by mouth daily.      . Glucose Blood (FREESTYLE LITE TEST VI) Once daily    . hydrochlorothiazide (HYDRODIURIL) 12.5 MG tablet Take 12.5 mg by mouth daily.     . metFORMIN (GLUMETZA) 500 MG (MOD) 24 hr tablet Take 500 mg by mouth daily with breakfast.      . Multiple Vitamins-Minerals (MULTIVITAMIN WITH MINERALS) tablet Take 1 tablet by mouth daily.      . nebivolol (BYSTOLIC) 5 MG tablet Take 1 tablet (5 mg total) by mouth daily. 30 tablet 10  . nitroGLYCERIN (NITROSTAT) 0.4 MG SL tablet Place 1 tablet (0.4 mg total) under the tongue as needed for chest pain. 25 tablet 3  . omeprazole (PRILOSEC) 20 MG capsule take 1 capsule by mouth once daily if needed (Patient taking differently: take 1 capsule by mouth once daily if needed heartburn) 30 capsule 6  . PROAIR HFA 108 (90 BASE) MCG/ACT inhaler inhale  2 puffs by mouth every 4 to 6 hours if needed for wheezing  AND/OR SHORTNESS OF BREATH 8.5 g 4  . Respiratory Therapy Supplies (FLUTTER) DEVI Use flutter valve 1 times daily as needed     No current facility-administered medications on file prior to visit.    Past Medical History  Diagnosis Date  . Asthma   . HTN (hypertension)   . Paroxysmal ventricular tachycardia ([place])   . GERD (gastroesophageal reflux disease)   . Diabetes mellitus ([place])   . Rhinitis   . Hyperlipidemia   . Arthritis   . CAD (coronary artery disease)     a. NSTEMI 2009 s/p DES to RCAx2.  . Diverticulosis   . Chronic diarrhea   . Myocardial infarction ([place])   . Diabetes mellitus without complication ([place])   .  Carotid artery disease ([place])     a. Mild-moderate - followed by [facility].  . Obesity   . Aortic stenosis     a. Mod AS by echo 08/2014.    Past Surgical History  Procedure Laterality Date  . Total abdominal hysterectomy    . Tonsillectomy    . Angioplasty    . Total hip arthroplasty    . Coronary angioplasty with stent placement      Family History  Problem Relation Age of Onset  . Allergies Mother   . Hyperlipidemia Mother   . Prostate cancer Maternal Grandfather   . Heart attack Father   . Lung disease Neg Hx     Social History   Social History  . Marital Status: Widowed    Spouse Name: N/A  . Number of Children: N/A  . Years of Education: N/A   Occupational History  . retired Therapist, sports    Social History Main Topics  . Smoking status: Former Smoker -- 1.00 packs/day for 30 years    Types: Cigarettes    Quit date: 03/31/1998  . Smokeless tobacco: Never Used     Comment: Quit intermittently since her 39s.  . Alcohol Use: 0.0 oz/week    0 Standard drinks or equivalent per week     Comment: Rare.  . Drug Use: No  . Sexual Activity: Not Asked   Other Topics Concern  . None   Social History Narrative   Originally from [place]. She has traveled to [place], [place], [place], & [place]. Previously worked as a Marine scientist. No known TB exposure. No pets currently. No bird, hot tube, or asbestos exposure. Enjoys playing bridge.       Objective:   Physical Exam BP 136/72 mmHg  Pulse 86  Ht 5' 3.5" (1.613 m)  Wt 173 lb 6.4 oz (78.654 kg)  BMI 30.23 kg/m2  SpO2 97% General:  Awake. Alert. No acute distress.  Integument:  Warm & dry. No rash on exposed skin. No bruising. HEENT:  Moist mucus membranes. No oral ulcers. No scleral injection or icterus. No nasal turbinate swelling. Cardiovascular:  Regular rate. No edema. Normal S1 & S2. 3/6 systolic ejection murmur at aortic position. Pulmonary:  Good aeration & clear to auscultation bilaterally. Symmetric chest wall expansion. No accessory muscle use  on room air. Musculoskeletal:  Normal bulk and tone. Hand grip strength 5/5 bilaterally. No joint deformity or effusion appreciated.  PFT 12/06/14: FVC 3.52 L (130%) FEV1 2.81 L (139%) FEV1/FVC 0.80 FEF 25-75 2.60 L (164%) 09/13/10: FVC 2.49 L (87%) FEV1 1.92 L (89%) FEV1/FVC 0.77 FEF 25-75 1.70 L (88%)  IMAGING CTA CHEST 10/27/14 (personally reviewed by me): No  nodule or opacity appreciated. No pleural effusion or thickening. No pathologic mediastinal adenopathy. No pericardial effusion. No pulmonary emboli.  CARDIAC TTE (09/21/14: LV normal in size w/ EF 60-65% & normal wall motion. Increased filling pressure noted in LV. LA & RA normal in size. RV normal in size & function. Moderate aortic stenosis. Mild mitral regurg. No pulmonic regurg. No tricuspid regurg. No pericardial effusion.  LABS 11/30/9 IgE: 27.4    Assessment & Plan:  75 year old female with known history of moderate, persistent asthma that seems to have some seasonal variation. I reviewed the patient's prior pulmonary function testing which shows no evidence for fixed airways obstruction on spirometry. Despite the patient's prior tobacco use she seemed to have more of an asthmatic phenotype. She does seem to have some symptoms from use of inhaled steroids/Symbicort. Otherwise, her reflux seems to be well-controlled at this time. I instructed the patient to contact my office if she has any new breathing problems before next appointment.  1. Moderate, persistent asthma: Continuing patient on Symbicort 80/4.5. Starting Singulair 5 mg by mouth daily at bedtime. Repeat full pulmonary function testing Follow-up appointment and consider further de-escalation of inhaled medication therapy. 2. GERD: Counseled patient on appropriate oral hygiene with inhaled steroid therapy. Continuing Prilosec as prescribed. 3. Health maintenance: Patient reports she previously had Prevnar and influenza vaccines. 4. Follow-up: Patient to return to  clinic in 3 months or sooner if needed.

## 2015-06-08 ENCOUNTER — Other Ambulatory Visit: Payer: Self-pay | Admitting: *Deleted

## 2015-06-08 MED ORDER — NEBIVOLOL HCL 5 MG PO TABS: 5.0000 mg | ORAL_TABLET | Freq: Every day | ORAL | Status: DC

## 2015-07-25 ENCOUNTER — Ambulatory Visit: Payer: Medicare Other | Admitting: Pulmonary Disease

## 2015-08-24 DIAGNOSIS — Z0000 Encounter for general adult medical examination without abnormal findings: Secondary | ICD-10-CM | POA: Insufficient documentation

## 2015-08-31 DIAGNOSIS — D692 Other nonthrombocytopenic purpura: Secondary | ICD-10-CM | POA: Insufficient documentation

## 2015-08-31 DIAGNOSIS — I359 Nonrheumatic aortic valve disorder, unspecified: Secondary | ICD-10-CM | POA: Insufficient documentation

## 2015-08-31 DIAGNOSIS — I209 Angina pectoris, unspecified: Secondary | ICD-10-CM | POA: Insufficient documentation

## 2015-09-06 ENCOUNTER — Encounter: Payer: Self-pay | Admitting: Interventional Cardiology

## 2015-09-19 ENCOUNTER — Ambulatory Visit (INDEPENDENT_AMBULATORY_CARE_PROVIDER_SITE_OTHER): Payer: Medicare Other | Admitting: Pulmonary Disease

## 2015-09-19 ENCOUNTER — Encounter: Payer: Self-pay | Admitting: Pulmonary Disease

## 2015-09-19 VITALS — BP 138/72 | HR 79 | Ht 63.5 in | Wt 168.0 lb

## 2015-09-19 DIAGNOSIS — J4540 Moderate persistent asthma, uncomplicated: Secondary | ICD-10-CM

## 2015-09-19 DIAGNOSIS — K219 Gastro-esophageal reflux disease without esophagitis: Secondary | ICD-10-CM | POA: Diagnosis not present

## 2015-09-19 DIAGNOSIS — J309 Allergic rhinitis, unspecified: Secondary | ICD-10-CM | POA: Diagnosis not present

## 2015-09-19 LAB — PULMONARY FUNCTION TEST
DL/VA % pred: 96 %
DL/VA: 4.54 ml/min/mmHg/L
DLCO COR % PRED: 81 %
DLCO UNC: 18.81 ml/min/mmHg
DLCO cor: 18.7 ml/min/mmHg
DLCO unc % pred: 82 %
FEF 25-75 POST: 1.65 L/s
FEF 25-75 Pre: 1.68 L/sec
FEF2575-%Change-Post: -2 %
FEF2575-%Pred-Post: 109 %
FEF2575-%Pred-Pre: 111 %
FEV1-%Change-Post: 2 %
FEV1-%PRED-POST: 95 %
FEV1-%Pred-Pre: 93 %
FEV1-POST: 1.85 L
FEV1-PRE: 1.82 L
FEV1FVC-%Change-Post: -1 %
FEV1FVC-%Pred-Pre: 109 %
FEV6-%Change-Post: 3 %
FEV6-%PRED-PRE: 90 %
FEV6-%Pred-Post: 93 %
FEV6-POST: 2.32 L
FEV6-Pre: 2.24 L
FEV6FVC-%PRED-POST: 105 %
FEV6FVC-%Pred-Pre: 105 %
FVC-%CHANGE-POST: 3 %
FVC-%PRED-PRE: 85 %
FVC-%Pred-Post: 88 %
FVC-POST: 2.32 L
FVC-PRE: 2.24 L
POST FEV6/FVC RATIO: 100 %
PRE FEV1/FVC RATIO: 81 %
Post FEV1/FVC ratio: 80 %
Pre FEV6/FVC Ratio: 100 %
RV % pred: 90 %
RV: 2.06 L
TLC % PRED: 91 %
TLC: 4.49 L

## 2015-09-19 NOTE — Addendum Note (Signed)
Addended by: Parke Poisson E on: 09/19/2015 11:56 AM   Modules accepted: Orders

## 2015-09-19 NOTE — Progress Notes (Signed)
Subjective:    Patient ID: Cheryl Hernandez, female    DOB: 1940/05/10, 75 y.o.   MRN: [id number]  C.C.:  Follow-up for Moderate, Persistent Asthma, Allergic Rhinitis, & GERD.  HPI Moderate, Persistent Asthma: Started Singulair at last appointment. She reports she has noticed increased dyspnea with high humidity. Denies any coughing or wheezing. She reports she is compliant with Singulair. She reports she is breathing better. No exacerbations since last appointment. No nocturnal awakenings with breathing problems.  She isn't needing her rescue inhaler.   Allergic Rhinitis:  Does still have intermittent congestion, drainage, & pressure.  GERD: On Prilosec but isn't taking it daily. Still having reflux & dyspepsia daily depending on her diet.   Review of Systems No fever, chills, or sweats. Intermittent chest tightness. No chest pain or pressure.    Allergies  Allergen Reactions  . Cefuroxime Axetil Hives and Itching  . Cephalexin Other (See Comments)    Pt. Doesn't remember reaction  . Codeine     Tolerates Dilaudid.  . Doxycycline     REACTION: vomiting/ GI upset    Current Outpatient Prescriptions on File Prior to Visit  Medication Sig Dispense Refill  . aspirin EC 81 MG tablet Take 162 mg by mouth daily.     . B Complex-C (B-COMPLEX WITH VITAMIN C) tablet Take 1 tablet by mouth 2 (two) times a week.    . budesonide-formoterol (SYMBICORT) 80-4.5 MCG/ACT inhaler Inhale 2 puffs into the lungs 2 (two) times daily. (Patient taking differently: 1 puff 1-2 times a day) 1 Inhaler 5  . calcium carbonate (TUMS - DOSED IN MG ELEMENTAL CALCIUM) 500 MG chewable tablet Chew 1 tablet by mouth 3 (three) times daily as needed. For heartburn.    . cholecalciferol (VITAMIN D) 1000 UNITS tablet Take 2,000 Units by mouth daily.     . Coenzyme Q10 (CO Q10) 100 MG TABS Take 1 tablet by mouth daily.      . Doxylamine Succinate, Sleep, (UNISOM) 25 MG tablet Take 12.5 mg by mouth at bedtime as needed.  For sleep.    . fexofenadine (ALLEGRA) 180 MG tablet Take 180 mg by mouth daily.      . Glucose Blood (FREESTYLE LITE TEST VI) Once daily    . hydrochlorothiazide (HYDRODIURIL) 12.5 MG tablet Take 12.5 mg by mouth daily.     . metFORMIN (GLUMETZA) 500 MG (MOD) 24 hr tablet Take 500 mg by mouth daily with breakfast.      . montelukast (SINGULAIR) 5 MG chewable tablet Chew 1 tablet (5 mg total) by mouth at bedtime. 90 tablet 3  . Multiple Vitamins-Minerals (MULTIVITAMIN WITH MINERALS) tablet Take 1 tablet by mouth daily.      . nebivolol (BYSTOLIC) 5 MG tablet Take 1 tablet (5 mg total) by mouth daily. 30 tablet 10  . nitroGLYCERIN (NITROSTAT) 0.4 MG SL tablet Place 1 tablet (0.4 mg total) under the tongue as needed for chest pain. 25 tablet 3  . omeprazole (PRILOSEC) 20 MG capsule take 1 capsule by mouth once daily if needed (Patient taking differently: take 1 capsule by mouth once daily if needed heartburn) 30 capsule 6  . PROAIR HFA 108 (90 BASE) MCG/ACT inhaler inhale 2 puffs by mouth every 4 to 6 hours if needed for wheezing  AND/OR SHORTNESS OF BREATH 8.5 g 4  . Respiratory Therapy Supplies (FLUTTER) DEVI Use flutter valve 1 times daily as needed     No current facility-administered medications on file prior to visit.  Past Medical History  Diagnosis Date  . Asthma   . HTN (hypertension)   . Paroxysmal ventricular tachycardia ([place])   . GERD (gastroesophageal reflux disease)   . Diabetes mellitus ([place])   . Rhinitis   . Hyperlipidemia   . Arthritis   . CAD (coronary artery disease)     a. NSTEMI 2009 s/p DES to RCAx2.  . Diverticulosis   . Chronic diarrhea   . Myocardial infarction ([place])   . Diabetes mellitus without complication ([place])   . Carotid artery disease ([place])     a. Mild-moderate - followed by [facility].  . Obesity   . Aortic stenosis     a. Mod AS by echo 08/2014.    Past Surgical History  Procedure Laterality Date  . Total abdominal hysterectomy    . Tonsillectomy      . Angioplasty    . Total hip arthroplasty    . Coronary angioplasty with stent placement      Family History  Problem Relation Age of Onset  . Allergies Mother   . Hyperlipidemia Mother   . Prostate cancer Maternal Grandfather   . Heart attack Father   . Lung disease Neg Hx     Social History   Social History  . Marital Status: Widowed    Spouse Name: N/A  . Number of Children: N/A  . Years of Education: N/A   Occupational History  . retired Therapist, sports    Social History Main Topics  . Smoking status: Former Smoker -- 1.00 packs/day for 30 years    Types: Cigarettes    Quit date: 03/31/1998  . Smokeless tobacco: Never Used     Comment: Quit intermittently since her 73s.  . Alcohol Use: 0.0 oz/week    0 Standard drinks or equivalent per week     Comment: Rare.  . Drug Use: No  . Sexual Activity: Not Asked   Other Topics Concern  . None   Social History Narrative   Originally from [place]. She has traveled to [place], [place], [place], & [place]. Previously worked as a Marine scientist. No known TB exposure. No pets currently. No bird, hot tube, or asbestos exposure. Enjoys playing bridge.       Objective:   Physical Exam BP 138/72 mmHg  Pulse 79  Ht 5' 3.5" (1.613 m)  Wt 168 lb (76.204 kg)  BMI 29.29 kg/m2  SpO2 96% General:  Awake. Alert. No distress.  Integument:  Warm & dry. No rash on exposed skin.  HEENT:  Moist mucus membranes. No oral ulcers. Minimal nasal turbinate swelling. Cardiovascular:  Regular rate. No edema. 3/6 systolic ejection murmur at aortic position unchanged. Pulmonary:  Clear bilaterally to auscultation. Normal work of breathing on room air. Speaking in complete sentences. Musculoskeletal:  Normal bulk and tone. No joint deformity or effusion appreciated.  PFT 09/19/15: FVC 2.24 L (85%) FEV1 1.82 L (93%) FEV1/FVC 0.81 FEF 25-75 1.68 L (111%) no bronchodilator response TLC 4.49 L (91%) RV 90% ERV 27% DLCO uncorrected 81% (hemoglobin 13.6) 12/06/14:FVC 3.52 L (130%)  FEV1 2.81 L (139%) FEV1/FVC 0.80 FEF 25-75 2.60 L (164%) 09/13/10:FVC 2.49 L (87%) FEV1 1.92 L (89%) FEV1/FVC 0.77 FEF 25-75 1.70 L (88%)  IMAGING CTA CHEST 10/27/14 (previously reviewed by me): No nodule or opacity appreciated. No pleural effusion or thickening. No pathologic mediastinal adenopathy. No pericardial effusion. No pulmonary emboli.  CARDIAC TTE (09/21/14): LV normal in size w/ EF 60-65% & normal wall motion. Increased filling pressure noted in LV. LA &  RA normal in size. RV normal in size & function. Moderate aortic stenosis. Mild mitral regurg. No pulmonic regurg. No tricuspid regurg. No pericardial effusion.  LABS 11/30/9 IgE: 27.4    Assessment & Plan:  75 year old female with known history of moderate, persistent asthma. Patient's spirometry shows significant worsening since 2012. There is no fixed airway obstruction. Lung volumes and carbon monoxide diffusion capacity are normal. I suspect this greater than physiologic decline is due to her history of recurrent infections/exacerbations. Symptomatically the patient is well controlled right now on her regimen therefore I am not the escalating therapy pending repeat pulmonary function testing at her follow-up appointment. I did caution the patient that uncontrolled silent laryngo-esophageal reflux could worsen her underlying asthma and she should be mindful of her symptoms. Her allergic rhinitis seems to be reasonably controlled at this time. I instructed the patient contact my office if she had any new breathing problems or questions before next appointment.  1. Moderate, persistent asthma: Continuing Symbicort 80/4.5 & Singulair 5 mg by mouth daily at bedtime. Repeat spirometry with bronchodilator challenge & DLCO at next appointment. 2. Allergic Rhinitis: Continuing Singulair. No changes at this time. 3. GERD: Patient advised to try Zantac over-the-counter as needed at night to help with her reflux. 4. Health maintenance:  Status post influenza vaccine October 2016, Prevnar June 2015, & Pneumovax October 2008. 5. Follow-up: Patient to return to clinic in 6 months or sooner if needed.  Sonia Baller Ashok Cordia, M.D. [facility] Pulmonary & Critical Care Pager:  [phone] After 3pm or if no response, call [phone] 11:48 AM 09/19/2015

## 2015-09-19 NOTE — Progress Notes (Signed)
PFT done today. 

## 2015-09-19 NOTE — Patient Instructions (Signed)
   Continue using your Singulair & Symbicort as prescribed.  Call me if you have any new breathing problems or questions before your next appointment.  I will see back in 6 months or sooner if needed.  TESTS ORDERED: 1. Spirometry with bronchodilator challenge at follow-up appointment

## 2015-10-23 ENCOUNTER — Telehealth: Payer: Self-pay | Admitting: Physician Assistant

## 2015-10-23 NOTE — Telephone Encounter (Signed)
error 

## 2015-12-20 ENCOUNTER — Encounter: Payer: Self-pay | Admitting: Interventional Cardiology

## 2016-01-06 NOTE — Progress Notes (Addendum)
Cardiology Office Note    Date:  01/07/2016   ID:  Shamiah, Rinn [birth year], MRN [id number]  PCP:  Tivis Ringer, MD  Cardiologist: Sinclair Grooms, MD   Chief Complaint  Patient presents with  . Coronary Artery Disease    History of Present Illness:  Cheryl Hernandez is a 75 y.o. female who presents for CAD, DES RCA 2 2009, paroxysmal palpitations, hypertension, hyperlipidemia, moderate aortic stenosis, and diabetes mellitus type 2.  She is generally feeling better now than earlier this summer when she had pretty extensive cardiac workup. No recurrence of chest discomfort or dyspnea. She has not needed to use nitroglycerin. She has been active in a yard without difficulty. No episodes of syncope or palpitation.    Past Medical History:  Diagnosis Date  . Aortic stenosis    a. Mod AS by echo 08/2014.  . Arthritis   . Asthma   . CAD (coronary artery disease)    a. NSTEMI 2009 s/p DES to RCAx2.  . Carotid artery disease ([place])    a. Mild-moderate - followed by [facility].  . Chronic diarrhea   . Diabetes mellitus ([place])   . Diabetes mellitus without complication ([place])   . Diverticulosis   . GERD (gastroesophageal reflux disease)   . HTN (hypertension)   . Hyperlipidemia   . Myocardial infarction   . Obesity   . Paroxysmal ventricular tachycardia ([place])   . Rhinitis     Past Surgical History:  Procedure Laterality Date  . ANGIOPLASTY    . CORONARY ANGIOPLASTY WITH STENT PLACEMENT    . TONSILLECTOMY    . TOTAL ABDOMINAL HYSTERECTOMY    . TOTAL HIP ARTHROPLASTY      Current Medications: Outpatient Medications Prior to Visit  Medication Sig Dispense Refill  . ALPRAZolam (XANAX) 0.5 MG tablet Take 1 tablet by mouth as needed. At bedtime  0  . aspirin EC 81 MG tablet Take 162 mg by mouth daily.     . B Complex-C (B-COMPLEX WITH VITAMIN C) tablet Take 1 tablet by mouth 2 (two) times a week.    . budesonide-formoterol (SYMBICORT) 80-4.5 MCG/ACT inhaler Inhale  2 puffs into the lungs 2 (two) times daily. (Patient taking differently: 1 puff 1-2 times a day) 1 Inhaler 5  . calcium carbonate (TUMS - DOSED IN MG ELEMENTAL CALCIUM) 500 MG chewable tablet Chew 1 tablet by mouth 3 (three) times daily as needed. For heartburn.    . cholecalciferol (VITAMIN D) 1000 UNITS tablet Take 2,000 Units by mouth daily.     . Coenzyme Q10 (CO Q10) 100 MG TABS Take 1 tablet by mouth daily.      . Doxylamine Succinate, Sleep, (UNISOM) 25 MG tablet Take 12.5 mg by mouth at bedtime as needed. For sleep.    . fexofenadine (ALLEGRA) 180 MG tablet Take 180 mg by mouth daily.      . Glucose Blood (FREESTYLE LITE TEST VI) Once daily    . hydrochlorothiazide (HYDRODIURIL) 12.5 MG tablet Take 12.5 mg by mouth daily.     . metFORMIN (GLUMETZA) 500 MG (MOD) 24 hr tablet Take 500 mg by mouth daily with breakfast.      . montelukast (SINGULAIR) 5 MG chewable tablet Chew 1 tablet (5 mg total) by mouth at bedtime. 90 tablet 3  . Multiple Vitamins-Minerals (MULTIVITAMIN WITH MINERALS) tablet Take 1 tablet by mouth daily.      . nebivolol (BYSTOLIC) 5 MG tablet Take 1 tablet (5 mg total)  by mouth daily. 30 tablet 10  . nitroGLYCERIN (NITROSTAT) 0.4 MG SL tablet Place 1 tablet (0.4 mg total) under the tongue as needed for chest pain. 25 tablet 3  . omeprazole (PRILOSEC) 20 MG capsule take 1 capsule by mouth once daily if needed (Patient taking differently: take 1 capsule by mouth once daily if needed heartburn) 30 capsule 6  . PROAIR HFA 108 (90 BASE) MCG/ACT inhaler inhale 2 puffs by mouth every 4 to 6 hours if needed for wheezing  AND/OR SHORTNESS OF BREATH 8.5 g 4  . Respiratory Therapy Supplies (FLUTTER) DEVI Use flutter valve 1 times daily as needed     No facility-administered medications prior to visit.      Allergies:   Cefuroxime axetil; Cephalexin; Codeine; Doxycycline; and Erythromycin base   Social History   Social History  . Marital status: Widowed    Spouse name: N/A  .  Number of children: N/A  . Years of education: N/A   Occupational History  . retired Therapist, sports    Social History Main Topics  . Smoking status: Former Smoker    Packs/day: 1.00    Years: 30.00    Types: Cigarettes    Quit date: 03/31/1998  . Smokeless tobacco: Never Used     Comment: Quit intermittently since her 44s.  . Alcohol use 0.0 oz/week     Comment: Rare.  . Drug use: No  . Sexual activity: Not Asked   Other Topics Concern  . None   Social History Narrative   Originally from [place]. She has traveled to [place], [place], [place], & [place]. Previously worked as a Marine scientist. No known TB exposure. No pets currently. No bird, hot tube, or asbestos exposure. Enjoys playing bridge.      Family History:  The patient's family history includes Allergies in her mother; Heart attack in her father; Hyperlipidemia in her mother; Prostate cancer in her maternal grandfather.   ROS:   Please see the history of present illness.    Episodes of dyspnea have resolved. She denies syncope, chest pain, and orthopnea. There is some ankle and right foot swelling intermittently.  All other systems reviewed and are negative.   PHYSICAL EXAM:   VS:  BP (!) 154/74   Pulse 83   Ht 5' 3.5" (1.613 m)   Wt 166 lb (75.3 kg)   BMI 28.94 kg/m    GEN: Well nourished, well developed, in no acute distress  HEENT: normal  Neck: no JVD, carotid bruits, or masses Cardiac: RRR; 3/6 systolic murmur. No rubs, or gallops,no edema.  Respiratory:  clear to auscultation bilaterally, normal work of breathing GI: soft, nontender, nondistended, + BS MS: no deformity or atrophy  Skin: warm and dry, no rash Neuro:  Alert and Oriented x 3, Strength and sensation are intact Psych: euthymic mood, full affect  Wt Readings from Last 3 Encounters:  01/07/16 166 lb (75.3 kg)  09/19/15 168 lb (76.2 kg)  04/17/15 173 lb 6.4 oz (78.7 kg)      Studies/Labs Reviewed:   EKG:  EKG  Normal sinus rhythm at 84 bpm. Nonspecific ST abnormality.  Left atrial abnormality. No change compared to prior.  Recent Labs: No results found for requested labs within last 8760 hours.   Lipid Panel    Component Value Date/Time   CHOL (H) 12/11/2007 0350    302        ATP III CLASSIFICATION:  <200     mg/dL   Desirable  200-239  mg/dL   Borderline High  >=240    mg/dL   High   TRIG 158 (H) 12/11/2007 0350   HDL 48 12/11/2007 0350   CHOLHDL 6.3 12/11/2007 0350   VLDL 32 12/11/2007 0350   LDLCALC (H) 12/11/2007 0350    222        Total Cholesterol/HDL:CHD Risk Coronary Heart Disease Risk Table                     Men   Women  1/2 Average Risk   3.4   3.3    Additional studies/ records that were reviewed today include:   Echocardiogram 2016:  Study Conclusions  - Left ventricle: The cavity size was normal. Wall thickness was   normal. Systolic function was normal. The estimated ejection   fraction was in the range of 60% to 65%. Wall motion was normal;   there were no regional wall motion abnormalities. - Aortic valve: Mildly calcified annulus. Moderately thickened,   moderately calcified leaflets. There was moderate stenosis. - Mitral valve: There was mild regurgitation.   ASSESSMENT:    1. CAD in native artery   2. Nonrheumatic aortic valve stenosis   3. Essential hypertension   4. Mixed hyperlipidemia      PLAN:  In order of problems listed above:  1. Stable without angina. Encouraged aerobic activity. 2. Moderate aortic stenosis based upon echocardiogram from 2016. Repeat the echo in one year prior to the office visit. This will be for surveillance to determine progression. ECG suggests possible LVH. 3. Low-salt diet. Monitor blood pressure at home. Target 140/90 mmHg or less. Call if consistently elevated. 4. Low-fat diet. LDL target 70.    Medication Adjustments/Labs and Tests Ordered: Current medicines are reviewed at length with the patient today.  Concerns regarding medicines are outlined above.   Medication changes, Labs and Tests ordered today are listed in the Patient Instructions below. Patient Instructions  Medication Instructions:  None  Labwork: None  Testing/Procedures: Your physician has requested that you have an echocardiogram prior to your follow up visit next year. Echocardiography is a painless test that uses sound waves to create images of your heart. It provides your doctor with information about the size and shape of your heart and how well your heart's chambers and valves are working. This procedure takes approximately one hour. There are no restrictions for this procedure.    Follow-Up: Your physician wants you to follow-up in: 1 year with Dr. [name]. You will receive a reminder letter in the mail two months in advance. If you don't receive a letter, please call our office to schedule the follow-up appointment.   Any Other Special Instructions Will Be Listed Below (If Applicable).  Monitor your blood pressure at home.  If your blood pressure is consistently 140/90 or higher, please call our office.   If you need a refill on your cardiac medications before your next appointment, please call your pharmacy.      Signed, Sinclair Grooms, MD  01/07/2016 11:31 AM    [facility] [place], [place], [place]  [address] Phone: [phone]; Fax: [phone]

## 2016-01-07 ENCOUNTER — Encounter (INDEPENDENT_AMBULATORY_CARE_PROVIDER_SITE_OTHER): Payer: Self-pay

## 2016-01-07 ENCOUNTER — Encounter: Payer: Self-pay | Admitting: Interventional Cardiology

## 2016-01-07 ENCOUNTER — Ambulatory Visit (INDEPENDENT_AMBULATORY_CARE_PROVIDER_SITE_OTHER): Payer: Medicare Other | Admitting: Interventional Cardiology

## 2016-01-07 VITALS — BP 154/74 | HR 83 | Ht 63.5 in | Wt 166.0 lb

## 2016-01-07 DIAGNOSIS — E782 Mixed hyperlipidemia: Secondary | ICD-10-CM

## 2016-01-07 DIAGNOSIS — I2510 Atherosclerotic heart disease of native coronary artery without angina pectoris: Secondary | ICD-10-CM

## 2016-01-07 DIAGNOSIS — I350 Nonrheumatic aortic (valve) stenosis: Secondary | ICD-10-CM | POA: Diagnosis not present

## 2016-01-07 DIAGNOSIS — I10 Essential (primary) hypertension: Secondary | ICD-10-CM

## 2016-01-07 NOTE — Patient Instructions (Addendum)
Medication Instructions:  None  Labwork: None  Testing/Procedures: Your physician has requested that you have an echocardiogram prior to your follow up visit next year. Echocardiography is a painless test that uses sound waves to create images of your heart. It provides your doctor with information about the size and shape of your heart and how well your heart's chambers and valves are working. This procedure takes approximately one hour. There are no restrictions for this procedure.    Follow-Up: Your physician wants you to follow-up in: 1 year with Dr. [name]. You will receive a reminder letter in the mail two months in advance. If you don't receive a letter, please call our office to schedule the follow-up appointment.   Any Other Special Instructions Will Be Listed Below (If Applicable).  Monitor your blood pressure at home.  If your blood pressure is consistently 140/90 or higher, please call our office.   If you need a refill on your cardiac medications before your next appointment, please call your pharmacy.

## 2016-01-07 NOTE — Addendum Note (Signed)
Addended by: Loren Racer on: 01/07/2016 04:58 PM   Modules accepted: Orders

## 2016-05-14 ENCOUNTER — Other Ambulatory Visit: Payer: Self-pay | Admitting: Adult Health

## 2016-06-15 ENCOUNTER — Other Ambulatory Visit: Payer: Self-pay | Admitting: Pulmonary Disease

## 2016-07-18 ENCOUNTER — Other Ambulatory Visit: Payer: Self-pay | Admitting: Interventional Cardiology

## 2016-07-24 ENCOUNTER — Other Ambulatory Visit: Payer: Self-pay | Admitting: Interventional Cardiology

## 2016-09-16 ENCOUNTER — Encounter (HOSPITAL_COMMUNITY): Payer: Medicare Other

## 2016-09-16 ENCOUNTER — Ambulatory Visit: Payer: Medicare Other | Admitting: Vascular Surgery

## 2016-09-18 ENCOUNTER — Encounter: Payer: Self-pay | Admitting: Pulmonary Disease

## 2016-09-18 ENCOUNTER — Ambulatory Visit (INDEPENDENT_AMBULATORY_CARE_PROVIDER_SITE_OTHER): Payer: Medicare Other | Admitting: Pulmonary Disease

## 2016-09-18 VITALS — BP 138/62 | HR 87 | Ht 63.5 in | Wt 158.8 lb

## 2016-09-18 DIAGNOSIS — J4540 Moderate persistent asthma, uncomplicated: Secondary | ICD-10-CM

## 2016-09-18 DIAGNOSIS — J309 Allergic rhinitis, unspecified: Secondary | ICD-10-CM

## 2016-09-18 DIAGNOSIS — K219 Gastro-esophageal reflux disease without esophagitis: Secondary | ICD-10-CM | POA: Diagnosis not present

## 2016-09-18 MED ORDER — BUDESONIDE-FORMOTEROL FUMARATE 80-4.5 MCG/ACT IN AERO: 2.0000 | INHALATION_SPRAY | Freq: Two times a day (BID) | RESPIRATORY_TRACT | 11 refills | Status: DC

## 2016-09-18 NOTE — Patient Instructions (Signed)
   Continue taking your inhaler and other medications as prescribed.  Call me if you have any new breathing problems before your next appointment.  I will see you back in 1 year or sooner if needed.  TESTS ORDERED: 1. Spirometry with bronchodilator challenge at next appointment

## 2016-09-18 NOTE — Progress Notes (Signed)
Subjective:    Patient ID: Cheryl Hernandez, female    DOB: 06-22-1940, 76 y.o.   MRN: [id number]  C.C.:  Follow-up for Moderate, Persistent Asthma, Chronic Allergic Rhinitis, & GERD.  HPI Moderate, persistent asthma: Prescribed Symbicort 80/4.5 & Singulair 5 mg by mouth daily at bedtime. She reports her baseline dyspnea. Mild, intermittent cough. She is producing a "clear mucus". She reports she hasn't needed her rescue inhaler in some time. No exacerbations since last appointment. Rare, intermittent wheezing except with exposure to excessive heat & humidity. She is using her flutter valve.   Chronic allergic rhinitis: Previously prescribed Singulair. No sinus congestion or drainage.   GERD: Previously patient was not taking Prilosec daily. Recommended trying Zantac over-the-counter at last appointment. She is using Tums daily. She admits her diet precipitates her symptoms.   Review of Systems No chronic chest pain, tightness, or pressure. No fever or chills. No rashes or bruising.    Allergies  Allergen Reactions  . Cefuroxime Axetil Hives and Itching  . Cephalexin Other (See Comments)    Pt. Doesn't remember reaction  . Codeine     Tolerates Dilaudid.  . Doxycycline     REACTION: vomiting/ GI upset  . Erythromycin Base Other (See Comments)    unknown    Current Outpatient Prescriptions on File Prior to Visit  Medication Sig Dispense Refill  . ALPRAZolam (XANAX) 0.5 MG tablet Take 1 tablet by mouth as needed. At bedtime  0  . aspirin EC 81 MG tablet Take 162 mg by mouth daily.     . B Complex-C (B-COMPLEX WITH VITAMIN C) tablet Take 1 tablet by mouth 2 (two) times a week.    Marland Kitchen BYSTOLIC 5 MG tablet take 1 tablet by mouth once daily 30 tablet 10  . calcium carbonate (TUMS - DOSED IN MG ELEMENTAL CALCIUM) 500 MG chewable tablet Chew 1 tablet by mouth 3 (three) times daily as needed. For heartburn.    . cholecalciferol (VITAMIN D) 1000 UNITS tablet Take 2,000 Units by mouth daily.      . Coenzyme Q10 (CO Q10) 100 MG TABS Take 1 tablet by mouth daily.      . Doxylamine Succinate, Sleep, (UNISOM) 25 MG tablet Take 12.5 mg by mouth at bedtime as needed. For sleep.    . fexofenadine (ALLEGRA) 180 MG tablet Take 180 mg by mouth daily.      . Glucose Blood (FREESTYLE LITE TEST VI) Once daily    . hydrochlorothiazide (HYDRODIURIL) 12.5 MG tablet Take 12.5 mg by mouth daily.     . metFORMIN (GLUMETZA) 500 MG (MOD) 24 hr tablet Take 500 mg by mouth daily with breakfast.      . montelukast (SINGULAIR) 5 MG chewable tablet chew and swallow 1 tablet by mouth at bedtime 30 tablet 3  . Multiple Vitamins-Minerals (MULTIVITAMIN WITH MINERALS) tablet Take 1 tablet by mouth daily.      . nitroGLYCERIN (NITROSTAT) 0.4 MG SL tablet Place 1 tablet (0.4 mg total) under the tongue as needed for chest pain. 25 tablet 3  . omeprazole (PRILOSEC) 20 MG capsule take 1 capsule by mouth once daily if needed (Patient taking differently: take 1 capsule by mouth once daily if needed heartburn) 30 capsule 6  . PROAIR HFA 108 (90 BASE) MCG/ACT inhaler inhale 2 puffs by mouth every 4 to 6 hours if needed for wheezing  AND/OR SHORTNESS OF BREATH 8.5 g 4  . Respiratory Therapy Supplies (FLUTTER) DEVI Use flutter valve 1 times  daily as needed    . SYMBICORT 80-4.5 MCG/ACT inhaler inhale 2 puffs by mouth INTO THE LUNGS twice a day 10.2 g 0   No current facility-administered medications on file prior to visit.     Past Medical History:  Diagnosis Date  . Aortic stenosis    a. Mod AS by echo 08/2014.  . Arthritis   . Asthma   . CAD (coronary artery disease)    a. NSTEMI 2009 s/p DES to RCAx2.  . Carotid artery disease ([place])    a. Mild-moderate - followed by [facility].  . Chronic diarrhea   . Diabetes mellitus ([place])   . Diabetes mellitus without complication ([place])   . Diverticulosis   . GERD (gastroesophageal reflux disease)   . HTN (hypertension)   . Hyperlipidemia   . Myocardial infarction ([place])   .  Obesity   . Paroxysmal ventricular tachycardia ([place])   . Rhinitis     Past Surgical History:  Procedure Laterality Date  . ANGIOPLASTY    . CORONARY ANGIOPLASTY WITH STENT PLACEMENT    . TONSILLECTOMY    . TOTAL ABDOMINAL HYSTERECTOMY    . TOTAL HIP ARTHROPLASTY      Family History  Problem Relation Age of Onset  . Allergies Mother   . Hyperlipidemia Mother   . Prostate cancer Maternal Grandfather   . Heart attack Father   . Lung disease Neg Hx     Social History   Social History  . Marital status: Widowed    Spouse name: N/A  . Number of children: N/A  . Years of education: N/A   Occupational History  . retired Therapist, sports    Social History Main Topics  . Smoking status: Former Smoker    Packs/day: 1.00    Years: 30.00    Types: Cigarettes    Quit date: 03/31/1998  . Smokeless tobacco: Never Used     Comment: Quit intermittently since her 53s.  . Alcohol use 0.0 oz/week     Comment: Rare.  . Drug use: No  . Sexual activity: Not Asked   Other Topics Concern  . None   Social History Narrative   Originally from [place]. She has traveled to [place], [place], [place], & [place]. Previously worked as a Marine scientist. No known TB exposure. No pets currently. No bird, hot tube, or asbestos exposure. Enjoys playing bridge.       Objective:   Physical Exam BP 138/62 (BP Location: Right Arm, Patient Position: Sitting, Cuff Size: Normal)   Pulse 87   Ht 5' 3.5" (1.613 m)   Wt 158 lb 12.8 oz (72 kg)   SpO2 94%   BMI 27.69 kg/m   General:  Awake. Alert. No acute distress.  Integument:  Warm & dry. No rash on exposed skin.  Extremities:  No cyanosis or clubbing.  HEENT:  Moist mucus membranes. No significant nasal turbinate swelling. No oral ulcers. Cardiovascular:  Regular rate. No edema. 3/6 systolic ejection murmur unchanged.  Pulmonary:  Good aeration & clear to auscultation bilaterally. Symmetric chest wall expansion. No accessory muscle use on room air. Abdomen: Soft. Normal bowel sounds.  Nondistended.  Musculoskeletal:  Normal bulk and tone. No joint deformity or effusion appreciated.  PFT 09/19/15: FVC 2.24 L (85%) FEV1 1.82 L (93%) FEV1/FVC 0.81 FEF 25-75 1.68 L (111%) no bronchodilator response TLC 4.49 L (91%) RV 90% ERV 27% DLCO uncorrected 81% (hemoglobin 13.6) 12/06/14:FVC 3.52 L (130%) +321FEV1 2.81 L (139%) FEV1/FVC 0.80 FEF 25-75 2.60 L (164%) 09/13/10:FVC  2.49 L (87%) FEV1 1.92 L (89%) FEV1/FVC 0.77 FEF 25-75 1.70 L (88%)  IMAGING CTA CHEST 10/27/14 (previously reviewed by me): No nodule or opacity appreciated. No pleural effusion or thickening. No pathologic mediastinal adenopathy. No pericardial effusion. No pulmonary emboli.  CARDIAC TTE (09/21/14): LV normal in size w/ EF 60-65% & normal wall motion. Increased filling pressure noted in LV. LA & RA normal in size. RV normal in size & function. Moderate aortic stenosis. Mild mitral regurg. No pulmonic regurg. No tricuspid regurg. No pericardial effusion.  LABS 11/30/9 IgE: 27.4    Assessment & Plan:  76 y.o. female with known moderate, persistent asthma, chronic allergic rhinitis, & GERD. Symptomatically the patient's asthma seems to be well-controlled. Also, her allergic rhinitis is well controlled. We did discuss her reflux which seems to be an ongoing issue depending upon her diet. The patient is reluctant to use daily medication for reflux control. I instructed the patient to notify me if she had any new breathing problems before next appointment as I would be happy to see her sooner.  1. Moderate, persistent asthma: Continuing Symbicort 80/4.5 & Singulair. No changes. Repeat spirometry with bronchodilator challenge at next appointment. 2. Chronic allergic rhinitis: Continuing Singulair. No changes. 3. GERD: Patient to continue dietary modification and medications as previously mentioned. 4. Health maintenance: Status post influenza vaccine October 2016, Prevnar June 2015, & Pneumovax October 2008.  Recommended influenza vaccine in the fall. 5. Follow-up: Return to clinic in 1 year or sooner if needed.  Sonia Baller Ashok Cordia, M.D. [facility] Pager:  [phone] After 3pm or if no response, call [phone] 1:44 PM 09/18/16

## 2016-10-23 ENCOUNTER — Other Ambulatory Visit: Payer: Self-pay | Admitting: Internal Medicine

## 2016-11-18 ENCOUNTER — Ambulatory Visit: Payer: Medicare Other | Admitting: Vascular Surgery

## 2016-11-18 ENCOUNTER — Encounter (HOSPITAL_COMMUNITY): Payer: Medicare Other

## 2016-11-27 ENCOUNTER — Other Ambulatory Visit: Payer: Self-pay

## 2016-11-27 DIAGNOSIS — I6522 Occlusion and stenosis of left carotid artery: Secondary | ICD-10-CM

## 2016-12-09 ENCOUNTER — Ambulatory Visit (INDEPENDENT_AMBULATORY_CARE_PROVIDER_SITE_OTHER): Payer: Medicare Other | Admitting: Vascular Surgery

## 2016-12-09 ENCOUNTER — Encounter: Payer: Self-pay | Admitting: Vascular Surgery

## 2016-12-09 ENCOUNTER — Ambulatory Visit (HOSPITAL_COMMUNITY)
Admission: RE | Admit: 2016-12-09 | Discharge: 2016-12-09 | Disposition: A | Discharge status: Home / Self Care | Payer: Medicare Other | Source: Ambulatory Visit | Attending: Vascular Surgery | Admitting: Vascular Surgery

## 2016-12-09 VITALS — BP 147/78 | HR 79 | Temp 97.2°F | Resp 17 | Ht 63.5 in | Wt 156.0 lb

## 2016-12-09 DIAGNOSIS — I6523 Occlusion and stenosis of bilateral carotid arteries: Secondary | ICD-10-CM | POA: Diagnosis not present

## 2016-12-09 DIAGNOSIS — I6522 Occlusion and stenosis of left carotid artery: Secondary | ICD-10-CM | POA: Insufficient documentation

## 2016-12-09 NOTE — Progress Notes (Signed)
Vascular and Vein Specialist of [facility]  Patient name: Cheryl Hernandez MRN: [id number] DOB: March 16, 1941 Sex: female  REASON FOR VISIT: Follow-up of asymptomatic carotid disease  HPI: Cheryl Hernandez is a 76 y.o. female here today for a duplex follow-up of her asymptomatic carotid disease. She denies any neurologic deficits. Specifically no amaurosis fugax, transient ischemic attack or stroke. Her main complaint is of a bilateral rotator cuff tear which is causing her some limitation but she is deferring surgery at this time. She does have a recent discovery of progressive cardiac murmur is scheduled for follow-up echo cardiogram in the next 1-2 weeks.  Past Medical History:  Diagnosis Date  . Aortic stenosis    a. Mod AS by echo 08/2014.  . Arthritis   . Asthma   . CAD (coronary artery disease)    a. NSTEMI 2009 s/p DES to RCAx2.  . Carotid artery disease ([place])    a. Mild-moderate - followed by [facility].  . Chronic diarrhea   . Diabetes mellitus ([place])   . Diabetes mellitus without complication ([place])   . Diverticulosis   . GERD (gastroesophageal reflux disease)   . HTN (hypertension)   . Hyperlipidemia   . Myocardial infarction ([place])   . Obesity   . Paroxysmal ventricular tachycardia ([place])   . Rhinitis   . Rotator cuff tear     Family History  Problem Relation Age of Onset  . Allergies Mother   . Hyperlipidemia Mother   . Prostate cancer Maternal Grandfather   . Heart attack Father   . Lung disease Neg Hx     SOCIAL HISTORY: Social History  Substance Use Topics  . Smoking status: Former Smoker    Packs/day: 1.00    Years: 30.00    Types: Cigarettes    Quit date: 03/31/1998  . Smokeless tobacco: Never Used     Comment: Quit intermittently since her 29s.  . Alcohol use 0.0 oz/week     Comment: Rare.    Allergies  Allergen Reactions  . Cefuroxime Axetil Hives and Itching  . Cephalexin Other (See Comments)    Pt. Doesn't  remember reaction  . Codeine     Tolerates Dilaudid.  . Doxycycline     REACTION: vomiting/ GI upset  . Erythromycin Base Other (See Comments)    unknown    Current Outpatient Prescriptions  Medication Sig Dispense Refill  . ALPRAZolam (XANAX) 0.5 MG tablet Take 1 tablet by mouth as needed. At bedtime  0  . aspirin EC 81 MG tablet Take 162 mg by mouth daily.     . B Complex-C (B-COMPLEX WITH VITAMIN C) tablet Take 1 tablet by mouth 2 (two) times a week.    . budesonide-formoterol (SYMBICORT) 80-4.5 MCG/ACT inhaler Inhale 2 puffs into the lungs 2 (two) times daily. 1 Inhaler 11  . BYSTOLIC 5 MG tablet take 1 tablet by mouth once daily 30 tablet 10  . calcium carbonate (TUMS - DOSED IN MG ELEMENTAL CALCIUM) 500 MG chewable tablet Chew 1 tablet by mouth 3 (three) times daily as needed. For heartburn.    . cholecalciferol (VITAMIN D) 1000 UNITS tablet Take 2,000 Units by mouth daily.     . Coenzyme Q10 (CO Q10) 100 MG TABS Take 1 tablet by mouth daily.      . Doxylamine Succinate, Sleep, (UNISOM) 25 MG tablet Take 12.5 mg by mouth at bedtime as needed. For sleep.    . fexofenadine (ALLEGRA) 180 MG tablet Take 180 mg  by mouth daily.      . Glucose Blood (FREESTYLE LITE TEST VI) Once daily    . hydrochlorothiazide (HYDRODIURIL) 12.5 MG tablet Take 12.5 mg by mouth daily.     . metFORMIN (GLUMETZA) 500 MG (MOD) 24 hr tablet Take 500 mg by mouth daily with breakfast.      . montelukast (SINGULAIR) 5 MG chewable tablet chew and swallow 1 tablet by mouth at bedtime 30 tablet 3  . Multiple Vitamins-Minerals (MULTIVITAMIN WITH MINERALS) tablet Take 1 tablet by mouth daily.      . nitroGLYCERIN (NITROSTAT) 0.4 MG SL tablet Place 1 tablet (0.4 mg total) under the tongue as needed for chest pain. 25 tablet 3  . omeprazole (PRILOSEC) 20 MG capsule take 1 capsule by mouth once daily if needed (Patient taking differently: take 1 capsule by mouth once daily if needed heartburn) 30 capsule 6  . PROAIR HFA  108 (90 BASE) MCG/ACT inhaler inhale 2 puffs by mouth every 4 to 6 hours if needed for wheezing  AND/OR SHORTNESS OF BREATH 8.5 g 4  . Respiratory Therapy Supplies (FLUTTER) DEVI Use flutter valve 1 times daily as needed    . SYMBICORT 80-4.5 MCG/ACT inhaler inhale 2 puffs by mouth INTO THE LUNGS twice a day 10.2 g 0   No current facility-administered medications for this visit.     REVIEW OF SYSTEMS:  [X]  denotes positive finding, [ ]  denotes negative finding Cardiac  Comments:  Chest pain or chest pressure:    Shortness of breath upon exertion:    Short of breath when lying flat:    Irregular heart rhythm:        Vascular    Pain in calf, thigh, or hip brought on by ambulation:    Pain in feet at night that wakes you up from your sleep:     Blood clot in your veins:    Leg swelling:           PHYSICAL EXAM: Vitals:   12/09/16 1053  BP: (!) 147/78  Pulse: 79  Resp: 17  Temp: (!) 97.2 F (36.2 C)  TempSrc: Oral  SpO2: 97%  Weight: 156 lb (70.8 kg)  Height: 5' 3.5" (1.613 m)    GENERAL: The patient is a well-nourished female, in no acute distress. The vital signs are documented above. CARDIOVASCULAR: 2+ radial pulses. She does have what sounds like ideation of cardiac murmur in her carotid arteries bilaterally. She does have a loud heart murmur on cardiac exam PULMONARY: There is good air exchange  MUSCULOSKELETAL: There are no major deformities or cyanosis. NEUROLOGIC: No focal weakness or paresthesias are detected. SKIN: There are no ulcers or rashes noted. PSYCHIATRIC: The patient has a normal affect.  DATA:  Carotid duplex today reveals 40-59% stenosis in her left carotid artery and 1-39% stenosis in her right carotid artery. This has some progression on the left carotid since her study with [place] 2 years ago but is more in line with the study immediately proceeding that 1.  MEDICAL ISSUES: Moderate left carotid stenosis with no symptoms. I discussed this at length  with the patient. I would recommend yearly carotid duplex for surveillance. She knows to present him report immediately to the emergency room should she develop any neurologic deficits. Feel that it is more likely that she had and under reading of her degree of narrowing 2 years ago on her most recent study rather than any significant progression since that time. We will see her in  one year for repeat duplex    Rosetta Posner, MD [facility] Vascular and Vein Specialists of [facility] Tel [phone] Pager [phone]

## 2016-12-31 ENCOUNTER — Other Ambulatory Visit: Payer: Self-pay

## 2016-12-31 ENCOUNTER — Ambulatory Visit (HOSPITAL_COMMUNITY): Payer: Medicare Other | Attending: Cardiology

## 2016-12-31 DIAGNOSIS — I350 Nonrheumatic aortic (valve) stenosis: Secondary | ICD-10-CM | POA: Diagnosis not present

## 2016-12-31 DIAGNOSIS — I10 Essential (primary) hypertension: Secondary | ICD-10-CM | POA: Diagnosis not present

## 2016-12-31 DIAGNOSIS — J45909 Unspecified asthma, uncomplicated: Secondary | ICD-10-CM | POA: Diagnosis not present

## 2016-12-31 DIAGNOSIS — I252 Old myocardial infarction: Secondary | ICD-10-CM | POA: Diagnosis not present

## 2016-12-31 DIAGNOSIS — I2510 Atherosclerotic heart disease of native coronary artery without angina pectoris: Secondary | ICD-10-CM | POA: Insufficient documentation

## 2016-12-31 DIAGNOSIS — E785 Hyperlipidemia, unspecified: Secondary | ICD-10-CM | POA: Diagnosis not present

## 2016-12-31 DIAGNOSIS — I472 Ventricular tachycardia: Secondary | ICD-10-CM | POA: Diagnosis not present

## 2016-12-31 NOTE — Addendum Note (Signed)
Addended by: Lianne Cure A on: 12/31/2016 12:43 PM   Modules accepted: Orders

## 2017-01-08 ENCOUNTER — Ambulatory Visit: Payer: Medicare Other | Admitting: Interventional Cardiology

## 2017-01-08 NOTE — Progress Notes (Deleted)
Cardiology Office Note    Date:  01/08/2017   ID:  Amayah, Staheli 1940-12-08, MRN [id number]  PCP:  [name], MD  Cardiologist: Sinclair Grooms, MD   No chief complaint on file.   History of Present Illness:  Cheryl Hernandez is a 76 y.o. female who presents for CAD, DES RCA 2 2009, paroxysmal palpitations, hypertension, hyperlipidemia, moderate aortic stenosis, and diabetes mellitus type 2.     Past Medical History:  Diagnosis Date  . Aortic stenosis    a. Mod AS by echo 08/2014.  . Arthritis   . Asthma   . CAD (coronary artery disease)    a. NSTEMI 2009 s/p DES to RCAx2.  . Carotid artery disease ([place])    a. Mild-moderate - followed by [facility].  . Chronic diarrhea   . Diabetes mellitus ([place])   . Diabetes mellitus without complication ([place])   . Diverticulosis   . GERD (gastroesophageal reflux disease)   . HTN (hypertension)   . Hyperlipidemia   . Myocardial infarction ([place])   . Obesity   . Paroxysmal ventricular tachycardia ([place])   . Rhinitis   . Rotator cuff tear     Past Surgical History:  Procedure Laterality Date  . ANGIOPLASTY    . CORONARY ANGIOPLASTY WITH STENT PLACEMENT    . TONSILLECTOMY    . TOTAL ABDOMINAL HYSTERECTOMY    . TOTAL HIP ARTHROPLASTY      Current Medications: Outpatient Medications Prior to Visit  Medication Sig Dispense Refill  . ALPRAZolam (XANAX) 0.5 MG tablet Take 1 tablet by mouth as needed. At bedtime  0  . aspirin EC 81 MG tablet Take 162 mg by mouth daily.     . B Complex-C (B-COMPLEX WITH VITAMIN C) tablet Take 1 tablet by mouth 2 (two) times a week.    . budesonide-formoterol (SYMBICORT) 80-4.5 MCG/ACT inhaler Inhale 2 puffs into the lungs 2 (two) times daily. 1 Inhaler 11  . BYSTOLIC 5 MG tablet take 1 tablet by mouth once daily 30 tablet 10  . calcium carbonate (TUMS - DOSED IN MG ELEMENTAL CALCIUM) 500 MG chewable tablet Chew 1 tablet by mouth 3 (three) times daily as needed. For heartburn.    .  cholecalciferol (VITAMIN D) 1000 UNITS tablet Take 2,000 Units by mouth daily.     . Coenzyme Q10 (CO Q10) 100 MG TABS Take 1 tablet by mouth daily.      . Doxylamine Succinate, Sleep, (UNISOM) 25 MG tablet Take 12.5 mg by mouth at bedtime as needed. For sleep.    . fexofenadine (ALLEGRA) 180 MG tablet Take 180 mg by mouth daily.      . Glucose Blood (FREESTYLE LITE TEST VI) Once daily    . hydrochlorothiazide (HYDRODIURIL) 12.5 MG tablet Take 12.5 mg by mouth daily.     . metFORMIN (GLUMETZA) 500 MG (MOD) 24 hr tablet Take 500 mg by mouth daily with breakfast.      . montelukast (SINGULAIR) 5 MG chewable tablet chew and swallow 1 tablet by mouth at bedtime 30 tablet 3  . Multiple Vitamins-Minerals (MULTIVITAMIN WITH MINERALS) tablet Take 1 tablet by mouth daily.      . nitroGLYCERIN (NITROSTAT) 0.4 MG SL tablet Place 1 tablet (0.4 mg total) under the tongue as needed for chest pain. 25 tablet 3  . omeprazole (PRILOSEC) 20 MG capsule take 1 capsule by mouth once daily if needed (Patient taking differently: take 1 capsule by mouth once daily if needed  heartburn) 30 capsule 6  . PROAIR HFA 108 (90 BASE) MCG/ACT inhaler inhale 2 puffs by mouth every 4 to 6 hours if needed for wheezing  AND/OR SHORTNESS OF BREATH 8.5 g 4  . Respiratory Therapy Supplies (FLUTTER) DEVI Use flutter valve 1 times daily as needed    . SYMBICORT 80-4.5 MCG/ACT inhaler inhale 2 puffs by mouth INTO THE LUNGS twice a day 10.2 g 0   No facility-administered medications prior to visit.      Allergies:   Cefuroxime axetil; Cephalexin; Codeine; Doxycycline; and Erythromycin base   Social History   Social History  . Marital status: Widowed    Spouse name: N/A  . Number of children: N/A  . Years of education: N/A   Occupational History  . retired Therapist, sports    Social History Main Topics  . Smoking status: Former Smoker    Packs/day: 1.00    Years: 30.00    Types: Cigarettes    Quit date: 03/31/1998  . Smokeless tobacco:  Never Used     Comment: Quit intermittently since her 104s.  . Alcohol use 0.0 oz/week     Comment: Rare.  . Drug use: No  . Sexual activity: Not on file   Other Topics Concern  . Not on file   Social History Narrative   Originally from [place]. She has traveled to [place], [place], [place], & [place]. Previously worked as a Marine scientist. No known TB exposure. No pets currently. No bird, hot tube, or asbestos exposure. Enjoys playing bridge.      Family History:  The patient's ***family history includes Allergies in her mother; Heart attack in her father; Hyperlipidemia in her mother; Prostate cancer in her maternal grandfather.   ROS:   Please see the history of present illness.    ***  All other systems reviewed and are negative.   PHYSICAL EXAM:   VS:  There were no vitals taken for this visit.   GEN: Well nourished, well developed, in no acute distress  HEENT: normal  Neck: no JVD, carotid bruits, or masses Cardiac: ***RRR; no murmurs, rubs, or gallops,no edema  Respiratory:  clear to auscultation bilaterally, normal work of breathing GI: soft, nontender, nondistended, + BS MS: no deformity or atrophy  Skin: warm and dry, no rash Neuro:  Alert and Oriented x 3, Strength and sensation are intact Psych: euthymic mood, full affect  Wt Readings from Last 3 Encounters:  12/09/16 156 lb (70.8 kg)  09/18/16 158 lb 12.8 oz (72 kg)  01/07/16 166 lb (75.3 kg)      Studies/Labs Reviewed:   EKG:  EKG  ***  Recent Labs: No results found for requested labs within last 8760 hours.   Lipid Panel    Component Value Date/Time   CHOL (H) 12/11/2007 0350    302        ATP III CLASSIFICATION:  <200     mg/dL   Desirable  200-239  mg/dL   Borderline High  >=240    mg/dL   High   TRIG 158 (H) 12/11/2007 0350   HDL 48 12/11/2007 0350   CHOLHDL 6.3 12/11/2007 0350   VLDL 32 12/11/2007 0350   LDLCALC (H) 12/11/2007 0350    222        Total Cholesterol/HDL:CHD Risk Coronary Heart Disease Risk  Table                     Men   Women  1/2 Average Risk  3.4   3.3    Additional studies/ records that were reviewed today include:  ***    ASSESSMENT:    1. Essential hypertension   2. CAD in native artery   3. Coronary artery disease involving native coronary artery of native heart with angina pectoris ([place])   4. Other hyperlipidemia      PLAN:  In order of problems listed above:  1. ***    Medication Adjustments/Labs and Tests Ordered: Current medicines are reviewed at length with the patient today.  Concerns regarding medicines are outlined above.  Medication changes, Labs and Tests ordered today are listed in the Patient Instructions below. There are no Patient Instructions on file for this visit.   Signed, Sinclair Grooms, MD  01/08/2017 10:58 AM    [address], [place], [place]  [id number] Phone: [phone]; Fax: [phone]

## 2017-03-04 NOTE — Progress Notes (Signed)
Cardiology Office Note    Date:  03/05/2017   ID:  Cheryl Hernandez, Cheryl Hernandez [birth year], MRN [id number]  PCP:  [name], MD  Cardiologist: Sinclair Grooms, MD   Chief Complaint  Patient presents with  . Coronary Artery Disease  . Cardiac Valve Problem    History of Present Illness:  Cheryl Hernandez is a 76 y.o. female who presents for CAD, DES RCA 2 2009, paroxysmal palpitations, hypertension, hyperlipidemia, moderate aortic stenosis, and diabetes mellitus type 2.   Dyspnea on exertionIs a major complaint.She denies orthopnea, and PND.  There is no peripheral edema.  She has not had syncope or chest pain.  She has cut back her diuretic regimen due to cramping.  No episodes of angina have occurred.  Pulmonary evaluation demonstrated moderate asthma.   Past Medical History:  Diagnosis Date  . Aortic stenosis    a. Mod AS by echo 08/2014.  . Arthritis   . Asthma   . CAD (coronary artery disease)    a. NSTEMI 2009 s/p DES to RCAx2.  . Carotid artery disease ([place])    a. Mild-moderate - followed by [facility].  . Chronic diarrhea   . Diabetes mellitus ([place])   . Diabetes mellitus without complication ([place])   . Diverticulosis   . GERD (gastroesophageal reflux disease)   . HTN (hypertension)   . Hyperlipidemia   . Myocardial infarction ([place])   . Obesity   . Paroxysmal ventricular tachycardia ([place])   . Rhinitis   . Rotator cuff tear     Past Surgical History:  Procedure Laterality Date  . ANGIOPLASTY    . CORONARY ANGIOPLASTY WITH STENT PLACEMENT    . TONSILLECTOMY    . TOTAL ABDOMINAL HYSTERECTOMY    . TOTAL HIP ARTHROPLASTY      Current Medications: Outpatient Medications Prior to Visit  Medication Sig Dispense Refill  . ALPRAZolam (XANAX) 0.5 MG tablet Take 1 tablet by mouth as needed. At bedtime  0  . aspirin EC 81 MG tablet Take 81 mg by mouth daily.     . B Complex-C (B-COMPLEX WITH VITAMIN C) tablet Take 1 tablet by mouth 2 (two) times a week.    .  budesonide-formoterol (SYMBICORT) 80-4.5 MCG/ACT inhaler Inhale 2 puffs into the lungs 2 (two) times daily. 1 Inhaler 11  . BYSTOLIC 5 MG tablet take 1 tablet by mouth once daily 30 tablet 10  . calcium carbonate (TUMS - DOSED IN MG ELEMENTAL CALCIUM) 500 MG chewable tablet Chew 1 tablet by mouth 3 (three) times daily as needed. For heartburn.    . cholecalciferol (VITAMIN D) 1000 UNITS tablet Take 2,000 Units by mouth daily.     . Coenzyme Q10 (CO Q10) 100 MG TABS Take 1 tablet by mouth daily.      . Doxylamine Succinate, Sleep, (UNISOM) 25 MG tablet Take 12.5 mg by mouth at bedtime as needed. For sleep.    . fexofenadine (ALLEGRA) 180 MG tablet Take 180 mg by mouth daily.      . Glucose Blood (FREESTYLE LITE TEST VI) Once daily    . hydrochlorothiazide (HYDRODIURIL) 12.5 MG tablet Take 12.5 mg by mouth every other day.     . metFORMIN (GLUMETZA) 500 MG (MOD) 24 hr tablet Take 500 mg by mouth daily with breakfast.      . montelukast (SINGULAIR) 5 MG chewable tablet chew and swallow 1 tablet by mouth at bedtime 30 tablet 3  . Multiple Vitamins-Minerals (MULTIVITAMIN WITH MINERALS) tablet  Take 1 tablet by mouth daily.      . nitroGLYCERIN (NITROSTAT) 0.4 MG SL tablet Place 1 tablet (0.4 mg total) under the tongue as needed for chest pain. 25 tablet 3  . omeprazole (PRILOSEC) 20 MG capsule take 1 capsule by mouth once daily if needed (Patient taking differently: take 1 capsule by mouth once daily if needed heartburn) 30 capsule 6  . PROAIR HFA 108 (90 BASE) MCG/ACT inhaler inhale 2 puffs by mouth every 4 to 6 hours if needed for wheezing  AND/OR SHORTNESS OF BREATH 8.5 g 4  . Respiratory Therapy Supplies (FLUTTER) DEVI Use flutter valve 1 times daily as needed    . SYMBICORT 80-4.5 MCG/ACT inhaler inhale 2 puffs by mouth INTO THE LUNGS twice a day (Patient not taking: Reported on 03/05/2017) 10.2 g 0   No facility-administered medications prior to visit.      Allergies:   Cefuroxime axetil;  Cephalexin; Codeine; Doxycycline; and Erythromycin base   Social History   Socioeconomic History  . Marital status: Widowed    Spouse name: None  . Number of children: None  . Years of education: None  . Highest education level: None  Social Needs  . Financial resource strain: None  . Food insecurity - worry: None  . Food insecurity - inability: None  . Transportation needs - medical: None  . Transportation needs - non-medical: None  Occupational History  . Occupation: retired Therapist, sports  Tobacco Use  . Smoking status: Former Smoker    Packs/day: 1.00    Years: 30.00    Pack years: 30.00    Types: Cigarettes    Last attempt to quit: 03/31/1998    Years since quitting: 18.9  . Smokeless tobacco: Never Used  . Tobacco comment: Quit intermittently since her 62s.  Substance and Sexual Activity  . Alcohol use: Yes    Alcohol/week: 0.0 oz    Comment: Rare.  . Drug use: No  . Sexual activity: None  Other Topics Concern  . None  Social History Narrative   Originally from [place]. She has traveled to [place], [place], [place], & [place]. Previously worked as a Marine scientist. No known TB exposure. No pets currently. No bird, hot tube, or asbestos exposure. Enjoys playing bridge.      Family History:  The patient's family history includes Allergies in her mother; Heart attack in her father; Hyperlipidemia in her mother; Prostate cancer in her maternal grandfather.   ROS:   Please see the history of present illness.    Constipation, wheezing, cough, and as mentioned above shortness of breath. All other systems reviewed and are negative.   PHYSICAL EXAM:   VS:  BP (!) 146/76   Pulse 86   Ht 5' 3.5" (1.613 m)   Wt 161 lb 9.6 oz (73.3 kg)   BMI 28.18 kg/m     GEN: Well nourished, well developed, in no acute distress  HEENT: normal  Neck: no JVD, carotid bruits, or masses Cardiac: RRR, rubs, or gallops,no edema.  3/6 to 4/6 crescendo decrescendo systolic murmur of aortic regurgitation.  No diastolic  murmur. Respiratory:  clear to auscultation bilaterally, normal work of breathing GI: soft, nontender, nondistended, + BS MS: no deformity or atrophy  Skin: warm and dry, no rash Neuro:  Alert and Oriented x 3, Strength and sensation are intact Psych: euthymic mood, full affect  Wt Readings from Last 3 Encounters:  03/05/17 161 lb 9.6 oz (73.3 kg)  12/09/16 156 lb (70.8 kg)  09/18/16  158 lb 12.8 oz (72 kg)      Studies/Labs Reviewed:   EKG:  EKG normal sinus rhythm, incomplete right bundle branch block with early QRS transition.  When compared to prior tracings, no significant change has occurred.  Recent Labs: No results found for requested labs within last 8760 hours.   Lipid Panel    Component Value Date/Time   CHOL (H) 12/11/2007 0350    302        ATP III CLASSIFICATION:  <200     mg/dL   Desirable  200-239  mg/dL   Borderline High  >=240    mg/dL   High   TRIG 158 (H) 12/11/2007 0350   HDL 48 12/11/2007 0350   CHOLHDL 6.3 12/11/2007 0350   VLDL 32 12/11/2007 0350   LDLCALC (H) 12/11/2007 0350    222        Total Cholesterol/HDL:CHD Risk Coronary Heart Disease Risk Table                     Men   Women  1/2 Average Risk   3.4   3.3    Additional studies/ records that were reviewed today include:   2D Doppler echocardiogram 12/31/16: ------------------------------------------------------------------- Study Conclusions   - Left ventricle: The cavity size was normal. Systolic function was   normal. The estimated ejection fraction was in the range of 60%   to 65%. Wall motion was normal; there were no regional wall   motion abnormalities. Features are consistent with a pseudonormal   left ventricular filling pattern, with concomitant abnormal   relaxation and increased filling pressure (grade 2 diastolic   dysfunction). - Aortic valve: Valve mobility was restricted. There was moderate   stenosis. Peak velocity (S): 344 cm/s. Mean gradient (S): 28 mm   Hg. -  Mitral valve: Calcified annulus. Mildly thickened, mildly   calcified leaflets . - Left atrium: The atrium was mildly dilated. Anterior-posterior   dimension: 44 mm.    ASSESSMENT:    1. Nonrheumatic aortic valve stenosis   2. CAD in native artery   3. Essential hypertension   4. Dyspnea on exertion   5. Paroxysmal tachycardia ([place])   6. SOB (shortness of breath)      PLAN:  In order of problems listed above:  1. The most recent echocardiogram demonstrates significant progression of aortic stenosis to the moderate range.  Transvalvular velocity is 3.5 m/s up from 2.4 m/s 2 years ago.  No significant regurgitation, LV dysfunction, or mitral valve abnormality.  Patient will need to have a repeat 2D Doppler echocardiogram yearly going forward.  Current complaint of dyspnea is suspected to be more pulmonary than cardiac.  We will check a BNP today to be certain we are not missing a component of heart failure. 2. No symptoms to suggest progression of coronary disease. 3. Blood pressure control is less than optimal due to the patient decreasing diuretic regimen due to cramping.  She is now taking hydrochlorothiazide every other day rather than daily.  She cannot correlate worsening dyspnea on exertion with the reduction in medication intensity. 4. BNP to rule out CHF.  If BNP is elevated we will need to increase diuretic therapy intensity.  If BNP is elevated we may need to consider moving forward with potential valve replacement.  Clinical follow-up in 1 year.  2 Doppler echocardiogram in 1 year.  If BNP is elevated may need to add a more intense diuretic regimen.  May also in  that setting need to consider moving towards valve replacement.  Long discussion concerning natural history of aortic stenosis and treatment options.  Greater than 50% of this office visit was spent in counseling and establishment of a treatment strategy related to progressive dyspnea.  Medication Adjustments/Labs and  Tests Ordered: Current medicines are reviewed at length with the patient today.  Concerns regarding medicines are outlined above.  Medication changes, Labs and Tests ordered today are listed in the Patient Instructions below. Patient Instructions  Medication Instructions:  Your physician recommends that you continue on your current medications as directed. Please refer to the Current Medication list given to you today.  Labwork: Pro BNP today  Testing/Procedures: Your physician has requested that you have an echocardiogram in December 2019. Echocardiography is a painless test that uses sound waves to create images of your heart. It provides your doctor with information about the size and shape of your heart and how well your heart's chambers and valves are working. This procedure takes approximately one hour. There are no restrictions for this procedure.    Follow-Up: Your physician wants you to follow-up in: 1 year with Dr. [name].  You will receive a reminder letter in the mail two months in advance. If you don't receive a letter, please call our office to schedule the follow-up appointment.   Any Other Special Instructions Will Be Listed Below (If Applicable).     If you need a refill on your cardiac medications before your next appointment, please call your pharmacy.      Signed, Sinclair Grooms, MD  03/05/2017 4:54 PM    [facility] [place], [place], [place]  [address] Phone: [phone]; Fax: [phone]

## 2017-03-05 ENCOUNTER — Ambulatory Visit: Payer: Medicare Other | Admitting: Interventional Cardiology

## 2017-03-05 ENCOUNTER — Encounter (INDEPENDENT_AMBULATORY_CARE_PROVIDER_SITE_OTHER): Payer: Self-pay

## 2017-03-05 ENCOUNTER — Encounter: Payer: Self-pay | Admitting: Interventional Cardiology

## 2017-03-05 VITALS — BP 146/76 | HR 86 | Ht 63.5 in | Wt 161.6 lb

## 2017-03-05 DIAGNOSIS — I479 Paroxysmal tachycardia, unspecified: Secondary | ICD-10-CM

## 2017-03-05 DIAGNOSIS — R0609 Other forms of dyspnea: Secondary | ICD-10-CM

## 2017-03-05 DIAGNOSIS — I2510 Atherosclerotic heart disease of native coronary artery without angina pectoris: Secondary | ICD-10-CM

## 2017-03-05 DIAGNOSIS — R0602 Shortness of breath: Secondary | ICD-10-CM

## 2017-03-05 DIAGNOSIS — I10 Essential (primary) hypertension: Secondary | ICD-10-CM

## 2017-03-05 DIAGNOSIS — I350 Nonrheumatic aortic (valve) stenosis: Secondary | ICD-10-CM

## 2017-03-05 NOTE — Patient Instructions (Signed)
Medication Instructions:  Your physician recommends that you continue on your current medications as directed. Please refer to the Current Medication list given to you today.  Labwork: Pro BNP today  Testing/Procedures: Your physician has requested that you have an echocardiogram in December 2019. Echocardiography is a painless test that uses sound waves to create images of your heart. It provides your doctor with information about the size and shape of your heart and how well your heart's chambers and valves are working. This procedure takes approximately one hour. There are no restrictions for this procedure.    Follow-Up: Your physician wants you to follow-up in: 1 year with Dr. [name].  You will receive a reminder letter in the mail two months in advance. If you don't receive a letter, please call our office to schedule the follow-up appointment.   Any Other Special Instructions Will Be Listed Below (If Applicable).     If you need a refill on your cardiac medications before your next appointment, please call your pharmacy.

## 2017-03-06 LAB — PRO B NATRIURETIC PEPTIDE: NT-Pro BNP: 227 pg/mL (ref 0–738)

## 2017-07-22 ENCOUNTER — Other Ambulatory Visit: Payer: Self-pay | Admitting: Interventional Cardiology

## 2017-07-28 ENCOUNTER — Telehealth: Payer: Self-pay | Admitting: Gastroenterology

## 2017-07-28 NOTE — Telephone Encounter (Signed)
Patient states that she may go elsewhere for GI care.

## 2017-07-28 NOTE — Telephone Encounter (Signed)
OK 

## 2017-07-30 ENCOUNTER — Encounter: Payer: Self-pay | Admitting: Gastroenterology

## 2017-10-07 ENCOUNTER — Ambulatory Visit: Payer: Medicare Other | Admitting: Gastroenterology

## 2017-10-07 ENCOUNTER — Encounter (INDEPENDENT_AMBULATORY_CARE_PROVIDER_SITE_OTHER): Payer: Self-pay

## 2017-10-07 ENCOUNTER — Encounter: Payer: Self-pay | Admitting: Gastroenterology

## 2017-10-07 VITALS — BP 136/78 | HR 72 | Ht 63.5 in | Wt 150.8 lb

## 2017-10-07 DIAGNOSIS — K52831 Collagenous colitis: Secondary | ICD-10-CM

## 2017-10-07 DIAGNOSIS — R197 Diarrhea, unspecified: Secondary | ICD-10-CM | POA: Diagnosis not present

## 2017-10-07 MED ORDER — MESALAMINE ER 0.375 G PO CP24: 1.5000 g | ORAL_CAPSULE | Freq: Every day | ORAL | 0 refills | Status: DC

## 2017-10-07 NOTE — Patient Instructions (Signed)
Please start Apriso samples taking 4 capsules by mouth every morning. After you have finished the samples call our office and let us know which medication you with to continue.   Normal BMI (Body Mass Index- based on height and weight) is between 23 and 30. Your BMI today is Body mass index is 26.29 kg/m. Marland Kitchen Please consider follow up  regarding your BMI with your Primary Care Provider.  Thank you for choosing me and [facility].  Pricilla Riffle. Dagoberto Ligas., MD., Marval Regal

## 2017-10-07 NOTE — Progress Notes (Signed)
History of Present Illness: This is a 77 year old female self referred for the evaluation of collagenous colitis and diarrhea for 6 months. She is a retired Therapist, sports.  She was previously cared for by Dr. Delfin Edis.  She was evaluated in [place] by [facility] for chronic diarrhea in September 2011.  Colonoscopy in September 2011 revealed sigmoid diverticulosis, internal hemorrhoids and grossly normal colonic mucosa.  Random biopsies showed collagenous colitis.  Patient relates that she was treated with Asacol for several weeks in 2011 and her symptoms completely abated. She has not been on any medications for collagenous colitis until recently.  About 6 months ago she developed diarrhea that gradually worsened occurring about 3 times per day.  She contacted her gastroenterologist in [place] and was started on budesonide 9 mg daily. The patient self tapered to 3 mg every other day when she developed constipation . She states she now alternates between constipation when taking budesonide to diarrhea on the days she does not take it. Denies weight loss, abdominal pain, change in stool caliber, melena, hematochezia, nausea, vomiting, dysphagia, reflux symptoms, chest pain.    Allergies  Allergen Reactions  . Cefuroxime Axetil Hives and Itching  . Cephalexin Other (See Comments)    Pt. Doesn't remember reaction  . Codeine     Tolerates Dilaudid.  . Doxycycline     REACTION: vomiting/ GI upset  . Erythromycin Base Other (See Comments)    unknown   Outpatient Medications Prior to Visit  Medication Sig Dispense Refill  . ALPRAZolam (XANAX) 0.5 MG tablet Take 1 tablet by mouth as needed. At bedtime  0  . aspirin EC 81 MG tablet Take 81 mg by mouth daily.     . B Complex-C (B-COMPLEX WITH VITAMIN C) tablet Take 1 tablet by mouth 2 (two) times a week.    . BUDESONIDE PO Take 3 mg by mouth.    . budesonide-formoterol (SYMBICORT) 80-4.5 MCG/ACT inhaler Inhale 2 puffs into the lungs 2 (two)  times daily. 1 Inhaler 11  . calcium carbonate (TUMS - DOSED IN MG ELEMENTAL CALCIUM) 500 MG chewable tablet Chew 1 tablet by mouth 3 (three) times daily as needed. For heartburn.    . cholecalciferol (VITAMIN D) 1000 UNITS tablet Take 2,000 Units by mouth daily.     . Coenzyme Q10 (CO Q10) 100 MG TABS Take 1 tablet by mouth daily.      . Doxylamine Succinate, Sleep, (UNISOM) 25 MG tablet Take 12.5 mg by mouth at bedtime as needed. For sleep.    . fexofenadine (ALLEGRA) 180 MG tablet Take 180 mg by mouth daily.      . Glucose Blood (FREESTYLE LITE TEST VI) Once daily    . hydrochlorothiazide (HYDRODIURIL) 12.5 MG tablet Take 12.5 mg by mouth every other day.     . metFORMIN (GLUMETZA) 500 MG (MOD) 24 hr tablet Take 500 mg by mouth daily with breakfast.      . montelukast (SINGULAIR) 5 MG chewable tablet chew and swallow 1 tablet by mouth at bedtime 30 tablet 3  . Multiple Vitamins-Minerals (MULTIVITAMIN WITH MINERALS) tablet Take 1 tablet by mouth daily.      . nebivolol (BYSTOLIC) 5 MG tablet Take 1 tablet (5 mg total) by mouth daily. 90 tablet 2  . nitroGLYCERIN (NITROSTAT) 0.4 MG SL tablet Place 1 tablet (0.4 mg total) under the tongue as needed for chest pain. 25 tablet 3  . PROAIR HFA 108 (90 BASE) MCG/ACT inhaler  inhale 2 puffs by mouth every 4 to 6 hours if needed for wheezing  AND/OR SHORTNESS OF BREATH 8.5 g 4  . Respiratory Therapy Supplies (FLUTTER) DEVI Use flutter valve 1 times daily as needed    . omeprazole (PRILOSEC) 20 MG capsule take 1 capsule by mouth once daily if needed (Patient not taking: Reported on 10/07/2017) 30 capsule 6   No facility-administered medications prior to visit.    Past Medical History:  Diagnosis Date  . Aortic stenosis    a. Mod AS by echo 08/2014.  . Arthritis   . Asthma   . CAD (coronary artery disease)    a. NSTEMI 2009 s/p DES to RCAx2.  . Carotid artery disease ([place])    a. Mild-moderate - followed by [facility].  . Chronic diarrhea   . Diabetes  mellitus ([place])   . Diabetes mellitus without complication ([place])   . Diverticulosis   . GERD (gastroesophageal reflux disease)   . HTN (hypertension)   . Hyperlipidemia   . Myocardial infarction ([place])   . Obesity   . Paroxysmal ventricular tachycardia ([place])   . Rhinitis   . Rotator cuff tear    Past Surgical History:  Procedure Laterality Date  . ANGIOPLASTY    . CORONARY ANGIOPLASTY WITH STENT PLACEMENT    . TONSILLECTOMY    . TOTAL ABDOMINAL HYSTERECTOMY    . TOTAL HIP ARTHROPLASTY     Social History   Socioeconomic History  . Marital status: Widowed    Spouse name: Not on file  . Number of children: Not on file  . Years of education: Not on file  . Highest education level: Not on file  Occupational History  . Occupation: retired Animal nutritionist  . Financial resource strain: Not on file  . Food insecurity:    Worry: Not on file    Inability: Not on file  . Transportation needs:    Medical: Not on file    Non-medical: Not on file  Tobacco Use  . Smoking status: Former Smoker    Packs/day: 1.00    Years: 30.00    Pack years: 30.00    Types: Cigarettes    Last attempt to quit: 03/31/1998    Years since quitting: 19.5  . Smokeless tobacco: Never Used  . Tobacco comment: Quit intermittently since her 44s.  Substance and Sexual Activity  . Alcohol use: Yes    Alcohol/week: 0.0 oz    Comment: Rare.  . Drug use: No  . Sexual activity: Not on file  Lifestyle  . Physical activity:    Days per week: Not on file    Minutes per session: Not on file  . Stress: Not on file  Relationships  . Social connections:    Talks on phone: Not on file    Gets together: Not on file    Attends religious service: Not on file    Active member of club or organization: Not on file    Attends meetings of clubs or organizations: Not on file    Relationship status: Not on file  Other Topics Concern  . Not on file  Social History Narrative   Originally from [place]. She has traveled to  [place], [place], [place], & [place]. Previously worked as a Marine scientist. No known TB exposure. No pets currently. No bird, hot tube, or asbestos exposure. Enjoys playing bridge.    Family History  Problem Relation Age of Onset  . Allergies Mother   . Hyperlipidemia Mother   .  Prostate cancer Maternal Grandfather   . Heart attack Father   . Lung disease Neg Hx        Review of Systems: Pertinent positive and negative review of systems were noted in the above HPI section. All other review of systems were otherwise negative.    Physical Exam: General: Well developed, well nourished, no acute distress Head: Normocephalic and atraumatic Eyes:  sclerae anicteric, EOMI Ears: Normal auditory acuity Mouth: No deformity or lesions Neck: Supple, no masses or thyromegaly Lungs: Clear throughout to auscultation Heart: Regular rate and rhythm; no murmurs, rubs or bruits Abdomen: Soft, non tender and non distended. No masses, hepatosplenomegaly or hernias noted. Normal Bowel sounds Rectal: Not done Musculoskeletal: Symmetrical with no gross deformities  Skin: No lesions on visible extremities Pulses:  Normal pulses noted Extremities: No clubbing, cyanosis, edema or deformities noted Neurological: Alert oriented x 4, grossly nonfocal Cervical Nodes:  No significant cervical adenopathy Inguinal Nodes: No significant inguinal adenopathy Psychological:  Alert and cooperative. Normal mood and affect  Assessment and Recommendations:  1. Collagenous colitis and diarrhea.  We discussed the option of taking budesonide 3 mg every other day or every 3rd day and taking MiraLAX daily as needed for constipation.  The other option we discussed was mesalamine as it had been effective for her previously. After a thorough discussion she decided on a trial of mesalamine.  Discontinue budesonide and begin Apriso 1.5 g every morning and if this is effective we will refill for next 2 months. If not effective resume budesonide/  Miralax regimen described above.  If symptoms persist and are not adequately controlled I recommended proceeding with colonoscopy, which she is reluctant to do at this time. REV in 6-8 weeks.   cc: Berneta Sages, MD

## 2017-10-13 ENCOUNTER — Ambulatory Visit: Payer: Medicare Other | Admitting: Pulmonary Disease

## 2017-10-15 DIAGNOSIS — M179 Osteoarthritis of knee, unspecified: Secondary | ICD-10-CM | POA: Insufficient documentation

## 2017-11-18 ENCOUNTER — Ambulatory Visit: Payer: Medicare Other | Admitting: Pulmonary Disease

## 2017-11-18 ENCOUNTER — Encounter: Payer: Self-pay | Admitting: Pulmonary Disease

## 2017-11-18 DIAGNOSIS — J4540 Moderate persistent asthma, uncomplicated: Secondary | ICD-10-CM | POA: Diagnosis not present

## 2017-11-18 DIAGNOSIS — J301 Allergic rhinitis due to pollen: Secondary | ICD-10-CM

## 2017-11-18 DIAGNOSIS — Z23 Encounter for immunization: Secondary | ICD-10-CM

## 2017-11-18 NOTE — Assessment & Plan Note (Signed)
Okay to take over-the-counter antihistaminic and Flonase as needed

## 2017-11-18 NOTE — Patient Instructions (Signed)
Flu shot today. Refills on Symbicort Use albuterol as needed only for rescue

## 2017-11-18 NOTE — Assessment & Plan Note (Signed)
Flu shot today. Refills on Symbicort Use albuterol as needed only for rescue  She seems to be using Symbicort more on an as-needed basis but this strategy seems to have worked for her over the past year.

## 2017-11-18 NOTE — Progress Notes (Signed)
   Subjective:    Patient ID: Cheryl Hernandez, female    DOB: 03/04/1941, 77 y.o.   MRN: [id number]  HPI 77 year old for follow-up of moderate persistent asthma and chronic allergic rhinitis She is to see Dr. Ashok Cordia and is establishing with me today.  She states that various doctors have called it differently-asthma versus asthmatic bronchitis.  She had a good year.  She only uses Symbicort once daily, occasionally 1 to 2 puffs She has not needed albuterol at all in the past year.  She has seasonal allergies for which she takes Claritin and Flonase over-the-counter  Significant tests/ events reviewed  PFT 08/2015: FVC 2.24 L (85%) FEV1 1.82 L (93%) FEV1/FVC 0.81  no bronchodilator response TLC 4.49 L (91%) RV 90% ERV 27% DLCO uncorrected 81% (hemoglobin 13.6)   11/2014:FVC 3.52 L (130%) +321FEV1 2.81 L (139%) FEV1/FVC 0.80   09/13/10:FVC 2.49 L (87%) FEV1 1.92 L (89%) FEV1/FVC 0.77  Review of Systems Patient denies significant dyspnea,cough, hemoptysis,  chest pain, palpitations, pedal edema, orthopnea, paroxysmal nocturnal dyspnea, lightheadedness, nausea, vomiting, abdominal or  leg pains      Objective:   Physical Exam   Gen. Pleasant, well-nourished, in no distress ENT - no thrush, no post nasal drip Neck: No JVD, no thyromegaly, no carotid bruits Lungs: no use of accessory muscles, no dullness to percussion, clear without rales or rhonchi  Cardiovascular: Rhythm regular, heart sounds  normal, no murmurs or gallops, no peripheral edema Musculoskeletal: No deformities, no cyanosis or clubbing         Assessment & Plan:

## 2017-11-21 ENCOUNTER — Other Ambulatory Visit: Payer: Self-pay | Admitting: Interventional Cardiology

## 2017-12-11 ENCOUNTER — Ambulatory Visit: Payer: Medicare Other | Admitting: Gastroenterology

## 2018-01-06 ENCOUNTER — Telehealth: Payer: Self-pay | Admitting: Interventional Cardiology

## 2018-01-06 NOTE — Telephone Encounter (Signed)
New Message:     Patient is requesting to have an ECHO before her appt 02/23/18

## 2018-01-07 NOTE — Telephone Encounter (Signed)
Note left for echo scheduler to call pt to schedule.

## 2018-01-13 ENCOUNTER — Other Ambulatory Visit: Payer: Self-pay

## 2018-01-13 MED ORDER — BUDESONIDE-FORMOTEROL FUMARATE 80-4.5 MCG/ACT IN AERO: 2.0000 | INHALATION_SPRAY | Freq: Two times a day (BID) | RESPIRATORY_TRACT | 11 refills | Status: DC

## 2018-01-29 ENCOUNTER — Encounter (INDEPENDENT_AMBULATORY_CARE_PROVIDER_SITE_OTHER): Payer: Self-pay

## 2018-01-29 ENCOUNTER — Ambulatory Visit (HOSPITAL_COMMUNITY): Payer: Medicare Other | Attending: Interventional Cardiology

## 2018-01-29 ENCOUNTER — Other Ambulatory Visit: Payer: Self-pay

## 2018-01-29 ENCOUNTER — Telehealth: Payer: Self-pay | Admitting: *Deleted

## 2018-01-29 DIAGNOSIS — I350 Nonrheumatic aortic (valve) stenosis: Secondary | ICD-10-CM | POA: Diagnosis present

## 2018-01-29 NOTE — Telephone Encounter (Signed)
Left message to call back  

## 2018-01-29 NOTE — Telephone Encounter (Signed)
-----   Message from Belva Crome, MD sent at 01/29/2018  4:07 PM EDT ----- Let the patient know that the aortic valve is slightly worse than on the last study.  Has not progressed to a point where any particular action is required.  We will discuss further at the office visit. A copy will be sent to [name], MD

## 2018-02-03 NOTE — Telephone Encounter (Signed)
Pt made aware of echo results and plan per Dr [name]. Pt verbalized understanding and agrees with this plan. Copy sent to PCP.

## 2018-02-03 NOTE — Telephone Encounter (Signed)
Follow up  ° ° °Patient is returning call.  °

## 2018-02-21 NOTE — Progress Notes (Signed)
Cardiology Office Note:    Date:  02/23/2018   ID:  Cheryl DIBUONO, DOB [birth year], MRN [id number]  PCP:  [name], MD  Cardiologist:  Sinclair Grooms, MD   Referring MD: [name], MD   Chief Complaint  Patient presents with  . Coronary Artery Disease    History of Present Illness:    Cheryl Hernandez is a 77 y.o. female with a hx of f CAD, DES RCA 2 2009, paroxysmal palpitations, hypertension, hyperlipidemia, moderate aortic stenosis, and diabetes mellitus type 2.    Over the last 12 to 18 months she has noted progressive exertional fatigue and worsening dyspnea on exertion.  No acute episodes of worsening but gradual slow limitation.  She has not had syncope.  She denies chest discomfort.  She has not noted lower extremity swelling or orthopnea.  Appetite is been stable.  No chills or fever.  She does not get more than 150 minutes of moderate activity per week.  She avoids certain activities because she feels bad if she overexerts.   Past Medical History:  Diagnosis Date  . Aortic stenosis    a. Mod AS by echo 08/2014.  . Arthritis   . Asthma   . CAD (coronary artery disease)    a. NSTEMI 2009 s/p DES to RCAx2.  . Carotid artery disease ([place])    a. Mild-moderate - followed by [facility].  . Chronic diarrhea   . Diabetes mellitus ([place])   . Diabetes mellitus without complication ([place])   . Diverticulosis   . GERD (gastroesophageal reflux disease)   . HTN (hypertension)   . Hyperlipidemia   . Myocardial infarction ([place])   . Obesity   . Paroxysmal ventricular tachycardia ([place])   . Rhinitis   . Rotator cuff tear     Past Surgical History:  Procedure Laterality Date  . ANGIOPLASTY    . CORONARY ANGIOPLASTY WITH STENT PLACEMENT    . TONSILLECTOMY    . TOTAL ABDOMINAL HYSTERECTOMY    . TOTAL HIP ARTHROPLASTY      Current Medications: Current Meds  Medication Sig  . aspirin EC 81 MG tablet Take 81 mg by mouth daily.   . B Complex-C  (B-COMPLEX WITH VITAMIN C) tablet Take 1 tablet by mouth 2 (two) times a week.  . budesonide-formoterol (SYMBICORT) 80-4.5 MCG/ACT inhaler Inhale 2 puffs into the lungs 2 (two) times daily.  . calcium carbonate (TUMS - DOSED IN MG ELEMENTAL CALCIUM) 500 MG chewable tablet Chew 1 tablet by mouth 3 (three) times daily as needed. For heartburn.  . cholecalciferol (VITAMIN D) 1000 UNITS tablet Take 2,000 Units by mouth daily.   . Coenzyme Q10 (CO Q10) 100 MG TABS Take 1 tablet by mouth daily.    . Doxylamine Succinate, Sleep, (UNISOM) 25 MG tablet Take 12.5 mg by mouth at bedtime as needed. For sleep.  . fexofenadine (ALLEGRA) 180 MG tablet Take 180 mg by mouth daily.    . Glucose Blood (FREESTYLE LITE TEST VI) Once daily  . hydrochlorothiazide (HYDRODIURIL) 12.5 MG tablet Take 12.5 mg by mouth every other day.   . metFORMIN (GLUMETZA) 500 MG (MOD) 24 hr tablet Take 500 mg by mouth daily with breakfast.    . Multiple Vitamins-Minerals (MULTIVITAMIN WITH MINERALS) tablet Take 1 tablet by mouth daily.    . nebivolol (BYSTOLIC) 5 MG tablet Take 1 tablet (5 mg total) by mouth daily.  . nitroGLYCERIN (NITROSTAT) 0.4 MG SL tablet Place 1 tablet (0.4 mg  total) under the tongue every 5 (five) minutes as needed for chest pain. Please make yearly appt for December.  Marland Kitchen Respiratory Therapy Supplies (FLUTTER) DEVI Use flutter valve 1 times daily as needed     Allergies:   Cefuroxime axetil; Cephalexin; Codeine; Doxycycline; and Erythromycin base   Social History   Socioeconomic History  . Marital status: Widowed    Spouse name: Not on file  . Number of children: Not on file  . Years of education: Not on file  . Highest education level: Not on file  Occupational History  . Occupation: retired Animal nutritionist  . Financial resource strain: Not on file  . Food insecurity:    Worry: Not on file    Inability: Not on file  . Transportation needs:    Medical: Not on file    Non-medical: Not on file    Tobacco Use  . Smoking status: Former Smoker    Packs/day: 1.00    Years: 30.00    Pack years: 30.00    Types: Cigarettes    Last attempt to quit: 03/31/1998    Years since quitting: 19.9  . Smokeless tobacco: Never Used  . Tobacco comment: Quit intermittently since her 36s.  Substance and Sexual Activity  . Alcohol use: Yes    Alcohol/week: 0.0 standard drinks    Comment: Rare.  . Drug use: No  . Sexual activity: Not on file  Lifestyle  . Physical activity:    Days per week: Not on file    Minutes per session: Not on file  . Stress: Not on file  Relationships  . Social connections:    Talks on phone: Not on file    Gets together: Not on file    Attends religious service: Not on file    Active member of club or organization: Not on file    Attends meetings of clubs or organizations: Not on file    Relationship status: Not on file  Other Topics Concern  . Not on file  Social History Narrative   Originally from [place]. She has traveled to [place], [place], [place], & [place]. Previously worked as a Marine scientist. No known TB exposure. No pets currently. No bird, hot tube, or asbestos exposure. Enjoys playing bridge.      Family History: The patient's family history includes Allergies in her mother; Heart attack in her father; Hyperlipidemia in her mother; Prostate cancer in her maternal grandfather. There is no history of Lung disease.  ROS:   Please see the history of present illness.    Cough, knee discomfort bilaterally limits activity to some degree, rare lower extremity swelling, joint swelling, constipation, muscle pain, and episodes of dizziness.  Stop taking hydrochlorothiazide because she felt it was causing cramping.  All other systems reviewed and are negative.  EKGs/Labs/Other Studies Reviewed:    The following studies were reviewed today:  2D Doppler echocardiogram January 29, 2018: Study Conclusions  - Left ventricle: The cavity size was normal. There was mild focal   basal  hypertrophy of the septum. Systolic function was vigorous.   The estimated ejection fraction was in the range of 65% to 70%.   Wall motion was normal; there were no regional wall motion   abnormalities. Features are consistent with a pseudonormal left   ventricular filling pattern, with concomitant abnormal relaxation   and increased filling pressure (grade 2 diastolic dysfunction). - Aortic valve: Valve mobility was restricted. There was moderate   stenosis. There was trivial regurgitation.  Peak aortic valve velocity is 3.62 m/s. - Mitral valve: Calcified annulus. Mildly thickened leaflets .   There was mild regurgitation directed centrally. - Tricuspid valve: There was mild-moderate regurgitation directed   centrally. - Pulmonary arteries: Systolic pressure was mildly increased. PA   peak pressure: 42 mm Hg (S).    EKG:  EKG is performed today on 02/23/2018 and demonstrates normal sinus rhythm with nonspecific ST abnormality.  When compared to prior tracings of December 2018, no change has occurred.  Recent Labs: 03/05/2017: NT-Pro BNP 227  Recent Lipid Panel    Component Value Date/Time   CHOL (H) 12/11/2007 0350    302        ATP III CLASSIFICATION:  <200     mg/dL   Desirable  200-239  mg/dL   Borderline High  >=240    mg/dL   High   TRIG 158 (H) 12/11/2007 0350   HDL 48 12/11/2007 0350   CHOLHDL 6.3 12/11/2007 0350   VLDL 32 12/11/2007 0350   LDLCALC (H) 12/11/2007 0350    222        Total Cholesterol/HDL:CHD Risk Coronary Heart Disease Risk Table                     Men   Women  1/2 Average Risk   3.4   3.3    Physical Exam:    VS:  BP 138/72   Pulse 83   Ht 5\' 3"  (1.6 m)   Wt 154 lb 9.6 oz (70.1 kg)   SpO2 95%   BMI 27.39 kg/m     Wt Readings from Last 3 Encounters:  02/23/18 154 lb 9.6 oz (70.1 kg)  11/18/17 153 lb (69.4 kg)  10/07/17 150 lb 12.8 oz (68.4 kg)     GEN: Losing weight.  Appears younger than stated age.. No acute distress HEENT:  Normal NECK: No JVD. LYMPHATICS: No lymphadenopathy CARDIAC: RRR.  Murmur 3/6 high-pitched crescendo decrescendo right upper sternal border systolic.  No diastolic component to the murmur. VASCULAR: Pulses diminished bilateral carotid, Bruits bilateral transmitted bruits. RESPIRATORY:  Clear to auscultation without rales, wheezing or rhonchi  ABDOMEN: Soft, non-tender, non-distended, No pulsatile mass, MUSCULOSKELETAL: No deformity  SKIN: Warm and dry NEUROLOGIC:  Alert and oriented x 3 PSYCHIATRIC:  Normal affect   ASSESSMENT:    1. Nonrheumatic aortic valve stenosis   2. Coronary artery disease involving native coronary artery of native heart with angina pectoris (HCC)   3. Paroxysmal tachycardia ([place])   4. Essential hypertension   5. Other hyperlipidemia   6. Type 2 diabetes mellitus without complication, without long-term current use of insulin (HCC)    PLAN:    In order of problems listed above:  1. Worsening aortic stenosis.  Has relatively small LV cavity and low stroke volume.  Though her velocity is not above 4 m/s, she likely has paradoxical low flow, low gradient severe aortic stenosis.  I have recommended coronary angiography with right heart catheterization.  We will then refer her to the valve clinic where she wants Dr. Darylene Price to participate in her care.  We discussed treatment options and hopefully her management will be all percutaneous and not open heart surgery which she would likely not except. 2. No symptoms to suggest angina.  Coronary angiography will help exclude silent progression and contribution to her current symptoms. 3. No recent episodes of tachycardia 4. She has stopped taking HCTZ because of cramping.  Blood pressure is mildly  elevated and with aortic stenosis or intracavitary pressures are probably even higher than we think.  She seems to be doing okay currently so will leave things as they are. 5. LDL target should be less than 70 given underlying  coronary disease. 6. Consider SGLT2 therapy  Natural history of aortic valve stenosis was discussed in detail.  Cardinal symptoms of angina, syncope, and dyspnea were reviewed and significance relative to prognosis was described.  The importance of sequential imaging for disease monitoring was emphasized.  Work-up including possible heart catheterization and CT angiography were described as essential components of staging for therapy.  Treatment options, TAVR and SAVR, were discussed in some detail with emphasis on TAVR.  The patient was counseled to undergo left heart catheterization, coronary angiography, and possible percutaneous coronary intervention with stent implantation. The procedural risks and benefits were discussed in detail. The risks discussed included death, stroke, myocardial infarction, life-threatening bleeding, limb ischemia, kidney injury, allergy, and possible emergency cardiac surgery. The risk of these significant complications were estimated to occur less than 1% of the time. After discussion, the patient has agreed to proceed.  Greater than 50% of the time during this office visit was spent in education, counseling, and coordination of care related to underlying disease process and testing as outlined.    Medication Adjustments/Labs and Tests Ordered: Current medicines are reviewed at length with the patient today.  Concerns regarding medicines are outlined above.  Orders Placed This Encounter  Procedures  . Basic metabolic panel  . CBC  . EKG 12-Lead   No orders of the defined types were placed in this encounter.   Patient Instructions  Medication Instructions:  Your physician recommends that you continue on your current medications as directed. Please refer to the Current Medication list given to you today.  If you need a refill on your cardiac medications before your next appointment, please call your pharmacy.   Lab work: Your physician recommends that you  return for lab work 5-7 days prior to your heart cath. (Cath scheduled 04/15/2018)  If you have labs (blood work) drawn today and your tests are completely normal, you will receive your results only by: Marland Kitchen MyChart Message (if you have MyChart) OR . A paper copy in the mail If you have any lab test that is abnormal or we need to change your treatment, we will call you to review the results.  Testing/Procedures: Your physician has requested that you have a cardiac catheterization. Cardiac catheterization is used to diagnose and/or treat various heart conditions. Doctors may recommend this procedure for a number of different reasons. The most common reason is to evaluate chest pain. Chest pain can be a symptom of coronary artery disease (CAD), and cardiac catheterization can show whether plaque is narrowing or blocking your heart's arteries. This procedure is also used to evaluate the valves, as well as measure the blood flow and oxygen levels in different parts of your heart. For further information please visit HugeFiesta.[email or web address]. Please follow instruction sheet, as given.   Follow-Up: At [facility], you and your health needs are our priority.  As part of our continuing mission to provide you with exceptional heart care, we have created designated Provider Care Teams.  These Care Teams include your primary Cardiologist (physician) and Advanced Practice Providers (APPs -  Physician Assistants and Nurse Practitioners) who all work together to provide you with the care you need, when you need it. You will need a follow up appointment in 2 weeks after  heart cath.  Please call our office 2 months in advance to schedule this appointment.  You may see Sinclair Grooms, MD or one of the following Advanced Practice Providers on your designated Care Team:   Truitt Merle, NP Cecilie Kicks, NP . Kathyrn Drown, NP  Any Other Special Instructions Will Be Listed Below (If Applicable).     [facility] OFFICE [place], [place] [place] [address] Dept: [phone] Loc: [phone]  Cheryl Hernandez  02/23/2018  You are scheduled for a Cardiac Catheterization on Thursday, January 16 with Dr. Daneen Schick.  1. Please arrive at the [facility] (Main Entrance A) at [facility]: [address] [place], [place] [address] at 5:30 AM (This time is two hours before your procedure to ensure your preparation). Free valet parking service is available.   Special note: Every effort is made to have your procedure done on time. Please understand that emergencies sometimes delay scheduled procedures.  2. Diet: Do not eat solid foods after midnight.  The patient may have clear liquids until 5am upon the day of the procedure.  3. Labs: You will need to have blood drawn 5-7 days prior to your heart cath.  4. Medication instructions in preparation for your procedure:   Contrast Allergy: No  Do not take your Hydrochlorothiazide or your Metformin the morning of your procedure.  On the morning of your procedure, take your Aspirin and any morning medicines NOT listed above.  You may use sips of water.  5. Plan for one night stay--bring personal belongings. 6. Bring a current list of your medications and current insurance cards. 7. You MUST have a responsible person to drive you home. 8. Someone MUST be with you the first 24 hours after you arrive home or your discharge will be delayed. 9. Please wear clothes that are easy to get on and off and wear slip-on shoes.  Thank you for allowing [place] to care for you!   -- [facility]       Signed, Sinclair Grooms, MD  02/23/2018 2:31 PM    [place]

## 2018-02-23 ENCOUNTER — Ambulatory Visit: Payer: Medicare Other | Admitting: Interventional Cardiology

## 2018-02-23 ENCOUNTER — Encounter: Payer: Self-pay | Admitting: Interventional Cardiology

## 2018-02-23 VITALS — BP 138/72 | HR 83 | Ht 63.0 in | Wt 154.6 lb

## 2018-02-23 DIAGNOSIS — I479 Paroxysmal tachycardia, unspecified: Secondary | ICD-10-CM

## 2018-02-23 DIAGNOSIS — E119 Type 2 diabetes mellitus without complications: Secondary | ICD-10-CM

## 2018-02-23 DIAGNOSIS — I25119 Atherosclerotic heart disease of native coronary artery with unspecified angina pectoris: Secondary | ICD-10-CM | POA: Diagnosis not present

## 2018-02-23 DIAGNOSIS — E7849 Other hyperlipidemia: Secondary | ICD-10-CM

## 2018-02-23 DIAGNOSIS — I350 Nonrheumatic aortic (valve) stenosis: Secondary | ICD-10-CM | POA: Diagnosis not present

## 2018-02-23 DIAGNOSIS — I10 Essential (primary) hypertension: Secondary | ICD-10-CM

## 2018-02-23 NOTE — Patient Instructions (Signed)
Medication Instructions:  Your physician recommends that you continue on your current medications as directed. Please refer to the Current Medication list given to you today.  If you need a refill on your cardiac medications before your next appointment, please call your pharmacy.   Lab work: Your physician recommends that you return for lab work 5-7 days prior to your heart cath. (Cath scheduled 04/15/2018)  If you have labs (blood work) drawn today and your tests are completely normal, you will receive your results only by: Marland Kitchen MyChart Message (if you have MyChart) OR . A paper copy in the mail If you have any lab test that is abnormal or we need to change your treatment, we will call you to review the results.  Testing/Procedures: Your physician has requested that you have a cardiac catheterization. Cardiac catheterization is used to diagnose and/or treat various heart conditions. Doctors may recommend this procedure for a number of different reasons. The most common reason is to evaluate chest pain. Chest pain can be a symptom of coronary artery disease (CAD), and cardiac catheterization can show whether plaque is narrowing or blocking your heart's arteries. This procedure is also used to evaluate the valves, as well as measure the blood flow and oxygen levels in different parts of your heart. For further information please visit HugeFiesta.[email or web address]. Please follow instruction sheet, as given.   Follow-Up: At [facility], you and your health needs are our priority.  As part of our continuing mission to provide you with exceptional heart care, we have created designated Provider Care Teams.  These Care Teams include your primary Cardiologist (physician) and Advanced Practice Providers (APPs -  Physician Assistants and Nurse Practitioners) who all work together to provide you with the care you need, when you need it. You will need a follow up appointment in 2 weeks after heart cath.  Please call  our office 2 months in advance to schedule this appointment.  You may see Sinclair Grooms, MD or one of the following Advanced Practice Providers on your designated Care Team:   Truitt Merle, NP Cecilie Kicks, NP . Kathyrn Drown, NP  Any Other Special Instructions Will Be Listed Below (If Applicable).     [facility] OFFICE [place], [place] [address] Dept: [phone] Loc: [phone]  Ennis A Waggoner  02/23/2018  You are scheduled for a Cardiac Catheterization on Thursday, January 16 with Dr. Daneen Schick.  1. Please arrive at the [facility] (Main Entrance A) at [facility]: [address] [place],  [address] at 5:30 AM (This time is two hours before your procedure to ensure your preparation). Free valet parking service is available.   Special note: Every effort is made to have your procedure done on time. Please understand that emergencies sometimes delay scheduled procedures.  2. Diet: Do not eat solid foods after midnight.  The patient may have clear liquids until 5am upon the day of the procedure.  3. Labs: You will need to have blood drawn 5-7 days prior to your heart cath.  4. Medication instructions in preparation for your procedure:   Contrast Allergy: No  Do not take your Hydrochlorothiazide or your Metformin the morning of your procedure.  On the morning of your procedure, take your Aspirin and any morning medicines NOT listed above.  You may use sips of water.  5. Plan for one night stay--bring personal belongings. 6. Bring a  current list of your medications and current insurance cards. 7. You MUST have a responsible person to drive you home. 8. Someone MUST be with you the first 24 hours after you arrive home or your discharge will be delayed. 9. Please wear clothes that are easy to get on and off and wear slip-on shoes.  Thank you for allowing  [place] to care for you!   -- [facility]

## 2018-03-01 ENCOUNTER — Telehealth: Payer: Self-pay | Admitting: Interventional Cardiology

## 2018-03-01 NOTE — Telephone Encounter (Signed)
Encounter not needed

## 2018-03-02 ENCOUNTER — Telehealth: Payer: Self-pay | Admitting: Interventional Cardiology

## 2018-03-02 NOTE — Telephone Encounter (Signed)
  Patient needs to cancel her cath appt on 04/15/2018 because of a conflict in her schedule

## 2018-03-02 NOTE — Telephone Encounter (Signed)
Spoke with pt and she states that she needs to cancel her heart cath for January until she can get her schedule straightened out.  She will call once she has everything organized and reschedule.

## 2018-03-12 ENCOUNTER — Other Ambulatory Visit: Payer: Self-pay

## 2018-03-15 ENCOUNTER — Ambulatory Visit: Payer: Medicare Other | Admitting: Interventional Cardiology

## 2018-03-29 ENCOUNTER — Encounter (HOSPITAL_COMMUNITY): Payer: Medicare Other

## 2018-03-29 ENCOUNTER — Ambulatory Visit: Payer: Medicare Other | Admitting: Family

## 2018-04-08 ENCOUNTER — Other Ambulatory Visit: Payer: Medicare Other

## 2018-04-15 ENCOUNTER — Ambulatory Visit (HOSPITAL_COMMUNITY): Admit: 2018-04-15 | Payer: Medicare Other | Admitting: Interventional Cardiology

## 2018-04-15 ENCOUNTER — Encounter (HOSPITAL_COMMUNITY): Payer: Self-pay

## 2018-04-15 SURGERY — RIGHT/LEFT HEART CATH AND CORONARY ANGIOGRAPHY
Anesthesia: LOCAL

## 2018-04-23 NOTE — Progress Notes (Signed)
HISTORY AND PHYSICAL     CC:  follow up. Requesting Provider:  [name], MD  HPI: This is a 78 y.o. female here for follow up for carotid artery stenosis.    Pt was last seen 12/09/16 by Dr. Donnetta Hutching.  At that time she was asymptomatic.  Her duplex revealed 40-59% left ICA stenosis.  He recommended f/u in one year.    She is doing well and denies any amaurosis fugax, speech difficulties or hemiparesis.  She is having problems with bilateral rotator cuffs.  She states she knows about her murmur and aortic stenosis.   She states she is on a statin but does not take it regularly as it causes constipation, so when she has diarrhea is when she takes it.   The pt is on a statin for cholesterol management.  The pt is diabetic.  She is on oral agent The pt is on BB and HTTZ for hypertension.   Tobacco hx:  Remote:  Quit in 2000 The pt is on a daily aspirin.  Pt meds includes: Statin:  Yes.   Beta Blocker:  Yes.   Aspirin:  Yes.   ACEI:  No. ARB:  No. CCB use:  No Other Antiplatelet/Anticoagulant:  No   Past Medical History:  Diagnosis Date  . Aortic stenosis    a. Mod AS by echo 08/2014.  . Arthritis   . Asthma   . CAD (coronary artery disease)    a. NSTEMI 2009 s/p DES to RCAx2.  . Carotid artery disease ([place])    a. Mild-moderate - followed by [facility].  . Chronic diarrhea   . Diabetes mellitus ([place])   . Diabetes mellitus without complication ([place])   . Diverticulosis   . GERD (gastroesophageal reflux disease)   . HTN (hypertension)   . Hyperlipidemia   . Myocardial infarction ([place])   . Obesity   . Paroxysmal ventricular tachycardia ([place])   . Rhinitis   . Rotator cuff tear     Past Surgical History:  Procedure Laterality Date  . ANGIOPLASTY    . CORONARY ANGIOPLASTY WITH STENT PLACEMENT    . TONSILLECTOMY    . TOTAL ABDOMINAL HYSTERECTOMY    . TOTAL HIP ARTHROPLASTY      Allergies  Allergen Reactions  . Cefuroxime Axetil Hives and Itching  . Cephalexin Other  (See Comments)    Pt. Doesn't remember reaction Total body pain in joints, couldn't swallow   . Codeine     Tolerates Dilaudid.  . Doxycycline     REACTION: vomiting/ GI upset  . Erythromycin Base Other (See Comments)    unknown    Current Outpatient Medications  Medication Sig Dispense Refill  . aspirin EC 81 MG tablet Take 81 mg by mouth daily.     . B Complex-C (B-COMPLEX WITH VITAMIN C) tablet Take 1 tablet by mouth 2 (two) times a week.    . budesonide-formoterol (SYMBICORT) 80-4.5 MCG/ACT inhaler Inhale 2 puffs into the lungs 2 (two) times daily. 1 Inhaler 11  . calcium carbonate (TUMS - DOSED IN MG ELEMENTAL CALCIUM) 500 MG chewable tablet Chew 1 tablet by mouth 3 (three) times daily as needed. For heartburn.    . cholecalciferol (VITAMIN D) 1000 UNITS tablet Take 2,000 Units by mouth daily.     . Coenzyme Q10 (CO Q10) 100 MG TABS Take 1 tablet by mouth daily.      . Doxylamine Succinate, Sleep, (UNISOM) 25 MG tablet Take 12.5 mg by mouth at bedtime as  needed. For sleep.    . fexofenadine (ALLEGRA) 180 MG tablet Take 180 mg by mouth daily.      . Glucose Blood (FREESTYLE LITE TEST VI) Once daily    . hydrochlorothiazide (HYDRODIURIL) 12.5 MG tablet Take 12.5 mg by mouth every other day.     . metFORMIN (GLUMETZA) 500 MG (MOD) 24 hr tablet Take 500 mg by mouth daily with breakfast.      . Multiple Vitamins-Minerals (MULTIVITAMIN WITH MINERALS) tablet Take 1 tablet by mouth daily.      . nebivolol (BYSTOLIC) 5 MG tablet Take 1 tablet (5 mg total) by mouth daily. 90 tablet 2  . nitroGLYCERIN (NITROSTAT) 0.4 MG SL tablet Place 1 tablet (0.4 mg total) under the tongue every 5 (five) minutes as needed for chest pain. Please make yearly appt for December. 25 tablet 0  . Respiratory Therapy Supplies (FLUTTER) DEVI Use flutter valve 1 times daily as needed     No current facility-administered medications for this visit.     Family History  Problem Relation Age of Onset  . Allergies  Mother   . Hyperlipidemia Mother   . Prostate cancer Maternal Grandfather   . Heart attack Father   . Lung disease Neg Hx     Social History   Socioeconomic History  . Marital status: Widowed    Spouse name: Not on file  . Number of children: Not on file  . Years of education: Not on file  . Highest education level: Not on file  Occupational History  . Occupation: retired Animal nutritionist  . Financial resource strain: Not on file  . Food insecurity:    Worry: Not on file    Inability: Not on file  . Transportation needs:    Medical: Not on file    Non-medical: Not on file  Tobacco Use  . Smoking status: Former Smoker    Packs/day: 1.00    Years: 30.00    Pack years: 30.00    Types: Cigarettes    Last attempt to quit: 03/31/1998    Years since quitting: 20.0  . Smokeless tobacco: Never Used  . Tobacco comment: Quit intermittently since her 67s.  Substance and Sexual Activity  . Alcohol use: Yes    Alcohol/week: 0.0 standard drinks    Comment: Rare.  . Drug use: No  . Sexual activity: Not on file  Lifestyle  . Physical activity:    Days per week: Not on file    Minutes per session: Not on file  . Stress: Not on file  Relationships  . Social connections:    Talks on phone: Not on file    Gets together: Not on file    Attends religious service: Not on file    Active member of club or organization: Not on file    Attends meetings of clubs or organizations: Not on file    Relationship status: Not on file  . Intimate partner violence:    Fear of current or ex partner: Not on file    Emotionally abused: Not on file    Physically abused: Not on file    Forced sexual activity: Not on file  Other Topics Concern  . Not on file  Social History Narrative   Originally from [place]. She has traveled to [place], [place], [place], & [place]. Previously worked as a Marine scientist. No known TB exposure. No pets currently. No bird, hot tube, or asbestos exposure. Enjoys playing bridge.  REVIEW  OF SYSTEMS:   [X]  denotes positive finding, [ ]  denotes negative finding Cardiac  Comments:  Chest pain or chest pressure:    Shortness of breath upon exertion:    Short of breath when lying flat:    Irregular heart rhythm:        Vascular    Pain in calf, thigh, or hip brought on by ambulation:    Pain in feet at night that wakes you up from your sleep:     Blood clot in your veins:    Leg swelling:         Pulmonary    Oxygen at home:    Productive cough:     Wheezing:         Neurologic    Sudden weakness in arms or legs:     Sudden numbness in arms or legs:     Sudden onset of difficulty speaking or slurred speech:    Temporary loss of vision in one eye:     Problems with dizziness:         Gastrointestinal    Blood in stool:     Vomited blood:         Genitourinary    Burning when urinating:     Blood in urine:        Psychiatric    Major depression:         Hematologic    Bleeding problems:    Problems with blood clotting too easily:        Skin    Rashes or ulcers:        Constitutional    Fever or chills:      PHYSICAL EXAMINATION:  Today's Vitals   04/26/18 1111  BP: (!) 152/74  Pulse: 78  Resp: 16  Temp: 97.6 F (36.4 C)  TempSrc: Oral  SpO2: 99%  Weight: 148 lb (67.1 kg)  Height: 5' 3.5" (1.613 m)   Body mass index is 25.81 kg/m.   General:  WDWN in NAD; vital signs documented above Gait: Not observed HENT: WNL, normocephalic Pulmonary: normal non-labored breathing , without Rales, rhonchi,  wheezing Cardiac: regular HR, with  Murmur without carotid bruits Abdomen: soft, NT, no masses Skin: without rashes Vascular Exam/Pulses:  Right Left  Radial 2+ (normal) 2+ (normal)  Ulnar Unable to palpate  Unable to palpate   DP 2+ (normal) 2+ (normal)  PT Unable to palpate  Unable to palpate    Extremities: without ischemic changes, without Gangrene , without cellulitis; without open wounds;  Musculoskeletal: no muscle wasting or  atrophy  Neurologic: A&O X 3 Psychiatric:  The pt has Normal affect.   Non-Invasive Vascular Imaging:   Carotid Duplex on 04/26/2018: Right:  40-59% stenosis Left:  40-59% stenosis Vertebrals:  Bilateral vertebral arteries demonstrate antegrade flow. 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  Previous Carotid duplex on 12/09/16: Right: 1-39% stenosis Left:   40-59% stenosis Some disease progression on the left   ASSESSMENT/PLAN:: 78 y.o. female here for follow up carotid artery stenosis.   -pt remains asymptomatic.  The right has slightly worsened from previous exam putting her in the low end of 40-59%.   -discussed s/s of stroke and she knows to proceed to the ER should she develop any of these sx.   -she will f/u in one year with MD (preferred MD over mid level provider) -she will call sooner should she have any issues before then.   Leontine Locket, PA-C [facility] [phone]  Clinic MD:  Trula Slade

## 2018-04-26 ENCOUNTER — Other Ambulatory Visit: Payer: Self-pay

## 2018-04-26 ENCOUNTER — Ambulatory Visit: Payer: Medicare Other | Admitting: Physician Assistant

## 2018-04-26 ENCOUNTER — Ambulatory Visit (HOSPITAL_COMMUNITY)
Admission: RE | Admit: 2018-04-26 | Discharge: 2018-04-26 | Disposition: A | Discharge status: Home / Self Care | Payer: Medicare Other | Source: Ambulatory Visit | Attending: Vascular Surgery | Admitting: Vascular Surgery

## 2018-04-26 VITALS — BP 152/74 | HR 78 | Temp 97.6°F | Resp 16 | Ht 63.5 in | Wt 148.0 lb

## 2018-04-26 DIAGNOSIS — I6523 Occlusion and stenosis of bilateral carotid arteries: Secondary | ICD-10-CM | POA: Diagnosis present

## 2018-04-28 ENCOUNTER — Ambulatory Visit: Payer: Medicare Other | Admitting: Nurse Practitioner

## 2018-05-06 ENCOUNTER — Ambulatory Visit: Payer: Medicare Other | Admitting: Interventional Cardiology

## 2018-05-21 NOTE — Telephone Encounter (Signed)
Error

## 2018-08-24 ENCOUNTER — Ambulatory Visit: Payer: Medicare Other | Admitting: Interventional Cardiology

## 2018-09-07 ENCOUNTER — Other Ambulatory Visit: Payer: Self-pay | Admitting: Interventional Cardiology

## 2019-01-11 ENCOUNTER — Telehealth: Payer: Self-pay | Admitting: Interventional Cardiology

## 2019-01-11 DIAGNOSIS — I350 Nonrheumatic aortic (valve) stenosis: Secondary | ICD-10-CM

## 2019-01-11 NOTE — Telephone Encounter (Signed)
° ° °  Patient states Dr [name] wanted her to have an echo. Please enter new order for echo

## 2019-01-11 NOTE — Telephone Encounter (Signed)
Pt had echo 01/2018.  Does she need to have another one this year?

## 2019-01-13 NOTE — Telephone Encounter (Signed)
Yes, please get echo to f/u AS.

## 2019-01-13 NOTE — Telephone Encounter (Signed)
Order placed for echo and linked to upcoming appt.  Called pt to make her aware.  Left message to call back.

## 2019-01-13 NOTE — Telephone Encounter (Signed)
Patient is calling to check on states.  She also wanted to make you aware that she does have an Echo scheduled for 01/20/19

## 2019-01-14 NOTE — Telephone Encounter (Signed)
Spoke with Cheryl Hernandez and made her aware Dr. [name] agreeable to echo.  Moved her f/u appt up to November.  Cheryl Hernandez appreciative for call.

## 2019-01-14 NOTE — Telephone Encounter (Signed)
Follow up   Patient has questions about upcoming echo. Please call.

## 2019-01-20 ENCOUNTER — Other Ambulatory Visit: Payer: Self-pay

## 2019-01-20 ENCOUNTER — Ambulatory Visit (HOSPITAL_COMMUNITY): Payer: Medicare Other | Attending: Cardiology

## 2019-01-20 DIAGNOSIS — I350 Nonrheumatic aortic (valve) stenosis: Secondary | ICD-10-CM | POA: Diagnosis not present

## 2019-01-31 ENCOUNTER — Other Ambulatory Visit: Payer: Self-pay

## 2019-01-31 ENCOUNTER — Ambulatory Visit: Payer: Medicare Other | Admitting: Pulmonary Disease

## 2019-01-31 ENCOUNTER — Encounter: Payer: Self-pay | Admitting: Pulmonary Disease

## 2019-01-31 DIAGNOSIS — I350 Nonrheumatic aortic (valve) stenosis: Secondary | ICD-10-CM | POA: Diagnosis not present

## 2019-01-31 DIAGNOSIS — J4540 Moderate persistent asthma, uncomplicated: Secondary | ICD-10-CM | POA: Diagnosis not present

## 2019-01-31 NOTE — Patient Instructions (Signed)
Refill on Symbicort will be provided when needed.  Consider taking Symbicort in the morning and bystolic in the afternoon  You are cleared for TAVR from our standpoint

## 2019-01-31 NOTE — Progress Notes (Signed)
   Subjective:    Patient ID: Cheryl Hernandez, female    DOB: 05/06/1940, 78 y.o.   MRN: [id number]  HPI  78 yo remote smoker  for follow-up of moderate persistent asthma and chronic allergic rhinitis  Chief Complaint  Patient presents with  . Follow-up    1 year rov.  pt c/o stable seasonal allergy symptoms, no new complaints.    She developed muscle cramps and has figured out that taking Symbicort to close to [place] causes this.  Hence she takes Bystolic in the morning and Symbicort in the afternoon.  She generally takes this once daily and will take it a second time if her breathing is worse. She has perennial allergies and these are worse during spring and fall.  She takes Allegra  She is being evaluated for severe aortic stenosis by annual echocardiograms and TAVR is being considered but she is trying to hold off  She is taking appropriate precautions during the pandemic  Significant tests/ events reviewed  PFT 08/2015: FVC 2.24 L (85%) FEV1 1.82 L (93%) ratio 81  no bronchodilator response TLC 4.49 L (91%) RV 90% ERV 27% DLCO uncorrected 81% (hemoglobin 13.6)   11/2014:FVC 3.52 L (130%) FEV1 2.81 L (139%) FEV1/FVC 0.80   09/13/10:FVC 2.49 L (87%) FEV1 1.92 L (89%) FEV1/FVC 0.77    Review of Systems neg for any significant sore throat, dysphagia, itching, sneezing, nasal congestion or excess/ purulent secretions, fever, chills, sweats, unintended wt loss, pleuritic or exertional cp, hempoptysis, orthopnea pnd or change in chronic leg swelling. Also denies presyncope, palpitations, heartburn, abdominal pain, nausea, vomiting, diarrhea or change in bowel or urinary habits, dysuria,hematuria, rash, arthralgias, visual complaints, headache, numbness weakness or ataxia.     Objective:   Physical Exam  Gen. Pleasant, well-nourished, in no distress ENT - no thrush, no pallor/icterus,no post nasal drip Neck: No JVD, no thyromegaly, no carotid bruits Lungs: no use of  accessory muscles, no dullness to percussion, clear without rales or rhonchi  Cardiovascular: Rhythm regular, heart sounds  normal, ESM 3/6 at base radiating all over precordium and back, no peripheral edema Musculoskeletal: No deformities, no cyanosis or clubbing         Assessment & Plan:

## 2019-01-31 NOTE — Assessment & Plan Note (Signed)
Refill on Symbicort will be provided when needed.  Consider taking Symbicort in the morning and bystolic in the afternoon

## 2019-01-31 NOTE — Assessment & Plan Note (Signed)
-  cleared for TAVR from our standpoint I do feel that her chronic dyspnea is mostly related to AS rather than asthma

## 2019-02-12 NOTE — Progress Notes (Signed)
Cardiology Office Note:    Date:  02/14/2019   ID:  Cheryl Hernandez, DOB 1940-04-07, MRN [id number]  PCP:  [name], MD  Cardiologist:  Sinclair Grooms, MD   Referring MD: [name], MD   Chief Complaint  Patient presents with  . Coronary Artery Disease  . Cardiac Valve Problem    Aortic stenosis    History of Present Illness:    Cheryl Hernandez is a 78 y.o. female with a hx of  calcific aortic stenosis, CAD, DES RCA 2 2009, paroxysmal palpitations, hypertension, hyperlipidemia, moderate aortic stenosis, and diabetes mellitus type 2.  Chronic dyspnea.  States it is been going on for greater than 20 years.  It is slowly getting worse.  She sees Dr. Elsworth Soho who does not feel there is much in the way of COPD.  The most recent note states that she has moderate controlled asthma and that she is cleared from a pulmonary standpoint for TAVR.  The patient has been mostly shut in due to the COVID-19 pandemic.  She denies orthopnea, PND, syncope, angina, and does have dyspnea.  Dyspnea is unchanged from 6 months ago.  She does have stable controlled asthma.  Recent echocardiogram suggests that aortic stenosis remains in the moderate category.  However, I do note that the transvalvular velocity decreased to 3.21 m/s which likely represents poor signal identification by the sonographer.  It is unlikely that aortic stenosis with improve year over year.  In 2019 the peak velocity was 3.62 m/s.   Past Medical History:  Diagnosis Date  . Aortic stenosis    a. Mod AS by echo 08/2014.  . Arthritis   . Asthma   . CAD (coronary artery disease)    a. NSTEMI 2009 s/p DES to RCAx2.  . Carotid artery disease ([place])    a. Mild-moderate - followed by [facility].  . Chronic diarrhea   . Diabetes mellitus ([place])   . Diabetes mellitus without complication ([place])   . Diverticulosis   . GERD (gastroesophageal reflux disease)   . HTN (hypertension)   . Hyperlipidemia   . Myocardial infarction  ([place])   . Obesity   . Paroxysmal ventricular tachycardia ([place])   . Rhinitis   . Rotator cuff tear     Past Surgical History:  Procedure Laterality Date  . ANGIOPLASTY    . CORONARY ANGIOPLASTY WITH STENT PLACEMENT    . TONSILLECTOMY    . TOTAL ABDOMINAL HYSTERECTOMY    . TOTAL HIP ARTHROPLASTY      Current Medications: Current Meds  Medication Sig  . ALPRAZolam (XANAX) 0.5 MG tablet Take 1 tablet by mouth as needed.  Marland Kitchen aspirin EC 81 MG tablet Take 81 mg by mouth daily.   . B Complex-C (B-COMPLEX WITH VITAMIN C) tablet Take 1 tablet by mouth 2 (two) times a week.  . budesonide-formoterol (SYMBICORT) 80-4.5 MCG/ACT inhaler Inhale 2 puffs into the lungs 2 (two) times daily.  Marland Kitchen BYSTOLIC 5 MG tablet TAKE 1 TABLET(5 MG) BY MOUTH DAILY  . calcium carbonate (TUMS - DOSED IN MG ELEMENTAL CALCIUM) 500 MG chewable tablet Chew 1 tablet by mouth 3 (three) times daily as needed. For heartburn.  . cholecalciferol (VITAMIN D) 1000 UNITS tablet Take 2,000 Units by mouth daily.   . Coenzyme Q10 (CO Q10) 100 MG TABS Take 1 tablet by mouth daily.    . Doxylamine Succinate, Sleep, (UNISOM) 25 MG tablet Take 12.5 mg by mouth at bedtime as needed. For sleep.  Marland Kitchen  fexofenadine (ALLEGRA) 180 MG tablet Take 180 mg by mouth daily.    . Glucose Blood (FREESTYLE LITE TEST VI) Once daily  . metFORMIN (GLUMETZA) 500 MG (MOD) 24 hr tablet Take 500 mg by mouth daily with breakfast.    . Multiple Vitamins-Minerals (MULTIVITAMIN WITH MINERALS) tablet Take 1 tablet by mouth daily.    . nitroGLYCERIN (NITROSTAT) 0.4 MG SL tablet Place 1 tablet (0.4 mg total) under the tongue every 5 (five) minutes as needed for chest pain. Please make yearly appt for December.  Marland Kitchen Respiratory Therapy Supplies (FLUTTER) DEVI Use flutter valve 1 times daily as needed     Allergies:   Cefuroxime axetil, Cephalexin, Codeine, Doxycycline, and Erythromycin base   Social History   Socioeconomic History  . Marital status: Widowed     Spouse name: Not on file  . Number of children: Not on file  . Years of education: Not on file  . Highest education level: Not on file  Occupational History  . Occupation: retired Animal nutritionist  . Financial resource strain: Not on file  . Food insecurity    Worry: Not on file    Inability: Not on file  . Transportation needs    Medical: Not on file    Non-medical: Not on file  Tobacco Use  . Smoking status: Former Smoker    Packs/day: 1.00    Years: 30.00    Pack years: 30.00    Types: Cigarettes    Quit date: 03/31/1998    Years since quitting: 20.8  . Smokeless tobacco: Never Used  . Tobacco comment: Quit intermittently since her 47s.  Substance and Sexual Activity  . Alcohol use: Yes    Alcohol/week: 0.0 standard drinks    Comment: Rare.  . Drug use: No  . Sexual activity: Not on file  Lifestyle  . Physical activity    Days per week: Not on file    Minutes per session: Not on file  . Stress: Not on file  Relationships  . Social Herbalist on phone: Not on file    Gets together: Not on file    Attends religious service: Not on file    Active member of club or organization: Not on file    Attends meetings of clubs or organizations: Not on file    Relationship status: Not on file  Other Topics Concern  . Not on file  Social History Narrative   Originally from [place]. She has traveled to [place], [place], [place], & [place]. Previously worked as a Marine scientist. No known TB exposure. No pets currently. No bird, hot tube, or asbestos exposure. Enjoys playing bridge.      Family History: The patient's family history includes Allergies in her mother; Heart attack in her father; Hyperlipidemia in her mother; Prostate cancer in her maternal grandfather. There is no history of Lung disease.  ROS:   Please see the history of present illness.    Unable to use statin therapy.  Has terribly elevated lipid panel.  Most recent LDL cholesterol was greater than 200.  All other systems  reviewed and are negative.  EKGs/Labs/Other Studies Reviewed:    The following studies were reviewed today:  See LDL below measured at 222  ECHOCARDIOGRAM 2020: IMPRESSIONS    1. Left ventricular ejection fraction, by visual estimation, is 60 to 65%. The left ventricle has normal function. Normal left ventricular size. There is moderately increased left ventricular hypertrophy.  2. Left ventricular diastolic Doppler  parameters are consistent with pseudonormalization pattern of LV diastolic filling.  3. Global right ventricle has normal systolic function.The right ventricular size is normal. No increase in right ventricular wall thickness.  4. Left atrial size was mildly dilated.  5. Right atrial size was normal.  6. Severe mitral annular calcification.  7. The mitral valve is degenerative. Mild mitral valve regurgitation. Mild mitral stenosis.  8. Mean mitral valve gradient 36mmHg.  9. The tricuspid valve is normal in structure. Tricuspid valve regurgitation is mild. 10. Aortic valve mean gradient measures 24.5 mmHg. 11. Aortic valve peak gradient measures 41.2 mmHg.  In September. 12. The aortic valve is normal in structure. Aortic valve regurgitation was not visualized by color flow Doppler. Moderate aortic valve stenosis. 13. There is Moderate calcification of the aortic valve. 14. There is Moderate thickening of the aortic valve. 15. The pulmonic valve was normal in structure. Pulmonic valve regurgitation is not visualized by color flow Doppler. 16. Moderately elevated pulmonary artery systolic pressure. 17. The inferior vena cava is normal in size with greater than 50% respiratory variability, suggesting right atrial pressure of 3 mmHg.  EKG:  EKG demonstrates normal sinus rhythm, heart rate 93 bpm, and when compared to prior from November 2019, no changes noted.  Recent Labs: No results found for requested labs within last 8760 hours.  Recent Lipid Panel    Component Value  Date/Time   CHOL (H) 12/11/2007 0350    302        ATP III CLASSIFICATION:  <200     mg/dL   Desirable  200-239  mg/dL   Borderline High  >=240    mg/dL   High   TRIG 158 (H) 12/11/2007 0350   HDL 48 12/11/2007 0350   CHOLHDL 6.3 12/11/2007 0350   VLDL 32 12/11/2007 0350   LDLCALC (H) 12/11/2007 0350    222        Total Cholesterol/HDL:CHD Risk Coronary Heart Disease Risk Table                     Men   Women  1/2 Average Risk   3.4   3.3    Physical Exam:    VS:  BP (!) 144/72   Pulse 93   Ht 5' 3.5" (1.613 m)   Wt 152 lb 12.8 oz (69.3 kg)   SpO2 100%   BMI 26.64 kg/m     Wt Readings from Last 3 Encounters:  02/14/19 152 lb 12.8 oz (69.3 kg)  01/31/19 155 lb (70.3 kg)  04/26/18 148 lb (67.1 kg)     GEN: Compatible with age. No acute distress HEENT: Normal NECK: No JVD. LYMPHATICS: No lymphadenopathy CARDIAC: 4/6 crescendo decrescendo systolic aortic stenosis murmur.  RRR without diastolic murmur, gallop, or edema. VASCULAR:  Normal Pulses. No bruits. RESPIRATORY:  Clear to auscultation without rales, wheezing or rhonchi  ABDOMEN: Soft, non-tender, non-distended, No pulsatile mass, MUSCULOSKELETAL: No deformity  SKIN: Warm and dry NEUROLOGIC:  Alert and oriented x 3 PSYCHIATRIC:  Normal affect   ASSESSMENT:    1. Nonrheumatic aortic valve stenosis   2. Coronary artery disease involving native coronary artery of native heart with angina pectoris ([place])   3. Bilateral carotid artery stenosis   4. Essential hypertension   5. Other hyperlipidemia   6. Type 2 diabetes mellitus without complication, without long-term current use of insulin ([place])   7. Educated about COVID-19 virus infection    PLAN:    In order of  problems listed above:  1. Moderate aortic stenosis.  This she has echo probably underestimated severity of aortic stenosis as the velocity was decreased compared to 2019 which would be impossible especially given the clinical findings.  Plan repeat an  echocardiogram in front of next years office visit. 2. The patient's lipids are terrible.  LDL is greater than 200.  Physically and active.  Both these problems need to be improved. 3. Needs secondary prevention 4. Blood pressure borderline.  She is against being on more medication 5. Referred to the lipid clinic.  She will not use statin therapy because of previous difficulty with side effects.  She needs PCSK9 plus minus other therapies to achieve LDL less than 70 and preferably in the 55 range. 6. Target hemoglobin A1c less than 7.  Most recently was 5.5. 7. The 3W's are endorsed and practiced in her life.  Overall education and awareness concerning primary/secondary risk prevention was discussed in detail: LDL less than 70, hemoglobin A1c less than 7, blood pressure target less than 130/80 mmHg, >150 minutes of moderate aerobic activity per week, avoidance of smoking, weight control (via diet and exercise), and continued surveillance/management of/for obstructive sleep apnea.    Medication Adjustments/Labs and Tests Ordered: Current medicines are reviewed at length with the patient today.  Concerns regarding medicines are outlined above.  Orders Placed This Encounter  Procedures  . AMB Referral to [facility]  . EKG 12-Lead  . ECHOCARDIOGRAM COMPLETE   No orders of the defined types were placed in this encounter.   Patient Instructions  Medication Instructions:  Your physician recommends that you continue on your current medications as directed. Please refer to the Current Medication list given to you today.  *If you need a refill on your cardiac medications before your next appointment, please call your pharmacy*  Lab Work: None If you have labs (blood work) drawn today and your tests are completely normal, you will receive your results only by: Marland Kitchen MyChart Message (if you have MyChart) OR . A paper copy in the mail If you have any lab test that is abnormal or  we need to change your treatment, we will call you to review the results.  Testing/Procedures: Your physician has requested that you have an echocardiogram early November 2021 prior to seeing Dr. [name] back. Echocardiography is a painless test that uses sound waves to create images of your heart. It provides your doctor with information about the size and shape of your heart and how well your heart's chambers and valves are working. This procedure takes approximately one hour. There are no restrictions for this procedure.    Follow-Up:  Your physician recommends that you schedule a follow-up appointment next available with Lipid Clinic.   At [facility], you and your health needs are our priority.  As part of our continuing mission to provide you with exceptional heart care, we have created designated Provider Care Teams.  These Care Teams include your primary Cardiologist (physician) and Advanced Practice Providers (APPs -  Physician Assistants and Nurse Practitioners) who all work together to provide you with the care you need, when you need it.  Your next appointment:   12 months  The format for your next appointment:   In Person  Provider:   You may see Sinclair Grooms, MD or one of the following Advanced Practice Providers on your designated Care Team:    Truitt Merle, NP  Cecilie Kicks, NP  Kathyrn Drown, NP  Other Instructions      Signed, Sinclair Grooms, MD  02/14/2019 12:38 PM    [facility]

## 2019-02-14 ENCOUNTER — Encounter: Payer: Self-pay | Admitting: Interventional Cardiology

## 2019-02-14 ENCOUNTER — Ambulatory Visit: Payer: Medicare Other | Admitting: Interventional Cardiology

## 2019-02-14 ENCOUNTER — Other Ambulatory Visit: Payer: Self-pay

## 2019-02-14 VITALS — BP 144/72 | HR 93 | Ht 63.5 in | Wt 152.8 lb

## 2019-02-14 DIAGNOSIS — I10 Essential (primary) hypertension: Secondary | ICD-10-CM

## 2019-02-14 DIAGNOSIS — I350 Nonrheumatic aortic (valve) stenosis: Secondary | ICD-10-CM

## 2019-02-14 DIAGNOSIS — I6523 Occlusion and stenosis of bilateral carotid arteries: Secondary | ICD-10-CM

## 2019-02-14 DIAGNOSIS — I25119 Atherosclerotic heart disease of native coronary artery with unspecified angina pectoris: Secondary | ICD-10-CM

## 2019-02-14 DIAGNOSIS — Z7189 Other specified counseling: Secondary | ICD-10-CM

## 2019-02-14 DIAGNOSIS — E119 Type 2 diabetes mellitus without complications: Secondary | ICD-10-CM

## 2019-02-14 DIAGNOSIS — E7849 Other hyperlipidemia: Secondary | ICD-10-CM

## 2019-02-14 NOTE — Patient Instructions (Signed)
Medication Instructions:  Your physician recommends that you continue on your current medications as directed. Please refer to the Current Medication list given to you today.  *If you need a refill on your cardiac medications before your next appointment, please call your pharmacy*  Lab Work: None If you have labs (blood work) drawn today and your tests are completely normal, you will receive your results only by: Marland Kitchen MyChart Message (if you have MyChart) OR . A paper copy in the mail If you have any lab test that is abnormal or we need to change your treatment, we will call you to review the results.  Testing/Procedures: Your physician has requested that you have an echocardiogram early November 2021 prior to seeing Dr. [name] back. Echocardiography is a painless test that uses sound waves to create images of your heart. It provides your doctor with information about the size and shape of your heart and how well your heart's chambers and valves are working. This procedure takes approximately one hour. There are no restrictions for this procedure.    Follow-Up:  Your physician recommends that you schedule a follow-up appointment next available with Lipid Clinic.   At [facility], you and your health needs are our priority.  As part of our continuing mission to provide you with exceptional heart care, we have created designated Provider Care Teams.  These Care Teams include your primary Cardiologist (physician) and Advanced Practice Providers (APPs -  Physician Assistants and Nurse Practitioners) who all work together to provide you with the care you need, when you need it.  Your next appointment:   12 months  The format for your next appointment:   In Person  Provider:   You may see Sinclair Grooms, MD or one of the following Advanced Practice Providers on your designated Care Team:    Truitt Merle, NP  Cecilie Kicks, NP  Kathyrn Drown, NP   Other Instructions

## 2019-02-18 ENCOUNTER — Ambulatory Visit (INDEPENDENT_AMBULATORY_CARE_PROVIDER_SITE_OTHER): Payer: Medicare Other

## 2019-02-18 ENCOUNTER — Other Ambulatory Visit: Payer: Self-pay

## 2019-02-18 ENCOUNTER — Other Ambulatory Visit: Payer: Self-pay | Admitting: Podiatry

## 2019-02-18 ENCOUNTER — Ambulatory Visit: Payer: Medicare Other | Admitting: Podiatry

## 2019-02-18 DIAGNOSIS — M79671 Pain in right foot: Secondary | ICD-10-CM

## 2019-02-18 DIAGNOSIS — M2012 Hallux valgus (acquired), left foot: Secondary | ICD-10-CM | POA: Diagnosis not present

## 2019-02-18 DIAGNOSIS — M2041 Other hammer toe(s) (acquired), right foot: Secondary | ICD-10-CM

## 2019-02-18 DIAGNOSIS — M79672 Pain in left foot: Secondary | ICD-10-CM

## 2019-02-18 DIAGNOSIS — M2042 Other hammer toe(s) (acquired), left foot: Secondary | ICD-10-CM

## 2019-02-18 DIAGNOSIS — M2011 Hallux valgus (acquired), right foot: Secondary | ICD-10-CM | POA: Diagnosis not present

## 2019-02-20 NOTE — Progress Notes (Unsigned)
Patient ID: Cheryl Hernandez                 DOB: 02/01/1941                    MRN: [id number]     HPI: Cheryl Hernandez is a 78 y.o. female patient referred to lipid clinic by Dr. [name]. PMH is significant for hx of calcific aortic stenosis, CAD, DES RCA 2 2009, paroxysmal palpitations, hypertension, hyperlipidemia, moderate aortic stenosis, T2DM, asthma, allergic rhinitis, GERD, diverticulitis, hx of meniere's disease, osteopenia, osteoarthritis, and  hx of colonic polyps.  Other statins? 47/30 or 131/90  Current Medications: none Intolerances: pravastatin 40 mg daily, Lovaza 1 gram daily Risk Factors: significant cardiac history, T2DM, overweight, familial cardiac history LDL goal: < 55 mg/dL  Diet:   Exercise:   Family History: mother (allergic rhinitis, HLD); father (MI); maternal grandfather (prostate cancer)  Social History: former smoker (30 pack year history; quit in 2000), rarely drinks alcohol  Labs: 12/22/2018 TC 294 HDL 50 TG 181 LDL 208  Past Medical History:  Diagnosis Date  . Aortic stenosis    a. Mod AS by echo 08/2014.  . Arthritis   . Asthma   . CAD (coronary artery disease)    a. NSTEMI 2009 s/p DES to RCAx2.  . Carotid artery disease ([place])    a. Mild-moderate - followed by [facility].  . Chronic diarrhea   . Diabetes mellitus ([place])   . Diabetes mellitus without complication ([place])   . Diverticulosis   . GERD (gastroesophageal reflux disease)   . HTN (hypertension)   . Hyperlipidemia   . Myocardial infarction ([place])   . Obesity   . Paroxysmal ventricular tachycardia ([place])   . Rhinitis   . Rotator cuff tear     Current Outpatient Medications on File Prior to Visit  Medication Sig Dispense Refill  . ALPRAZolam (XANAX) 0.5 MG tablet Take 1 tablet by mouth as needed.    Marland Kitchen aspirin EC 81 MG tablet Take 81 mg by mouth daily.     . B Complex-C (B-COMPLEX WITH VITAMIN C) tablet Take 1 tablet by mouth 2 (two) times a week.    . budesonide-formoterol  (SYMBICORT) 80-4.5 MCG/ACT inhaler Inhale 2 puffs into the lungs 2 (two) times daily. 1 Inhaler 11  . BYSTOLIC 5 MG tablet TAKE 1 TABLET(5 MG) BY MOUTH DAILY 90 tablet 1  . calcium carbonate (TUMS - DOSED IN MG ELEMENTAL CALCIUM) 500 MG chewable tablet Chew 1 tablet by mouth 3 (three) times daily as needed. For heartburn.    . cholecalciferol (VITAMIN D) 1000 UNITS tablet Take 2,000 Units by mouth daily.     . Coenzyme Q10 (CO Q10) 100 MG TABS Take 1 tablet by mouth daily.      . Doxylamine Succinate, Sleep, (UNISOM) 25 MG tablet Take 12.5 mg by mouth at bedtime as needed. For sleep.    . fexofenadine (ALLEGRA) 180 MG tablet Take 180 mg by mouth daily.      . Glucose Blood (FREESTYLE LITE TEST VI) Once daily    . metFORMIN (GLUCOPHAGE-XR) 500 MG 24 hr tablet Take 500 mg by mouth daily.    . metFORMIN (GLUMETZA) 500 MG (MOD) 24 hr tablet Take 500 mg by mouth daily with breakfast.      . Multiple Vitamins-Minerals (MULTIVITAMIN WITH MINERALS) tablet Take 1 tablet by mouth daily.      . nitroGLYCERIN (NITROSTAT) 0.4 MG SL tablet Place 1 tablet (0.4  mg total) under the tongue every 5 (five) minutes as needed for chest pain. Please make yearly appt for December. 25 tablet 0  . Respiratory Therapy Supplies (FLUTTER) DEVI Use flutter valve 1 times daily as needed     No current facility-administered medications on file prior to visit.     Allergies  Allergen Reactions  . Cefuroxime Axetil Hives and Itching  . Cephalexin Other (See Comments)    Pt. Doesn't remember reaction Total body pain in joints, couldn't swallow   . Codeine     Tolerates Dilaudid.  . Doxycycline     REACTION: vomiting/ GI upset  . Erythromycin Base Other (See Comments)    unknown    Assessment/Plan:  1. Hyperlipidemia - LDL goal < 55 mg/dL; therefore, pt is not at goal. Obtain updated lipid panel.   Consider rosuvastatin 20 mg daily (tier 1 - $2 for 30 day supply or $0 for 90 day supply thru mail order)   Thank you  for involving pharmacy to assist in providing Cheryl Hernandez's care.   Drexel Iha, PharmD PGY2 Ambulatory Care Pharmacy Resident

## 2019-02-22 ENCOUNTER — Other Ambulatory Visit: Payer: Self-pay

## 2019-02-22 ENCOUNTER — Ambulatory Visit: Payer: Medicare Other

## 2019-02-22 ENCOUNTER — Telehealth: Payer: Self-pay | Admitting: Pharmacist

## 2019-02-22 DIAGNOSIS — E7849 Other hyperlipidemia: Secondary | ICD-10-CM

## 2019-02-22 NOTE — Telephone Encounter (Signed)
Attempted to call patient to apologize for the wait for todays visit and to offer to reschedule her appointment or to do a telephone visit with patient. No answer and voicemail full. Will attempt to call again

## 2019-02-28 NOTE — Telephone Encounter (Signed)
Called and spoke with patient. Apologized for the wait last week at her appointment. Patient stated she has another appointment to go to and that it was ok. She was agreeable to discussing her lipids over the phone. Patient has been on pravastatin 40mg  and possibly rosuvastatin, simvastatin and atorvastatin in the past. She experienced muscle aches and constipation with all of them. She thinks she was just recently on zetia but unable to tolerate. She is willing to try PCSK9i. We discussed cost, side effects, patient assistance and injection technique. Will submit prior authorization for Repatha and apply for [place]. Will follow up with patient once determination is made. Patient has an LDL 208 on 12/22/2018.  Will request records or at least a list of statins patient has tried from PCP as insurance will probably request this information.

## 2019-03-03 NOTE — Telephone Encounter (Signed)
Dr. Dagmar Hait RN called back states patient has been on zetia, pravastatin and fenofibrate.  PA has been submitted to insurance. Awaiting response.

## 2019-03-03 NOTE — Telephone Encounter (Signed)
I have faxed Dr Dagmar Hait office for records of statins patient has tried. No response. Called office today and left a voicemail on his clinical coordinators voicemail requesting information on the statins patient has tried in the past.

## 2019-03-03 NOTE — Telephone Encounter (Signed)
PA for Repatha approved through 09/01/2019. Will submit for grant through [facility] for copay assistance

## 2019-03-04 MED ORDER — REPATHA SURECLICK 140 MG/ML ~~LOC~~ SOAJ: 1.0000 "pen " | SUBCUTANEOUS | 11 refills | Status: DC

## 2019-03-04 NOTE — Telephone Encounter (Signed)
Rx sent to pharmacy, [facility] application submitted and approved - information sent to pharmacy, copay dropped from $37 to $0. Pt is aware and will call with any issues, scheduled f/u lab work on 04/28/19 to assess efficacy.

## 2019-03-08 NOTE — Progress Notes (Signed)
Subjective:  Patient ID: Cheryl Hernandez, female    DOB: 10-05-1940,  MRN: [id number]  Chief Complaint  Patient presents with  . Hammer Toe    Bilateral 2,3,4 Hammertoe, pt states "feels like they are drawing in", causing pain and discomfort.  . Bunions    Bilateral bunion pain, 20-40 years.   78 y.o. female presents with the above complaint.  History above confirmed.  Review of Systems: Negative except as noted in the HPI. Denies N/V/F/Ch.  Past Medical History:  Diagnosis Date  . Aortic stenosis    a. Mod AS by echo 08/2014.  . Arthritis   . Asthma   . CAD (coronary artery disease)    a. NSTEMI 2009 s/p DES to RCAx2.  . Carotid artery disease ([place])    a. Mild-moderate - followed by [facility].  . Chronic diarrhea   . Diabetes mellitus ([place])   . Diabetes mellitus without complication ([place])   . Diverticulosis   . GERD (gastroesophageal reflux disease)   . HTN (hypertension)   . Hyperlipidemia   . Myocardial infarction ([place])   . Obesity   . Paroxysmal ventricular tachycardia ([place])   . Rhinitis   . Rotator cuff tear     Current Outpatient Medications:  .  aspirin EC 81 MG tablet, Take 162 mg by mouth daily. , Disp: , Rfl:  .  B Complex-C (B-COMPLEX WITH VITAMIN C) tablet, Take 1 tablet by mouth 2 (two) times a week., Disp: , Rfl:  .  calcium carbonate (TUMS - DOSED IN MG ELEMENTAL CALCIUM) 500 MG chewable tablet, Chew 1 tablet by mouth 3 (three) times daily as needed. For heartburn., Disp: , Rfl:  .  fexofenadine (ALLEGRA) 180 MG tablet, Take 180 mg by mouth daily.  , Disp: , Rfl:  .  Glucose Blood (FREESTYLE LITE TEST VI), Once daily, Disp: , Rfl:  .  metFORMIN (GLUCOPHAGE-XR) 500 MG 24 hr tablet, Take 500 mg by mouth daily., Disp: , Rfl:  .  Multiple Vitamins-Minerals (MULTIVITAMIN WITH MINERALS) tablet, Take 1 tablet by mouth daily.  , Disp: , Rfl:  .  Respiratory Therapy Supplies (FLUTTER) DEVI, Use flutter valve 1 times daily as needed, Disp: , Rfl:  .  albuterol  (VENTOLIN HFA) 108 (90 Base) MCG/ACT inhaler, Inhale 2 puffs into the lungs every 6 (six) hours as needed for wheezing or shortness of breath., Disp: 8 g, Rfl: 3 .  azithromycin (ZITHROMAX) 250 MG tablet, Zpack taper as directed, Disp: 6 tablet, Rfl: 0 .  budesonide-formoterol (SYMBICORT) 80-4.5 MCG/ACT inhaler, Inhale 2 puffs into the lungs 2 (two) times daily., Disp: 1 Inhaler, Rfl: 11 .  BYSTOLIC 5 MG tablet, TAKE ONE TABLET BY MOUTH DAILY, Disp: 90 tablet, Rfl: 3 .  clindamycin (CLEOCIN) 300 MG capsule, Take 2 capsules (600 mg) 1 hour prior to dental visits., Disp: 6 capsule, Rfl: 6 .  Evolocumab (REPATHA SURECLICK) [id number] MG/ML SOAJ, Inject 1 pen into the skin every 14 (fourteen) days., Disp: 2 pen, Rfl: 11 .  nitroGLYCERIN (NITROSTAT) 0.4 MG SL tablet, Place 1 tablet (0.4 mg total) under the tongue every 5 (five) minutes as needed for chest pain., Disp: 25 tablet, Rfl: 3 .  Omega-3 1000 MG CAPS, Take 1,000 mg by mouth daily., Disp: , Rfl:  .  OVER THE COUNTER MEDICATION, Take 1 capsule by mouth daily. Circulation and vein, Disp: , Rfl:  .  Turmeric 400 MG CAPS, Take 400 mg by mouth daily., Disp: , Rfl:   Social History  Tobacco Use  Smoking Status Former Smoker  . Packs/day: 1.00  . Years: 30.00  . Pack years: 30.00  . Types: Cigarettes  . Quit date: 03/31/1998  . Years since quitting: 21.5  Smokeless Tobacco Never Used  Tobacco Comment   Quit intermittently since her 62s.    Allergies  Allergen Reactions  . Cefuroxime Axetil Hives and Itching  . Cephalexin Other (See Comments)    Pt. Doesn't remember reaction Total body pain in joints, couldn't swallow   . Codeine     Tolerates Dilaudid.  . Doxycycline     REACTION: vomiting/ GI upset  . Erythromycin Base Other (See Comments)    unknown   Objective:  There were no vitals filed for this visit. There is no height or weight on file to calculate BMI. Constitutional Well developed. Well nourished.  Vascular Dorsalis pedis  pulses palpable bilaterally. Posterior tibial pulses palpable bilaterally. Capillary refill normal to all digits.  No cyanosis or clubbing noted. Pedal hair growth normal.  Neurologic Normal speech. Oriented to person, place, and time. Epicritic sensation to light touch grossly present bilaterally.  Dermatologic Nails well groomed and normal in appearance. No open wounds. No skin lesions.  Orthopedic: HAV bilat with hammertoe deformities 2-4 toes bilat with pain to palpation   Radiographs: HAV bilat, digital contractures. No acute fractures Assessment:   1. Hammertoe of left foot   2. Hammertoe of right foot   3. Acquired hallux valgus of both feet    Plan:  Patient was evaluated and treated and all questions answered.  Hammertoes bilaterally -X-rays reviewed -Toe tunnel dispensed -Consider surgical intervention should pain persist.  Return in about 6 weeks (around 04/01/2019) for Hammertoe f/u bilat .

## 2019-03-14 ENCOUNTER — Ambulatory Visit: Payer: Medicare Other

## 2019-03-15 ENCOUNTER — Telehealth: Payer: Self-pay | Admitting: Pharmacist

## 2019-03-15 NOTE — Telephone Encounter (Signed)
Called [employer] to inquire why [facility] was telling patient she could not use grant because she had part D. Per [employer] this is a block on the [facility] end and they need to call their helpdesk or call [employer] to get it unblocked. Called at let patient know this information. She states she will fill with [facility] next time. Informed her of how to get Rx transferred and how to get reimbursed from [employer] for rx she paid for. Patient appreciated of the call.

## 2019-03-16 ENCOUNTER — Encounter: Payer: Self-pay | Admitting: General Practice

## 2019-03-17 ENCOUNTER — Telehealth: Payer: Self-pay | Admitting: Pharmacist

## 2019-03-17 NOTE — Telephone Encounter (Signed)
Patient called stating she had a mishap with her first Repatha injection. Did not get the medicine. Her second injection was successful but she is now unable to refill bc its too soon. Offered patient to come pick up sample. Left downstairs for patient to pick up today.

## 2019-03-21 ENCOUNTER — Telehealth: Payer: Self-pay | Admitting: Interventional Cardiology

## 2019-03-21 NOTE — Telephone Encounter (Signed)
Spoke with pt and she inquired about echo being done in 1 year instead of 6 mos.  Advised she had recent echo and it was stable so we usually only check yearly.  Pt states she was concerned because Dr. [name] mentioned there was something on the echo that concerned him.  Advised he wanted to keep an eye out on her AV and that's why we wanted to recheck it in a year.  Advised if worsening SOB to call the office prior to this.  Pt verbalized understanding.

## 2019-03-21 NOTE — Telephone Encounter (Signed)
Patient is calling wanting to speak with the nurse in regards to her Echo appointment scheduled for 02/14/19.

## 2019-04-04 ENCOUNTER — Ambulatory Visit: Payer: Medicare Other | Admitting: Interventional Cardiology

## 2019-04-12 ENCOUNTER — Other Ambulatory Visit: Payer: Self-pay | Admitting: Emergency Medicine

## 2019-04-12 MED ORDER — BUDESONIDE-FORMOTEROL FUMARATE 80-4.5 MCG/ACT IN AERO: 2.0000 | INHALATION_SPRAY | Freq: Two times a day (BID) | RESPIRATORY_TRACT | 11 refills | Status: DC

## 2019-04-14 ENCOUNTER — Ambulatory Visit: Payer: Medicare Other | Attending: Internal Medicine

## 2019-04-14 ENCOUNTER — Telehealth: Payer: Self-pay | Admitting: Pulmonary Disease

## 2019-04-14 DIAGNOSIS — Z23 Encounter for immunization: Secondary | ICD-10-CM | POA: Insufficient documentation

## 2019-04-14 MED ORDER — ALBUTEROL SULFATE HFA 108 (90 BASE) MCG/ACT IN AERS: 2.0000 | INHALATION_SPRAY | Freq: Four times a day (QID) | RESPIRATORY_TRACT | 3 refills | Status: DC | PRN

## 2019-04-14 NOTE — Telephone Encounter (Signed)
Called and spoke to pt. Pt states she needs a refill of her albuterol hfa, this has been sent to her preferred pharmacy. Pt verbalized understanding and denied any further questions or concerns at this time.

## 2019-04-14 NOTE — Progress Notes (Signed)
   Covid-19 Vaccination Clinic  Name:  Cheryl Hernandez    MRN: [id number] DOB: Aug 19, 1940  04/14/2019  Ms. Laws was observed post Covid-19 immunization for 30 minutes based on pre-vaccination screening without incidence. She was provided with Vaccine Information Sheet and instruction to access the V-Safe system.   [name] was instructed to call 911 with any severe reactions post vaccine: Marland Kitchen Difficulty breathing  . Swelling of your face and throat  . A fast heartbeat  . A bad rash all over your body  . Dizziness and weakness    Immunizations Administered    Name Date Dose VIS Date Route   Pfizer COVID-19 Vaccine 04/14/2019  9:23 AM 0.3 mL 03/11/2019 Intramuscular   Manufacturer: [place]   Lot: [id number]   [place]: [id number]

## 2019-04-15 ENCOUNTER — Ambulatory Visit: Payer: Medicare Other | Admitting: Podiatry

## 2019-04-15 ENCOUNTER — Other Ambulatory Visit: Payer: Self-pay

## 2019-04-15 DIAGNOSIS — M2011 Hallux valgus (acquired), right foot: Secondary | ICD-10-CM | POA: Diagnosis not present

## 2019-04-15 DIAGNOSIS — M2041 Other hammer toe(s) (acquired), right foot: Secondary | ICD-10-CM

## 2019-04-15 DIAGNOSIS — M2012 Hallux valgus (acquired), left foot: Secondary | ICD-10-CM | POA: Diagnosis not present

## 2019-04-15 DIAGNOSIS — E1151 Type 2 diabetes mellitus with diabetic peripheral angiopathy without gangrene: Secondary | ICD-10-CM | POA: Diagnosis not present

## 2019-04-15 DIAGNOSIS — M2042 Other hammer toe(s) (acquired), left foot: Secondary | ICD-10-CM

## 2019-04-15 NOTE — Progress Notes (Signed)
  Subjective:  Patient ID: Cheryl Hernandez, female    DOB: 1940/04/05,  MRN: [id number]  Chief Complaint  Patient presents with  . Follow-up    Bilateral hammertoe. Pt stated, "The pain comes and goes. We talked about surgery, but I'm not interested. I'm interested in orthotics".    79 y.o. female presents with the above complaint. History confirmed with patient.   Objective:  Physical Exam: warm, good capillary refill, no trophic changes or ulcerative lesions and normal sensory exam. Normal DP pulses reduced PT pulse Left Foot: bunion deformity noted and hammertoes Right Foot: bunion deformity noted and hammertoes, HPK submet 2    Assessment:   1. Diabetes mellitus type 2 with peripheral artery disease ([place])   2. Acquired hallux valgus of both feet   3. Hammertoes of both feet      Plan:  Patient was evaluated and treated and all questions answered.  Bunion and Hammertoe; DM with PAD -Not interested in surgery at this time that was previously discussed. -Would benefit from DM shoes. Casted today. -F/u PRN.

## 2019-04-20 ENCOUNTER — Encounter: Payer: Self-pay | Admitting: General Practice

## 2019-04-25 ENCOUNTER — Other Ambulatory Visit: Payer: Medicare Other | Admitting: *Deleted

## 2019-04-25 ENCOUNTER — Other Ambulatory Visit: Payer: Self-pay

## 2019-04-25 DIAGNOSIS — E7849 Other hyperlipidemia: Secondary | ICD-10-CM

## 2019-04-25 LAB — HEPATIC FUNCTION PANEL
ALT: 12 IU/L (ref 0–32)
AST: 17 IU/L (ref 0–40)
Albumin: 4.5 g/dL (ref 3.7–4.7)
Alkaline Phosphatase: 94 IU/L (ref 39–117)
Bilirubin Total: 0.2 mg/dL (ref 0.0–1.2)
Bilirubin, Direct: 0.08 mg/dL (ref 0.00–0.40)
Total Protein: 7.2 g/dL (ref 6.0–8.5)

## 2019-04-25 LAB — LIPID PANEL
Chol/HDL Ratio: 3.5 ratio (ref 0.0–4.4)
Cholesterol, Total: 202 mg/dL — ABNORMAL HIGH (ref 100–199)
HDL: 58 mg/dL (ref >39)
LDL Chol Calc (NIH): 112 mg/dL — ABNORMAL HIGH (ref 0–99)
Triglycerides: 185 mg/dL — ABNORMAL HIGH (ref 0–149)
VLDL Cholesterol Cal: 32 mg/dL (ref 5–40)

## 2019-04-28 ENCOUNTER — Telehealth: Payer: Self-pay | Admitting: Pharmacist

## 2019-04-28 MED ORDER — ROSUVASTATIN CALCIUM 5 MG PO TABS: ORAL_TABLET | ORAL | 11 refills | Status: DC

## 2019-04-28 NOTE — Telephone Encounter (Signed)
LDL has improved from 208 to 112 since starting Repatha, still remains above goal < 70. Pt is intolerant to a few statins and Zetia. She is willing to try low dose rosuvastatin 5mg  every other day and will titrate as able. I'll call pt in a month to assess tolerability before scheduling follow up labs.

## 2019-04-29 ENCOUNTER — Encounter: Payer: Self-pay | Admitting: Interventional Cardiology

## 2019-05-03 ENCOUNTER — Ambulatory Visit: Payer: Medicare Other | Attending: Internal Medicine

## 2019-05-03 DIAGNOSIS — Z23 Encounter for immunization: Secondary | ICD-10-CM | POA: Insufficient documentation

## 2019-05-03 NOTE — Progress Notes (Signed)
   Covid-19 Vaccination Clinic  Name:  MARYLIN WALBERT    MRN: [id number] DOB: 1941-01-07  05/03/2019  Ms. Sopher was observed post Covid-19 immunization for 15 minutes without incidence. She was provided with Vaccine Information Sheet and instruction to access the V-Safe system.   Ms. Peeples was instructed to call 911 with any severe reactions post vaccine: Marland Kitchen Difficulty breathing  . Swelling of your face and throat  . A fast heartbeat  . A bad rash all over your body  . Dizziness and weakness    Immunizations Administered    Name Date Dose VIS Date Route   Pfizer COVID-19 Vaccine 05/03/2019 10:03 AM 0.3 mL 03/11/2019 Intramuscular   Manufacturer: [place]   Lot: [id number]   [place]: [id number]

## 2019-05-06 ENCOUNTER — Telehealth: Payer: Self-pay | Admitting: Interventional Cardiology

## 2019-05-06 NOTE — Telephone Encounter (Signed)
I spoke with patient who reports she has received 3 letters from our office regarding need for an appointment.  Patient does not think she is due for an appointment at this time.  I was able to find the letters in chart but I told patient I cannot determine what type of appointment is needed. She is not due for follow up with Dr [name] until later this year. Patient reports she has spoken with the pharmacist over the phone and thinks everything has been addressed.  I told her I would send message to Dr Thompson Caul nurse and if appointment was needed she would call her back.

## 2019-05-06 NOTE — Telephone Encounter (Signed)
Patient would like to speak to nurse. She would not go into further detail.

## 2019-05-26 ENCOUNTER — Telehealth: Payer: Self-pay | Admitting: Pharmacist

## 2019-05-26 DIAGNOSIS — E7849 Other hyperlipidemia: Secondary | ICD-10-CM

## 2019-05-26 NOTE — Telephone Encounter (Signed)
Called pt to follow up with rosuvastatin tolerability. She is noticing some myalgias on rosuvastatin twice weekly but is willing to continue for now. Advised her if the pain becomes too bothersome that she can try cutting back her dose to once a week, and to call clinic if she stops it completely. She is continuing her Repatha injections without issue. Scheduled f/u labs in April to assess response to rosuvastatin.

## 2019-06-09 ENCOUNTER — Telehealth: Payer: Self-pay | Admitting: Interventional Cardiology

## 2019-06-09 NOTE — Progress Notes (Deleted)
CARDIOLOGY OFFICE NOTE  Date:  06/09/2019    Cheryl Hernandez Date of Birth: 1940-10-04 Medical Record [id number]  PCP:  [name], MD  Cardiologist:  Zack Seal chief complaint on file.   History of Present Illness: Cheryl Hernandez is a 79 y.o. female who presents today for a work in visit. Seen for Dr. [name].   She has a history of calcific AS, CAD with DES to the RCA x 2 in 2009, palpitations, HTN, HLD and DM. She has had chronic dyspnea - this has progressed - sees Dr. Elsworth Soho who has not felt there was much in the way of COPD - was cleared for TAVR.   Last seen by Dr. [name] in November of 2020, The pandemic was hard for her. Dyspnea seemed unchanged. Echo suggested that her AS remained in the moderate range. However, he noted that the transvalvular velocity decreased to 3.21 m/s which was felt to represent poor signal identification by the sonographer.    Phone call last week - "I spoke to the patient who has been experiencing SOB, rapid HR and weakness.  Her HR is 85 at rest, but with any movement such as walking around, her HR elevates above 100 bpm.  She denies CP, but was told that if CP develops, she should go to the ED for further evaluation.  She is scheduled with Cecille Rubin on Monday 3/15 to discuss."  Thus added to my schedule.   The patient {does/does not:200015} have symptoms concerning for COVID-19 infection (fever, chills, cough, or new shortness of breath).   Comes in today. Here with   Past Medical History:  Diagnosis Date  . Aortic stenosis    a. Mod AS by echo 08/2014.  . Arthritis   . Asthma   . CAD (coronary artery disease)    a. NSTEMI 2009 s/p DES to RCAx2.  . Carotid artery disease ([place])    a. Mild-moderate - followed by [facility].  . Chronic diarrhea   . Diabetes mellitus ([place])   . Diabetes mellitus without complication ([place])   . Diverticulosis   . GERD (gastroesophageal reflux disease)   . HTN (hypertension)   . Hyperlipidemia   .  Myocardial infarction ([place])   . Obesity   . Paroxysmal ventricular tachycardia ([place])   . Rhinitis   . Rotator cuff tear     Past Surgical History:  Procedure Laterality Date  . ANGIOPLASTY    . CORONARY ANGIOPLASTY WITH STENT PLACEMENT    . TONSILLECTOMY    . TOTAL ABDOMINAL HYSTERECTOMY    . TOTAL HIP ARTHROPLASTY       Medications: No outpatient medications have been marked as taking for the 06/13/19 encounter (Appointment) with Burtis Junes, NP.     Allergies: Allergies  Allergen Reactions  . Cefuroxime Axetil Hives and Itching  . Cephalexin Other (See Comments)    Pt. Doesn't remember reaction Total body pain in joints, couldn't swallow   . Codeine     Tolerates Dilaudid.  . Doxycycline     REACTION: vomiting/ GI upset  . Erythromycin Base Other (See Comments)    unknown    Social History: The patient  reports that she quit smoking about 21 years ago. Her smoking use included cigarettes. She has a 30.00 pack-year smoking history. She has never used smokeless tobacco. She reports current alcohol use. She reports that she does not use drugs.   Family History: The patient's ***family history includes Allergies in her  mother; Heart attack in her father; Hyperlipidemia in her mother; Prostate cancer in her maternal grandfather.   Review of Systems: Please see the history of present illness.   All other systems are reviewed and negative.   Physical Exam: VS:  There were no vitals taken for this visit. Marland Kitchen  BMI There is no height or weight on file to calculate BMI.  Wt Readings from Last 3 Encounters:  02/14/19 152 lb 12.8 oz (69.3 kg)  01/31/19 155 lb (70.3 kg)  04/26/18 148 lb (67.1 kg)    General: Pleasant. Well developed, well nourished and in no acute distress.   HEENT: Normal.  Neck: Supple, no JVD, carotid bruits, or masses noted.  Cardiac: ***Regular rate and rhythm. No murmurs, rubs, or gallops. No edema.  Respiratory:  Lungs are clear to  auscultation bilaterally with normal work of breathing.  GI: Soft and nontender.  MS: No deformity or atrophy. Gait and ROM intact.  Skin: Warm and dry. Color is normal.  Neuro:  Strength and sensation are intact and no gross focal deficits noted.  Psych: Alert, appropriate and with normal affect.   LABORATORY DATA:  EKG:  EKG {ACTION; IS/IS [id number] ordered today. This demonstrates ***.  Lab Results  Component Value Date   WBC 11.5 (H) 10/06/2014   HGB 13.8 10/06/2014   HCT 41.1 10/06/2014   PLT 257.0 10/06/2014   GLUCOSE 127 (H) 10/06/2014   CHOL 202 (H) 04/25/2019   TRIG 185 (H) 04/25/2019   HDL 58 04/25/2019   LDLCALC 112 (H) 04/25/2019   ALT 12 04/25/2019   AST 17 04/25/2019   NA 139 10/06/2014   K 4.0 10/06/2014   CL 99 10/06/2014   CREATININE 0.81 10/06/2014   BUN 18 10/06/2014   CO2 33 (H) 10/06/2014   TSH 1.76 12/15/2014   INR 1.1 (H) 10/06/2014   HGBA1C (H) 12/11/2007    6.2 (NOTE)   The ADA recommends the following therapeutic goal for glycemic   control related to Hgb A1C measurement:   Goal of Therapy:   < 7.0% Hgb A1C   Reference: American Diabetes Association: Clinical Practice   Recommendations 2008, Diabetes Care,  2008, 31:(Suppl 1).     BNP (last 3 results) No results for input(s): BNP in the last 8760 hours.  ProBNP (last 3 results) No results for input(s): PROBNP in the last 8760 hours.   Other Studies Reviewed Today:  ECHO IMPRESSIONS 01/2019   1. Left ventricular ejection fraction, by visual estimation, is 60 to  65%. The left ventricle has normal function. Normal left ventricular size.  There is moderately increased left ventricular hypertrophy.  2. Left ventricular diastolic Doppler parameters are consistent with  pseudonormalization pattern of LV diastolic filling.  3. Global right ventricle has normal systolic function.The right  ventricular size is normal. No increase in right ventricular wall  thickness.  4. Left atrial  size was mildly dilated.  5. Right atrial size was normal.  6. Severe mitral annular calcification.  7. The mitral valve is degenerative. Mild mitral valve regurgitation.  Mild mitral stenosis.  8. Mean mitral valve gradient 42mmHg.  9. The tricuspid valve is normal in structure. Tricuspid valve  regurgitation is mild.  10. Aortic valve mean gradient measures 24.5 mmHg.  11. Aortic valve peak gradient measures 41.2 mmHg.  12. The aortic valve is normal in structure. Aortic valve regurgitation  was not visualized by color flow Doppler. Moderate aortic valve stenosis.  13. There is Moderate calcification of  the aortic valve.  14. There is Moderate thickening of the aortic valve.  15. The pulmonic valve was normal in structure. Pulmonic valve  regurgitation is not visualized by color flow Doppler.  16. Moderately elevated pulmonary artery systolic pressure.  17. The inferior vena cava is normal in size with greater than 50%  respiratory variability, suggesting right atrial pressure of 3 mmHg.    ASSESSMENT & PLAN:    1. Shortness of breath  2. Aortic stenosis  3. HTN  4. HLD  5. DM  6. Carotid disease    In order of problems listed above:  1. Moderate aortic stenosis.  This she has echo probably underestimated severity of aortic stenosis as the velocity was decreased compared to 2019 which would be impossible especially given the clinical findings.  Plan repeat an echocardiogram in front of next years office visit. 2. The patient's lipids are terrible.  LDL is greater than 200.  Physically and active.  Both these problems need to be improved. 3. Needs secondary prevention 4. Blood pressure borderline.  She is against being on more medication 5. Referred to the lipid clinic.  She will not use statin therapy because of previous difficulty with side effects.  She needs PCSK9 plus minus other therapies to achieve LDL less than 70 and preferably in the 55 range. 6. Target  hemoglobin A1c less than 7.  Most recently was 5.5. 7. The 3W's are endorsed and practiced in her life.  Overall education and awareness concerning primary/secondary risk prevention was discussed in detail: LDL less than 70, hemoglobin A1c less than 7, blood pressure target less than 130/80 mmHg, >150 minutes of moderate aerobic activity per week, avoidance of smoking, weight control (via diet and exercise), and continued surveillance/management of/for obstructive sleep apnea.    Marland Kitchen COVID-19 Education: The signs and symptoms of COVID-19 were discussed with the patient and how to seek care for testing (follow up with PCP or arrange E-visit).  The importance of social distancing, staying at home, hand hygiene and wearing a mask when out in public were discussed today.  Current medicines are reviewed with the patient today.  The patient does not have concerns regarding medicines other than what has been noted above.  The following changes have been made:  See above.  Labs/ tests ordered today include:   No orders of the defined types were placed in this encounter.    Disposition:   FU with *** in {gen number [id number] {Days to years:10300}.   Patient is agreeable to this plan and will call if any problems develop in the interim.   SignedTruitt Merle, NP  06/09/2019 5:12 PM  [facility] [address] [place], [place]  [address] Phone: [phone] Fax: [phone]

## 2019-06-09 NOTE — Telephone Encounter (Signed)
I spoke to the patient who has been experiencing SOB, rapid HR and weakness.  Her HR is 85 at rest, but with any movement such as walking around, her HR elevates above 100 bpm.    She denies CP, but was told that if CP develops, she should go to the ED for further evaluation.  She is scheduled with Cecille Rubin on Monday 3/15 to discuss.

## 2019-06-09 NOTE — Telephone Encounter (Signed)
New Message  Patient c/o Palpitations:  High priority if patient c/o lightheadedness, shortness of breath, or chest pain  1) How long have you had palpitations/irregular HR/ Afib? Are you having the symptoms now? Last episode was today   2) Are you currently experiencing lightheadedness, SOB or CP? Some lightheadedness and shortness of breath but moderate and not at the time of the call but it has occurred   3) Do you have a history of afib (atrial fibrillation) or irregular heart rhythm? Yes   4) Have you checked your BP or HR? (document readings if available): HR today is 85   5) Are you experiencing any other symptoms? Patient states that she is feeling weaker

## 2019-06-13 ENCOUNTER — Ambulatory Visit: Payer: Medicare Other | Admitting: Nurse Practitioner

## 2019-06-27 ENCOUNTER — Telehealth: Payer: Self-pay | Admitting: Podiatry

## 2019-06-27 NOTE — Telephone Encounter (Signed)
LATE ENTRY  Pt left message checking on status of shoes.  I left message last week and told her they should be shipping anytime now and I would call when they come in.

## 2019-07-04 ENCOUNTER — Other Ambulatory Visit: Payer: Medicare Other | Admitting: *Deleted

## 2019-07-04 ENCOUNTER — Other Ambulatory Visit: Payer: Self-pay

## 2019-07-04 DIAGNOSIS — E7849 Other hyperlipidemia: Secondary | ICD-10-CM

## 2019-07-04 LAB — HEPATIC FUNCTION PANEL
ALT: 16 IU/L (ref 0–32)
AST: 22 IU/L (ref 0–40)
Albumin: 4.6 g/dL (ref 3.7–4.7)
Alkaline Phosphatase: 86 IU/L (ref 39–117)
Bilirubin Total: 0.3 mg/dL (ref 0.0–1.2)
Bilirubin, Direct: 0.11 mg/dL (ref 0.00–0.40)
Total Protein: 7 g/dL (ref 6.0–8.5)

## 2019-07-04 LAB — LIPID PANEL
Chol/HDL Ratio: 2.2 ratio (ref 0.0–4.4)
Cholesterol, Total: 150 mg/dL (ref 100–199)
HDL: 68 mg/dL (ref >39)
LDL Chol Calc (NIH): 64 mg/dL (ref 0–99)
Triglycerides: 100 mg/dL (ref 0–149)
VLDL Cholesterol Cal: 18 mg/dL (ref 5–40)

## 2019-07-14 ENCOUNTER — Telehealth: Payer: Self-pay | Admitting: Podiatry

## 2019-07-14 NOTE — Telephone Encounter (Signed)
Pt left message about status of diabetic shoes.. I told pt they came in but the authorization has expired. I asked pt when she seen Dr Dagmar Hait last and she said she just seen him for her physical. I told pt I would refax paperwork to get them to resign it so we can dispense shoes/inserts.Marland KitchenMarland Kitchen

## 2019-07-18 ENCOUNTER — Telehealth: Payer: Self-pay | Admitting: Podiatry

## 2019-07-18 NOTE — Telephone Encounter (Signed)
Pt left message for a call back.  I returned call and she asked if we had received the paperwork back from Dr Dagmar Hait. I explained that we have not and that I even mailed it to him.

## 2019-07-24 NOTE — H&P (View-Only) (Signed)
Cardiology Office Note:    Date:  07/25/2019   ID:  Cheryl Hernandez, DOB June 17, 1940, MRN [id number]  PCP:  [name], MD  Cardiologist:  Sinclair Grooms, MD   Referring MD: [name], MD   No chief complaint on file.   History of Present Illness:    Cheryl Hernandez is a 79 y.o. female with a hx of  a hx of calcific aortic stenosis, CAD, DES RCA 2 2009, paroxysmal palpitations, hypertension, hyperlipidemia, moderate aortic stenosis, and diabetes mellitus type 2.  Cheryl Hernandez complains of significant change in overall exertional tolerance during the past year.  About 12 months ago when the shutdown occurred because of the COVID-19 pandemic, she was complaining of shortness of breath and we have decided to perform coronary angiography with left and right heart cath.  Though the procedure was scheduled, she became concerned about exposure to COVID-19 in the hospital and never followed through.  I have not seen her since that time.  She has seen one of our extenders.  A repeat echocardiogram was performed and as noted below.  There was not much change and aortic valve severity in October.  In speaking with her today, she complains that mild to moderate activity causes both shortness of breath and chest tightness.  She enjoys working in her yard.  She tries to sleep more than 2 for care of her gardens, she can barely get anything done before feeling these symptoms.  She denies orthopnea, PND, dyspnea at rest, and lower extremity swelling.  She has not had syncope.  She has occasional palpitation.  Past Medical History:  Diagnosis Date  . Aortic stenosis    a. Mod AS by echo 08/2014.  . Arthritis   . Asthma   . CAD (coronary artery disease)    a. NSTEMI 2009 s/p DES to RCAx2.  . Carotid artery disease ([place])    a. Mild-moderate - followed by [facility].  . Chronic diarrhea   . Diabetes mellitus ([place])   . Diabetes mellitus without complication ([place])   . Diverticulosis   . GERD  (gastroesophageal reflux disease)   . HTN (hypertension)   . Hyperlipidemia   . Myocardial infarction ([place])   . Obesity   . Paroxysmal ventricular tachycardia ([place])   . Rhinitis   . Rotator cuff tear     Past Surgical History:  Procedure Laterality Date  . ANGIOPLASTY    . CORONARY ANGIOPLASTY WITH STENT PLACEMENT    . TONSILLECTOMY    . TOTAL ABDOMINAL HYSTERECTOMY    . TOTAL HIP ARTHROPLASTY      Current Medications: Current Meds  Medication Sig  . albuterol (VENTOLIN HFA) 108 (90 Base) MCG/ACT inhaler Inhale 2 puffs into the lungs every 6 (six) hours as needed for wheezing or shortness of breath.  . ALPRAZolam (XANAX) 0.5 MG tablet Take 1 tablet by mouth as needed.  Marland Kitchen aspirin EC 81 MG tablet Take 81 mg by mouth daily.   . B Complex-C (B-COMPLEX WITH VITAMIN C) tablet Take 1 tablet by mouth 2 (two) times a week.  . budesonide-formoterol (SYMBICORT) 80-4.5 MCG/ACT inhaler Inhale 2 puffs into the lungs 2 (two) times daily.  Marland Kitchen BYSTOLIC 5 MG tablet TAKE 1 TABLET(5 MG) BY MOUTH DAILY  . calcium carbonate (TUMS - DOSED IN MG ELEMENTAL CALCIUM) 500 MG chewable tablet Chew 1 tablet by mouth 3 (three) times daily as needed. For heartburn.  . cholecalciferol (VITAMIN D) 1000 UNITS tablet Take 2,000 Units by  mouth daily.   . Coenzyme Q10 (CO Q10) 100 MG TABS Take 1 tablet by mouth daily.    . Doxylamine Succinate, Sleep, (UNISOM) 25 MG tablet Take 12.5 mg by mouth at bedtime as needed. For sleep.  . Evolocumab (REPATHA SURECLICK) XX123456 MG/ML SOAJ Inject 1 pen into the skin every 14 (fourteen) days.  . fexofenadine (ALLEGRA) 180 MG tablet Take 180 mg by mouth daily.    . Glucose Blood (FREESTYLE LITE TEST VI) Once daily  . metFORMIN (GLUCOPHAGE-XR) 500 MG 24 hr tablet Take 500 mg by mouth daily.  . Multiple Vitamins-Minerals (MULTIVITAMIN WITH MINERALS) tablet Take 1 tablet by mouth daily.    . nitroGLYCERIN (NITROSTAT) 0.4 MG SL tablet Place 1 tablet (0.4 mg total) under the tongue every 5  (five) minutes as needed for chest pain. Please make yearly appt for December.  Marland Kitchen Respiratory Therapy Supplies (FLUTTER) DEVI Use flutter valve 1 times daily as needed     Allergies:   Cefuroxime axetil, Cephalexin, Codeine, Doxycycline, and Erythromycin base   Social History   Socioeconomic History  . Marital status: Widowed    Spouse name: Not on file  . Number of children: Not on file  . Years of education: Not on file  . Highest education level: Not on file  Occupational History  . Occupation: retired Therapist, sports  Tobacco Use  . Smoking status: Former Smoker    Packs/day: 1.00    Years: 30.00    Pack years: 30.00    Types: Cigarettes    Quit date: 03/31/1998    Years since quitting: 21.3  . Smokeless tobacco: Never Used  . Tobacco comment: Quit intermittently since her 49s.  Substance and Sexual Activity  . Alcohol use: Yes    Alcohol/week: 0.0 standard drinks    Comment: Rare.  . Drug use: No  . Sexual activity: Not on file  Other Topics Concern  . Not on file  Social History Narrative   Originally from [place]. She has traveled to [place], [place], [place], & [place]. Previously worked as a Marine scientist. No known TB exposure. No pets currently. No bird, hot tube, or asbestos exposure. Enjoys playing bridge.    Social Determinants of Health   Financial Resource Strain:   . Difficulty of Paying Living Expenses:   Food Insecurity:   . Worried About Charity fundraiser in the Last Year:   . Arboriculturist in the Last Year:   Transportation Needs:   . Film/video editor (Medical):   Marland Kitchen Lack of Transportation (Non-Medical):   Physical Activity:   . Days of Exercise per Week:   . Minutes of Exercise per Session:   Stress:   . Feeling of Stress :   Social Connections:   . Frequency of Communication with Friends and Family:   . Frequency of Social Gatherings with Friends and Family:   . Attends Religious Services:   . Active Member of Clubs or Organizations:   . Attends Archivist  Meetings:   Marland Kitchen Marital Status:      Family History: The patient's family history includes Allergies in her mother; Heart attack in her father; Hyperlipidemia in her mother; Prostate cancer in her maternal grandfather. There is no history of Lung disease.  ROS:   Please see the history of present illness.    Very depressed about the restrictions on physical activity.  Very concerned about the possibility of having a stroke with no procedures required to evaluate and treat possible  severe aortic stenosis.  She is ready for further evaluation and to feel better.  All other systems reviewed and are negative.  EKGs/Labs/Other Studies Reviewed:    The following studies were reviewed today:  ECHOCARDIOGRAM 12/2018: IMPRESSIONS    1. Left ventricular ejection fraction, by visual estimation, is 60 to  65%. The left ventricle has normal function. Normal left ventricular size.  There is moderately increased left ventricular hypertrophy.  2. Left ventricular diastolic Doppler parameters are consistent with  pseudonormalization pattern of LV diastolic filling.  3. Global right ventricle has normal systolic function.The right  ventricular size is normal. No increase in right ventricular wall  thickness.  4. Left atrial size was mildly dilated.  5. Right atrial size was normal.  6. Severe mitral annular calcification.  7. The mitral valve is degenerative. Mild mitral valve regurgitation.  Mild mitral stenosis.  8. Mean mitral valve gradient 87mmHg.  9. The tricuspid valve is normal in structure. Tricuspid valve  regurgitation is mild.  10. Aortic valve mean gradient measures 24.5 mmHg.  11. Aortic valve peak gradient measures 41.2 mmHg.  12. The aortic valve is normal in structure. Aortic valve regurgitation  was not visualized by color flow Doppler. Moderate aortic valve stenosis.  13. There is Moderate calcification of the aortic valve.  14. There is Moderate thickening of the  aortic valve.  15. The pulmonic valve was normal in structure. Pulmonic valve  regurgitation is not visualized by color flow Doppler.  16. Moderately elevated pulmonary artery systolic pressure.  17. The inferior vena cava is normal in size with greater than 50%  respiratory variability, suggesting right atrial pressure of 3 mmHg.   EKG:  EKG EKG is not repeated today.  The last EKG performed was from November 2020 when she saw me and it demonstrated sinus rhythm with minimal ST-T abnormality.  Recent Labs: 07/04/2019: ALT 16  Recent Lipid Panel    Component Value Date/Time   CHOL 150 07/04/2019 0749   TRIG 100 07/04/2019 0749   HDL 68 07/04/2019 0749   CHOLHDL 2.2 07/04/2019 0749   CHOLHDL 6.3 12/11/2007 0350   VLDL 32 12/11/2007 0350   LDLCALC 64 07/04/2019 0749    Physical Exam:    VS:  BP (!) 152/76   Pulse (!) 101   Ht 5' 3.5" (1.613 m)   Wt 147 lb (66.7 kg)   SpO2 96%   BMI 25.63 kg/m     Wt Readings from Last 3 Encounters:  07/25/19 147 lb (66.7 kg)  02/14/19 152 lb 12.8 oz (69.3 kg)  01/31/19 155 lb (70.3 kg)     GEN: Good skin color.. No acute distress HEENT: Normal NECK: No JVD. LYMPHATICS: No lymphadenopathy CARDIAC: 3/6 to 4/6 crescendo decrescendo right upper sternal and left mid to lower sternal border crescendo decrescendo aortic stenosis murmur.  RRR without diastolic murmur.  There is no gallop, or edema. VASCULAR:  Normal Pulses. No bruits. RESPIRATORY:  Clear to auscultation without rales, wheezing or rhonchi  ABDOMEN: Soft, non-tender, non-distended, No pulsatile mass, MUSCULOSKELETAL: No deformity  SKIN: Warm and dry NEUROLOGIC:  Alert and oriented x 3 PSYCHIATRIC:  Normal affect   ASSESSMENT:    1. Nonrheumatic aortic valve stenosis   2. Coronary artery disease involving native coronary artery of native heart with angina pectoris ([place])   3. Other hyperlipidemia   4. Essential hypertension   5. Type 2 diabetes mellitus without complication,  without long-term current use of insulin ([place])   6.  SOB (shortness of breath)   7. Educated about COVID-19 virus infection    PLAN:    In order of problems listed above:  1. Aortic stenosis, possibly more severe than recorded by the most recent echo in October.  I am also concerned that there may be progression of coronary disease.  She needs right and left heart cath with coronary angiography to stage her disease.  Currently I am concerned that her limitations are due to a combination of CAD and aortic stenosis.  She has decided that she will not have surgical therapy of any of her cardiac issues.  She is most concerned that she may have a stroke with any procedure done to fix her valve.  We discussed the risk of stroke related to TAVR is being slightly above 1%. 2. Chest tightness with activity, possibly related to progression of coronary disease. 3. Continue Repatha.  Most recent LDL was 64. 4. Blood pressure is higher than I would like.  I recommended diuretics in the past but she refuses to take them.  The diuretic may help improve her dyspnea.  A BMP will be obtained today because of the complaint of shortness of breath.  If elevated I will insist upon a diuretic. 5. She should watch carbohydrates.  Fast hemoglobin A1c is 5.5.  Consider adding an SGLT2 if we determine that dyspnea is related to diastolic heart failure. 6. NP and consideration of diuretic therapy, perhaps spironolactone if elevated. 7. She has received the COVID-19 vaccine.  She is socially distancing.  The patient was counseled to undergo left heart catheterization, coronary angiography, and possible percutaneous coronary intervention with stent implantation. The procedural risks and benefits were discussed in detail. The risks discussed included death, stroke, myocardial infarction, life-threatening bleeding, limb ischemia, kidney injury, allergy, and possible emergency cardiac surgery. The risk of these significant  complications were estimated to occur less than 1% of the time. After discussion, the patient has agreed to proceed.  Prolonged visit with multiple questions, counseling concerning decision making relative to cath and potential valve therapy, and concern  Medication Adjustments/Labs and Tests Ordered: Current medicines are reviewed at length with the patient today.  Concerns regarding medicines are outlined above.  Orders Placed This Encounter  Procedures  . Basic metabolic panel  . CBC  . Pro b natriuretic peptide   No orders of the defined types were placed in this encounter.   Patient Instructions  Medication Instructions:  Your physician recommends that you continue on your current medications as directed. Please refer to the Current Medication list given to you today.  *If you need a refill on your cardiac medications before your next appointment, please call your pharmacy*   Lab Work: BMET, CBC today  If you have labs (blood work) drawn today and your tests are completely normal, you will receive your results only by: Marland Kitchen MyChart Message (if you have MyChart) OR . A paper copy in the mail If you have any lab test that is abnormal or we need to change your treatment, we will call you to review the results.   Testing/Procedures: Your physician has requested that you have a cardiac catheterization. Cardiac catheterization is used to diagnose and/or treat various heart conditions. Doctors may recommend this procedure for a number of different reasons. The most common reason is to evaluate chest pain. Chest pain can be a symptom of coronary artery disease (CAD), and cardiac catheterization can show whether plaque is narrowing or blocking your heart's arteries. This  procedure is also used to evaluate the valves, as well as measure the blood flow and oxygen levels in different parts of your heart. For further information please visit [email or web address]. Please follow instruction sheet,  as given.    Follow-Up: At [facility], you and your health needs are our priority.  As part of our continuing mission to provide you with exceptional heart care, we have created designated Provider Care Teams.  These Care Teams include your primary Cardiologist (physician) and Advanced Practice Providers (APPs -  Physician Assistants and Nurse Practitioners) who all work together to provide you with the care you need, when you need it.  We recommend signing up for the patient portal called "MyChart".  Sign up information is provided on this After Visit Summary.  MyChart is used to connect with patients for Virtual Visits (Telemedicine).  Patients are able to view lab/test results, encounter notes, upcoming appointments, etc.  Non-urgent messages can be sent to your provider as well.   To learn more about what you can do with MyChart, go to [email or web address].    Your next appointment:   2-3 week(s) after cath  The format for your next appointment:   In Person  Provider:   You may see Sinclair Grooms, MD or one of the following Advanced Practice Providers on your designated Care Team:    Truitt Merle, NP  Cecilie Kicks, NP  Kathyrn Drown, NP    Other Instructions     [facility] OFFICE [place], [place] [address] Dept: [place]: [phone]  TEILA MOCZYGEMBA  07/25/2019  You are scheduled for a Cardiac Catheterization on Monday, May 10 with Dr. Daneen Schick.  1. Please arrive at the [facility] (Main Entrance A) at [facility]: [address] [place], [place] [address] at 7:00 AM (This time is two hours before your procedure to ensure your preparation). Free valet parking service is available.   Special note: Every effort is made to have your procedure done on time. Please understand that emergencies sometimes delay scheduled procedures.  2. Diet:  Do not eat solid foods after midnight.  The patient may have clear liquids until 5am upon the day of the procedure.  3. Labs: You will have labs drawn today.  4. Medication instructions in preparation for your procedure:   Contrast Allergy: No   Do not take Diabetes Med Glucophage (Metformin) on the day of the procedure and HOLD 48 HOURS AFTER THE PROCEDURE.  On the morning of your procedure, take your Aspirin and any morning medicines NOT listed above.  You may use sips of water.  5. Plan for one night stay--bring personal belongings. 6. Bring a current list of your medications and current insurance cards. 7. You MUST have a responsible person to drive you home. 8. Someone MUST be with you the first 24 hours after you arrive home or your discharge will be delayed. 9. Please wear clothes that are easy to get on and off and wear slip-on shoes.  Thank you for allowing [place] to care for you!   --  Invasive Cardiovascular services     Signed, Sinclair Grooms, MD  07/25/2019 9:50 AM    [place]

## 2019-07-24 NOTE — Progress Notes (Signed)
Cardiology Office Note:    Date:  07/25/2019   ID:  Cheryl Hernandez, DOB 1940-04-11, MRN [id number]  PCP:  [name], MD  Cardiologist:  Sinclair Grooms, MD   Referring MD: [name], MD   No chief complaint on file.   History of Present Illness:    [name] is a 79 y.o. female with a hx of  a hx of calcific aortic stenosis, CAD, DES RCA 2 2009, paroxysmal palpitations, hypertension, hyperlipidemia, moderate aortic stenosis, and diabetes mellitus type 2.  [name] complains of significant change in overall exertional tolerance during the past year.  About 12 months ago when the shutdown occurred because of the COVID-19 pandemic, she was complaining of shortness of breath and we have decided to perform coronary angiography with left and right heart cath.  Though the procedure was scheduled, she became concerned about exposure to COVID-19 in the hospital and never followed through.  I have not seen her since that time.  She has seen one of our extenders.  A repeat echocardiogram was performed and as noted below.  There was not much change and aortic valve severity in October.  In speaking with her today, she complains that mild to moderate activity causes both shortness of breath and chest tightness.  She enjoys working in her yard.  She tries to sleep more than 2 for care of her gardens, she can barely get anything done before feeling these symptoms.  She denies orthopnea, PND, dyspnea at rest, and lower extremity swelling.  She has not had syncope.  She has occasional palpitation.  Past Medical History:  Diagnosis Date  . Aortic stenosis    a. Mod AS by echo 08/2014.  . Arthritis   . Asthma   . CAD (coronary artery disease)    a. NSTEMI 2009 s/p DES to RCAx2.  . Carotid artery disease ([place])    a. Mild-moderate - followed by [facility].  . Chronic diarrhea   . Diabetes mellitus ([place])   . Diabetes mellitus without complication ([place])   . Diverticulosis   . GERD  (gastroesophageal reflux disease)   . HTN (hypertension)   . Hyperlipidemia   . Myocardial infarction ([place])   . Obesity   . Paroxysmal ventricular tachycardia ([place])   . Rhinitis   . Rotator cuff tear     Past Surgical History:  Procedure Laterality Date  . ANGIOPLASTY    . CORONARY ANGIOPLASTY WITH STENT PLACEMENT    . TONSILLECTOMY    . TOTAL ABDOMINAL HYSTERECTOMY    . TOTAL HIP ARTHROPLASTY      Current Medications: Current Meds  Medication Sig  . albuterol (VENTOLIN HFA) 108 (90 Base) MCG/ACT inhaler Inhale 2 puffs into the lungs every 6 (six) hours as needed for wheezing or shortness of breath.  . ALPRAZolam (XANAX) 0.5 MG tablet Take 1 tablet by mouth as needed.  Marland Kitchen aspirin EC 81 MG tablet Take 81 mg by mouth daily.   . B Complex-C (B-COMPLEX WITH VITAMIN C) tablet Take 1 tablet by mouth 2 (two) times a week.  . budesonide-formoterol (SYMBICORT) 80-4.5 MCG/ACT inhaler Inhale 2 puffs into the lungs 2 (two) times daily.  Marland Kitchen BYSTOLIC 5 MG tablet TAKE 1 TABLET(5 MG) BY MOUTH DAILY  . calcium carbonate (TUMS - DOSED IN MG ELEMENTAL CALCIUM) 500 MG chewable tablet Chew 1 tablet by mouth 3 (three) times daily as needed. For heartburn.  . cholecalciferol (VITAMIN D) 1000 UNITS tablet Take 2,000 Units by  mouth daily.   . Coenzyme Q10 (CO Q10) 100 MG TABS Take 1 tablet by mouth daily.    . Doxylamine Succinate, Sleep, (UNISOM) 25 MG tablet Take 12.5 mg by mouth at bedtime as needed. For sleep.  . Evolocumab (REPATHA SURECLICK) [id number] MG/ML SOAJ Inject 1 pen into the skin every 14 (fourteen) days.  . fexofenadine (ALLEGRA) 180 MG tablet Take 180 mg by mouth daily.    . Glucose Blood (FREESTYLE LITE TEST VI) Once daily  . metFORMIN (GLUCOPHAGE-XR) 500 MG 24 hr tablet Take 500 mg by mouth daily.  . Multiple Vitamins-Minerals (MULTIVITAMIN WITH MINERALS) tablet Take 1 tablet by mouth daily.    . nitroGLYCERIN (NITROSTAT) 0.4 MG SL tablet Place 1 tablet (0.4 mg total) under the tongue every 5  (five) minutes as needed for chest pain. Please make yearly appt for December.  Marland Kitchen Respiratory Therapy Supplies (FLUTTER) DEVI Use flutter valve 1 times daily as needed     Allergies:   Cefuroxime axetil, Cephalexin, Codeine, Doxycycline, and Erythromycin base   Social History   Socioeconomic History  . Marital status: Widowed    Spouse name: Not on file  . Number of children: Not on file  . Years of education: Not on file  . Highest education level: Not on file  Occupational History  . Occupation: retired Therapist, sports  Tobacco Use  . Smoking status: Former Smoker    Packs/day: 1.00    Years: 30.00    Pack years: 30.00    Types: Cigarettes    Quit date: 03/31/1998    Years since quitting: 21.3  . Smokeless tobacco: Never Used  . Tobacco comment: Quit intermittently since her 27s.  Substance and Sexual Activity  . Alcohol use: Yes    Alcohol/week: 0.0 standard drinks    Comment: Rare.  . Drug use: No  . Sexual activity: Not on file  Other Topics Concern  . Not on file  Social History Narrative   Originally from [place]. She has traveled to [place], [place], [place], & [place]. Previously worked as a Marine scientist. No known TB exposure. No pets currently. No bird, hot tube, or asbestos exposure. Enjoys playing bridge.    Social Determinants of Health   Financial Resource Strain:   . Difficulty of Paying Living Expenses:   Food Insecurity:   . Worried About Charity fundraiser in the Last Year:   . Arboriculturist in the Last Year:   Transportation Needs:   . Film/video editor (Medical):   Marland Kitchen Lack of Transportation (Non-Medical):   Physical Activity:   . Days of Exercise per Week:   . Minutes of Exercise per Session:   Stress:   . Feeling of Stress :   Social Connections:   . Frequency of Communication with Friends and Family:   . Frequency of Social Gatherings with Friends and Family:   . Attends Religious Services:   . Active Member of Clubs or Organizations:   . Attends Archivist  Meetings:   Marland Kitchen Marital Status:      Family History: The patient's family history includes Allergies in her mother; Heart attack in her father; Hyperlipidemia in her mother; Prostate cancer in her maternal grandfather. There is no history of Lung disease.  ROS:   Please see the history of present illness.    Very depressed about the restrictions on physical activity.  Very concerned about the possibility of having a stroke with no procedures required to evaluate and treat possible  severe aortic stenosis.  She is ready for further evaluation and to feel better.  All other systems reviewed and are negative.  EKGs/Labs/Other Studies Reviewed:    The following studies were reviewed today:  ECHOCARDIOGRAM 12/2018: IMPRESSIONS    1. Left ventricular ejection fraction, by visual estimation, is 60 to  65%. The left ventricle has normal function. Normal left ventricular size.  There is moderately increased left ventricular hypertrophy.  2. Left ventricular diastolic Doppler parameters are consistent with  pseudonormalization pattern of LV diastolic filling.  3. Global right ventricle has normal systolic function.The right  ventricular size is normal. No increase in right ventricular wall  thickness.  4. Left atrial size was mildly dilated.  5. Right atrial size was normal.  6. Severe mitral annular calcification.  7. The mitral valve is degenerative. Mild mitral valve regurgitation.  Mild mitral stenosis.  8. Mean mitral valve gradient 42mmHg.  9. The tricuspid valve is normal in structure. Tricuspid valve  regurgitation is mild.  10. Aortic valve mean gradient measures 24.5 mmHg.  11. Aortic valve peak gradient measures 41.2 mmHg.  12. The aortic valve is normal in structure. Aortic valve regurgitation  was not visualized by color flow Doppler. Moderate aortic valve stenosis.  13. There is Moderate calcification of the aortic valve.  14. There is Moderate thickening of the  aortic valve.  15. The pulmonic valve was normal in structure. Pulmonic valve  regurgitation is not visualized by color flow Doppler.  16. Moderately elevated pulmonary artery systolic pressure.  17. The inferior vena cava is normal in size with greater than 50%  respiratory variability, suggesting right atrial pressure of 3 mmHg.   EKG:  EKG EKG is not repeated today.  The last EKG performed was from November 2020 when she saw me and it demonstrated sinus rhythm with minimal ST-T abnormality.  Recent Labs: 07/04/2019: ALT 16  Recent Lipid Panel    Component Value Date/Time   CHOL 150 07/04/2019 0749   TRIG 100 07/04/2019 0749   HDL 68 07/04/2019 0749   CHOLHDL 2.2 07/04/2019 0749   CHOLHDL 6.3 12/11/2007 0350   VLDL 32 12/11/2007 0350   LDLCALC 64 07/04/2019 0749    Physical Exam:    VS:  BP (!) 152/76   Pulse (!) 101   Ht 5' 3.5" (1.613 m)   Wt 147 lb (66.7 kg)   SpO2 96%   BMI 25.63 kg/m     Wt Readings from Last 3 Encounters:  07/25/19 147 lb (66.7 kg)  02/14/19 152 lb 12.8 oz (69.3 kg)  01/31/19 155 lb (70.3 kg)     GEN: Good skin color.. No acute distress HEENT: Normal NECK: No JVD. LYMPHATICS: No lymphadenopathy CARDIAC: 3/6 to 4/6 crescendo decrescendo right upper sternal and left mid to lower sternal border crescendo decrescendo aortic stenosis murmur.  RRR without diastolic murmur.  There is no gallop, or edema. VASCULAR:  Normal Pulses. No bruits. RESPIRATORY:  Clear to auscultation without rales, wheezing or rhonchi  ABDOMEN: Soft, non-tender, non-distended, No pulsatile mass, MUSCULOSKELETAL: No deformity  SKIN: Warm and dry NEUROLOGIC:  Alert and oriented x 3 PSYCHIATRIC:  Normal affect   ASSESSMENT:    1. Nonrheumatic aortic valve stenosis   2. Coronary artery disease involving native coronary artery of native heart with angina pectoris ([place])   3. Other hyperlipidemia   4. Essential hypertension   5. Type 2 diabetes mellitus without complication,  without long-term current use of insulin ([place])   6.  SOB (shortness of breath)   7. Educated about COVID-19 virus infection    PLAN:    In order of problems listed above:  1. Aortic stenosis, possibly more severe than recorded by the most recent echo in October.  I am also concerned that there may be progression of coronary disease.  She needs right and left heart cath with coronary angiography to stage her disease.  Currently I am concerned that her limitations are due to a combination of CAD and aortic stenosis.  She has decided that she will not have surgical therapy of any of her cardiac issues.  She is most concerned that she may have a stroke with any procedure done to fix her valve.  We discussed the risk of stroke related to TAVR is being slightly above 1%. 2. Chest tightness with activity, possibly related to progression of coronary disease. 3. Continue Repatha.  Most recent LDL was 64. 4. Blood pressure is higher than I would like.  I recommended diuretics in the past but she refuses to take them.  The diuretic may help improve her dyspnea.  A BMP will be obtained today because of the complaint of shortness of breath.  If elevated I will insist upon a diuretic. 5. She should watch carbohydrates.  Fast hemoglobin A1c is 5.5.  Consider adding an SGLT2 if we determine that dyspnea is related to diastolic heart failure. 6. NP and consideration of diuretic therapy, perhaps spironolactone if elevated. 7. She has received the COVID-19 vaccine.  She is socially distancing.  The patient was counseled to undergo left heart catheterization, coronary angiography, and possible percutaneous coronary intervention with stent implantation. The procedural risks and benefits were discussed in detail. The risks discussed included death, stroke, myocardial infarction, life-threatening bleeding, limb ischemia, kidney injury, allergy, and possible emergency cardiac surgery. The risk of these significant  complications were estimated to occur less than 1% of the time. After discussion, the patient has agreed to proceed.  Prolonged visit with multiple questions, counseling concerning decision making relative to cath and potential valve therapy, and concern  Medication Adjustments/Labs and Tests Ordered: Current medicines are reviewed at length with the patient today.  Concerns regarding medicines are outlined above.  Orders Placed This Encounter  Procedures  . Basic metabolic panel  . CBC  . Pro b natriuretic peptide   No orders of the defined types were placed in this encounter.   Patient Instructions  Medication Instructions:  Your physician recommends that you continue on your current medications as directed. Please refer to the Current Medication list given to you today.  *If you need a refill on your cardiac medications before your next appointment, please call your pharmacy*   Lab Work: BMET, CBC today  If you have labs (blood work) drawn today and your tests are completely normal, you will receive your results only by: Marland Kitchen MyChart Message (if you have MyChart) OR . A paper copy in the mail If you have any lab test that is abnormal or we need to change your treatment, we will call you to review the results.   Testing/Procedures: Your physician has requested that you have a cardiac catheterization. Cardiac catheterization is used to diagnose and/or treat various heart conditions. Doctors may recommend this procedure for a number of different reasons. The most common reason is to evaluate chest pain. Chest pain can be a symptom of coronary artery disease (CAD), and cardiac catheterization can show whether plaque is narrowing or blocking your heart's arteries. This  procedure is also used to evaluate the valves, as well as measure the blood flow and oxygen levels in different parts of your heart. For further information please visit HugeFiesta.[email or web address]. Please follow instruction sheet,  as given.    Follow-Up: At [facility], you and your health needs are our priority.  As part of our continuing mission to provide you with exceptional heart care, we have created designated Provider Care Teams.  These Care Teams include your primary Cardiologist (physician) and Advanced Practice Providers (APPs -  Physician Assistants and Nurse Practitioners) who all work together to provide you with the care you need, when you need it.  We recommend signing up for the patient portal called "MyChart".  Sign up information is provided on this After Visit Summary.  MyChart is used to connect with patients for Virtual Visits (Telemedicine).  Patients are able to view lab/test results, encounter notes, upcoming appointments, etc.  Non-urgent messages can be sent to your provider as well.   To learn more about what you can do with MyChart, go to [email or web address].    Your next appointment:   2-3 week(s) after cath  The format for your next appointment:   In Person  Provider:   You may see Sinclair Grooms, MD or one of the following Advanced Practice Providers on your designated Care Team:    Truitt Merle, NP  Cecilie Kicks, NP  Kathyrn Drown, NP    Other Instructions     [place] OFFICE [place], [place] [address] Dept: [place]: [phone]  AUTREY MUSHTAQ  07/25/2019  You are scheduled for a Cardiac Catheterization on Monday, May 10 with Dr. Daneen Schick.  1. Please arrive at the [facility] (Main Entrance A) at [facility]: [address] [place], [place] [address] at 7:00 AM (This time is two hours before your procedure to ensure your preparation). Free valet parking service is available.   Special note: Every effort is made to have your procedure done on time. Please understand that emergencies sometimes delay scheduled procedures.  2. Diet:  Do not eat solid foods after midnight.  The patient may have clear liquids until 5am upon the day of the procedure.  3. Labs: You will have labs drawn today.  4. Medication instructions in preparation for your procedure:   Contrast Allergy: No   Do not take Diabetes Med Glucophage (Metformin) on the day of the procedure and HOLD 48 HOURS AFTER THE PROCEDURE.  On the morning of your procedure, take your Aspirin and any morning medicines NOT listed above.  You may use sips of water.  5. Plan for one night stay--bring personal belongings. 6. Bring a current list of your medications and current insurance cards. 7. You MUST have a responsible person to drive you home. 8. Someone MUST be with you the first 24 hours after you arrive home or your discharge will be delayed. 9. Please wear clothes that are easy to get on and off and wear slip-on shoes.  Thank you for allowing [place] to care for you!   --  Invasive Cardiovascular services     Signed, Sinclair Grooms, MD  07/25/2019 9:50 AM    [place]

## 2019-07-25 ENCOUNTER — Other Ambulatory Visit: Payer: Self-pay

## 2019-07-25 ENCOUNTER — Encounter: Payer: Self-pay | Admitting: Interventional Cardiology

## 2019-07-25 ENCOUNTER — Ambulatory Visit: Payer: Medicare Other | Admitting: Interventional Cardiology

## 2019-07-25 VITALS — BP 152/76 | HR 101 | Ht 63.5 in | Wt 147.0 lb

## 2019-07-25 DIAGNOSIS — I350 Nonrheumatic aortic (valve) stenosis: Secondary | ICD-10-CM

## 2019-07-25 DIAGNOSIS — E119 Type 2 diabetes mellitus without complications: Secondary | ICD-10-CM

## 2019-07-25 DIAGNOSIS — E7849 Other hyperlipidemia: Secondary | ICD-10-CM

## 2019-07-25 DIAGNOSIS — I25119 Atherosclerotic heart disease of native coronary artery with unspecified angina pectoris: Secondary | ICD-10-CM | POA: Diagnosis not present

## 2019-07-25 DIAGNOSIS — I10 Essential (primary) hypertension: Secondary | ICD-10-CM | POA: Diagnosis not present

## 2019-07-25 DIAGNOSIS — R0602 Shortness of breath: Secondary | ICD-10-CM

## 2019-07-25 DIAGNOSIS — Z7189 Other specified counseling: Secondary | ICD-10-CM

## 2019-07-25 NOTE — Patient Instructions (Signed)
Medication Instructions:  Your physician recommends that you continue on your current medications as directed. Please refer to the Current Medication list given to you today.  *If you need a refill on your cardiac medications before your next appointment, please call your pharmacy*   Lab Work: BMET, CBC today  If you have labs (blood work) drawn today and your tests are completely normal, you will receive your results only by: Marland Kitchen MyChart Message (if you have MyChart) OR . A paper copy in the mail If you have any lab test that is abnormal or we need to change your treatment, we will call you to review the results.   Testing/Procedures: Your physician has requested that you have a cardiac catheterization. Cardiac catheterization is used to diagnose and/or treat various heart conditions. Doctors may recommend this procedure for a number of different reasons. The most common reason is to evaluate chest pain. Chest pain can be a symptom of coronary artery disease (CAD), and cardiac catheterization can show whether plaque is narrowing or blocking your heart's arteries. This procedure is also used to evaluate the valves, as well as measure the blood flow and oxygen levels in different parts of your heart. For further information please visit HugeFiesta.[email or web address]. Please follow instruction sheet, as given.    Follow-Up: At [facility], you and your health needs are our priority.  As part of our continuing mission to provide you with exceptional heart care, we have created designated Provider Care Teams.  These Care Teams include your primary Cardiologist (physician) and Advanced Practice Providers (APPs -  Physician Assistants and Nurse Practitioners) who all work together to provide you with the care you need, when you need it.  We recommend signing up for the patient portal called "MyChart".  Sign up information is provided on this After Visit Summary.  MyChart is used to connect with patients for  Virtual Visits (Telemedicine).  Patients are able to view lab/test results, encounter notes, upcoming appointments, etc.  Non-urgent messages can be sent to your provider as well.   To learn more about what you can do with MyChart, go to [email or web address].    Your next appointment:   2-3 week(s) after cath  The format for your next appointment:   In Person  Provider:   You may see Sinclair Grooms, MD or one of the following Advanced Practice Providers on your designated Care Team:    Truitt Merle, NP  Cecilie Kicks, NP  Kathyrn Drown, NP    Other Instructions     [place] OFFICE [place], [place] [address] Dept: [place]: [phone]  Cheryl Hernandez  07/25/2019  You are scheduled for a Cardiac Catheterization on Monday, May 10 with Dr. Daneen Schick.  1. Please arrive at the [facility] (Main Entrance A) at [facility]: [address] [place], [place] [address] at 7:00 AM (This time is two hours before your procedure to ensure your preparation). Free valet parking service is available.   Special note: Every effort is made to have your procedure done on time. Please understand that emergencies sometimes delay scheduled procedures.  2. Diet: Do not eat solid foods after midnight.  The patient may have clear liquids until 5am upon the day of the procedure.  3. Labs: You will have labs drawn today.  4. Medication instructions in preparation for your procedure:   Contrast Allergy: No   Do  not take Diabetes Med Glucophage (Metformin) on the day of the procedure and HOLD 48 HOURS AFTER THE PROCEDURE.  On the morning of your procedure, take your Aspirin and any morning medicines NOT listed above.  You may use sips of water.  5. Plan for one night stay--bring personal belongings. 6. Bring a current list of your medications and current insurance  cards. 7. You MUST have a responsible person to drive you home. 8. Someone MUST be with you the first 24 hours after you arrive home or your discharge will be delayed. 9. Please wear clothes that are easy to get on and off and wear slip-on shoes.  Thank you for allowing [place] to care for you!   -- [place] Invasive Cardiovascular services

## 2019-07-26 ENCOUNTER — Telehealth: Payer: Self-pay | Admitting: Interventional Cardiology

## 2019-07-26 LAB — CBC
Hematocrit: 38.2 % (ref 34.0–46.6)
Hemoglobin: 13 g/dL (ref 11.1–15.9)
MCH: 31.3 pg (ref 26.6–33.0)
MCHC: 34 g/dL (ref 31.5–35.7)
MCV: 92 fL (ref 79–97)
Platelets: 263 10*3/uL (ref 150–450)
RBC: 4.16 x10E6/uL (ref 3.77–5.28)
RDW: 13.1 % (ref 11.7–15.4)
WBC: 9.4 10*3/uL (ref 3.4–10.8)

## 2019-07-26 LAB — BASIC METABOLIC PANEL
BUN/Creatinine Ratio: 23 (ref 12–28)
BUN: 16 mg/dL (ref 8–27)
CO2: 23 mmol/L (ref 20–29)
Calcium: 10.3 mg/dL (ref 8.7–10.3)
Chloride: 102 mmol/L (ref 96–106)
Creatinine, Ser: 0.71 mg/dL (ref 0.57–1.00)
GFR calc Af Amer: 93 mL/min/{1.73_m2} (ref >59)
GFR calc non Af Amer: 81 mL/min/{1.73_m2} (ref >59)
Glucose: 90 mg/dL (ref 65–99)
Potassium: 4.5 mmol/L (ref 3.5–5.2)
Sodium: 138 mmol/L (ref 134–144)

## 2019-07-26 LAB — PRO B NATRIURETIC PEPTIDE: NT-Pro BNP: 183 pg/mL (ref 0–738)

## 2019-07-26 NOTE — Telephone Encounter (Signed)
Spoke with pt and advised her that I did not have any openings with Dr. [name] in the timeframe that she needed to return to the office.  She asked that I monitor for cancellations. Advised I would and would call if anything became available. Pt appreciative for call.

## 2019-07-26 NOTE — Telephone Encounter (Signed)
Error:       Did not need this encounter

## 2019-07-26 NOTE — Telephone Encounter (Signed)
New Message:    Pt would like to speak to [facility] about her follow up appt after her Cath. Pt does not want to see an APP, she wants to see Dr [name]. She wants to see if Anderson Malta can work her in  after her Cath on 08-08-19.

## 2019-08-02 ENCOUNTER — Ambulatory Visit: Payer: Medicare Other | Admitting: Orthotics

## 2019-08-02 ENCOUNTER — Other Ambulatory Visit: Payer: Self-pay

## 2019-08-03 ENCOUNTER — Telehealth: Payer: Self-pay | Admitting: Pulmonary Disease

## 2019-08-03 ENCOUNTER — Telehealth: Payer: Self-pay | Admitting: Interventional Cardiology

## 2019-08-03 ENCOUNTER — Ambulatory Visit (INDEPENDENT_AMBULATORY_CARE_PROVIDER_SITE_OTHER): Payer: Medicare Other | Admitting: Primary Care

## 2019-08-03 DIAGNOSIS — J4541 Moderate persistent asthma with (acute) exacerbation: Secondary | ICD-10-CM | POA: Diagnosis not present

## 2019-08-03 MED ORDER — AZITHROMYCIN 250 MG PO TABS: ORAL_TABLET | ORAL | 0 refills | Status: DC

## 2019-08-03 MED ORDER — PREDNISONE 10 MG PO TABS: ORAL_TABLET | ORAL | 0 refills | Status: DC

## 2019-08-03 NOTE — Telephone Encounter (Signed)
Spoke with pt and she states that since returning from her trip, with the pollen and rain she has been a little wheezy. She wasn't sure if this would cause trouble for her heart cath. Advised pt to reach out to PCP to see if he can see her and see if she has anything like a URI going on.  Advised to call back to reschedule cath if worsening of symptoms and PCP felt this should be delayed. Pt verbalized understanding.

## 2019-08-03 NOTE — Progress Notes (Signed)
Virtual Visit via Telephone Note  I connected with Cheryl Hernandez on 08/03/19 at  3:30 PM EDT by telephone and verified that I am speaking with the correct person using two identifiers.  Location: Patient: Home Provider: Home   I discussed the limitations, risks, security and privacy concerns of performing an evaluation and management service by telephone and the availability of in person appointments. I also discussed with the patient that there may be a patient responsible charge related to this service. The patient expressed understanding and agreed to proceed.   History of Present Illness: 79 year old female, former smoker quit 2000 (30 pack year hx). PMH significant for moderate persistent asthma, allergic rhinitis, hypertension, CAD, severe aortic stenosis, paroxysmal tachycardia, GERD, type 2 diabetes, hyperlipidemia. Patient of Dr. Elsworth Soho, last seen on 01/31/19. Maintained on prn Symbicort.   08/03/2019 Patient contacted today for acute televisit for cough. Reports congested cough with associated wheezing at night and chest tightness x 1-2 days. No improvement from Symbicort or albuterol inhaler. She has been taking mucus relief. She has a flutter valve but not currently using. She is scheduled for right/left heart catheterization on Sunday 5/9. She has been in contract with nurse from cardiologist office to notify them of bronchitis symptoms.    Observations/Objective:  - Able to speak in full sentences; no overt wheezing or shortness of breath  PFT 08/2015: FVC 2.24 L (85%) FEV1 1.82 L (93%) ratio 81 no bronchodilator response TLC 4.49 L (91%) RV 90% ERV 27% DLCO uncorrected 81% (hemoglobin 13.6)  11/2014:FVC 3.52 L (130%) FEV1 2.81 L (139%) FEV1/FVC 0.80   09/13/10:FVC 2.49 L (87%) FEV1 1.92 L (89%) FEV1/FVC 0.77  Assessment and Plan:  Asthmatic bronchitis: - Congested cough x 1-2 days with associated wheezing/chest tightness - Resume Symbicort 80 two puffs twice  daily; use albuterol 2 puffs every 4-6 hours for sob/wheezing   - Rx Prednisone taper; given zpack to take if symptoms do not improve  - Recommend patient use flutter valve 5-10 breaths/ hour while awake  - Continue mucus relief over the counter   Follow Up Instructions:  - Advised patient notify cardiologist if she continues to have symptoms prior to scheduled procedure, may need to be rescheduled    I discussed the assessment and treatment plan with the patient. The patient was provided an opportunity to ask questions and all were answered. The patient agreed with the plan and demonstrated an understanding of the instructions.   The patient was advised to call back or seek an in-person evaluation if the symptoms worsen or if the condition fails to improve as anticipated.  I provided 18 minutes of non-face-to-face time during this encounter.   Martyn Ehrich, NP

## 2019-08-03 NOTE — Telephone Encounter (Signed)
Spoke with pt, c/o chest tightness, sob, nonprod cough X1 day.  Denies fever, mucus production, HA, sinus congestion, loss of taste/smell.     Has been taking Albuterol HFA prn and maintenance Symbicort, using flutter valve.    Pt is requesting either a pred taper or a Depo injection.  Pt was offered a televisit/virtual visit and declined.  [facility]: [facility] [address].    Sending to APP since RA is not in clinic.  Sarah please advise on recs.  Thanks!

## 2019-08-03 NOTE — Telephone Encounter (Signed)
New message:    Patient calling stating that she need to cancel her up coming apt for a CATH. Please call patient.

## 2019-08-03 NOTE — Telephone Encounter (Signed)
lmtcb for pt. We need to schedule her for a televisit today with Beth when she returns call.

## 2019-08-03 NOTE — Telephone Encounter (Signed)
She needs an acute  tele visit of OV. Cheryl Hernandez has tele visits available. Thanks

## 2019-08-04 ENCOUNTER — Telehealth: Payer: Self-pay | Admitting: *Deleted

## 2019-08-04 NOTE — Telephone Encounter (Signed)
Pt contacted pre-catheterization scheduled at [facility] for: Monday Aug 08, 2019 9 AM Verified arrival time and place: [facility]) at: 7 AM   No solid food after midnight prior to cath, clear liquids until 5 AM day of procedure.  Hold: Metformin-day of procedure and 48 hours post procedure  Except hold medications AM meds can be  taken pre-cath with sip of water including: ASA 81 mg   Confirmed patient has responsible adult to drive home post procedure and observe 24 hours after arriving home: yes  You are allowed ONE visitor in the waiting room during your procedures. Both you and your visitor must wear masks.      COVID-19 Pre-Screening Questions:  . In the past 7 to 10 days have you had a cough,  shortness of breath, headache, congestion, fever (100 or greater) body aches, chills, sore throat, or sudden loss of taste or sense of smell? Recent cough and wheezing-from pollen?, no fever . Have you been around anyone with known Covid 19 in the past 7 to 10 days?no . Have you been around anyone who is awaiting Covid 19 test results in the past 7 to 10 days? no . Have you been around anyone who has mentioned symptoms of Covid 19 within the past 7 to 10 days? no   Reviewed procedure/mask/visitor instructions, COVID-19 screening questions with patient.  See office visit 08/03/19-pt states main symptoms cough and wheezing, she is feeling some better today, is going to monitor symptoms and will call office Friday to reschedule if she does not feel ready to have [facility] 08/08/19. Pt knows pre procedure COVID 08/06/19 may need to be rescheduled if she does have procedure 08/08/19. Pt denies fever.

## 2019-08-05 ENCOUNTER — Telehealth: Payer: Self-pay | Admitting: Interventional Cardiology

## 2019-08-05 NOTE — Telephone Encounter (Signed)
New message   Patient states that she does want to have the cath done on Monday. Please call patient with any questions.

## 2019-08-05 NOTE — Telephone Encounter (Signed)
Spoke with pt and she states that she spoke with Webb Silversmith, RN yesterday and was thinking it may be too soon to do cath.  She thought about it last night and feels better today, so she does want to proceed with plan for heart cath on Monday.  Reminded pt to get covid screen tomorrow.  Pt appreciative for call.

## 2019-08-06 ENCOUNTER — Other Ambulatory Visit (HOSPITAL_COMMUNITY)
Admission: RE | Admit: 2019-08-06 | Discharge: 2019-08-06 | Disposition: A | Discharge status: Home / Self Care | Payer: Medicare Other | Source: Ambulatory Visit | Attending: Interventional Cardiology | Admitting: Interventional Cardiology

## 2019-08-06 DIAGNOSIS — Z01812 Encounter for preprocedural laboratory examination: Secondary | ICD-10-CM | POA: Diagnosis present

## 2019-08-06 DIAGNOSIS — Z20822 Contact with and (suspected) exposure to covid-19: Secondary | ICD-10-CM | POA: Diagnosis not present

## 2019-08-06 LAB — SARS CORONAVIRUS 2 (TAT 6-24 HRS): SARS Coronavirus 2: NEGATIVE

## 2019-08-08 ENCOUNTER — Other Ambulatory Visit: Payer: Self-pay

## 2019-08-08 ENCOUNTER — Ambulatory Visit (HOSPITAL_COMMUNITY)
Admission: RE | Admit: 2019-08-08 | Discharge: 2019-08-08 | Disposition: A | Discharge status: Home / Self Care | Payer: Medicare Other | Attending: Interventional Cardiology | Admitting: Interventional Cardiology

## 2019-08-08 ENCOUNTER — Encounter (HOSPITAL_COMMUNITY)
Admission: RE | Disposition: A | Discharge status: Home / Self Care | Payer: Self-pay | Source: Home / Self Care | Attending: Interventional Cardiology

## 2019-08-08 DIAGNOSIS — E785 Hyperlipidemia, unspecified: Secondary | ICD-10-CM | POA: Diagnosis present

## 2019-08-08 DIAGNOSIS — Z7982 Long term (current) use of aspirin: Secondary | ICD-10-CM | POA: Insufficient documentation

## 2019-08-08 DIAGNOSIS — E7849 Other hyperlipidemia: Secondary | ICD-10-CM | POA: Diagnosis not present

## 2019-08-08 DIAGNOSIS — I252 Old myocardial infarction: Secondary | ICD-10-CM | POA: Insufficient documentation

## 2019-08-08 DIAGNOSIS — E669 Obesity, unspecified: Secondary | ICD-10-CM | POA: Diagnosis not present

## 2019-08-08 DIAGNOSIS — Z7984 Long term (current) use of oral hypoglycemic drugs: Secondary | ICD-10-CM | POA: Diagnosis not present

## 2019-08-08 DIAGNOSIS — Z79899 Other long term (current) drug therapy: Secondary | ICD-10-CM | POA: Insufficient documentation

## 2019-08-08 DIAGNOSIS — M1990 Unspecified osteoarthritis, unspecified site: Secondary | ICD-10-CM | POA: Insufficient documentation

## 2019-08-08 DIAGNOSIS — I2584 Coronary atherosclerosis due to calcified coronary lesion: Secondary | ICD-10-CM | POA: Insufficient documentation

## 2019-08-08 DIAGNOSIS — K219 Gastro-esophageal reflux disease without esophagitis: Secondary | ICD-10-CM | POA: Insufficient documentation

## 2019-08-08 DIAGNOSIS — J45909 Unspecified asthma, uncomplicated: Secondary | ICD-10-CM | POA: Insufficient documentation

## 2019-08-08 DIAGNOSIS — Z7951 Long term (current) use of inhaled steroids: Secondary | ICD-10-CM | POA: Diagnosis not present

## 2019-08-08 DIAGNOSIS — Z8249 Family history of ischemic heart disease and other diseases of the circulatory system: Secondary | ICD-10-CM | POA: Diagnosis not present

## 2019-08-08 DIAGNOSIS — Z881 Allergy status to other antibiotic agents status: Secondary | ICD-10-CM | POA: Insufficient documentation

## 2019-08-08 DIAGNOSIS — I479 Paroxysmal tachycardia, unspecified: Secondary | ICD-10-CM | POA: Diagnosis present

## 2019-08-08 DIAGNOSIS — E119 Type 2 diabetes mellitus without complications: Secondary | ICD-10-CM

## 2019-08-08 DIAGNOSIS — Z955 Presence of coronary angioplasty implant and graft: Secondary | ICD-10-CM | POA: Diagnosis not present

## 2019-08-08 DIAGNOSIS — Z87891 Personal history of nicotine dependence: Secondary | ICD-10-CM | POA: Diagnosis not present

## 2019-08-08 DIAGNOSIS — I25119 Atherosclerotic heart disease of native coronary artery with unspecified angina pectoris: Secondary | ICD-10-CM | POA: Diagnosis present

## 2019-08-08 DIAGNOSIS — Z6825 Body mass index (BMI) 25.0-25.9, adult: Secondary | ICD-10-CM | POA: Insufficient documentation

## 2019-08-08 DIAGNOSIS — I253 Aneurysm of heart: Secondary | ICD-10-CM | POA: Diagnosis not present

## 2019-08-08 DIAGNOSIS — Z885 Allergy status to narcotic agent status: Secondary | ICD-10-CM | POA: Diagnosis not present

## 2019-08-08 DIAGNOSIS — I350 Nonrheumatic aortic (valve) stenosis: Secondary | ICD-10-CM

## 2019-08-08 DIAGNOSIS — I119 Hypertensive heart disease without heart failure: Secondary | ICD-10-CM | POA: Insufficient documentation

## 2019-08-08 DIAGNOSIS — Z96649 Presence of unspecified artificial hip joint: Secondary | ICD-10-CM | POA: Diagnosis not present

## 2019-08-08 DIAGNOSIS — I10 Essential (primary) hypertension: Secondary | ICD-10-CM | POA: Diagnosis present

## 2019-08-08 HISTORY — PX: RIGHT/LEFT HEART CATH AND CORONARY ANGIOGRAPHY: CATH118266

## 2019-08-08 LAB — POCT I-STAT 7, (LYTES, BLD GAS, ICA,H+H)
Acid-Base Excess: 2 mmol/L (ref 0.0–2.0)
Bicarbonate: 27.7 mmol/L (ref 20.0–28.0)
Calcium, Ion: 1.3 mmol/L (ref 1.15–1.40)
HCT: 35 % — ABNORMAL LOW (ref 36.0–46.0)
Hemoglobin: 11.9 g/dL — ABNORMAL LOW (ref 12.0–15.0)
O2 Saturation: 99 %
Potassium: 3.7 mmol/L (ref 3.5–5.1)
Sodium: 141 mmol/L (ref 135–145)
TCO2: 29 mmol/L (ref 22–32)
pCO2 arterial: 48.3 mmHg — ABNORMAL HIGH (ref 32.0–48.0)
pH, Arterial: 7.366 (ref 7.350–7.450)
pO2, Arterial: 150 mmHg — ABNORMAL HIGH (ref 83.0–108.0)

## 2019-08-08 LAB — POCT I-STAT EG7
Acid-Base Excess: 2 mmol/L (ref 0.0–2.0)
Bicarbonate: 28.2 mmol/L — ABNORMAL HIGH (ref 20.0–28.0)
Calcium, Ion: 1.33 mmol/L (ref 1.15–1.40)
HCT: 36 % (ref 36.0–46.0)
Hemoglobin: 12.2 g/dL (ref 12.0–15.0)
O2 Saturation: 75 %
Potassium: 3.7 mmol/L (ref 3.5–5.1)
Sodium: 141 mmol/L (ref 135–145)
TCO2: 30 mmol/L (ref 22–32)
pCO2, Ven: 50.4 mmHg (ref 44.0–60.0)
pH, Ven: 7.356 (ref 7.250–7.430)
pO2, Ven: 42 mmHg (ref 32.0–45.0)

## 2019-08-08 LAB — GLUCOSE, CAPILLARY: Glucose-Capillary: 102 mg/dL — ABNORMAL HIGH (ref 70–99)

## 2019-08-08 SURGERY — RIGHT/LEFT HEART CATH AND CORONARY ANGIOGRAPHY
Anesthesia: LOCAL

## 2019-08-08 MED ORDER — HAIR/SKIN/NAILS/BIOTIN PO TABS: ORAL_TABLET | Freq: Every day | ORAL | Status: DC

## 2019-08-08 MED ORDER — IOHEXOL 350 MG/ML SOLN
INTRAVENOUS | Status: DC | PRN
  Administered 2019-08-08: 80 mL

## 2019-08-08 MED ORDER — SODIUM CHLORIDE 0.9 % WEIGHT BASED INFUSION
3.0000 mL/kg/h | INTRAVENOUS | Status: AC
  Administered 2019-08-08: 3 mL/kg/h via INTRAVENOUS

## 2019-08-08 MED ORDER — VERAPAMIL HCL 2.5 MG/ML IV SOLN
INTRAVENOUS | Status: AC
  Filled 2019-08-08: qty 2

## 2019-08-08 MED ORDER — LIDOCAINE HCL (PF) 1 % IJ SOLN
INTRAMUSCULAR | Status: AC
  Filled 2019-08-08: qty 60

## 2019-08-08 MED ORDER — B COMPLEX-C PO TABS: 1.0000 | ORAL_TABLET | ORAL | Status: DC

## 2019-08-08 MED ORDER — HEPARIN SODIUM (PORCINE) 1000 UNIT/ML IJ SOLN
INTRAMUSCULAR | Status: DC | PRN
  Administered 2019-08-08: 3000 [IU] via INTRAVENOUS

## 2019-08-08 MED ORDER — FENTANYL CITRATE (PF) 100 MCG/2ML IJ SOLN
INTRAMUSCULAR | Status: AC
  Filled 2019-08-08: qty 2

## 2019-08-08 MED ORDER — NEBIVOLOL HCL 2.5 MG PO TABS: 2.5000 mg | ORAL_TABLET | Freq: Every day | ORAL | Status: DC

## 2019-08-08 MED ORDER — MIDAZOLAM HCL 2 MG/2ML IJ SOLN
INTRAMUSCULAR | Status: DC | PRN
  Administered 2019-08-08 (×2): 1 mg via INTRAVENOUS

## 2019-08-08 MED ORDER — CALCIUM CARBONATE ANTACID 500 MG PO CHEW: 1.0000 | CHEWABLE_TABLET | Freq: Two times a day (BID) | ORAL | Status: DC

## 2019-08-08 MED ORDER — OMEGA-3 1000 MG PO CAPS: 1000.0000 mg | ORAL_CAPSULE | Freq: Every day | ORAL | Status: DC

## 2019-08-08 MED ORDER — SODIUM CHLORIDE 0.9 % IV SOLN: 250.0000 mL | INTRAVENOUS | Status: DC | PRN

## 2019-08-08 MED ORDER — TURMERIC 400 MG PO CAPS: 400.0000 mg | ORAL_CAPSULE | Freq: Every day | ORAL | Status: DC

## 2019-08-08 MED ORDER — HEPARIN SODIUM (PORCINE) 1000 UNIT/ML IJ SOLN
INTRAMUSCULAR | Status: AC
  Filled 2019-08-08: qty 1

## 2019-08-08 MED ORDER — HEPARIN (PORCINE) IN NACL 1000-0.9 UT/500ML-% IV SOLN
INTRAVENOUS | Status: AC
  Filled 2019-08-08: qty 2000

## 2019-08-08 MED ORDER — SODIUM CHLORIDE 0.9% FLUSH: 3.0000 mL | INTRAVENOUS | Status: DC | PRN

## 2019-08-08 MED ORDER — METFORMIN HCL ER 500 MG PO TB24: 500.0000 mg | ORAL_TABLET | Freq: Every day | ORAL | Status: DC

## 2019-08-08 MED ORDER — ALBUTEROL SULFATE HFA 108 (90 BASE) MCG/ACT IN AERS: 2.0000 | INHALATION_SPRAY | Freq: Four times a day (QID) | RESPIRATORY_TRACT | Status: DC | PRN

## 2019-08-08 MED ORDER — EVOLOCUMAB 140 MG/ML ~~LOC~~ SOAJ: 140.0000 mg | SUBCUTANEOUS | Status: DC

## 2019-08-08 MED ORDER — MOMETASONE FURO-FORMOTEROL FUM 100-5 MCG/ACT IN AERO: 2.0000 | INHALATION_SPRAY | Freq: Two times a day (BID) | RESPIRATORY_TRACT | Status: DC

## 2019-08-08 MED ORDER — SODIUM CHLORIDE 0.9% FLUSH: 3.0000 mL | Freq: Two times a day (BID) | INTRAVENOUS | Status: DC

## 2019-08-08 MED ORDER — VERAPAMIL HCL 2.5 MG/ML IV SOLN
INTRAVENOUS | Status: DC | PRN
  Administered 2019-08-08: 10 mL via INTRA_ARTERIAL

## 2019-08-08 MED ORDER — LORATADINE 10 MG PO TABS: 10.0000 mg | ORAL_TABLET | Freq: Every day | ORAL | Status: DC

## 2019-08-08 MED ORDER — ASPIRIN 81 MG PO CHEW
81.0000 mg | CHEWABLE_TABLET | ORAL | Status: AC
  Administered 2019-08-08: 81 mg via ORAL
  Filled 2019-08-08: qty 1

## 2019-08-08 MED ORDER — NITROGLYCERIN 0.4 MG SL SUBL: 0.4000 mg | SUBLINGUAL_TABLET | SUBLINGUAL | Status: DC | PRN

## 2019-08-08 MED ORDER — LIDOCAINE HCL (PF) 1 % IJ SOLN
INTRAMUSCULAR | Status: DC | PRN
  Administered 2019-08-08 (×2): 2 mL

## 2019-08-08 MED ORDER — HEPARIN (PORCINE) IN NACL 1000-0.9 UT/500ML-% IV SOLN
INTRAVENOUS | Status: DC | PRN
  Administered 2019-08-08 (×2): 500 mL

## 2019-08-08 MED ORDER — MULTI-VITAMIN/MINERALS PO TABS: 1.0000 | ORAL_TABLET | Freq: Every day | ORAL | Status: DC

## 2019-08-08 MED ORDER — SODIUM CHLORIDE 0.9 % WEIGHT BASED INFUSION: 1.0000 mL/kg/h | INTRAVENOUS | Status: DC

## 2019-08-08 MED ORDER — MIDAZOLAM HCL 2 MG/2ML IJ SOLN
INTRAMUSCULAR | Status: AC
  Filled 2019-08-08: qty 2

## 2019-08-08 MED ORDER — FENTANYL CITRATE (PF) 100 MCG/2ML IJ SOLN
INTRAMUSCULAR | Status: DC | PRN
  Administered 2019-08-08 (×2): 25 ug via INTRAVENOUS

## 2019-08-08 MED ORDER — TEMAZEPAM 15 MG PO CAPS: 15.0000 mg | ORAL_CAPSULE | Freq: Every day | ORAL | Status: DC

## 2019-08-08 MED ORDER — ASPIRIN EC 81 MG PO TBEC: 162.0000 mg | DELAYED_RELEASE_TABLET | Freq: Every day | ORAL | Status: DC

## 2019-08-08 SURGICAL SUPPLY — 13 items
CATH 5FR JL3.5 JR4 ANG PIG MP (CATHETERS) ×1 IMPLANT
CATH BALLN WEDGE 5F 110CM (CATHETERS) ×1 IMPLANT
DEVICE RAD COMP TR BAND LRG (VASCULAR PRODUCTS) ×1 IMPLANT
GLIDESHEATH SLEND A-KIT 6F 22G (SHEATH) ×1 IMPLANT
GUIDEWIRE .025 260CM (WIRE) ×1 IMPLANT
GUIDEWIRE INQWIRE 1.5J.035X260 (WIRE) IMPLANT
INQWIRE 1.5J .035X260CM (WIRE) ×4
KIT HEART LEFT (KITS) ×2 IMPLANT
PACK CARDIAC CATHETERIZATION (CUSTOM PROCEDURE TRAY) ×2 IMPLANT
SHEATH GLIDE SLENDER 4/5FR (SHEATH) ×1 IMPLANT
TRANSDUCER W/STOPCOCK (MISCELLANEOUS) ×2 IMPLANT
TUBING CIL FLEX 10 FLL-RA (TUBING) ×2 IMPLANT
WIRE EMERALD ST .035X150CM (WIRE) ×1 IMPLANT

## 2019-08-08 NOTE — Interval H&P Note (Signed)
Cath Lab Visit (complete for each Cath Lab visit)  Clinical Evaluation Leading to the Procedure:   ACS: No.  Non-ACS:    Anginal Classification: CCS Hernandez  Anti-ischemic medical therapy: Minimal Therapy (1 class of medications)  Non-Invasive Test Results: High-risk stress test findings: cardiac mortality >3%/year  Prior CABG: Previous CABG      History and Physical Interval Note:  08/08/2019 9:42 AM  Cheryl Hernandez  has presented today for surgery, with the diagnosis of shortness of breath.  The various methods of treatment have been discussed with the patient and family. After consideration of risks, benefits and other options for treatment, the patient has consented to  Procedure(s): RIGHT/LEFT HEART CATH AND CORONARY ANGIOGRAPHY (N/A) as a surgical intervention.  The patient's history has been reviewed, patient examined, no change in status, stable for surgery.  I have reviewed the patient's chart and labs.  Questions were answered to the patient's satisfaction.     Cheryl Hernandez

## 2019-08-08 NOTE — Discharge Instructions (Signed)
Resume Metformin on 08/10/2019        DRINK PLENTY OF FLUIDS FOR THE NEXT 2-3 DAYS.  KEEP ARM ELEVATED THE REMAINDER OF THE DAY.  HOLD METFORMIN FOR A FULL 48 HOURS AFTER DISCHARGE.  Radial Site Care  This sheet gives you information about how to care for yourself after your procedure. Your health care provider may also give you more specific instructions. If you have problems or questions, contact your health care provider. What can I expect after the procedure? After the procedure, it is common to have:  Bruising and tenderness at the catheter insertion area. Follow these instructions at home: Medicines  Take over-the-counter and prescription medicines only as told by your health care provider. Insertion site care 1. Follow instructions from your health care provider about how to take care of your insertion site. Make sure you: ? Wash your hands with soap and water before you change your bandage (dressing). If soap and water are not available, use hand sanitizer. ? Change your dressing as told by your health care provider. 2. Check your insertion site every day for signs of infection. Check for: ? Redness, swelling, or pain. ? Fluid or blood. ? Pus or a bad smell. ? Warmth. 3. Do not take baths, swim, or use a hot tub for 5 days. 4. You may shower 24-48 hours after the procedure. ? Remove the dressing and gently wash the site with plain soap and water. ? Pat the area dry with a clean towel. ? Do not rub the site. That could cause bleeding. 5. Do not apply powder or lotion to the site. Activity  1. For 24 hours after the procedure, or as directed by your health care provider: ? Do not flex or bend the affected arm. ? Do not push or pull heavy objects with the affected arm. ? Do not drive yourself home from the hospital or clinic. You may drive 24 hours after the procedure. ? Do not operate machinery or power tools. 2. Do not push, pull or lift anything that is heavier  than 10 lb for 5 days. 3. Ask your health care provider when it is okay to: ? Return to work or school. ? Resume usual physical activities or sports. ? Resume sexual activity. General instructions  If the catheter site starts to bleed, raise your arm and put firm pressure on the site. If the bleeding does not stop, get help right away. This is a medical emergency.  If you went home on the same day as your procedure, a responsible adult should be with you for the first 24 hours after you arrive home.  Keep all follow-up visits as told by your health care provider. This is important. Contact a health care provider if:  You have a fever.  You have redness, swelling, or yellow drainage around your insertion site. Get help right away if:  You have unusual pain at the radial site.  The catheter insertion area swells very fast.  The insertion area is bleeding, and the bleeding does not stop when you hold steady pressure on the area.  Your arm or hand becomes pale, cool, tingly, or numb. These symptoms may represent a serious problem that is an emergency. Do not wait to see if the symptoms will go away. Get medical help right away. Call your local emergency services (911 in the [place] Do not drive yourself to the hospital. Summary  After the procedure, it is common to have bruising and tenderness  at the site.  Follow instructions from your health care provider about how to take care of your radial site wound. Check the wound every day for signs of infection.  Do not push, pull or lift anything that is heavier than 10 lb for 5 days.  This information is not intended to replace advice given to you by your health care provider. Make sure you discuss any questions you have with your health care provider. Document Revised: 04/22/2017 Document Reviewed: 04/22/2017 Elsevier Patient Education  2020 [employer].

## 2019-08-08 NOTE — CV Procedure (Signed)
   Right and left heart cath with coronary angiography via right antecubital vein and right radial access.  Real-time vascular ultrasound used for arterial access.  Right heart cath is normal wedge pressure 16 mmHg V wave 26 mmHg normal pulmonary pressures.  LVEDP 13 mmHg.  Hyperdynamic left ventricle.  Transaortic valve gradient 29 mmHg peak to peak.  Calculated aortic valve area 0.81 cm  Right dominant coronary anatomy.  Significant focal areas of calcification proximal to distal.  Previously placed mid to distal RCA stent is widely patent.  Left main is short but widely patent.  Proximal LAD contains saccular aneurysm, not significantly different than prior angiogram performed.  The aneurysm is not significantly changed compared to 2009 and 2012.  No obvious explanation found for significant exertional dyspnea.

## 2019-08-17 ENCOUNTER — Other Ambulatory Visit: Payer: Self-pay

## 2019-08-17 ENCOUNTER — Ambulatory Visit: Payer: Medicare Other | Admitting: Orthotics

## 2019-08-17 DIAGNOSIS — M2011 Hallux valgus (acquired), right foot: Secondary | ICD-10-CM | POA: Diagnosis not present

## 2019-08-17 DIAGNOSIS — M2042 Other hammer toe(s) (acquired), left foot: Secondary | ICD-10-CM

## 2019-08-17 DIAGNOSIS — E1151 Type 2 diabetes mellitus with diabetic peripheral angiopathy without gangrene: Secondary | ICD-10-CM

## 2019-08-17 DIAGNOSIS — M2041 Other hammer toe(s) (acquired), right foot: Secondary | ICD-10-CM | POA: Diagnosis not present

## 2019-08-17 DIAGNOSIS — M2012 Hallux valgus (acquired), left foot: Secondary | ICD-10-CM | POA: Diagnosis not present

## 2019-08-17 NOTE — Progress Notes (Signed)
CARDIOLOGY OFFICE NOTE  Date:  08/24/2019    Cheryl Hernandez Date of Birth: 1940/11/22 Medical Record [id number]  PCP:  Cheryl Solian, MD  Cardiologist:  Cheryl Hernandez  Chief Complaint  Patient presents with  . Follow-up    History of Present Illness: Cheryl Hernandez is a 79 y.o. female who presents today for a post cath visit. Seen for Dr. Tamala Hernandez.   She has a history of calcific aortic stenosis,CAD, DES RCA 2 2009, paroxysmal palpitations, hypertension, hyperlipidemia, moderate aortic stenosis, and diabetes mellitus type 2.  Seen back at the end of April by Dr. Tamala Hernandez - noted significant change in her exercise tolerance over the past year and shortness of breath. Originally had plans for cardiac cath a year ago - but she was worried about COVID exposure in the hospital and cancelled. Due to persistent symptoms - cardiac cath was again recommended and the patient followed thru.   The patient does not have symptoms concerning for COVID-19 infection (fever, chills, cough, or new shortness of breath).   Comes in today. Here alone. She is frustrated with the findings. Subsequently seen with Dr. Tamala Hernandez today here in the office. She remains very short of breath with any activity - starts breathing too hard - has to stop and rest. She is not as active as she feels she can be due to the breathing. She remains on her Symbicort - this may help lessen her bouts of bronchitis. She is currently on Zithromax for a productive cough. No chest pain. She has not had recent PFTs that we can see. She will have some occasional pedal edema. She does restrict her salt. Notes more varicose veins as well.   Past Medical History:  Diagnosis Date  . Aortic stenosis    a. Mod AS by echo 08/2014.  . Arthritis   . Asthma   . CAD (coronary artery disease)    a. NSTEMI 2009 s/p DES to RCAx2.  . Carotid artery disease ([place])    a. Mild-moderate - followed by [facility].  . Chronic diarrhea   . Diabetes mellitus  ([place])   . Diabetes mellitus without complication ([place])   . Diverticulosis   . GERD (gastroesophageal reflux disease)   . HTN (hypertension)   . Hyperlipidemia   . Myocardial infarction ([place])   . Obesity   . Paroxysmal ventricular tachycardia ([place])   . Rhinitis   . Rotator cuff tear     Past Surgical History:  Procedure Laterality Date  . ANGIOPLASTY    . CORONARY ANGIOPLASTY WITH STENT PLACEMENT    . RIGHT/LEFT HEART CATH AND CORONARY ANGIOGRAPHY N/A 08/08/2019   Procedure: RIGHT/LEFT HEART CATH AND CORONARY ANGIOGRAPHY;  Surgeon: Belva Crome, MD;  Location: [facility];  Service: Cardiovascular;  Laterality: N/A;  . TONSILLECTOMY    . TOTAL ABDOMINAL HYSTERECTOMY    . TOTAL HIP ARTHROPLASTY       Medications: Current Meds  Medication Sig  . albuterol (VENTOLIN HFA) 108 (90 Base) MCG/ACT inhaler Inhale 2 puffs into the lungs every 6 (six) hours as needed for wheezing or shortness of breath.  Marland Kitchen aspirin EC 81 MG tablet Take 162 mg by mouth daily.   Marland Kitchen azithromycin (ZITHROMAX) 250 MG tablet Zpack taper as directed  . B Complex-C (B-COMPLEX WITH VITAMIN C) tablet Take 1 tablet by mouth 2 (two) times a week.  . budesonide-formoterol (SYMBICORT) 80-4.5 MCG/ACT inhaler Inhale 2 puffs into the lungs 2 (two) times daily.  Marland Kitchen  BYSTOLIC 5 MG tablet TAKE 1 TABLET(5 MG) BY MOUTH DAILY  . calcium carbonate (TUMS - DOSED IN MG ELEMENTAL CALCIUM) 500 MG chewable tablet Chew 1 tablet by mouth 3 (three) times daily as needed. For heartburn.  . Evolocumab (REPATHA SURECLICK) [id number] MG/ML SOAJ Inject 1 pen into the skin every 14 (fourteen) days.  . fexofenadine (ALLEGRA) 180 MG tablet Take 180 mg by mouth daily.    . Glucose Blood (FREESTYLE LITE TEST VI) Once daily  . metFORMIN (GLUCOPHAGE-XR) 500 MG 24 hr tablet Take 500 mg by mouth daily.  . Multiple Vitamins-Minerals (MULTIVITAMIN WITH MINERALS) tablet Take 1 tablet by mouth daily.    . Omega-3 1000 MG CAPS Take 1,000 mg by mouth daily.    Marland Kitchen OVER THE COUNTER MEDICATION Take 1 capsule by mouth daily. Circulation and vein  . Respiratory Therapy Supplies (FLUTTER) DEVI Use flutter valve 1 times daily as needed  . Turmeric 400 MG CAPS Take 400 mg by mouth daily.  . [DISCONTINUED] nitroGLYCERIN (NITROSTAT) 0.4 MG SL tablet Place 1 tablet (0.4 mg total) under the tongue every 5 (five) minutes as needed for chest pain. Please make yearly appt for December.     Allergies: Allergies  Allergen Reactions  . Cefuroxime Axetil Hives and Itching  . Cephalexin Other (See Comments)    Pt. Doesn't remember reaction Total body pain in joints, couldn't swallow   . Codeine     Tolerates Dilaudid.  . Doxycycline     REACTION: vomiting/ GI upset  . Erythromycin Base Other (See Comments)    unknown    Social History: The patient  reports that she quit smoking about 21 years ago. Her smoking use included cigarettes. She has a 30.00 pack-year smoking history. She has never used smokeless tobacco. She reports current alcohol use. She reports that she does not use drugs.   Family History: The patient's family history includes Allergies in her mother; Heart attack in her father; Hyperlipidemia in her mother; Prostate cancer in her maternal grandfather.   Review of Systems: Please see the history of present illness.   All other systems are reviewed and negative.   Physical Exam: VS:  BP 138/68   Pulse 77   Ht 5\' 3"  (1.6 m)   Wt 148 lb (67.1 kg)   SpO2 97%   BMI 26.22 kg/m  .  BMI Body mass index is 26.22 kg/m.  Wt Readings from Last 3 Encounters:  08/24/19 148 lb (67.1 kg)  08/08/19 142 lb (64.4 kg)  07/25/19 147 lb (66.7 kg)    General: Alert and in no acute distress.   HEENT: Normal.  Neck: Supple, no JVD, carotid bruits, or masses noted.  Cardiac: Regular rate and rhythm. Harsh outflow murmur heard thru out the precordium. No significant edema noted.  Respiratory:  Lungs are clear to auscultation bilaterally with normal  work of breathing.  GI: Soft and nontender.  MS: No deformity or atrophy. Gait and ROM intact.  Skin: Warm and dry. Color is normal.  Neuro:  Strength and sensation are intact and no gross focal deficits noted.  Psych: Alert, appropriate and with normal affect.   LABORATORY DATA:  EKG:  EKG is not ordered today.   Lab Results  Component Value Date   WBC 9.4 07/25/2019   HGB 12.2 08/08/2019   HCT 36.0 08/08/2019   PLT 263 07/25/2019   GLUCOSE 90 07/25/2019   CHOL 150 07/04/2019   TRIG 100 07/04/2019   HDL 68 07/04/2019  LDLCALC 64 07/04/2019   ALT 16 07/04/2019   AST 22 07/04/2019   NA 141 08/08/2019   K 3.7 08/08/2019   CL 102 07/25/2019   CREATININE 0.71 07/25/2019   BUN 16 07/25/2019   CO2 23 07/25/2019   TSH 1.76 12/15/2014   INR 1.1 (H) 10/06/2014   HGBA1C (H) 12/11/2007    6.2 (NOTE)   The ADA recommends the following therapeutic goal for glycemic   control related to Hgb A1C measurement:   Goal of Therapy:   < 7.0% Hgb A1C   Reference: American Diabetes Association: Clinical Practice   Recommendations 2008, Diabetes Care,  2008, 31:(Suppl 1).     BNP (last 3 results) No results for input(s): BNP in the last 8760 hours.  ProBNP (last 3 results) Recent Labs    07/25/19 0923  PROBNP 183     Other Studies Reviewed Today:  RIGHT/LEFT HEART CATH AND CORONARY ANGIOGRAPHY 08/08/2019  Conclusion   Moderate aortic stenosis with peak to peak gradient of 33 mmHg, mean gradient 29 mmHg, and calculated aortic valve area of 0.81 cm.  Normal pulmonary pressures with mean arterial pressure of 23 mmHg.  3 mmHg gradient across the mitral valve  Normal left ventricular systolic function with EF 65%.  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  Luminal irregularities are noted throughout the LAD territory.  Luminal irregularities noted throughout the  circumflex.  The RCA is dominant, tortuous, and contains a patent mid to distal stent beyond several acute bends.  Diffuse luminal irregularities are noted otherwise.  Scattered significant calcification is noted in the coronary arterial tree, the thoracic aorta, and mitral annulus.  RECOMMENDATIONS:   There is discordance between aortic stenosis and coronary disease severity and symptoms.  Dyspnea on exertion may be multifactorial.  LAD aneurysm is stable.  Consider referral to aortic valve clinic for an opinion.  May need pulmonary evaluation.  May need transesophageal echo to exclude the possibility of mitral valve disease being more significant than suggested by current data.    ECHOCARDIOGRAM 12/2018: IMPRESSIONS  1. Left ventricular ejection fraction, by visual estimation, is 60 to  65%. The left ventricle has normal function. Normal left ventricular size.  There is moderately increased left ventricular hypertrophy.  2. Left ventricular diastolic Doppler parameters are consistent with  pseudonormalization pattern of LV diastolic filling.  3. Global right ventricle has normal systolic function.The right  ventricular size is normal. No increase in right ventricular wall  thickness.  4. Left atrial size was mildly dilated.  5. Right atrial size was normal.  6. Severe mitral annular calcification.  7. The mitral valve is degenerative. Mild mitral valve regurgitation.  Mild mitral stenosis.  8. Mean mitral valve gradient 51mmHg.  9. The tricuspid valve is normal in structure. Tricuspid valve  regurgitation is mild.  10. Aortic valve mean gradient measures 24.5 mmHg.  11. Aortic valve peak gradient measures 41.2 mmHg.  12. The aortic valve is normal in structure. Aortic valve regurgitation  was not visualized by color flow Doppler. Moderate aortic valve stenosis.  13. There is Moderate calcification of the aortic valve.  14. There is Moderate thickening of the  aortic valve.  15. The pulmonic valve was normal in structure. Pulmonic valve  regurgitation is not visualized by color flow Doppler.  16. Moderately elevated pulmonary artery systolic pressure.  17. The inferior vena cava is normal in size with greater than  50%  respiratory variability, suggesting right atrial pressure of 3 mmHg.    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 Elsworth Soho from 01/2019 visit that he felt this was more related to her AS and not her asthma - we have reviewed her situation with Dr. Burt Knack here in the office today - there is concern that this is low flow low gradient AV - SV measurement is quite low from her prior echo. We are arranging TAVR consult with Dr. Burt Knack. Will also get her PFTs updated - ask her to touch base with Dr. Elsworth Soho following those studies.    2. CAD - no active chest pain - she is to continue with medical management.   3. HTN - BP is fine today on her current regimen. No changes made.   4. HLD - on PSCK9 therapy. LDL is 64.   5. DM - per PCP  6. Asthma   7. Lower extremity edema - needs salt restriction, compression stockings - work up for TAVR to start. For now would hold on diuretics.   8. COVID-19 Education: The signs and symptoms of COVID-19 were discussed with the patient and how to seek care for testing (follow up with PCP or arrange E-visit).  The importance of social distancing, staying at home, hand hygiene and wearing a mask when out in public were discussed today.  Current medicines are reviewed with the patient today.  The patient does not have concerns regarding medicines other than what has been noted above.  The following changes have been made:  See above.  Labs/ tests ordered today include:   No orders of the defined types were placed in this encounter.    Disposition:   FU with Dr. Burt Knack for TAVR consultation. PFTS to be done - we have asked her to touch base with pulmonary again as well  as we undergo this process to ensure that a good outcome can be obtained.    Patient is agreeable to this plan and will call if any problems develop in the interim.   SignedTruitt Merle, NP  08/24/2019 10:16 AM  [place] [address] [place], [place]  [address] Phone: [phone] Fax: [phone]

## 2019-08-24 ENCOUNTER — Telehealth: Payer: Self-pay

## 2019-08-24 ENCOUNTER — Encounter: Payer: Self-pay | Admitting: Nurse Practitioner

## 2019-08-24 ENCOUNTER — Other Ambulatory Visit: Payer: Self-pay

## 2019-08-24 ENCOUNTER — Ambulatory Visit: Payer: Medicare Other | Admitting: Nurse Practitioner

## 2019-08-24 VITALS — BP 138/68 | HR 77 | Ht 63.0 in | Wt 148.0 lb

## 2019-08-24 DIAGNOSIS — I350 Nonrheumatic aortic (valve) stenosis: Secondary | ICD-10-CM | POA: Diagnosis not present

## 2019-08-24 DIAGNOSIS — E7849 Other hyperlipidemia: Secondary | ICD-10-CM | POA: Diagnosis not present

## 2019-08-24 DIAGNOSIS — R0602 Shortness of breath: Secondary | ICD-10-CM | POA: Diagnosis not present

## 2019-08-24 DIAGNOSIS — I25119 Atherosclerotic heart disease of native coronary artery with unspecified angina pectoris: Secondary | ICD-10-CM

## 2019-08-24 DIAGNOSIS — Z7189 Other specified counseling: Secondary | ICD-10-CM

## 2019-08-24 DIAGNOSIS — I10 Essential (primary) hypertension: Secondary | ICD-10-CM

## 2019-08-24 MED ORDER — NITROGLYCERIN 0.4 MG SL SUBL: 0.4000 mg | SUBLINGUAL_TABLET | SUBLINGUAL | 3 refills | Status: AC | PRN

## 2019-08-24 NOTE — Patient Instructions (Addendum)
After Visit Summary:  We will be checking the following labs today - NONE   Medication Instructions:    Continue with your current medicines.    If you need a refill on your cardiac medications before your next appointment, please call your pharmacy.     Testing/Procedures To Be Arranged:  PFTS with diffusion  Follow-Up:   See Dr. Burt Knack for structural heart consult.   We would like for you to touch base with Dr. Elsworth Soho following your breathing tests - let us know if you have trouble making that appointment.     At [facility], you and your health needs are our priority.  As part of our continuing mission to provide you with exceptional heart care, we have created designated Provider Care Teams.  These Care Teams include your primary Cardiologist (physician) and Advanced Practice Providers (APPs -  Physician Assistants and Nurse Practitioners) who all work together to provide you with the care you need, when you need it.  Special Instructions:  . Stay safe, stay home, wash your hands for at least 20 seconds and wear a mask when out in public.  . It was good to talk with you today.  Marland Kitchen Keep restricting your salt.    Call the [place] office at [phone] if you have any questions, problems or concerns.

## 2019-08-24 NOTE — Telephone Encounter (Signed)
Scheduled patient for TAVR consult with Dr. Burt Knack 6/23 at 0900 with Dr. Burt Knack. She was grateful for assistance.

## 2019-08-25 ENCOUNTER — Other Ambulatory Visit: Payer: Self-pay | Admitting: Interventional Cardiology

## 2019-09-06 ENCOUNTER — Other Ambulatory Visit: Payer: Self-pay

## 2019-09-06 ENCOUNTER — Ambulatory Visit: Payer: Medicare Other | Admitting: Orthotics

## 2019-09-06 DIAGNOSIS — M2012 Hallux valgus (acquired), left foot: Secondary | ICD-10-CM

## 2019-09-06 DIAGNOSIS — E1151 Type 2 diabetes mellitus with diabetic peripheral angiopathy without gangrene: Secondary | ICD-10-CM

## 2019-09-06 DIAGNOSIS — M2041 Other hammer toe(s) (acquired), right foot: Secondary | ICD-10-CM

## 2019-09-06 NOTE — Progress Notes (Signed)
Replaceing DBS with orthofeet mary jane with heel strap to tighten counter.

## 2019-09-08 ENCOUNTER — Other Ambulatory Visit (HOSPITAL_COMMUNITY): Payer: Medicare Other

## 2019-09-09 ENCOUNTER — Other Ambulatory Visit (HOSPITAL_COMMUNITY)
Admission: RE | Admit: 2019-09-09 | Discharge: 2019-09-09 | Disposition: A | Discharge status: Home / Self Care | Payer: Medicare Other | Source: Ambulatory Visit | Attending: Nurse Practitioner | Admitting: Nurse Practitioner

## 2019-09-09 DIAGNOSIS — Z20822 Contact with and (suspected) exposure to covid-19: Secondary | ICD-10-CM | POA: Insufficient documentation

## 2019-09-09 DIAGNOSIS — Z01812 Encounter for preprocedural laboratory examination: Secondary | ICD-10-CM | POA: Diagnosis present

## 2019-09-09 LAB — SARS CORONAVIRUS 2 (TAT 6-24 HRS): SARS Coronavirus 2: NEGATIVE

## 2019-09-12 ENCOUNTER — Ambulatory Visit (HOSPITAL_COMMUNITY)
Admission: RE | Admit: 2019-09-12 | Discharge: 2019-09-12 | Disposition: A | Discharge status: Home / Self Care | Payer: Medicare Other | Source: Ambulatory Visit | Attending: Nurse Practitioner | Admitting: Nurse Practitioner

## 2019-09-12 ENCOUNTER — Other Ambulatory Visit: Payer: Self-pay

## 2019-09-12 DIAGNOSIS — R0602 Shortness of breath: Secondary | ICD-10-CM | POA: Diagnosis present

## 2019-09-12 LAB — PULMONARY FUNCTION TEST
DL/VA % pred: 102 %
DL/VA: 4.21 ml/min/mmHg/L
DLCO unc % pred: 83 %
DLCO unc: 15.09 ml/min/mmHg
FEF 25-75 Post: 1.52 L/sec
FEF 25-75 Pre: 1.59 L/sec
FEF2575-%Change-Post: -4 %
FEF2575-%Pred-Post: 112 %
FEF2575-%Pred-Pre: 117 %
FEV1-%Change-Post: 0 %
FEV1-%Pred-Post: 90 %
FEV1-%Pred-Pre: 90 %
FEV1-Post: 1.66 L
FEV1-Pre: 1.68 L
FEV1FVC-%Change-Post: -2 %
FEV1FVC-%Pred-Pre: 110 %
FEV6-%Change-Post: 1 %
FEV6-%Pred-Post: 89 %
FEV6-%Pred-Pre: 87 %
FEV6-Post: 2.09 L
FEV6-Pre: 2.06 L
FEV6FVC-%Change-Post: 0 %
FEV6FVC-%Pred-Post: 105 %
FEV6FVC-%Pred-Pre: 105 %
FVC-%Change-Post: 2 %
FVC-%Pred-Post: 84 %
FVC-%Pred-Pre: 82 %
FVC-Post: 2.1 L
FVC-Pre: 2.06 L
Post FEV1/FVC ratio: 79 %
Post FEV6/FVC ratio: 99 %
Pre FEV1/FVC ratio: 82 %
Pre FEV6/FVC Ratio: 100 %
RV % pred: 83 %
RV: 1.94 L
TLC % pred: 83 %
TLC: 4.08 L

## 2019-09-12 MED ORDER — ALBUTEROL SULFATE (2.5 MG/3ML) 0.083% IN NEBU
2.5000 mg | INHALATION_SOLUTION | Freq: Once | RESPIRATORY_TRACT | Status: AC
  Administered 2019-09-12: 2.5 mg via RESPIRATORY_TRACT

## 2019-09-21 ENCOUNTER — Other Ambulatory Visit: Payer: Self-pay

## 2019-09-21 ENCOUNTER — Ambulatory Visit: Payer: Medicare Other | Admitting: Cardiovascular Disease

## 2019-09-21 VITALS — BP 122/64 | HR 86 | Ht 63.0 in | Wt 146.0 lb

## 2019-09-21 DIAGNOSIS — I350 Nonrheumatic aortic (valve) stenosis: Secondary | ICD-10-CM

## 2019-09-21 LAB — BASIC METABOLIC PANEL
BUN/Creatinine Ratio: 22 (ref 12–28)
BUN: 16 mg/dL (ref 8–27)
CO2: 26 mmol/L (ref 20–29)
Calcium: 10.1 mg/dL (ref 8.7–10.3)
Chloride: 100 mmol/L (ref 96–106)
Creatinine, Ser: 0.72 mg/dL (ref 0.57–1.00)
GFR calc Af Amer: 91 mL/min/{1.73_m2} (ref >59)
GFR calc non Af Amer: 79 mL/min/{1.73_m2} (ref >59)
Glucose: 98 mg/dL (ref 65–99)
Potassium: 4.1 mmol/L (ref 3.5–5.2)
Sodium: 138 mmol/L (ref 134–144)

## 2019-09-21 MED ORDER — CLINDAMYCIN HCL 300 MG PO CAPS
ORAL_CAPSULE | ORAL | 6 refills | Status: DC
Start: 2019-09-21 — End: 2019-12-21

## 2019-09-21 NOTE — Progress Notes (Signed)
Cardiology Office Note:    Date:  09/22/2019   ID:  Cheryl Hernandez, DOB [birth year], MRN [id number]  PCP:  [name], MD  [facility] Cardiologist:  Sinclair Grooms, MD  [place] Electrophysiologist:  None   Referring MD: [name], MD   Chief Complaint  Patient presents with  . Shortness of Breath    History of Present Illness:    Cheryl Hernandez is a 79 y.o. female presenting for evaluation of aortic stenosis, referred by Truitt Merle, NP and Dr [name].   She has chronic exertional dyspnea, but reports significant progression over the past year. She is now short of breath with one flight of stairs or walking up a hill/brisk pace. If she walks slow and steady she does well. She also feels her heart beating fast and experiences mild chest tightness. This causes her to sit down and rest. She has rare lightheadedness, no syncope, no exertional lightheadedness. She complains of mild leg edema. She denies orthopnea or PND. She is now limited to low level activity and admits she has had to slow down significantly to avoid symptoms of dyspnea and fatigue.   She has known of a heart murmur for several decades. Most recent echo studies have demonstrated moderate to severe AS. Cardiac cath was done to further evaluate progressive symptoms. This demonstrated patent coronary arteries with saccular aneurysm of the LAD unchanged from prior studies, a patent stent in the RCA, and no obstructive disease. Hemodynamics at cath showed a mean aortic valve gradient of 29 mmHg and calculated AVA 0.81.   The patient is widowed. She has arthritis with hx of hip replacement but doesn't have much current limitation from arthritis. She is functionally independent.   Past Medical History:  Diagnosis Date  . Aortic stenosis    a. Mod AS by echo 08/2014.  . Arthritis   . Asthma   . CAD (coronary artery disease)    a. NSTEMI 2009 s/p DES to RCAx2.  . Carotid artery disease ([place])     a. Mild-moderate - followed by [facility].  . Chronic diarrhea   . Diabetes mellitus ([place])   . Diabetes mellitus without complication ([place])   . Diverticulosis   . GERD (gastroesophageal reflux disease)   . HTN (hypertension)   . Hyperlipidemia   . Myocardial infarction ([place])   . Obesity   . Paroxysmal ventricular tachycardia ([place])   . Rhinitis   . Rotator cuff tear     Past Surgical History:  Procedure Laterality Date  . ANGIOPLASTY    . CORONARY ANGIOPLASTY WITH STENT PLACEMENT    . RIGHT/LEFT HEART CATH AND CORONARY ANGIOGRAPHY N/A 08/08/2019   Procedure: RIGHT/LEFT HEART CATH AND CORONARY ANGIOGRAPHY;  Surgeon: Belva Crome, MD;  Location: [place] [facility];  Service: Cardiovascular;  Laterality: N/A;  . TONSILLECTOMY    . TOTAL ABDOMINAL HYSTERECTOMY    . TOTAL HIP ARTHROPLASTY      Current Medications: Current Meds  Medication Sig  . albuterol (VENTOLIN HFA) 108 (90 Base) MCG/ACT inhaler Inhale 2 puffs into the lungs every 6 (six) hours as needed for wheezing or shortness of breath.  Marland Kitchen aspirin EC 81 MG tablet Take 162 mg by mouth daily.   Marland Kitchen azithromycin (ZITHROMAX) 250 MG tablet Zpack taper as directed  . B Complex-C (B-COMPLEX WITH VITAMIN C) tablet Take 1 tablet by mouth 2 (two) times a week.  . budesonide-formoterol (SYMBICORT) 80-4.5 MCG/ACT inhaler Inhale 2 puffs into the lungs 2 (  two) times daily.  Marland Kitchen BYSTOLIC 5 MG tablet TAKE ONE TABLET BY MOUTH DAILY  . calcium carbonate (TUMS - DOSED IN MG ELEMENTAL CALCIUM) 500 MG chewable tablet Chew 1 tablet by mouth 3 (three) times daily as needed. For heartburn.  . Evolocumab (REPATHA SURECLICK) 536 MG/ML SOAJ Inject 1 pen into the skin every 14 (fourteen) days.  . fexofenadine (ALLEGRA) 180 MG tablet Take 180 mg by mouth daily.    . Glucose Blood (FREESTYLE LITE TEST VI) Once daily  . metFORMIN (GLUCOPHAGE-XR) 500 MG 24 hr tablet Take 500 mg by mouth daily.  . Multiple Vitamins-Minerals (MULTIVITAMIN WITH MINERALS) tablet Take 1  tablet by mouth daily.    . nitroGLYCERIN (NITROSTAT) 0.4 MG SL tablet Place 1 tablet (0.4 mg total) under the tongue every 5 (five) minutes as needed for chest pain.  . Omega-3 1000 MG CAPS Take 1,000 mg by mouth daily.  Marland Kitchen OVER THE COUNTER MEDICATION Take 1 capsule by mouth daily. Circulation and vein  . Respiratory Therapy Supplies (FLUTTER) DEVI Use flutter valve 1 times daily as needed  . Turmeric 400 MG CAPS Take 400 mg by mouth daily.     Allergies:   Cefuroxime axetil, Cephalexin, Codeine, Doxycycline, and Erythromycin base   Social History   Socioeconomic History  . Marital status: Widowed    Spouse name: Not on file  . Number of children: Not on file  . Years of education: Not on file  . Highest education level: Not on file  Occupational History  . Occupation: retired Therapist, sports  Tobacco Use  . Smoking status: Former Smoker    Packs/day: 1.00    Years: 30.00    Pack years: 30.00    Types: Cigarettes    Quit date: 03/31/1998    Years since quitting: 21.4  . Smokeless tobacco: Never Used  . Tobacco comment: Quit intermittently since her 4s.  Vaping Use  . Vaping Use: Never used  Substance and Sexual Activity  . Alcohol use: Yes    Alcohol/week: 0.0 standard drinks    Comment: Rare.  . Drug use: No  . Sexual activity: Not on file  Other Topics Concern  . Not on file  Social History Narrative   Originally from [place]. She has traveled to [place], [place], [place], & [place]. Previously worked as a Marine scientist. No known TB exposure. No pets currently. No bird, hot tube, or asbestos exposure. Enjoys playing bridge.    Social Determinants of Health   Financial Resource Strain:   . Difficulty of Paying Living Expenses:   Food Insecurity:   . Worried About Charity fundraiser in the Last Year:   . Arboriculturist in the Last Year:   Transportation Needs:   . Film/video editor (Medical):   Marland Kitchen Lack of Transportation (Non-Medical):   Physical Activity:   . Days of Exercise per Week:   .  Minutes of Exercise per Session:   Stress:   . Feeling of Stress :   Social Connections:   . Frequency of Communication with Friends and Family:   . Frequency of Social Gatherings with Friends and Family:   . Attends Religious Services:   . Active Member of Clubs or Organizations:   . Attends Archivist Meetings:   Marland Kitchen Marital Status:      Family History: The patient's family history includes Allergies in her mother; Heart attack in her father; Hyperlipidemia in her mother; Prostate cancer in her maternal grandfather. There is no  history of Lung disease.  ROS:   Please see the history of present illness.    All other systems reviewed and are negative.  EKGs/Labs/Other Studies Reviewed:    The following studies were reviewed today: Echo 01-20-2019: FINDINGS  Left Ventricle: Left ventricular ejection fraction, by visual estimation,  is 60 to 65%. The left ventricle has normal function. There is moderately  increased left ventricular hypertrophy. Normal left ventricular size.  Spectral Doppler shows Left  ventricular diastolic Doppler parameters are consistent with  pseudonormalization pattern of LV diastolic filling.   Right Ventricle: The right ventricular size is normal. No increase in  right ventricular wall thickness. Global RV systolic function is has  normal systolic function. The tricuspid regurgitant velocity is 2.78 m/s,  and with an assumed right atrial pressure  of 10 mmHg, the estimated right ventricular systolic pressure is  moderately elevated at 40.8 mmHg.   Left Atrium: Left atrial size was mildly dilated.   Right Atrium: Right atrial size was normal in size   Pericardium: There is no evidence of pericardial effusion.   Mitral Valve: The mitral valve is degenerative in appearance. There is  mild thickening of the mitral valve leaflet(s). There is mild  calcification of the mitral valve leaflet(s). Severe mitral annular  calcification. Mild mitral  valve stenosis by  observation. MV peak gradient, 16.5 mmHg. Mild mitral valve regurgitation.  Mean mitral valve gradient 68mmHg.   Tricuspid Valve: The tricuspid valve is normal in structure. Tricuspid  valve regurgitation is mild by color flow Doppler.   Aortic Valve: The aortic valve is normal in structure.. There is Moderate  thickening and Moderate calcification of the aortic valve. Aortic valve  regurgitation was not visualized by color flow Doppler. Moderate aortic  stenosis is present. There is  Moderate thickening of the aortic valve. Moderate calcification. Aortic  valve mean gradient measures 24.5 mmHg. Aortic valve peak gradient  measures 41.2 mmHg. Aortic valve area, by VTI measures 1.00 cm.   Pulmonic Valve: The pulmonic valve was normal in structure. Pulmonic valve  regurgitation is not visualized by color flow Doppler.   Aorta: The aortic root, ascending aorta and aortic arch are all  structurally normal, with no evidence of dilitation or obstruction.   Venous: The inferior vena cava is normal in size with greater than 50%  respiratory variability, suggesting right atrial pressure of 3 mmHg.   IAS/Shunts: No atrial level shunt detected by color flow Doppler. No  ventricular septal defect is seen or detected. There is no evidence of an  atrial septal defect.      LEFT VENTRICLE  PLAX 2D  LVIDd:     2.90 cm Diastology  LVIDs:     1.20 cm LV e' lateral:  8.49 cm/s  LV PW:     1.40 cm LV E/e' lateral: 19.8  LV IVS:    1.80 cm LV e' medial:  7.07 cm/s  LVOT diam:   2.00 cm LV E/e' medial: 23.8  LV SV:     29 ml  LV SV Index:  16.50  LVOT Area:   3.14 cm     RIGHT VENTRICLE  RV Basal diam: 2.70 cm  RV S prime:   8.59 cm/s  TAPSE (M-mode): 2.2 cm   LEFT ATRIUM       Index    RIGHT ATRIUM      Index  LA diam:    2.70 cm 1.58 cm/m RA Area:   11.50 cm  LA Vol (  A2C):  69.6 ml 40.67 ml/m RA Volume:   22.40 ml 13.09 ml/m  LA Vol (A4C):  52.9 ml 30.91 ml/m  LA Biplane Vol: 61.1 ml 35.70 ml/m  AORTIC VALVE  AV Area (Vmax):  1.01 cm  AV Area (Vmean):  0.94 cm  AV Area (VTI):   1.00 cm  AV Vmax:      321.00 cm/s  AV Vmean:     231.500 cm/s  AV VTI:      0.750 m  AV Peak Grad:   41.2 mmHg  AV Mean Grad:   24.5 mmHg  LVOT Vmax:     103.00 cm/s  LVOT Vmean:    69.400 cm/s  LVOT VTI:     0.239 m  LVOT/AV VTI ratio: 0.32    AORTA  Ao Root diam: 2.90 cm   MITRAL VALVE             TRICUSPID VALVE  MV Area (PHT): 3.99 cm       TR Peak grad:  30.8 mmHg  MV Peak grad: 16.5 mmHg       TR Vmax:    294.00 cm/s  MV Mean grad: 7.0 mmHg  MV Vmax:    2.03 m/s       SHUNTS  MV Vmean:   118.0 cm/s      Systemic VTI: 0.24 m  MV VTI:    0.49 m        Systemic Diam: 2.00 cm  MV PHT:    55.10 msec  MV Decel Time: 190 msec  MV E velocity: 168.00 cm/s 103 cm/s  MV A velocity: 92.40 cm/s 70.3 cm/s  MV E/A ratio: 1.82    1.5   Cardiac Cath: Conclusion   Moderate aortic stenosis with peak to peak gradient of 33 mmHg, mean gradient 29 mmHg, and calculated aortic valve area of 0.81 cm.  Normal pulmonary pressures with mean arterial pressure of 23 mmHg.  3 mmHg gradient across the mitral valve  Normal left ventricular systolic function with EF 65%.  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  Luminal irregularities are noted throughout the LAD territory.  Luminal irregularities noted throughout the circumflex.  The RCA is dominant, tortuous, and contains a patent mid to distal stent beyond several acute bends.  Diffuse luminal irregularities are noted otherwise.  Scattered significant calcification is noted in the coronary arterial tree, the thoracic aorta, and mitral  annulus.  RECOMMENDATIONS:   There is discordance between aortic stenosis and coronary disease severity and symptoms.  Dyspnea on exertion may be multifactorial.  LAD aneurysm is stable.  Consider referral to aortic valve clinic for an opinion.  May need pulmonary evaluation.  May need transesophageal echo to exclude the possibility of mitral valve disease being more significant than suggested by current data.  Coronary Findings  Diagnostic Dominance: Right Left Anterior Descending  The vessel exhibits minimal luminal irregularities.  Non-stenotic Ost LAD lesion.  Prox LAD to Mid LAD lesion is 40% stenosed.  Left Circumflex  There is mild diffuse disease throughout the vessel.  First Obtuse Marginal Branch  Right Coronary Artery  .  Intervention  No interventions have been documented. Right Heart  Right Heart Pressures Hemodynamic findings consistent with pulmonary hypertension. LV EDP is normal.  Right Atrium The right atrial size is normal.  Wall Motion  Resting  Left Heart  Left Ventricle The left ventricular size is normal. The left ventricular systolic function is normal. LV end diastolic pressure is normal. The left ventricular ejection fraction is 55-65% by visual estimate. No regional wall motion abnormalities. There is no evidence of mitral regurgitation.  Coronary Diagrams  Diagnostic Dominance: Right   EKG:  EKG is not ordered today.  The ekg from 08/08/19 demonstrates NSR 74 bpm, within normal limits  Recent Labs: 07/04/2019: ALT 16 07/25/2019: NT-Pro BNP 183; Platelets 263 08/08/2019: Hemoglobin 12.2 09/21/2019: BUN 16; Creatinine, Ser 0.72; Potassium 4.1; Sodium 138  Recent Lipid Panel    Component Value Date/Time   CHOL 150 07/04/2019 0749   TRIG 100 07/04/2019 0749   HDL 68 07/04/2019 0749   CHOLHDL 2.2 07/04/2019 0749   CHOLHDL 6.3 12/11/2007 0350   VLDL 32 12/11/2007 0350   LDLCALC 64 07/04/2019 0749    Physical Exam:     VS:  BP 122/64   Pulse 86   Ht 5\' 3"  (1.6 m)   Wt 146 lb (66.2 kg)   SpO2 97%   BMI 25.86 kg/m     Wt Readings from Last 3 Encounters:  09/21/19 146 lb (66.2 kg)  08/24/19 148 lb (67.1 kg)  08/08/19 142 lb (64.4 kg)     GEN:  Well nourished, well developed in no acute distress HEENT: Normal NECK: No JVD; No carotid bruits LYMPHATICS: No lymphadenopathy CARDIAC: RRR, 3/6 harsh crescendo decrescendo murmur at the RUSB, no diastolic murmur, diminished A2 RESPIRATORY:  Clear to auscultation without rales, wheezing or rhonchi  ABDOMEN: Soft, non-tender, non-distended MUSCULOSKELETAL:  No edema; No deformity  SKIN: Warm and dry NEUROLOGIC:  Alert and oriented x 3 PSYCHIATRIC:  Normal affect   STS Risk Calculator: PROM: 2.03% Morbidity or Mortality: 10.76%   ASSESSMENT:    1. Nonrheumatic aortic valve stenosis    PLAN:    In order of problems listed above:  I suspect the patient has severe, stage D3 (paradoxical low flow low gradient) aortic stenosis associated with NYHA functional class 3 symptoms of progressive dyspnea/chronic diastolic heart failure. Her last 3 echo studies are reviewed today:  2018    2019    2020 Mean Gradient Mean Gradient       Mean Gradient 28   27         27   LVOT/AV  LVOT/AV        LVOT/AV 0.38   0.35         0.32   The aortic valve is severely calcified and restricted. LV function is vigorous with significant LVH noted. The patient's most recent echo shows a stroke volume index of 0.16 (<35 suggestive of low flow). Her murmur and progressive symptoms are indicative of symptomatic severe AS. I have reviewed the natural history of aortic stenosis with the patient today. We have discussed the limitations of medical therapy and the poor prognosis associated with symptomatic aortic stenosis. We have reviewed potential treatment options, including palliative medical therapy, conventional surgical aortic valve replacement, and transcatheter aortic  valve replacement. Considering her age of 23 years, TAVR would be a reasonable treatment option if her anatomy is conducive. We discussed treatment options in the context of the patient's specific comorbid medical conditions. I have reviewed her recent cath and echo studies. She understands that next steps in evaluation will include an updated echo (last Oct 2020) followed by CTA studies of the heart and the chest, abdomen, and pelvis. Once her studies are completed, she  will be referred for formal surgical consultation as part of a multidisciplinary approach to her care. As part of her consultation today, I demonstrated a TAVR procedural animation and explained the details of the procedure. The potential complications and expected recovery are reviewed with her during this discussion.   Medication Adjustments/Labs and Tests Ordered: Current medicines are reviewed at length with the patient today.  Concerns regarding medicines are outlined above.  Orders Placed This Encounter  Procedures  . CT CORONARY MORPH W/CTA COR W/SCORE W/CA W/CM &/OR WO/CM  . CT ANGIO CHEST AORTA W/CM & OR WO/CM  . CT ANGIO ABDOMEN PELVIS  W &/OR WO CONTRAST  . Basic metabolic panel  . ECHOCARDIOGRAM COMPLETE   Meds ordered this encounter  Medications  . clindamycin (CLEOCIN) 300 MG capsule    Sig: Take 2 capsules (600 mg) 1 hour prior to dental visits.    Dispense:  6 capsule    Refill:  6    Patient Instructions  Medication Instructions:  1)Your provider discussed the importance of taking an antibiotic prior to all dental visits to prevent damage to the heart valves from infection. You were given a prescription for CLINDAMYCIN 600 mg to take one hour prior to any dental appointment.  Your provider recommends that you continue on your current medications as directed. Please refer to the Current Medication list given to you today.   *If you need a refill on your cardiac medications before your next appointment, please  call your pharmacy*  Lab Work: TODAY! BMET If you have labs (blood work) drawn today and your tests are completely normal, you will receive your results only by: Marland Kitchen MyChart Message (if you have MyChart) OR . A paper copy in the mail If you have any lab test that is abnormal or we need to change your treatment, we will call you to review the results.  Testing/Procedures: Your provider has requested that you have an echocardiogram. Echocardiography is a painless test that uses sound waves to create images of your heart. It provides your doctor with information about the size and shape of your heart and how well your heart's chambers and valves are working. This procedure takes approximately one hour. There are no restrictions for this procedure.   Dr. Burt Knack recommends you have CT scans. We will call you to arrange these tests.  Follow-Up: We will call you to arrange all further appointments!    Signed, Sherren Mocha, MD  09/22/2019 6:09 AM    [place]

## 2019-09-21 NOTE — Patient Instructions (Addendum)
Medication Instructions:  1)Your provider discussed the importance of taking an antibiotic prior to all dental visits to prevent damage to the heart valves from infection. You were given a prescription for CLINDAMYCIN 600 mg to take one hour prior to any dental appointment.  Your provider recommends that you continue on your current medications as directed. Please refer to the Current Medication list given to you today.   *If you need a refill on your cardiac medications before your next appointment, please call your pharmacy*  Lab Work: TODAY! BMET If you have labs (blood work) drawn today and your tests are completely normal, you will receive your results only by: Marland Kitchen MyChart Message (if you have MyChart) OR . A paper copy in the mail If you have any lab test that is abnormal or we need to change your treatment, we will call you to review the results.  Testing/Procedures: Your provider has requested that you have an echocardiogram. Echocardiography is a painless test that uses sound waves to create images of your heart. It provides your doctor with information about the size and shape of your heart and how well your heart's chambers and valves are working. This procedure takes approximately one hour. There are no restrictions for this procedure.   Dr. Burt Knack recommends you have CT scans. We will call you to arrange these tests.  Follow-Up: We will call you to arrange all further appointments!

## 2019-09-22 ENCOUNTER — Encounter: Payer: Self-pay | Admitting: Cardiovascular Disease

## 2019-09-27 ENCOUNTER — Telehealth: Payer: Self-pay | Admitting: Cardiovascular Disease

## 2019-09-27 NOTE — Telephone Encounter (Signed)
Patient states that she needs to speak with [facility]. Does not want to leave a message. Aware Valetta Fuller is off today and will be back tomorrow.

## 2019-09-28 NOTE — Telephone Encounter (Signed)
Cheryl Hernandez has rescheduled her echo to 7/9 so she can go to the beach. She is leaving 7/15 and will be gone for 1-2 weeks. She requests her TAVR scans are scheduled the first week of August if possible so she can visit her daughter in [place] later in the month.  To the Structural RN Navigator as FYI.

## 2019-09-29 NOTE — Telephone Encounter (Signed)
I have spoken with the pt and will contact her after Echo is complete and before she goes to the beach with additional appointments for TAVR evaluation.

## 2019-10-07 ENCOUNTER — Other Ambulatory Visit: Payer: Self-pay

## 2019-10-07 ENCOUNTER — Ambulatory Visit (HOSPITAL_COMMUNITY): Payer: Medicare Other | Attending: Internal Medicine

## 2019-10-07 DIAGNOSIS — I350 Nonrheumatic aortic (valve) stenosis: Secondary | ICD-10-CM | POA: Insufficient documentation

## 2019-10-10 ENCOUNTER — Other Ambulatory Visit: Payer: Self-pay

## 2019-10-10 DIAGNOSIS — I350 Nonrheumatic aortic (valve) stenosis: Secondary | ICD-10-CM

## 2019-10-11 ENCOUNTER — Telehealth: Payer: Self-pay | Admitting: Cardiovascular Disease

## 2019-10-11 NOTE — Telephone Encounter (Signed)
Called patient. Tried to answer her questions, but patient had a lot of questions about her testing and TAVR issue and about scheduling. Will send message to Marlin RN to see if she can help.

## 2019-10-11 NOTE — Telephone Encounter (Signed)
New Message  Patient is calling in to speak with nurse Valetta Fuller, would say about what, just wants to speak to her. Please give patient a call back to assist.

## 2019-10-11 NOTE — Telephone Encounter (Signed)
I have spoken with the pt in regards to TAVR testing and letter sent through My Chart.  I also reviewed 7/9 Echo with the pt.

## 2019-10-13 ENCOUNTER — Other Ambulatory Visit (HOSPITAL_COMMUNITY): Payer: Medicare Other

## 2019-10-14 ENCOUNTER — Ambulatory Visit: Payer: Medicare Other | Admitting: Orthotics

## 2019-10-14 ENCOUNTER — Other Ambulatory Visit: Payer: Self-pay

## 2019-10-27 ENCOUNTER — Ambulatory Visit (HOSPITAL_COMMUNITY): Payer: Medicare Other

## 2019-10-31 ENCOUNTER — Other Ambulatory Visit: Payer: Self-pay

## 2019-10-31 ENCOUNTER — Other Ambulatory Visit: Payer: Medicare Other

## 2019-10-31 DIAGNOSIS — I350 Nonrheumatic aortic (valve) stenosis: Secondary | ICD-10-CM

## 2019-11-01 LAB — BASIC METABOLIC PANEL
BUN/Creatinine Ratio: 18 (ref 12–28)
BUN: 13 mg/dL (ref 8–27)
CO2: 26 mmol/L (ref 20–29)
Calcium: 10.1 mg/dL (ref 8.7–10.3)
Chloride: 104 mmol/L (ref 96–106)
Creatinine, Ser: 0.73 mg/dL (ref 0.57–1.00)
GFR calc Af Amer: 90 mL/min/{1.73_m2} (ref >59)
GFR calc non Af Amer: 78 mL/min/{1.73_m2} (ref >59)
Glucose: 121 mg/dL — ABNORMAL HIGH (ref 65–99)
Potassium: 4.3 mmol/L (ref 3.5–5.2)
Sodium: 143 mmol/L (ref 134–144)

## 2019-11-02 ENCOUNTER — Ambulatory Visit: Payer: Medicare Other | Admitting: Physical Therapy

## 2019-11-02 ENCOUNTER — Ambulatory Visit (HOSPITAL_COMMUNITY): Payer: Medicare Other

## 2019-11-02 ENCOUNTER — Encounter (HOSPITAL_COMMUNITY): Payer: Medicare Other

## 2019-11-03 ENCOUNTER — Ambulatory Visit (HOSPITAL_COMMUNITY)
Admission: RE | Admit: 2019-11-03 | Discharge: 2019-11-03 | Disposition: A | Discharge status: Home / Self Care | Payer: Medicare Other | Source: Ambulatory Visit | Attending: Cardiovascular Disease | Admitting: Cardiovascular Disease

## 2019-11-03 ENCOUNTER — Ambulatory Visit: Payer: Medicare Other | Attending: Cardiovascular Disease | Admitting: Physical Therapy

## 2019-11-03 ENCOUNTER — Encounter: Payer: Self-pay | Admitting: Physical Therapy

## 2019-11-03 ENCOUNTER — Ambulatory Visit (HOSPITAL_COMMUNITY): Payer: Medicare Other

## 2019-11-03 ENCOUNTER — Other Ambulatory Visit: Payer: Self-pay

## 2019-11-03 DIAGNOSIS — I350 Nonrheumatic aortic (valve) stenosis: Secondary | ICD-10-CM | POA: Insufficient documentation

## 2019-11-03 DIAGNOSIS — R2689 Other abnormalities of gait and mobility: Secondary | ICD-10-CM | POA: Insufficient documentation

## 2019-11-03 MED ORDER — IOHEXOL 350 MG/ML SOLN
100.0000 mL | Freq: Once | INTRAVENOUS | Status: AC | PRN
  Administered 2019-11-03: 100 mL via INTRAVENOUS

## 2019-11-03 NOTE — Progress Notes (Signed)
VASCULAR LAB    Carotid duplex completed.    Preliminary report:  See CV proc for preliminary results.  Dedric Ethington, RVT 11/03/2019, 9:23 AM

## 2019-11-03 NOTE — Therapy (Signed)
[place], [place], [address] Phone: [phone]   Fax:  [phone]  Physical Therapy Evaluation  Patient Details  Name: Cheryl Hernandez MRN: [id number] Date of Birth: Sep 17, 1940 Referring Provider (PT): Sherren Mocha MD   Encounter Date: 11/03/2019   PT End of Session - 11/03/19 1329    Visit Number 1    Number of Visits 1    Date for PT Re-Evaluation 11/04/19    PT Start Time 6712    PT Stop Time 1401    PT Time Calculation (min) 32 min    Activity Tolerance Patient tolerated treatment well    Behavior During Therapy [facility] for tasks assessed/performed           Past Medical History:  Diagnosis Date  . Aortic stenosis    a. Mod AS by echo 08/2014.  . Arthritis   . Asthma   . CAD (coronary artery disease)    a. NSTEMI 2009 s/p DES to RCAx2.  . Carotid artery disease ([place])    a. Mild-moderate - followed by [facility].  . Chronic diarrhea   . Diabetes mellitus ([place])   . Diabetes mellitus without complication ([place])   . Diverticulosis   . GERD (gastroesophageal reflux disease)   . HTN (hypertension)   . Hyperlipidemia   . Myocardial infarction ([place])   . Obesity   . Paroxysmal ventricular tachycardia ([place])   . Rhinitis   . Rotator cuff tear     Past Surgical History:  Procedure Laterality Date  . ANGIOPLASTY    . CORONARY ANGIOPLASTY WITH STENT PLACEMENT    . RIGHT/LEFT HEART CATH AND CORONARY ANGIOGRAPHY N/A 08/08/2019   Procedure: RIGHT/LEFT HEART CATH AND CORONARY ANGIOGRAPHY;  Surgeon: Belva Crome, MD;  Location: [facility];  Service: Cardiovascular;  Laterality: N/A;  . TONSILLECTOMY    . TOTAL ABDOMINAL HYSTERECTOMY    . TOTAL HIP ARTHROPLASTY      There were no vitals filed for this visit.    Subjective Assessment - 11/03/19 1334    Subjective pt is 79 y.o F with CC of SOB, fatigue and light headed and repor tof chest tightness but denies pain that been going on for a  couple years and has progressively worsned since onset. She reprots hx of bil knee pain that has been present for a few years.    Currently in Pain? Yes    Pain Score 5     Pain Location Knee    Pain Orientation Right;Left              [facility] PT Assessment - 11/03/19 1329      Assessment   Medical Diagnosis Severe Aortic Stenosis    Referring Provider (PT) Sherren Mocha MD    Onset Date/Surgical Date --   a few years ago   Hand Dominance Right      Precautions   Precautions None      Restrictions   Weight Bearing Restrictions No      Balance Screen   Has the patient fallen in the past 6 months No    Has the patient had a decrease in activity level because of a fear of falling?  No    Is the patient reluctant to leave their home because of a fear of falling?  No      Home Environment   Living Environment Private residence    Living Arrangements Alone    Type of [place]  Home Access Stairs to enter    Entrance Stairs-Number of Steps 6    Entrance Stairs-Rails Right   ascending   Home Layout One level    [place] - 2 wheels;Cane - single point      Prior Function   Level of Independence Independent      ROM / Strength   AROM / PROM / Strength AROM;Strength      AROM   Overall AROM  Within functional limits for tasks performed      Strength   Overall Strength Within functional limits for tasks performed    Right Hand Grip (lbs) 38    Left Hand Grip (lbs) 35      Ambulation/Gait   Ambulation/Gait Yes    Gait Pattern Step-through pattern;Decreased stride length    Gait Comments pt ambulated 370 ft before requiring seated rest break  at 1:35 lasting 1:40 seconds HR 120 and O2 92%, pt ambulated an addiitional 370 ft requiring seated rest break at 5:05 lasting remainder of testing HR 121 O2 93%            [facility] Pre-Surgical Assessment - 11/03/19 0001    5 Meter Walk Test- trial 1 4 sec    5 Meter Walk Test- trial 2 3 sec.     5 Meter Walk  Test- trial 3 4 sec.    5 meter walk test average 3.67 sec    Comment unable to stand without using hands    ADL/IADL Independent with: Bathing;Dressing;Meal prep;Finances    ADL/IADL Needs Assistance with: [employer] Leaver work    ADL/IADL Fraility Index Midly frail    6 Minute Walk- Baseline yes    BP (mmHg) 146/63    HR (bpm) 85    02 Sat (%RA) 97 %    Modified Borg Scale for Dyspnea 2- Mild shortness of breath    Perceived Rate of Exertion (Borg) 7- Very, very light    6 Minute Walk Post Test yes    BP (mmHg) 168/79    HR (bpm) 121    02 Sat (%RA) 93 %    Modified Borg Scale for Dyspnea 5- Strong or hard breathing    Perceived Rate of Exertion (Borg) 15- Hard    Aerobic Endurance Distance Walked 740    Endurance additional comments pt is 42.46% limited compared to age related norm                    Objective measurements completed on examination: See above findings.                            Plan - 11/03/19 1329    Clinical Impression Statement see assessment in note    Stability/Clinical Decision Making Stable/Uncomplicated    Clinical Decision Making Low    PT Frequency One time visit    PT Next Visit Plan Pre-TAVR evaluation          Clinical Impression Statement: Pt is a 79 yo F presenting to OP PT for evaluation prior to possible TAVR surgery due to severe aortic stenosis. Pt reports onset of SOB, Chst tightness, and light headedness approximately 2 years with progressive worsening. Symptoms are limiting endurance. Pt presents with good ROM and strength, good balance and is assessed as low at high fall risk 4 stage balance test, good walking speed and limited aerobic endurance per 6 minute walk test. pt ambulated 370 ft before requiring  seated rest break  at 1:35 lasting 1:40 seconds HR 120 and O2 92%, pt ambulated an addiitional 370 ft requiring seated rest break at 5:05 lasting remainder of testing HR 121 O2 93%on room air. Pt reported 5/10  shortness of breath on modified scale for dyspnea. Pt ambulated a total of 740 feet in 6 minute walk. SOB and fatigue increased significantly with 6 minute walk test. Based on the Short Physical Performance Battery, patient has a frailty rating of 8/12 with </= 5/12 considered frail.    Patient demonstrated the following deficits and impairments:     Visit Diagnosis: Other abnormalities of gait and mobility     Problem List Patient Active Problem List   Diagnosis Date Noted  . Osteoarthritis of knee 10/15/2017  . Allergic rhinitis 09/19/2015  . Dyspnea 12/15/2014  . Obesity   . Coronary artery disease involving native coronary artery of native heart with angina pectoris ([place])   . Aortic stenosis   . DIVERTICULOSIS OF COLON 11/13/2009  . CONSTIPATION, CHRONIC 11/13/2009  . OSTEOPENIA 11/13/2009  . MENIERE'S DISEASE, HX OF 11/13/2009  . COLONIC POLYPS, HX OF 11/13/2009  . Asthma, moderate persistent 11/23/2007  . Paroxysmal tachycardia ([place]) 09/30/2007  . GERD, SEVERE 09/15/2007  . Type 2 diabetes mellitus without complication, without long-term current use of insulin ([place]) 09/14/2007  . Hyperlipidemia 09/13/2007  . Essential hypertension 09/13/2007   [name] [place] PT, DPT, LAT, ATC  11/03/19  2:09 PM      [facility] [address] [place], [place], [address] Phone: [phone]   Fax:  [phone]  Name: Cheryl Hernandez MRN: [id number] Date of Birth: 1941/02/16

## 2019-11-08 ENCOUNTER — Telehealth: Payer: Self-pay | Admitting: *Deleted

## 2019-11-08 NOTE — Telephone Encounter (Signed)
S/w pt per Truitt Merle, NP to see why pt was scheduled an appt tomorrow.  Pt is getting worked up for a [employer].  Pt stated "needed someone to go over all the tests with pt".  Stated did someone not call you with results, pt stated has been reading about the tests.  Stated per Cecille Rubin this will happen as the process moves along.  Pt stated don't bother and hung up.

## 2019-11-08 NOTE — Progress Notes (Deleted)
CARDIOLOGY OFFICE NOTE  Date:  11/08/2019    [name] Date of Birth: 1940/04/04 Medical Record #[id number]  PCP:  [name], MD  Cardiologist:  Servando Snare & ***    No chief complaint on file.   History of Present Illness: Cheryl Hernandez is a 79 y.o. female who presents today for a ***  Seen for Dr. [name].   She has a history of calcific aortic stenosis,CAD, DES RCA 2 2009, paroxysmal palpitations, hypertension, hyperlipidemia, moderate aortic stenosis, and diabetes mellitus type 2.  Seen back at the end of April by Dr. [name] - noted significant change in her exercise tolerance over the past year and shortness of breath. Originally had plans for cardiac cath a year ago - but she was worried about COVID exposure in the hospital and cancelled. Due to persistent symptoms - cardiac cath was again recommended and the patient followed thru.   The patient does not have symptoms concerning for COVID-19 infection (fever, chills, cough, or new shortness of breath).   Comes in today. Here alone. She is frustrated with the findings. Subsequently seen with Dr. [name] today here in the office. She remains very short of breath with any activity - starts breathing too hard - has to stop and rest. She is not as active as she feels she can be due to the breathing. She remains on her Symbicort - this may help lessen her bouts of bronchitis. She is currently on Zithromax for a productive cough. No chest pain. She has not had recent PFTs that we can see. She will have some occasional pedal edema. She does restrict her salt. Notes more varicose veins as well.   Comes in today. Here with   Past Medical History:  Diagnosis Date  . Aortic stenosis    a. Mod AS by echo 08/2014.  . Arthritis   . Asthma   . CAD (coronary artery disease)    a. NSTEMI 2009 s/p DES to RCAx2.  . Carotid artery disease ([place])    a. Mild-moderate - followed by [facility].  . Chronic diarrhea   . Diabetes  mellitus ([place])   . Diabetes mellitus without complication ([place])   . Diverticulosis   . GERD (gastroesophageal reflux disease)   . HTN (hypertension)   . Hyperlipidemia   . Myocardial infarction ([place])   . Obesity   . Paroxysmal ventricular tachycardia ([place])   . Rhinitis   . Rotator cuff tear     Past Surgical History:  Procedure Laterality Date  . ANGIOPLASTY    . CORONARY ANGIOPLASTY WITH STENT PLACEMENT    . RIGHT/LEFT HEART CATH AND CORONARY ANGIOGRAPHY N/A 08/08/2019   Procedure: RIGHT/LEFT HEART CATH AND CORONARY ANGIOGRAPHY;  Surgeon: Belva Crome, MD;  Location: [facility];  Service: Cardiovascular;  Laterality: N/A;  . TONSILLECTOMY    . TOTAL ABDOMINAL HYSTERECTOMY    . TOTAL HIP ARTHROPLASTY       Medications: No outpatient medications have been marked as taking for the 11/09/19 encounter (Appointment) with Burtis Junes, NP.     Allergies: Allergies  Allergen Reactions  . Cefuroxime Axetil Hives and Itching  . Cephalexin Other (See Comments)    Pt. Doesn't remember reaction Total body pain in joints, couldn't swallow   . Codeine     Tolerates Dilaudid.  . Doxycycline     REACTION: vomiting/ GI upset  . Erythromycin Base Other (See Comments)    unknown    Social History:  The patient  reports that she quit smoking about 21 years ago. Her smoking use included cigarettes. She has a 30.00 pack-year smoking history. She has never used smokeless tobacco. She reports current alcohol use. She reports that she does not use drugs.   Family History: The patient's ***family history includes Allergies in her mother; Heart attack in her father; Hyperlipidemia in her mother; Prostate cancer in her maternal grandfather.   Review of Systems: Please see the history of present illness.   All other systems are reviewed and negative.   Physical Exam: VS:  There were no vitals taken for this visit. Marland Kitchen  BMI There is no height or weight on file to calculate  BMI.  Wt Readings from Last 3 Encounters:  09/21/19 146 lb (66.2 kg)  08/24/19 148 lb (67.1 kg)  08/08/19 142 lb (64.4 kg)    General: Pleasant. Well developed, well nourished and in no acute distress.   HEENT: Normal.  Neck: Supple, no JVD, carotid bruits, or masses noted.  Cardiac: ***Regular rate and rhythm. No murmurs, rubs, or gallops. No edema.  Respiratory:  Lungs are clear to auscultation bilaterally with normal work of breathing.  GI: Soft and nontender.  MS: No deformity or atrophy. Gait and ROM intact.  Skin: Warm and dry. Color is normal.  Neuro:  Strength and sensation are intact and no gross focal deficits noted.  Psych: Alert, appropriate and with normal affect.   LABORATORY DATA:  EKG:  EKG {ACTION; IS/IS QMG:[id number]} ordered today.  Personally reviewed by me. This demonstrates ***.  Lab Results  Component Value Date   WBC 9.4 07/25/2019   HGB 12.2 08/08/2019   HCT 36.0 08/08/2019   PLT 263 07/25/2019   GLUCOSE 121 (H) 10/31/2019   CHOL 150 07/04/2019   TRIG 100 07/04/2019   HDL 68 07/04/2019   LDLCALC 64 07/04/2019   ALT 16 07/04/2019   AST 22 07/04/2019   NA 143 10/31/2019   K 4.3 10/31/2019   CL 104 10/31/2019   CREATININE 0.73 10/31/2019   BUN 13 10/31/2019   CO2 26 10/31/2019   TSH 1.76 12/15/2014   INR 1.1 (H) 10/06/2014   HGBA1C (H) 12/11/2007    6.2 (NOTE)   The ADA recommends the following therapeutic goal for glycemic   control related to Hgb A1C measurement:   Goal of Therapy:   < 7.0% Hgb A1C   Reference: American Diabetes Association: Clinical Practice   Recommendations 2008, Diabetes Care,  2008, 31:(Suppl 1).     BNP (last 3 results) No results for input(s): BNP in the last 8760 hours.  ProBNP (last 3 results) Recent Labs    07/25/19 0923  PROBNP 183     Other Studies Reviewed Today:   Assessment/Plan:  RIGHT/LEFT HEART CATH AND CORONARY ANGIOGRAPHY 08/08/2019  Conclusion   Moderate aortic stenosis with peak to  peak gradient of 33 mmHg, mean gradient 29 mmHg, and calculated aortic valve area of 0.81 cm.  Normal pulmonary pressures with mean arterial pressure of 23 mmHg.  3 mmHg gradient across the mitral valve  Normal left ventricular systolic function with EF 65%.  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 Luminal irregularities are noted throughout the LAD territory.  Luminal irregularities noted throughout the circumflex.  The RCA is dominant, tortuous, and contains a patent mid to distal stent beyond several acute bends. Diffuse luminal irregularities are noted otherwise.  Scattered significant  calcification is noted in the coronary arterial tree, the thoracic aorta, and mitral annulus.  RECOMMENDATIONS:   There is discordance between aortic stenosis and coronary disease severity and symptoms. Dyspnea on exertion may be multifactorial.  LAD aneurysm is stable.  Consider referral to aortic valve clinic for an opinion.  May need pulmonary evaluation.  May need transesophageal echo to exclude the possibility of mitral valve disease being more significant than suggested by current data.    ECHOCARDIOGRAM 12/2018: IMPRESSIONS  1. Left ventricular ejection fraction, by visual estimation, is 60 to  65%. The left ventricle has normal function. Normal left ventricular size.  There is moderately increased left ventricular hypertrophy.  2. Left ventricular diastolic Doppler parameters are consistent with  pseudonormalization pattern of LV diastolic filling.  3. Global right ventricle has normal systolic function.The right  ventricular size is normal. No increase in right ventricular wall  thickness.  4. Left atrial size was mildly dilated.  5. Right atrial size was normal.  6. Severe mitral annular calcification.  7. The mitral valve is degenerative. Mild mitral valve  regurgitation.  Mild mitral stenosis.  8. Mean mitral valve gradient 92mmHg.  9. The tricuspid valve is normal in structure. Tricuspid valve  regurgitation is mild.  10. Aortic valve mean gradient measures 24.5 mmHg.  11. Aortic valve peak gradient measures 41.2 mmHg.  12. The aortic valve is normal in structure. Aortic valve regurgitation  was not visualized by color flow Doppler. Moderate aortic valve stenosis.  13. There is Moderate calcification of the aortic valve.  14. There is Moderate thickening of the aortic valve.  15. The pulmonic valve was normal in structure. Pulmonic valve  regurgitation is not visualized by color flow Doppler.  16. Moderately elevated pulmonary artery systolic pressure.  17. The inferior vena cava is normal in size with greater than 50%  respiratory variability, suggesting right atrial pressure of 3 mmHg.    ASSESSMENT & PLAN:   1. Non rheumatic AS - chronic dyspnea - noted by Dr. Elsworth Soho from 01/2019 visit that he felt this was more related to her AS and not her asthma - we have reviewed her situation with Dr. Burt Knack here in the office today - there is concern that this is low flow low gradient AV - SV measurement is quite low from her prior echo. We are arranging TAVR consult with Dr. Burt Knack. Will also get her PFTs updated - ask her to touch base with Dr. Elsworth Soho following those studies.    2. CAD - no active chest pain - she is to continue with medical management.   3. HTN - BP is fine today on her current regimen. No changes made.   4. HLD - on PSCK9 therapy. LDL is 64.   5. DM - per PCP  6. Asthma   7. Lower extremity edema - needs salt restriction, compression stockings - work up for TAVR to start. For now would hold on diuretics.   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 Her last 3  echo studies are reviewed today:  2018                                        2019  2020 Mean Gradient           Mean Gradient                 Mean Gradient 28                                27                                      27    LVOT/AV                    LVOT/AV                          LVOT/AV 0.38                             0.35                                   0.32             The aortic valve is severely calcified and restricted. LV function is vigorous with significant LVH noted. The patient's most recent echo shows a stroke volume index of 0.16 (<35 suggestive of low flow). Her murmur and progressive symptoms are indicative of symptomatic severe AS. I have reviewed the natural history of aortic stenosis with the patient today. We have discussed the limitations of medical therapy and the poor prognosis associated with symptomatic aortic stenosis. We have reviewed potential treatment options, including palliative medical therapy, conventional surgical aortic valve replacement, and transcatheter aortic valve replacement. Considering her age of 6 years, TAVR would be a reasonable treatment option if her anatomy is conducive. We discussed treatment options in the context of the patient's specific comorbid medical conditions. I have reviewed her recent cath and echo studies. She understands that next steps in evaluation will include an updated echo (last Oct 2020) followed by CTA studies of the heart and the chest, abdomen, and pelvis. Once her studies are completed, she will be referred for formal surgical consultation as part of a multidisciplinary approach to her care. As part of her consultation today, I demonstrated a TAVR procedural animation and explained the details of the procedure. The potential complications and expected recovery are reviewed with her during this discussion.   Current medicines are reviewed with the patient today.  The patient  does not have concerns regarding medicines other than what has been noted above.  The following changes have been made:  See above.  Labs/ tests ordered today include:   No orders of the defined types were placed in this encounter.    Disposition:   FU with *** in {gen number [id number]} {Days to years:10300}.   Patient is agreeable to this plan and will call if any problems develop in the interim.   SignedTruitt Merle, NP  11/08/2019 4:45 PM  [facility] [address] [place], [place]  [address] Phone: [phone] Fax: [phone]

## 2019-11-09 ENCOUNTER — Ambulatory Visit: Payer: Medicare Other | Admitting: Nurse Practitioner

## 2019-11-09 NOTE — Telephone Encounter (Signed)
Left message on machine for pt to contact the office in regards to discussing TAVR testing results.

## 2019-11-10 NOTE — Telephone Encounter (Signed)
I spoke with the pt by phone and reviewed pre TAVR testing results.  I advised her that the TAVR team will review her case and Dr Roxy Manns will discuss recommendations further at her upcoming appointment 9/13.  Pt agreed with plan.

## 2019-12-01 ENCOUNTER — Telehealth: Payer: Self-pay | Admitting: Cardiovascular Disease

## 2019-12-01 NOTE — Telephone Encounter (Signed)
° ° ° °  Pt would like to ask Dr. Burt Knack if its safe for her to get the booster covid vaccine

## 2019-12-01 NOTE — Telephone Encounter (Signed)
Informed patient it is fine to get booster, but encouraged her not to have the shot within a week of any procedures in case she has an immune response. She was grateful for call and agrees with treatment plan.

## 2019-12-08 ENCOUNTER — Encounter: Payer: Self-pay | Admitting: Physician Assistant

## 2019-12-12 ENCOUNTER — Other Ambulatory Visit: Payer: Self-pay

## 2019-12-12 ENCOUNTER — Encounter: Payer: Self-pay | Admitting: Thoracic Surgery (Cardiothoracic Vascular Surgery)

## 2019-12-12 ENCOUNTER — Institutional Professional Consult (permissible substitution): Payer: Medicare Other | Admitting: Thoracic Surgery (Cardiothoracic Vascular Surgery)

## 2019-12-12 VITALS — BP 170/67 | HR 96 | Temp 97.6°F | Resp 20 | Ht 63.0 in | Wt 140.0 lb

## 2019-12-12 DIAGNOSIS — I350 Nonrheumatic aortic (valve) stenosis: Secondary | ICD-10-CM

## 2019-12-12 NOTE — H&P (View-Only) (Signed)
HEART AND [place] SURGERY CONSULTATION REPORT  Referring Provider is Belva Crome, MD Primary Cardiologist is Sinclair Grooms, MD PCP is [name], MD  Chief Complaint  Patient presents with  . 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  Patient states that she has known of presence of a heart murmur for many years.  In 2009 she suffered an acute non-ST segment elevation myocardial infarction for which she was treated with PCI and stenting of the right coronary artery.  She has been followed carefully ever since by Dr. [name].  She states that over the past 2 to 3 years she has developed progressive exertional shortness of breath and fatigue.  Echocardiograms have documented the presence of aortic stenosis that has gradually increased in severity.  Diagnostic cardiac catheterization was performed by Dr. [name] on Aug 08, 2019.  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  Catheterization also confirmed the presence of aortic stenosis with peak to peak and mean transvalvular gradients measured 33 and 29 mmHg respectively, corresponding to aortic valve area calculated only  0.81 cm.  Right-sided pressures were normal.  The patient was referred to the multidisciplinary heart valve clinic and has previously been seen in consultation by Dr. Burt Knack on September 21, 2019.  Follow-up transthoracic echocardiogram was performed October 07, 2019 revealing findings consistent with normal left ventricular systolic function but paradoxical severe low-flow low gradient aortic stenosis.  CT angiography was performed and the patient was referred for surgical consultation.  Patient is widowed and lives alone locally in [place].  She has been retired for several years having previously worked in the [employer] as a Marine scientist.  She has remained physically active and functionally independent throughout retirement.  However, she complains that over the last 2 to 3 years she has developed progressive exertional shortness of breath and fatigue.  She states that she now gets short of breath and tired quite easily, such as going up a single flight of stairs.  She denies resting shortness of breath, PND, orthopnea, dizzy spells, or syncope.  She has occasional palpitations and occasional lower extremity edema.  She now gets short of breath and tired with relatively low activity and this is limiting her lifestyle considerably.  Patient has received both shots for vaccination against COVID-19 vaccine and will be due her booster shot in approximately 1 month.  Past Medical History:  Diagnosis Date  . Arthritis   . Asthma   . CAD (coronary artery disease)    a. NSTEMI 2009 s/p DES to RCAx2.  . Carotid artery disease ([place])    a. Mild-moderate - followed by [facility].  . Chronic diarrhea   . Diabetes mellitus without complication ([place])   . Diverticulosis   . GERD (gastroesophageal reflux disease)   . HTN (hypertension)   . Hyperlipidemia   . Obesity   . Paroxysmal  ventricular tachycardia ([place])   . Rhinitis   . Rotator cuff tear   . Severe aortic stenosis     Past Surgical History:  Procedure  Laterality Date  . ANGIOPLASTY    . CORONARY ANGIOPLASTY WITH STENT PLACEMENT    . RIGHT/LEFT HEART CATH AND CORONARY ANGIOGRAPHY N/A 08/08/2019   Procedure: RIGHT/LEFT HEART CATH AND CORONARY ANGIOGRAPHY;  Surgeon: Belva Crome, MD;  Location: [facility];  Service: Cardiovascular;  Laterality: N/A;  . TONSILLECTOMY    . TOTAL ABDOMINAL HYSTERECTOMY    . TOTAL HIP ARTHROPLASTY      Family History  Problem Relation Age of Onset  . Allergies Mother   . Hyperlipidemia Mother   . Prostate cancer Maternal Grandfather   . Heart attack Father   . Lung disease Neg Hx     Social History   Socioeconomic History  . Marital status: Widowed    Spouse name: Not on file  . Number of children: Not on file  . Years of education: Not on file  . Highest education level: Not on file  Occupational History  . Occupation: retired Therapist, sports  Tobacco Use  . Smoking status: Former Smoker    Packs/day: 1.00    Years: 30.00    Pack years: 30.00    Types: Cigarettes    Quit date: 03/31/1998    Years since quitting: 21.7  . Smokeless tobacco: Never Used  . Tobacco comment: Quit intermittently since her 37s.  Vaping Use  . Vaping Use: Never used  Substance and Sexual Activity  . Alcohol use: Yes    Alcohol/week: 0.0 standard drinks    Comment: Rare.  . Drug use: No  . Sexual activity: Not on file  Other Topics Concern  . Not on file  Social History Narrative   Originally from [place]. She has traveled to [place], [place], [place], & [place]. Previously worked as a Marine scientist. No known TB exposure. No pets currently. No bird, hot tube, or asbestos exposure. Enjoys playing bridge.    Social Determinants of Health   Financial Resource Strain:   . Difficulty of Paying Living Expenses: Not on file  Food Insecurity:   . Worried About Charity fundraiser in the Last Year: Not on file  . Ran Out of Food in the Last Year: Not on file  Transportation Needs:   . Lack of Transportation (Medical): Not on file  . Lack of  Transportation (Non-Medical): Not on file  Physical Activity:   . Days of Exercise per Week: Not on file  . Minutes of Exercise per Session: Not on file  Stress:   . Feeling of Stress : Not on file  Social Connections:   . Frequency of Communication with Friends and Family: Not on file  . Frequency of Social Gatherings with Friends and Family: Not on file  . Attends Religious Services: Not on file  . Active Member of Clubs or Organizations: Not on file  . Attends Archivist Meetings: Not on file  . Marital Status: Not on file  Intimate Partner Violence:   . Fear of Current or Ex-Partner: Not on file  . Emotionally Abused: Not on file  . Physically Abused: Not on file  . Sexually Abused: Not on file    Current Outpatient Medications  Medication Sig Dispense Refill  . albuterol (VENTOLIN HFA) 108 (90 Base) MCG/ACT inhaler Inhale 2 puffs into the lungs every 6 (six) hours as needed for wheezing or shortness of breath.  8 g 3  . aspirin EC 81 MG tablet Take 162 mg by mouth daily.     . B Complex-C (B-COMPLEX WITH VITAMIN C) tablet Take 1 tablet by mouth 2 (two) times a week.    . Budesonide ER 9 MG TB24 Take by mouth daily as needed.    . budesonide-formoterol (SYMBICORT) 80-4.5 MCG/ACT inhaler Inhale 2 puffs into the lungs 2 (two) times daily. 1 Inhaler 11  . BYSTOLIC 5 MG tablet TAKE ONE TABLET BY MOUTH DAILY 90 tablet 3  . calcium carbonate (TUMS - DOSED IN MG ELEMENTAL CALCIUM) 500 MG chewable tablet Chew 1 tablet by mouth 3 (three) times daily as needed. For heartburn.    . clindamycin (CLEOCIN) 300 MG capsule Take 2 capsules (600 mg) 1 hour prior to dental visits. 6 capsule 6  . Evolocumab (REPATHA SURECLICK) 409 MG/ML SOAJ Inject 1 pen into the skin every 14 (fourteen) days. 2 pen 11  . fexofenadine (ALLEGRA) 180 MG tablet Take 180 mg by mouth daily.      . Glucose Blood (FREESTYLE LITE TEST VI) Once daily    . metFORMIN (GLUCOPHAGE-XR) 500 MG 24 hr tablet Take 500 mg by  mouth daily.    . Multiple Vitamins-Minerals (MULTIVITAMIN WITH MINERALS) tablet Take 1 tablet by mouth daily.      . nitroGLYCERIN (NITROSTAT) 0.4 MG SL tablet Place 1 tablet (0.4 mg total) under the tongue every 5 (five) minutes as needed for chest pain. 25 tablet 3  . Omega-3 1000 MG CAPS Take 1,000 mg by mouth daily.    Marland Kitchen OVER THE COUNTER MEDICATION Take 1 capsule by mouth daily. Circulation and vein    . Respiratory Therapy Supplies (FLUTTER) DEVI Use flutter valve 1 times daily as needed    . Turmeric 400 MG CAPS Take 400 mg by mouth daily.     No current facility-administered medications for this visit.    Allergies  Allergen Reactions  . Cefuroxime Axetil Hives and Itching  . Cephalexin Other (See Comments)    Pt. Doesn't remember reaction Total body pain in joints, couldn't swallow   . Codeine     Tolerates Dilaudid.  . Doxycycline     REACTION: vomiting/ GI upset  . Erythromycin Base Other (See Comments)    unknown      Review of Systems:   General:  normal appetite, decreased energy, no weight gain, no weight loss, no fever  Cardiac:  + chest pain with exertion, no chest pain at rest, +SOB with exertion, no resting SOB, no PND, no orthopnea, + palpitations, no arrhythmia, no atrial fibrillation, occasional LE edema, no dizzy spells, no syncope  Respiratory:  + exertional shortness of breath, no home oxygen, + intermittent chronic productive cough, no dry cough, + chronic bronchitis, no wheezing, no hemoptysis, no asthma, no pain with inspiration or cough, no sleep apnea, no CPAP at night  GI:   no difficulty swallowing, no reflux, no frequent heartburn, + hiatal hernia, no abdominal pain, + constipation, + diarrhea, no hematochezia, no hematemesis, no melena  GU:   no dysuria,  no frequency, no urinary tract infection, no hematuria, no kidney stones, no kidney disease  Vascular:  no pain suggestive of claudication, no pain in feet, no leg cramps, + varicose veins, no  DVT, no non-healing foot ulcer  Neuro:   no stroke, no TIA's, no seizures, no headaches, + occular migraines, no temporary blindness one eye,  no slurred speech, + peripheral neuropathy, no chronic pain,  no instability of gait, no memory/cognitive dysfunction  Musculoskeletal: + arthritis particularly both knees, + joint swelling, no myalgias, no difficulty walking, normal mobility   Skin:   no rash, no itching, no skin infections, no pressure sores or ulcerations  Psych:   no anxiety, no depression, no nervousness, no unusual recent stress  Eyes:   no blurry vision, occasional floaters, no recent vision changes, does not wear glasses or contacts  ENT:   no hearing loss, no loose or painful teeth, no dentures, last saw dentist 12/06/2019  Hematologic:  no easy bruising, no abnormal bleeding, no clotting disorder, no frequent epistaxis  Endocrine:  + diabetes, does check CBG's at home           Physical Exam:   BP (!) 170/67   Pulse 96   Temp 97.6 F (36.4 C) (Skin)   Resp 20   Ht 5\' 3"  (1.6 m)   Wt 140 lb (63.5 kg)   SpO2 98% Comment: RA  BMI 24.80 kg/m   General:    well-appearing  HEENT:  Unremarkable   Neck:   no JVD, no bruits, no adenopathy   Chest:   clear to auscultation, symmetrical breath sounds, no wheezes, no rhonchi   CV:   RRR, grade IV/VI crescendo/decrescendo murmur heard best at RSB,  no diastolic murmur  Abdomen:  soft, non-tender, no masses   Extremities:  warm, well-perfused, pulses diminished but palpable, no LE edema  Rectal/GU  Deferred  Neuro:   Grossly non-focal and symmetrical throughout  Skin:   Clean and dry, no rashes, no breakdown   Diagnostic Tests:  EKG: NSR w/out significant AV conduction delay (08/08/2019)     RIGHT/LEFT HEART CATH AND CORONARY ANGIOGRAPHY  Conclusion   Moderate aortic stenosis with peak to peak gradient of 33 mmHg, mean gradient 29 mmHg, and calculated aortic valve area of 0.81 cm.  Normal pulmonary pressures with  mean arterial pressure of 23 mmHg.  3 mmHg gradient across the mitral valve  Normal left ventricular systolic function with EF 65%.  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  Luminal irregularities are noted throughout the LAD territory.  Luminal irregularities noted throughout the circumflex.  The RCA is dominant, tortuous, and contains a patent mid to distal stent beyond several acute bends.  Diffuse luminal irregularities are noted otherwise.  Scattered significant calcification is noted in the coronary arterial tree, the thoracic aorta, and mitral annulus.  RECOMMENDATIONS:   There is discordance between aortic stenosis and coronary disease severity and symptoms.  Dyspnea on exertion may be multifactorial.  LAD aneurysm is stable.  Consider referral to aortic valve clinic for an opinion.  May need pulmonary evaluation.  May need transesophageal echo to exclude the possibility of mitral valve disease being more significant than suggested by current data. Recommendations  Antiplatelet/Anticoag Recommend Aspirin 81mg  daily for moderate CAD.  Surgeon Notes    08/08/2019 11:08 AM CV Procedure signed by Belva Crome, MD  Indications  Aortic stenosis, moderate [I35.0 (ICD-10-CM)]  CAD in native artery [I25.10 (ICD-10-CM)]  Nonrheumatic mitral valve stenosis with insufficiency [I34.2, I34.0 (ICD-10-CM)]  Procedural Details  Technical Details The right radial area was sterilely prepped and draped. Intravenous sedation with Versed and fentanyl was administered. 1% Xylocaine was infiltrated to achieve local analgesia. Using real-time vascular ultrasound, a double wall stick with an angiocath was utilized to obtain intra-arterial access. A VUS image was saved  for the permanent record.The modified Seldinger technique was used to place a 20F " Slender" sheath in the right radial artery.  Weight based heparin was administered. Coronary angiography was done using 5 F catheters. Right coronary angiography was performed with a JR4. Transvalvular pressure and Left ventricular hemodymic recordings and angiography was done using the JR 4 catheter and hand injection. The valve was crossed with the assistance of a straight wire. Left coronary angiography was performed with a JL 3.5 cm.  Right heart catheterization was performed by exchanging a previously placed antecubital IV angio-cath for a 5 French Slender sheath. 1% Xylocaine was used to locally nesthetize the area around the IV site. The IV catheter was wired using an .018 guidewire. The modified Seldinger technique was used to place the 5 [place] sheath. Double glove technique was used to enhance sterility. After sheath insertion, right heart cath was performed using a 5 French balloon tipped catheter and fluoroscopic guidance. Pressures were recorded in each chamber and in the pulmonary capillary wedge position.. The main pulmonary artery O2 saturation was sampled.   Hemostasis was achieved using a pneumatic band.  During this procedure the patient is administered a total of Versed 2 mg and Fentanyl 25 mg to achieve and maintain moderate conscious sedation.  The patient's heart rate, blood pressure, and oxygen saturation are monitored continuously during the procedure. The period of conscious sedation is 37 minutes, of which I was present face-to-face 100% of this time. Estimated blood loss <50 mL.   During this procedure medications were administered to achieve and maintain moderate conscious sedation while the patient's heart rate, blood pressure, and oxygen saturation were continuously monitored and I was present face-to-face 100% of this time.  Medications (Filter: Administrations occurring from 0902 to 1050 on 08/08/19) Heparin (Porcine) in NaCl 1000-0.9 UT/500ML-% SOLN (mL) Total volume:  1,000 mL Date/Time  Rate/Dose/Volume Action    08/08/19 0912  500 mL Given  0913  500 mL Given    fentaNYL (SUBLIMAZE) injection (mcg) Total dose:  50 mcg Date/Time  Rate/Dose/Volume Action  08/08/19 0946  25 mcg Given  1006  25 mcg Given    midazolam (VERSED) injection (mg) Total dose:  2 mg Date/Time  Rate/Dose/Volume Action  08/08/19 0946  1 mg Given  0958  1 mg Given    lidocaine (PF) (XYLOCAINE) 1 % injection (mL) Total volume:  4 mL Date/Time  Rate/Dose/Volume Action  08/08/19 0957  2 mL Given  1000  2 mL Given    Radial Cocktail/Verapamil only (mL) Total volume:  10 mL Date/Time  Rate/Dose/Volume Action  08/08/19 1008  10 mL Given    heparin sodium (porcine) injection (Units) Total dose:  3,000 Units Date/Time  Rate/Dose/Volume Action  08/08/19 1022  3,000 Units Given    iohexol (OMNIPAQUE) 350 MG/ML injection (mL) Total volume:  80 mL Date/Time  Rate/Dose/Volume Action  08/08/19 1042  80 mL Given    Sedation Time  Sedation Time Physician-1: 54 minutes 52 seconds  Contrast  Medication Name Total Dose  iohexol (OMNIPAQUE) 350 MG/ML injection 80 mL    Radiation/Fluoro  Fluoro time: 8.7 (min) DAP: 13.1 (Gycm2) Cumulative Air Kerma: 266.9 (mGy)  Coronary Findings  Diagnostic Dominance: Right Left Anterior Descending  The vessel exhibits minimal luminal irregularities.  Non-stenotic Ost LAD lesion.  Prox LAD to Mid LAD lesion is 40% stenosed.  Left Circumflex  There is mild diffuse disease throughout the vessel.  First Obtuse Marginal Branch  Right Coronary Artery  .  Intervention  No interventions have been documented. Right Heart  Right Heart Pressures Hemodynamic findings consistent with pulmonary hypertension. LV EDP is normal.  Right Atrium The right atrial size is normal.  Wall Motion  Resting               Left Heart  Left Ventricle The left ventricular size is normal. The left ventricular systolic function is normal. LV end diastolic pressure is normal. The left  ventricular ejection fraction is 55-65% by visual estimate. No regional wall motion abnormalities. There is no evidence of mitral regurgitation.  Coronary Diagrams  Diagnostic Dominance: Right  Intervention  Implants   No implant documentation for this case.  Syngo Images  Show images for CARDIAC CATHETERIZATION Images on Long Term Storage  Show images for [place], Vasilisa A "[name]" Link to Procedure Log  Procedure Log    Hemo Data   Most Recent Value  Fick Cardiac Output 5.23 L/min  Fick Cardiac Output Index 3.13 (L/min)/BSA  Aortic Mean Gradient 28.6 mmHg  Aortic Peak Gradient 29 mmHg  Aortic Valve Area 0.81  Aortic Value Area Index 0.48 cm2/BSA  RA A Wave 7 mmHg  RA V Wave 5 mmHg  RA Mean 5 mmHg  RV Systolic Pressure 36 mmHg  RV Diastolic Pressure 1 mmHg  RV EDP 6 mmHg  PA Systolic Pressure 30 mmHg  PA Diastolic Pressure 11 mmHg  PA Mean 23 mmHg  PW A Wave 18 mmHg  PW V Wave 26 mmHg  PW Mean 16 mmHg  AO Systolic Pressure 416 mmHg  AO Diastolic Pressure 60 mmHg  AO Mean 91 mmHg  LV Systolic Pressure 606 mmHg  LV Diastolic Pressure 4 mmHg  LV EDP 13 mmHg  AOp Systolic Pressure 301 mmHg  AOp Diastolic Pressure 65 mmHg  AOp Mean Pressure 601 mmHg  LVp Systolic Pressure 093 mmHg  LVp Diastolic Pressure 8 mmHg  LVp EDP Pressure 18 mmHg  QP/QS 1  TPVR Index 7.34 HRUI  TSVR Index 29.01 HRUI  PVR SVR Ratio 0.08  TPVR/TSVR Ratio 0.25      ECHOCARDIOGRAM REPORT       Patient Name:  KARINDA CABRIALES Junod Date of Exam: 10/07/2019  Medical Rec #: [id number]     Height:    63.0 in  Accession #:  [id number]    Weight:    146.0 lb  Date of Birth: 1940/08/16     BSA:     1.692 m  Patient Age:  61 years     BP:      122/64 mmHg  Patient Gender: F         HR:      84 bpm.  Exam Location: [address]   Procedure: 2D Echo, 3D Echo, Cardiac Doppler, Color Doppler and Strain  Analysis   Indications:  I35  Aortic stenosis    History:    Patient has prior history of Echocardiogram examinations,  most         recent 01/20/2019. Aortic Valve Disease,         Arrythmias:Tachycardia., Signs/Symptoms:Dyspnea; Risk         Factors:Hypertension, Diabetes, Dyslipidemia and Obesity.    Sonographer:  Jessee Avers, RDCS  Referring Phys: [place]    1. Left ventricular ejection fraction, by estimation, is 60 to 65%. The  left ventricle has normal function. The left ventricle has no regional  wall motion abnormalities. There is mild left ventricular hypertrophy.  Left ventricular diastolic function  could not be evaluated. The average left ventricular global longitudinal  strain is -21.8 %. The global longitudinal strain is normal.  2. Right ventricular systolic function is normal. The right ventricular  size is normal. There is mildly elevated pulmonary artery systolic  pressure.  3. The mitral valve is abnormal. Mild mitral valve regurgitation. Mild  mitral stenosis. The mean mitral valve gradient is 8.0 mmHg with average  heart rate of 84 bpm.  4. The aortic valve is tricuspid. Aortic valve regurgitation is trivial.  Severe aortic valve stenosis. Aortic valve area, by VTI measures 0.83 cm.  Aortic valve mean gradient measures 31.0 mmHg. Aortic valve Vmax measures  3.54 m/s.  5. The inferior vena cava is normal in size with greater than 50%  respiratory variability, suggesting right atrial pressure of 3 mmHg.   Comparison(s): 01/20/19 EF 60-65%. Moderate AS 75mmHg mean PG, 67mmHg peak  PG.   FINDINGS  Left Ventricle: Left ventricular ejection fraction, by estimation, is 60  to 65%. The left ventricle has normal function. The left ventricle has no  regional wall motion abnormalities. The average left ventricular global  longitudinal strain is -21.8 %.  The global longitudinal strain is normal. The left ventricular internal   cavity size was normal in size. There is mild left ventricular  hypertrophy. Left ventricular diastolic function could not be evaluated  due to mitral annular calcification (moderate  or greater). Left ventricular diastolic function could not be evaluated.   Right Ventricle: The right ventricular size is normal. No increase in  right ventricular wall thickness. Right ventricular systolic function is  normal. There is mildly elevated pulmonary artery systolic pressure. The  tricuspid regurgitant velocity is 3.08  m/s, and with an assumed right atrial pressure of 3 mmHg, the estimated  right ventricular systolic pressure is 28.4 mmHg.   Left Atrium: Left atrial size was normal in size.   Right Atrium: Right atrial size was normal in size.   Pericardium: There is no evidence of pericardial effusion.   Mitral Valve: The mitral valve is abnormal. There is mild calcification of  the posterior and anterior mitral valve leaflet(s). Mild to moderate  mitral annular calcification. Mild mitral valve regurgitation. Mild mitral  valve stenosis. MV peak gradient,  20.4 mmHg. The mean mitral valve gradient is 8.0 mmHg with average heart  rate of 84 bpm.   Tricuspid Valve: The tricuspid valve is grossly normal. Tricuspid valve  regurgitation is trivial.   Aortic Valve: The aortic valve is tricuspid. . There is moderate  thickening and severe calcifcation of the aortic valve. Aortic valve  regurgitation is trivial. Aortic regurgitation PHT measures 235 msec.  Severe aortic stenosis is present. There is  moderate thickening of the aortic valve. There is severe calcifcation of  the aortic valve. Aortic valve mean gradient measures 31.0 mmHg. Aortic  valve peak gradient measures 50.1 mmHg. Aortic valve area, by VTI measures  0.83 cm.   Pulmonic Valve: The pulmonic valve was grossly normal. Pulmonic valve  regurgitation is trivial.   Aorta: The aortic root and ascending aorta are structurally  normal, with  no evidence of dilitation.   Venous: The inferior vena cava is normal in size with greater than 50%  respiratory variability, suggesting right atrial pressure of 3 mmHg.   IAS/Shunts: No atrial level shunt detected by color flow Doppler.     LEFT VENTRICLE  PLAX 2D  LVIDd:     3.80 cm Diastology  LVIDs:     2.50  cm LV e' lateral:  5.78 cm/s  LV PW:     0.70 cm LV E/e' lateral: 34.1  LV IVS:    1.20 cm LV e' medial:  7.05 cm/s  LVOT diam:   1.90 cm LV E/e' medial: 27.9  LV SV:     73  LV SV Index:  43    2D Longitudinal Strain  LVOT Area:   2.84 cm 2D Strain GLS (A2C):  -21.9 %             2D Strain GLS (A3C):  -22.1 %             2D Strain GLS (A4C):  -21.5 %             2D Strain GLS Avg:   -21.8 %               3D Volume EF:             3D EF:    61 %             LV EDV:    94 ml             LV ESV:    37 ml             LV SV:    57 ml   RIGHT VENTRICLE  RV Basal diam: 3.30 cm  RV S prime:   10.70 cm/s  TAPSE (M-mode): 2.2 cm  RVSP:      45.9 mmHg   LEFT ATRIUM       Index    RIGHT ATRIUM      Index  LA diam:    3.80 cm 2.25 cm/m RA Pressure: 8.00 mmHg  LA Vol (A2C):  34.5 ml 20.40 ml/m RA Area:   9.98 cm  LA Vol (A4C):  39.1 ml 23.11 ml/m RA Volume:  17.30 ml 10.23 ml/m  LA Biplane Vol: 38.9 ml 23.00 ml/m  AORTIC VALVE  AV Area (Vmax):  0.89 cm  AV Area (Vmean):  0.86 cm  AV Area (VTI):   0.83 cm  AV Vmax:      354.00 cm/s  AV Vmean:     252.250 cm/s  AV VTI:      0.872 m  AV Peak Grad:   50.1 mmHg  AV Mean Grad:   31.0 mmHg  LVOT Vmax:     111.00 cm/s  LVOT Vmean:    76.100 cm/s  LVOT VTI:     0.256 m  LVOT/AV VTI ratio: 0.29  AI PHT:      235 msec    AORTA  Ao Root diam: 3.10 cm  Ao Asc diam:  3.00 cm   MITRAL VALVE        TRICUSPID VALVE               TR Peak grad:  37.9 mmHg               TR Vmax:    308.00 cm/s  MV Mean grad: 8.0 mmHg   Estimated RAP: 8.00 mmHg  MV Vmax:    2.26 m/s   RVSP:      45.9 mmHg  MV Vmean:   125.0 cm/s  MV Decel Time: 162 msec   SHUNTS  MV E velocity: 197.00 cm/s Systemic VTI: 0.26 m  MV A velocity: 108.00 cm/s Systemic Diam: 1.90 cm  MV E/A ratio: 1.82   Lyman Bishop MD  Electronically signed by Chrissie Noa  Hilty MD  Signature Date/Time: 10/07/2019/10:34:36 AM     Cardiac TAVR CT  TECHNIQUE: The patient was scanned on a Siemens Force 382 slice scanner. A 120 kV retrospective scan was triggered in the descending thoracic aorta at 111 HU's. Gantry rotation speed was 270 msecs and collimation was .9 mm. No beta blockade or nitro were given. The 3D data set was reconstructed in 5% intervals of the R-R cycle. Systolic and diastolic phases were analyzed on a dedicated work station using MPR, MIP and VRT modes. The patient received 193mL OMNIPAQUE IOHEXOL 350 MG/ML SOLN of contrast.  FINDINGS: Aortic Valve: Tricuspid aortic valve. Severely reduced cusp separation. Severely thickened, moderately calcified aortic valve cusps.  AV calcium score: 839  Virtual Basal Annulus Measurements:  Maximum/Minimum Diameter: 21.9 x 19.3 mm  Perimeter: 64.4 mm  Area: 321 mm2  LVOT calcifications extending to anterior mitral leaflet.  Based on these measurements, the annulus measures within range for a 20 mm Edwards Sapien 3 valve. Sizing is borderline for 23 mm vs 26 mm Medtronic Evolut Pro supra-annular valve.  Sinus of Valsalva Measurements:  Non-coronary:  26 mm  Right - coronary:  26 mm  Left - coronary:  27 mm  Sinus of Valsalva Height:  Left: 16.2 mm  Right: 17.6 mm  Aorta: Severe mixed atherosclerotic plaque.  Sinotubular Junction:  25  mm  Ascending Thoracic Aorta:   mm  Aortic Arch:   mm  Descending Thoracic Aorta:   mm  Coronary Artery Height above Annulus:  Left Main: 11.1 mm  Right Coronary: 15.2 mm  Coronary Arteries: 3 vessel coronary artery disease.  Optimum Fluoroscopic Angle for Delivery: LAO 1 CAU 1  No left atrial appendage thrombus.  Moderate mitral annular calcification.  IMPRESSION: 1. Aortic Valve: Tricuspid aortic valve. Severely reduced cusp separation. Severely thickened, moderately calcified aortic valve cusps.  2.  AV calcium score: 839  3.  LVOT calcifications extending to anterior mitral leaflet.  4. Borderline height of the left main coronary artery ostia, 11 mm. Adequate height of the RCA ostium.  5. Based on these findings, the annulus measures within range for a 20 mm Edwards Sapien 3 valve. Sizing is borderline for 23 mm vs 26 mm Medtronic Evolut Pro supra-annular valve. Recommend Structural Heart Team discussion for valve selection.  6. Optimum Fluoroscopic Angle for Delivery: LAO 1 CAU 1   Electronically Signed   By: Cherlynn Kaiser   On: 11/09/2019 07:58    CT ANGIOGRAPHY CHEST, ABDOMEN AND PELVIS  TECHNIQUE: Multidetector CT imaging through the chest, abdomen and pelvis was performed using the standard protocol during bolus administration of intravenous contrast. Multiplanar reconstructed images and MIPs were obtained and reviewed to evaluate the vascular anatomy.  CONTRAST:  166mL OMNIPAQUE IOHEXOL 350 MG/ML SOLN  COMPARISON:  10/27/2014 chest CT angiogram.  FINDINGS: CTA CHEST FINDINGS  Cardiovascular: Borderline mild cardiomegaly. No significant pericardial effusion/thickening. Diffusely thickened and coarsely calcified aortic valve. Three-vessel coronary atherosclerosis. Atherosclerotic nonaneurysmal thoracic aorta. Normal caliber pulmonary arteries. No central pulmonary emboli.  Mediastinum/Nodes: Subcentimeter  hypodense bilateral thyroid nodules, unchanged. Not clinically significant; no follow-up imaging recommended (ref: J Am Coll Radiol. 2015 Feb;12(2): 143-50). Unremarkable esophagus. No pathologically enlarged axillary, mediastinal or hilar lymph nodes.  Lungs/Pleura: No pneumothorax. No pleural effusion. Tiny 3 mm perifissural nodule in the right middle lobe along the minor fissure (series 5/image 40) is stable since 2016 CT and considered benign. No acute consolidative airspace disease, lung masses or new significant pulmonary nodules.  Musculoskeletal:  No aggressive appearing focal osseous lesions. Moderate thoracic spondylosis.  CTA ABDOMEN AND PELVIS FINDINGS  Hepatobiliary: Normal liver with no liver mass. Normal gallbladder with no radiopaque cholelithiasis. No biliary ductal dilatation.  Pancreas: Normal, with no mass or duct dilation.  Spleen: Normal size. No mass.  Adrenals/Urinary Tract: Right adrenal 1.7 cm nodule with density 125 HU, stable since 10/27/2014 CT, compatible with a benign adenoma. No hydronephrosis. Scattered subcentimeter hypodense renal cortical lesions in both kidneys are too small to characterize and require no follow-up. No suspicious contour deforming renal masses. Bladder obscured by streak artifact from right hip hardware. No gross bladder abnormality.  Stomach/Bowel: Small hiatal hernia. Otherwise normal nondistended stomach. Normal caliber small bowel with no small bowel wall thickening. Normal appendix. Mild sigmoid diverticulosis with no large bowel wall thickening or significant pericolonic fat stranding.  Vascular/Lymphatic: Atherosclerotic nonaneurysmal abdominal aorta. Patent splenic and renal veins. No pathologically enlarged lymph nodes in the abdomen or pelvis.  Reproductive: Status post hysterectomy, with no abnormal findings at the vaginal cuff. No adnexal mass.  Other: No pneumoperitoneum, ascites or focal fluid  collection.  Musculoskeletal: No aggressive appearing focal osseous lesions. Marked lumbar spondylosis. Right total hip arthroplasty.  VASCULAR MEASUREMENTS PERTINENT TO TAVR:  AORTA:  Minimal Aortic Diameter-10.0 x 9.7 mm  Severity of Aortic Calcification-moderate to severe  RIGHT PELVIS:  Right Common Iliac Artery -  Minimal Diameter-7.1 x 5.7 mm  Tortuosity-moderate  Calcification-moderate  Right External Iliac Artery -  Minimal Diameter-4.9 x 4.4 mm  Tortuosity-mild  Calcification-none  Right Common Femoral Artery -  Minimal Diameter-6.4 x 6.2 mm  Tortuosity-mild  Calcification-none  LEFT PELVIS:  Left Common Iliac Artery -  Minimal Diameter-7.6 x 7.5 mm  Tortuosity-mild  Calcification-mild  Left External Iliac Artery -  Minimal Diameter-5.3 x 5.2 mm  Tortuosity-mild  Calcification-none  There is a beaded appearance to the left external iliac artery.  Left Common Femoral Artery -  Minimal Diameter-6.3 x 6.1 mm  Tortuosity-mild  Calcification-none  Review of the MIP images confirms the above findings.  IMPRESSION: 1. Vascular findings and measurements pertinent to potential TAVR procedure, as detailed. 2. Diffusely thickened and calcified aortic valve, compatible with reported history of aortic valve stenosis. 3. Three-vessel coronary atherosclerosis. 4. Borderline mild cardiomegaly. 5. Small hiatal hernia. 6. Stable right adrenal adenoma. 7. Mild sigmoid diverticulosis. 8. Aortic Atherosclerosis (ICD10-I70.0).   Electronically Signed   By: Ilona Sorrel M.D.   On: 11/03/2019 14:21    Impression:  Patient has stage D3 severe paradoxical low flow, low gradient aortic stenosis.  She describes progressive symptoms of exertional shortness of breath and fatigue consistent with chronic diastolic congestive heart failure, [place] Heart Association functional class III.  I have personally  reviewed the patient's most recent transthoracic echocardiogram, diagnostic cardiac catheterization, and CT angiograms.  Echocardiogram reveals trileaflet aortic valve with severe thickening, calcification, and restricted leaflet mobility involving all 3 leaflets of the aortic valve.  The aortic annulus is also relatively small diameter.  Peak velocity across the aortic valve measured 3.5 m/s corresponding to mean transvalvular gradient estimated 31 mmHg but aortic valve area calculated only 0.83 cm by VTI.  The DVI was measured 0.29 and stroke-volume index 43.  Diagnostic cardiac catheterization confirmed the presence of severe aortic stenosis with mean transvalvular gradient measured 29 mmHg but aortic valve area calculated only 0.81 cm.  Right heart pressures were normal.  Catheterization was also notable for the absence of significant coronary artery disease.  Cardiac-gated CTA of  the heart reveals anatomical characteristics consistent with aortic stenosis suitable for treatment by transcatheter aortic valve replacement without any significant complicating features although there is some calcification in the aortic annulus which extends into the left ventricular outflow tract which might slightly increase risk of paravalvular leak.  CTA of the aorta and iliac vessels demonstrate what appears to be adequate pelvic vascular access to facilitate a transfemoral approach.  Baseline EKG reveals sinus rhythm without significant AV conduction delay.  I agree the patient would benefit from aortic valve replacement.  Risks associated with conventional surgery would be slightly elevated because of the patient's age and more importantly by the presence of relatively small aortic root which would likely require root enlargement or root replacement to avoid patient prosthesis mismatch.  The patient also has significant calcification near the sinotubular junction.  Under the circumstances I favor transcatheter aortic valve  replacement.     Plan:  The patient counseled at length regarding treatment alternatives for management of severe symptomatic aortic stenosis. Alternative approaches such as conventional aortic valve replacement, transcatheter aortic valve replacement, and continued medical therapy without intervention were compared and contrasted at length.  The risks associated with conventional surgical aortic valve replacement were discussed in detail, as were expectations for post-operative convalescence, alternative surgical approaches, prosthetic valve choices, and the presence or absence of other concomitant conditions which might require intervention.  Issues specific to transcatheter aortic valve replacement were discussed including questions about long term valve durability, the potential for paravalvular leak, possible increased risk of need for permanent pacemaker placement, the suitability of the patient's surgical anatomy, and other potential technical complications related to the procedure itself.  Long-term prognosis without surgical intervention was discussed.  All treatment options were discussed in the context of the patient's own specific clinical presentation, past medical history, long-term prognosis and life expectancy.  All of her questions have been addressed.  The patient desires to proceed with transcatheter aortic valve replacement in the near future.  We tentatively plan to proceed with surgery on December 20, 2019.  Following the decision to proceed with transcatheter aortic valve replacement, a discussion has been held regarding what types of management strategies would be attempted intraoperatively in the event of life-threatening complications, including whether or not the patient would be considered a candidate for the use of cardiopulmonary bypass and/or conversion to open sternotomy for attempted surgical intervention.  The patient specifically requests that should a potentially  life-threatening complication develop we would attempt emergency median sternotomy and/or other aggressive surgical procedures.  The patient has been advised of a variety of complications that might develop including but not limited to risks of death, stroke, paravalvular leak, aortic dissection or other major vascular complications, aortic annulus rupture, device embolization, cardiac rupture or perforation, mitral regurgitation, acute myocardial infarction, arrhythmia, heart block or bradycardia requiring permanent pacemaker placement, congestive heart failure, respiratory failure, renal failure, pneumonia, infection, other late complications related to structural valve deterioration or migration, or other complications that might ultimately cause a temporary or permanent loss of functional independence or other long term morbidity.  The patient provides full informed consent for the procedure as described and all questions were answered.    I spent in excess of 90 minutes during the conduct of this office consultation and >50% of this time involved direct face-to-face encounter with the patient for counseling and/or coordination of their care.     Valentina Gu. Roxy Manns, MD 12/12/2019 9:42 AM

## 2019-12-12 NOTE — Patient Instructions (Signed)
Stop taking metformin 2 days prior surgery  Continue taking all other current medications without change through the day before surgery.  Make sure to bring all of your medications with you when you come for your Pre-Admission Testing appointment at [facility] Short-Stay Department.  Have nothing to eat or drink after midnight the night before surgery.  On the morning of surgery do not take any medications.  At your appointment for Pre-Admission Testing at the [facility] Short-Stay Department you will be asked to sign permission forms for your upcoming surgery.  By definition your signature on these forms implies that you and/or your designee provide full informed consent for your planned surgical procedure(s), that alternative treatment options have been discussed, that you understand and accept any and all potential risks, and that you have some understanding of what to expect for your post-operative convalescence.  For any major cardiac surgical procedure potential operative risks include but are not limited to at least some risk of death, stroke or other neurologic complication, myocardial infarction, congestive heart failure, respiratory failure, renal failure, bleeding requiring blood transfusion and/or reexploration, irregular heart rhythm, heart block or bradycardia requiring permanent pacemaker, pneumonia, pericardial effusion, pleural effusion, wound infection, pulmonary embolus or other thromboembolic complication, chronic pain, or other complications related to the specific procedure(s) performed.  For transcatheter aortic valve replacement additional risks include but are not limited to risk of paravalvular leak, valve embolization, valve thrombosis, aortic dissection, aortic rupture, ventricular septal defect or perforation, pericardial tamponade, injury of the abdominal aorta or its branches, and/or injury or occlusion of the arteries going to your arms  or legs.  Please call to schedule a follow-up appointment in our office prior to surgery if you have any unresolved questions about your planned surgical procedure, the associated risks, alternative treatment options, and/or expectations for your post-operative recovery.

## 2019-12-12 NOTE — Progress Notes (Signed)
HEART AND [place] SURGERY CONSULTATION REPORT  Referring Provider is Belva Crome, MD Primary Cardiologist is Sinclair Grooms, MD PCP is [name], MD  Chief Complaint  Patient presents with  . 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  Patient states that she has known of presence of a heart murmur for many years.  In 2009 she suffered an acute non-ST segment elevation myocardial infarction for which she was treated with PCI and stenting of the right coronary artery.  She has been followed carefully ever since by Dr. [name].  She states that over the past 2 to 3 years she has developed progressive exertional shortness of breath and fatigue.  Echocardiograms have documented the presence of aortic stenosis that has gradually increased in severity.  Diagnostic cardiac catheterization was performed by Dr. [name] on Aug 08, 2019.  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  Catheterization also confirmed the presence of aortic stenosis with peak to peak and mean transvalvular gradients measured 33 and 29 mmHg respectively, corresponding to aortic valve area calculated only  0.81 cm.  Right-sided pressures were normal.  The patient was referred to the multidisciplinary heart valve clinic and has previously been seen in consultation by Dr. Burt Knack on September 21, 2019.  Follow-up transthoracic echocardiogram was performed October 07, 2019 revealing findings consistent with normal left ventricular systolic function but paradoxical severe low-flow low gradient aortic stenosis.  CT angiography was performed and the patient was referred for surgical consultation.  Patient is widowed and lives alone locally in [place].  She has been retired for several years having previously worked in the [employer] as a Marine scientist.  She has remained physically active and functionally independent throughout retirement.  However, she complains that over the last 2 to 3 years she has developed progressive exertional shortness of breath and fatigue.  She states that she now gets short of breath and tired quite easily, such as going up a single flight of stairs.  She denies resting shortness of breath, PND, orthopnea, dizzy spells, or syncope.  She has occasional palpitations and occasional lower extremity edema.  She now gets short of breath and tired with relatively low activity and this is limiting her lifestyle considerably.  Patient has received both shots for vaccination against COVID-19 vaccine and will be due her booster shot in approximately 1 month.  Past Medical History:  Diagnosis Date  . Arthritis   . Asthma   . CAD (coronary artery disease)    a. NSTEMI 2009 s/p DES to RCAx2.  . Carotid artery disease ([address])    a. Mild-moderate - followed by [facility].  . Chronic diarrhea   . Diabetes mellitus without complication ([place])   . Diverticulosis   . GERD (gastroesophageal reflux disease)   . HTN (hypertension)   . Hyperlipidemia   . Obesity   . Paroxysmal  ventricular tachycardia ([place])   . Rhinitis   . Rotator cuff tear   . Severe aortic stenosis     Past Surgical History:  Procedure  Laterality Date  . ANGIOPLASTY    . CORONARY ANGIOPLASTY WITH STENT PLACEMENT    . RIGHT/LEFT HEART CATH AND CORONARY ANGIOGRAPHY N/A 08/08/2019   Procedure: RIGHT/LEFT HEART CATH AND CORONARY ANGIOGRAPHY;  Surgeon: Belva Crome, MD;  Location: [facility];  Service: Cardiovascular;  Laterality: N/A;  . TONSILLECTOMY    . TOTAL ABDOMINAL HYSTERECTOMY    . TOTAL HIP ARTHROPLASTY      Family History  Problem Relation Age of Onset  . Allergies Mother   . Hyperlipidemia Mother   . Prostate cancer Maternal Grandfather   . Heart attack Father   . Lung disease Neg Hx     Social History   Socioeconomic History  . Marital status: Widowed    Spouse name: Not on file  . Number of children: Not on file  . Years of education: Not on file  . Highest education level: Not on file  Occupational History  . Occupation: retired Therapist, sports  Tobacco Use  . Smoking status: Former Smoker    Packs/day: 1.00    Years: 30.00    Pack years: 30.00    Types: Cigarettes    Quit date: 03/31/1998    Years since quitting: 21.7  . Smokeless tobacco: Never Used  . Tobacco comment: Quit intermittently since her 74s.  Vaping Use  . Vaping Use: Never used  Substance and Sexual Activity  . Alcohol use: Yes    Alcohol/week: 0.0 standard drinks    Comment: Rare.  . Drug use: No  . Sexual activity: Not on file  Other Topics Concern  . Not on file  Social History Narrative   Originally from [place]. She has traveled to [place], [place], [place], & [place]. Previously worked as a Marine scientist. No known TB exposure. No pets currently. No bird, hot tube, or asbestos exposure. Enjoys playing bridge.    Social Determinants of Health   Financial Resource Strain:   . Difficulty of Paying Living Expenses: Not on file  Food Insecurity:   . Worried About Charity fundraiser in the Last Year: Not on file  . Ran Out of Food in the Last Year: Not on file  Transportation Needs:   . Lack of Transportation (Medical): Not on file  . Lack of  Transportation (Non-Medical): Not on file  Physical Activity:   . Days of Exercise per Week: Not on file  . Minutes of Exercise per Session: Not on file  Stress:   . Feeling of Stress : Not on file  Social Connections:   . Frequency of Communication with Friends and Family: Not on file  . Frequency of Social Gatherings with Friends and Family: Not on file  . Attends Religious Services: Not on file  . Active Member of Clubs or Organizations: Not on file  . Attends Archivist Meetings: Not on file  . Marital Status: Not on file  Intimate Partner Violence:   . Fear of Current or Ex-Partner: Not on file  . Emotionally Abused: Not on file  . Physically Abused: Not on file  . Sexually Abused: Not on file    Current Outpatient Medications  Medication Sig Dispense Refill  . albuterol (VENTOLIN HFA) 108 (90 Base) MCG/ACT inhaler Inhale 2 puffs into the lungs every 6 (six) hours as needed for wheezing or shortness of breath.  8 g 3  . aspirin EC 81 MG tablet Take 162 mg by mouth daily.     . B Complex-C (B-COMPLEX WITH VITAMIN C) tablet Take 1 tablet by mouth 2 (two) times a week.    . Budesonide ER 9 MG TB24 Take by mouth daily as needed.    . budesonide-formoterol (SYMBICORT) 80-4.5 MCG/ACT inhaler Inhale 2 puffs into the lungs 2 (two) times daily. 1 Inhaler 11  . BYSTOLIC 5 MG tablet TAKE ONE TABLET BY MOUTH DAILY 90 tablet 3  . calcium carbonate (TUMS - DOSED IN MG ELEMENTAL CALCIUM) 500 MG chewable tablet Chew 1 tablet by mouth 3 (three) times daily as needed. For heartburn.    . clindamycin (CLEOCIN) 300 MG capsule Take 2 capsules (600 mg) 1 hour prior to dental visits. 6 capsule 6  . Evolocumab (REPATHA SURECLICK) 673 MG/ML SOAJ Inject 1 pen into the skin every 14 (fourteen) days. 2 pen 11  . fexofenadine (ALLEGRA) 180 MG tablet Take 180 mg by mouth daily.      . Glucose Blood (FREESTYLE LITE TEST VI) Once daily    . metFORMIN (GLUCOPHAGE-XR) 500 MG 24 hr tablet Take 500 mg by  mouth daily.    . Multiple Vitamins-Minerals (MULTIVITAMIN WITH MINERALS) tablet Take 1 tablet by mouth daily.      . nitroGLYCERIN (NITROSTAT) 0.4 MG SL tablet Place 1 tablet (0.4 mg total) under the tongue every 5 (five) minutes as needed for chest pain. 25 tablet 3  . Omega-3 1000 MG CAPS Take 1,000 mg by mouth daily.    Marland Kitchen OVER THE COUNTER MEDICATION Take 1 capsule by mouth daily. Circulation and vein    . Respiratory Therapy Supplies (FLUTTER) DEVI Use flutter valve 1 times daily as needed    . Turmeric 400 MG CAPS Take 400 mg by mouth daily.     No current facility-administered medications for this visit.    Allergies  Allergen Reactions  . Cefuroxime Axetil Hives and Itching  . Cephalexin Other (See Comments)    Pt. Doesn't remember reaction Total body pain in joints, couldn't swallow   . Codeine     Tolerates Dilaudid.  . Doxycycline     REACTION: vomiting/ GI upset  . Erythromycin Base Other (See Comments)    unknown      Review of Systems:   General:  normal appetite, decreased energy, no weight gain, no weight loss, no fever  Cardiac:  + chest pain with exertion, no chest pain at rest, +SOB with exertion, no resting SOB, no PND, no orthopnea, + palpitations, no arrhythmia, no atrial fibrillation, occasional LE edema, no dizzy spells, no syncope  Respiratory:  + exertional shortness of breath, no home oxygen, + intermittent chronic productive cough, no dry cough, + chronic bronchitis, no wheezing, no hemoptysis, no asthma, no pain with inspiration or cough, no sleep apnea, no CPAP at night  GI:   no difficulty swallowing, no reflux, no frequent heartburn, + hiatal hernia, no abdominal pain, + constipation, + diarrhea, no hematochezia, no hematemesis, no melena  GU:   no dysuria,  no frequency, no urinary tract infection, no hematuria, no kidney stones, no kidney disease  Vascular:  no pain suggestive of claudication, no pain in feet, no leg cramps, + varicose veins, no  DVT, no non-healing foot ulcer  Neuro:   no stroke, no TIA's, no seizures, no headaches, + occular migraines, no temporary blindness one eye,  no slurred speech, + peripheral neuropathy, no chronic pain,  no instability of gait, no memory/cognitive dysfunction  Musculoskeletal: + arthritis particularly both knees, + joint swelling, no myalgias, no difficulty walking, normal mobility   Skin:   no rash, no itching, no skin infections, no pressure sores or ulcerations  Psych:   no anxiety, no depression, no nervousness, no unusual recent stress  Eyes:   no blurry vision, occasional floaters, no recent vision changes, does not wear glasses or contacts  ENT:   no hearing loss, no loose or painful teeth, no dentures, last saw dentist 12/06/2019  Hematologic:  no easy bruising, no abnormal bleeding, no clotting disorder, no frequent epistaxis  Endocrine:  + diabetes, does check CBG's at home           Physical Exam:   BP (!) 170/67   Pulse 96   Temp 97.6 F (36.4 C) (Skin)   Resp 20   Ht 5\' 3"  (1.6 m)   Wt 140 lb (63.5 kg)   SpO2 98% Comment: RA  BMI 24.80 kg/m   General:    well-appearing  HEENT:  Unremarkable   Neck:   no JVD, no bruits, no adenopathy   Chest:   clear to auscultation, symmetrical breath sounds, no wheezes, no rhonchi   CV:   RRR, grade IV/VI crescendo/decrescendo murmur heard best at RSB,  no diastolic murmur  Abdomen:  soft, non-tender, no masses   Extremities:  warm, well-perfused, pulses diminished but palpable, no LE edema  Rectal/GU  Deferred  Neuro:   Grossly non-focal and symmetrical throughout  Skin:   Clean and dry, no rashes, no breakdown   Diagnostic Tests:  EKG: NSR w/out significant AV conduction delay (08/08/2019)     RIGHT/LEFT HEART CATH AND CORONARY ANGIOGRAPHY  Conclusion   Moderate aortic stenosis with peak to peak gradient of 33 mmHg, mean gradient 29 mmHg, and calculated aortic valve area of 0.81 cm.  Normal pulmonary pressures with  mean arterial pressure of 23 mmHg.  3 mmHg gradient across the mitral valve  Normal left ventricular systolic function with EF 65%.  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  Luminal irregularities are noted throughout the LAD territory.  Luminal irregularities noted throughout the circumflex.  The RCA is dominant, tortuous, and contains a patent mid to distal stent beyond several acute bends.  Diffuse luminal irregularities are noted otherwise.  Scattered significant calcification is noted in the coronary arterial tree, the thoracic aorta, and mitral annulus.  RECOMMENDATIONS:   There is discordance between aortic stenosis and coronary disease severity and symptoms.  Dyspnea on exertion may be multifactorial.  LAD aneurysm is stable.  Consider referral to aortic valve clinic for an opinion.  May need pulmonary evaluation.  May need transesophageal echo to exclude the possibility of mitral valve disease being more significant than suggested by current data. Recommendations  Antiplatelet/Anticoag Recommend Aspirin 81mg  daily for moderate CAD.  Surgeon Notes    08/08/2019 11:08 AM CV Procedure signed by Belva Crome, MD  Indications  Aortic stenosis, moderate [I35.0 (ICD-10-CM)]  CAD in native artery [I25.10 (ICD-10-CM)]  Nonrheumatic mitral valve stenosis with insufficiency [I34.2, I34.0 (ICD-10-CM)]  Procedural Details  Technical Details The right radial area was sterilely prepped and draped. Intravenous sedation with Versed and fentanyl was administered. 1% Xylocaine was infiltrated to achieve local analgesia. Using real-time vascular ultrasound, a double wall stick with an angiocath was utilized to obtain intra-arterial access. A VUS image was saved  for the permanent record.The modified Seldinger technique was used to place a 67F " Slender" sheath in the right radial artery.  Weight based heparin was administered. Coronary angiography was done using 5 F catheters. Right coronary angiography was performed with a JR4. Transvalvular pressure and Left ventricular hemodymic recordings and angiography was done using the JR 4 catheter and hand injection. The valve was crossed with the assistance of a straight wire. Left coronary angiography was performed with a JL 3.5 cm.  Right heart catheterization was performed by exchanging a previously placed antecubital IV angio-cath for a 5 French Slender sheath. 1% Xylocaine was used to locally nesthetize the area around the IV site. The IV catheter was wired using an .018 guidewire. The modified Seldinger technique was used to place the 5 [place] sheath. Double glove technique was used to enhance sterility. After sheath insertion, right heart cath was performed using a 5 French balloon tipped catheter and fluoroscopic guidance. Pressures were recorded in each chamber and in the pulmonary capillary wedge position.. The main pulmonary artery O2 saturation was sampled.   Hemostasis was achieved using a pneumatic band.  During this procedure the patient is administered a total of Versed 2 mg and Fentanyl 25 mg to achieve and maintain moderate conscious sedation.  The patient's heart rate, blood pressure, and oxygen saturation are monitored continuously during the procedure. The period of conscious sedation is 37 minutes, of which I was present face-to-face 100% of this time. Estimated blood loss <50 mL.   During this procedure medications were administered to achieve and maintain moderate conscious sedation while the patient's heart rate, blood pressure, and oxygen saturation were continuously monitored and I was present face-to-face 100% of this time.  Medications (Filter: Administrations occurring from 0902 to 1050 on 08/08/19) Heparin (Porcine) in NaCl 1000-0.9 UT/500ML-% SOLN (mL) Total volume:  1,000 mL Date/Time  Rate/Dose/Volume Action    08/08/19 0912  500 mL Given  0913  500 mL Given    fentaNYL (SUBLIMAZE) injection (mcg) Total dose:  50 mcg Date/Time  Rate/Dose/Volume Action  08/08/19 0946  25 mcg Given  1006  25 mcg Given    midazolam (VERSED) injection (mg) Total dose:  2 mg Date/Time  Rate/Dose/Volume Action  08/08/19 0946  1 mg Given  0958  1 mg Given    lidocaine (PF) (XYLOCAINE) 1 % injection (mL) Total volume:  4 mL Date/Time  Rate/Dose/Volume Action  08/08/19 0957  2 mL Given  1000  2 mL Given    Radial Cocktail/Verapamil only (mL) Total volume:  10 mL Date/Time  Rate/Dose/Volume Action  08/08/19 1008  10 mL Given    heparin sodium (porcine) injection (Units) Total dose:  3,000 Units Date/Time  Rate/Dose/Volume Action  08/08/19 1022  3,000 Units Given    iohexol (OMNIPAQUE) 350 MG/ML injection (mL) Total volume:  80 mL Date/Time  Rate/Dose/Volume Action  08/08/19 1042  80 mL Given    Sedation Time  Sedation Time Physician-1: 54 minutes 52 seconds  Contrast  Medication Name Total Dose  iohexol (OMNIPAQUE) 350 MG/ML injection 80 mL    Radiation/Fluoro  Fluoro time: 8.7 (min) DAP: 13.1 (Gycm2) Cumulative Air Kerma: 266.9 (mGy)  Coronary Findings  Diagnostic Dominance: Right Left Anterior Descending  The vessel exhibits minimal luminal irregularities.  Non-stenotic Ost LAD lesion.  Prox LAD to Mid LAD lesion is 40% stenosed.  Left Circumflex  There is mild diffuse disease throughout the vessel.  First Obtuse Marginal Branch  Right Coronary Artery  .  Intervention  No interventions have been documented. Right Heart  Right Heart Pressures Hemodynamic findings consistent with pulmonary hypertension. LV EDP is normal.  Right Atrium The right atrial size is normal.  Wall Motion  Resting               Left Heart  Left Ventricle The left ventricular size is normal. The left ventricular systolic function is normal. LV end diastolic pressure is normal. The left  ventricular ejection fraction is 55-65% by visual estimate. No regional wall motion abnormalities. There is no evidence of mitral regurgitation.  Coronary Diagrams  Diagnostic Dominance: Right  Intervention  Implants   No implant documentation for this case.  Syngo Images  Show images for CARDIAC CATHETERIZATION Images on Long Term Storage  Show images for Hernandez, Cheryl A "[name]" Link to Procedure Log  Procedure Log    Hemo Data   Most Recent Value  Fick Cardiac Output 5.23 L/min  Fick Cardiac Output Index 3.13 (L/min)/BSA  Aortic Mean Gradient 28.6 mmHg  Aortic Peak Gradient 29 mmHg  Aortic Valve Area 0.81  Aortic Value Area Index 0.48 cm2/BSA  RA A Wave 7 mmHg  RA V Wave 5 mmHg  RA Mean 5 mmHg  RV Systolic Pressure 36 mmHg  RV Diastolic Pressure 1 mmHg  RV EDP 6 mmHg  PA Systolic Pressure 30 mmHg  PA Diastolic Pressure 11 mmHg  PA Mean 23 mmHg  PW A Wave 18 mmHg  PW V Wave 26 mmHg  PW Mean 16 mmHg  AO Systolic Pressure 366 mmHg  AO Diastolic Pressure 60 mmHg  AO Mean 91 mmHg  LV Systolic Pressure 294 mmHg  LV Diastolic Pressure 4 mmHg  LV EDP 13 mmHg  AOp Systolic Pressure 765 mmHg  AOp Diastolic Pressure 65 mmHg  AOp Mean Pressure 465 mmHg  LVp Systolic Pressure 035 mmHg  LVp Diastolic Pressure 8 mmHg  LVp EDP Pressure 18 mmHg  QP/QS 1  TPVR Index 7.34 HRUI  TSVR Index 29.01 HRUI  PVR SVR Ratio 0.08  TPVR/TSVR Ratio 0.25      ECHOCARDIOGRAM REPORT       Patient Name:  Cheryl Hernandez Date of Exam: 10/07/2019  Medical Rec #: [id number]     Height:    63.0 in  Accession #:  [id number]    Weight:    146.0 lb  Date of Birth: 04-13-1940     BSA:     1.692 m  Patient Age:  65 years     BP:      122/64 mmHg  Patient Gender: F         HR:      84 bpm.  Exam Location: [address]   Procedure: 2D Echo, 3D Echo, Cardiac Doppler, Color Doppler and Strain  Analysis   Indications:  I35  Aortic stenosis    History:    Patient has prior history of Echocardiogram examinations,  most         recent 01/20/2019. Aortic Valve Disease,         Arrythmias:Tachycardia., Signs/Symptoms:Dyspnea; Risk         Factors:Hypertension, Diabetes, Dyslipidemia and Obesity.    Sonographer:  Jessee Avers, RDCS  Referring Phys: [place]    1. Left ventricular ejection fraction, by estimation, is 60 to 65%. The  left ventricle has normal function. The left ventricle has no regional  wall motion abnormalities. There is mild left ventricular hypertrophy.  Left ventricular diastolic function  could not be evaluated. The average left ventricular global longitudinal  strain is -21.8 %. The global longitudinal strain is normal.  2. Right ventricular systolic function is normal. The right ventricular  size is normal. There is mildly elevated pulmonary artery systolic  pressure.  3. The mitral valve is abnormal. Mild mitral valve regurgitation. Mild  mitral stenosis. The mean mitral valve gradient is 8.0 mmHg with average  heart rate of 84 bpm.  4. The aortic valve is tricuspid. Aortic valve regurgitation is trivial.  Severe aortic valve stenosis. Aortic valve area, by VTI measures 0.83 cm.  Aortic valve mean gradient measures 31.0 mmHg. Aortic valve Vmax measures  3.54 m/s.  5. The inferior vena cava is normal in size with greater than 50%  respiratory variability, suggesting right atrial pressure of 3 mmHg.   Comparison(s): 01/20/19 EF 60-65%. Moderate AS 39mmHg mean PG, 53mmHg peak  PG.   FINDINGS  Left Ventricle: Left ventricular ejection fraction, by estimation, is 60  to 65%. The left ventricle has normal function. The left ventricle has no  regional wall motion abnormalities. The average left ventricular global  longitudinal strain is -21.8 %.  The global longitudinal strain is normal. The left ventricular internal   cavity size was normal in size. There is mild left ventricular  hypertrophy. Left ventricular diastolic function could not be evaluated  due to mitral annular calcification (moderate  or greater). Left ventricular diastolic function could not be evaluated.   Right Ventricle: The right ventricular size is normal. No increase in  right ventricular wall thickness. Right ventricular systolic function is  normal. There is mildly elevated pulmonary artery systolic pressure. The  tricuspid regurgitant velocity is 3.08  m/s, and with an assumed right atrial pressure of 3 mmHg, the estimated  right ventricular systolic pressure is 97.9 mmHg.   Left Atrium: Left atrial size was normal in size.   Right Atrium: Right atrial size was normal in size.   Pericardium: There is no evidence of pericardial effusion.   Mitral Valve: The mitral valve is abnormal. There is mild calcification of  the posterior and anterior mitral valve leaflet(s). Mild to moderate  mitral annular calcification. Mild mitral valve regurgitation. Mild mitral  valve stenosis. MV peak gradient,  20.4 mmHg. The mean mitral valve gradient is 8.0 mmHg with average heart  rate of 84 bpm.   Tricuspid Valve: The tricuspid valve is grossly normal. Tricuspid valve  regurgitation is trivial.   Aortic Valve: The aortic valve is tricuspid. . There is moderate  thickening and severe calcifcation of the aortic valve. Aortic valve  regurgitation is trivial. Aortic regurgitation PHT measures 235 msec.  Severe aortic stenosis is present. There is  moderate thickening of the aortic valve. There is severe calcifcation of  the aortic valve. Aortic valve mean gradient measures 31.0 mmHg. Aortic  valve peak gradient measures 50.1 mmHg. Aortic valve area, by VTI measures  0.83 cm.   Pulmonic Valve: The pulmonic valve was grossly normal. Pulmonic valve  regurgitation is trivial.   Aorta: The aortic root and ascending aorta are structurally  normal, with  no evidence of dilitation.   Venous: The inferior vena cava is normal in size with greater than 50%  respiratory variability, suggesting right atrial pressure of 3 mmHg.   IAS/Shunts: No atrial level shunt detected by color flow Doppler.     LEFT VENTRICLE  PLAX 2D  LVIDd:     3.80 cm Diastology  LVIDs:     2.50  cm LV e' lateral:  5.78 cm/s  LV PW:     0.70 cm LV E/e' lateral: 34.1  LV IVS:    1.20 cm LV e' medial:  7.05 cm/s  LVOT diam:   1.90 cm LV E/e' medial: 27.9  LV SV:     73  LV SV Index:  43    2D Longitudinal Strain  LVOT Area:   2.84 cm 2D Strain GLS (A2C):  -21.9 %             2D Strain GLS (A3C):  -22.1 %             2D Strain GLS (A4C):  -21.5 %             2D Strain GLS Avg:   -21.8 %               3D Volume EF:             3D EF:    61 %             LV EDV:    94 ml             LV ESV:    37 ml             LV SV:    57 ml   RIGHT VENTRICLE  RV Basal diam: 3.30 cm  RV S prime:   10.70 cm/s  TAPSE (M-mode): 2.2 cm  RVSP:      45.9 mmHg   LEFT ATRIUM       Index    RIGHT ATRIUM      Index  LA diam:    3.80 cm 2.25 cm/m RA Pressure: 8.00 mmHg  LA Vol (A2C):  34.5 ml 20.40 ml/m RA Area:   9.98 cm  LA Vol (A4C):  39.1 ml 23.11 ml/m RA Volume:  17.30 ml 10.23 ml/m  LA Biplane Vol: 38.9 ml 23.00 ml/m  AORTIC VALVE  AV Area (Vmax):  0.89 cm  AV Area (Vmean):  0.86 cm  AV Area (VTI):   0.83 cm  AV Vmax:      354.00 cm/s  AV Vmean:     252.250 cm/s  AV VTI:      0.872 m  AV Peak Grad:   50.1 mmHg  AV Mean Grad:   31.0 mmHg  LVOT Vmax:     111.00 cm/s  LVOT Vmean:    76.100 cm/s  LVOT VTI:     0.256 m  LVOT/AV VTI ratio: 0.29  AI PHT:      235 msec    AORTA  Ao Root diam: 3.10 cm  Ao Asc diam:  3.00 cm   MITRAL VALVE        TRICUSPID VALVE               TR Peak grad:  37.9 mmHg               TR Vmax:    308.00 cm/s  MV Mean grad: 8.0 mmHg   Estimated RAP: 8.00 mmHg  MV Vmax:    2.26 m/s   RVSP:      45.9 mmHg  MV Vmean:   125.0 cm/s  MV Decel Time: 162 msec   SHUNTS  MV E velocity: 197.00 cm/s Systemic VTI: 0.26 m  MV A velocity: 108.00 cm/s Systemic Diam: 1.90 cm  MV E/A ratio: 1.82   Lyman Bishop MD  Electronically signed by Chrissie Noa  Hilty MD  Signature Date/Time: 10/07/2019/10:34:36 AM     Cardiac TAVR CT  TECHNIQUE: The patient was scanned on a Siemens Force 629 slice scanner. A 120 kV retrospective scan was triggered in the descending thoracic aorta at 111 HU's. Gantry rotation speed was 270 msecs and collimation was .9 mm. No beta blockade or nitro were given. The 3D data set was reconstructed in 5% intervals of the R-R cycle. Systolic and diastolic phases were analyzed on a dedicated work station using MPR, MIP and VRT modes. The patient received 142mL OMNIPAQUE IOHEXOL 350 MG/ML SOLN of contrast.  FINDINGS: Aortic Valve: Tricuspid aortic valve. Severely reduced cusp separation. Severely thickened, moderately calcified aortic valve cusps.  AV calcium score: 839  Virtual Basal Annulus Measurements:  Maximum/Minimum Diameter: 21.9 x 19.3 mm  Perimeter: 64.4 mm  Area: 321 mm2  LVOT calcifications extending to anterior mitral leaflet.  Based on these measurements, the annulus measures within range for a 20 mm Edwards Sapien 3 valve. Sizing is borderline for 23 mm vs 26 mm Medtronic Evolut Pro supra-annular valve.  Sinus of Valsalva Measurements:  Non-coronary:  26 mm  Right - coronary:  26 mm  Left - coronary:  27 mm  Sinus of Valsalva Height:  Left: 16.2 mm  Right: 17.6 mm  Aorta: Severe mixed atherosclerotic plaque.  Sinotubular Junction:  25  mm  Ascending Thoracic Aorta:   mm  Aortic Arch:   mm  Descending Thoracic Aorta:   mm  Coronary Artery Height above Annulus:  Left Main: 11.1 mm  Right Coronary: 15.2 mm  Coronary Arteries: 3 vessel coronary artery disease.  Optimum Fluoroscopic Angle for Delivery: LAO 1 CAU 1  No left atrial appendage thrombus.  Moderate mitral annular calcification.  IMPRESSION: 1. Aortic Valve: Tricuspid aortic valve. Severely reduced cusp separation. Severely thickened, moderately calcified aortic valve cusps.  2.  AV calcium score: 839  3.  LVOT calcifications extending to anterior mitral leaflet.  4. Borderline height of the left main coronary artery ostia, 11 mm. Adequate height of the RCA ostium.  5. Based on these findings, the annulus measures within range for a 20 mm Edwards Sapien 3 valve. Sizing is borderline for 23 mm vs 26 mm Medtronic Evolut Pro supra-annular valve. Recommend Structural Heart Team discussion for valve selection.  6. Optimum Fluoroscopic Angle for Delivery: LAO 1 CAU 1   Electronically Signed   By: Cherlynn Kaiser   On: 11/09/2019 07:58    CT ANGIOGRAPHY CHEST, ABDOMEN AND PELVIS  TECHNIQUE: Multidetector CT imaging through the chest, abdomen and pelvis was performed using the standard protocol during bolus administration of intravenous contrast. Multiplanar reconstructed images and MIPs were obtained and reviewed to evaluate the vascular anatomy.  CONTRAST:  124mL OMNIPAQUE IOHEXOL 350 MG/ML SOLN  COMPARISON:  10/27/2014 chest CT angiogram.  FINDINGS: CTA CHEST FINDINGS  Cardiovascular: Borderline mild cardiomegaly. No significant pericardial effusion/thickening. Diffusely thickened and coarsely calcified aortic valve. Three-vessel coronary atherosclerosis. Atherosclerotic nonaneurysmal thoracic aorta. Normal caliber pulmonary arteries. No central pulmonary emboli.  Mediastinum/Nodes: Subcentimeter  hypodense bilateral thyroid nodules, unchanged. Not clinically significant; no follow-up imaging recommended (ref: J Am Coll Radiol. 2015 Feb;12(2): 143-50). Unremarkable esophagus. No pathologically enlarged axillary, mediastinal or hilar lymph nodes.  Lungs/Pleura: No pneumothorax. No pleural effusion. Tiny 3 mm perifissural nodule in the right middle lobe along the minor fissure (series 5/image 40) is stable since 2016 CT and considered benign. No acute consolidative airspace disease, lung masses or new significant pulmonary nodules.  Musculoskeletal:  No aggressive appearing focal osseous lesions. Moderate thoracic spondylosis.  CTA ABDOMEN AND PELVIS FINDINGS  Hepatobiliary: Normal liver with no liver mass. Normal gallbladder with no radiopaque cholelithiasis. No biliary ductal dilatation.  Pancreas: Normal, with no mass or duct dilation.  Spleen: Normal size. No mass.  Adrenals/Urinary Tract: Right adrenal 1.7 cm nodule with density 125 HU, stable since 10/27/2014 CT, compatible with a benign adenoma. No hydronephrosis. Scattered subcentimeter hypodense renal cortical lesions in both kidneys are too small to characterize and require no follow-up. No suspicious contour deforming renal masses. Bladder obscured by streak artifact from right hip hardware. No gross bladder abnormality.  Stomach/Bowel: Small hiatal hernia. Otherwise normal nondistended stomach. Normal caliber small bowel with no small bowel wall thickening. Normal appendix. Mild sigmoid diverticulosis with no large bowel wall thickening or significant pericolonic fat stranding.  Vascular/Lymphatic: Atherosclerotic nonaneurysmal abdominal aorta. Patent splenic and renal veins. No pathologically enlarged lymph nodes in the abdomen or pelvis.  Reproductive: Status post hysterectomy, with no abnormal findings at the vaginal cuff. No adnexal mass.  Other: No pneumoperitoneum, ascites or focal fluid  collection.  Musculoskeletal: No aggressive appearing focal osseous lesions. Marked lumbar spondylosis. Right total hip arthroplasty.  VASCULAR MEASUREMENTS PERTINENT TO TAVR:  AORTA:  Minimal Aortic Diameter-10.0 x 9.7 mm  Severity of Aortic Calcification-moderate to severe  RIGHT PELVIS:  Right Common Iliac Artery -  Minimal Diameter-7.1 x 5.7 mm  Tortuosity-moderate  Calcification-moderate  Right External Iliac Artery -  Minimal Diameter-4.9 x 4.4 mm  Tortuosity-mild  Calcification-none  Right Common Femoral Artery -  Minimal Diameter-6.4 x 6.2 mm  Tortuosity-mild  Calcification-none  LEFT PELVIS:  Left Common Iliac Artery -  Minimal Diameter-7.6 x 7.5 mm  Tortuosity-mild  Calcification-mild  Left External Iliac Artery -  Minimal Diameter-5.3 x 5.2 mm  Tortuosity-mild  Calcification-none  There is a beaded appearance to the left external iliac artery.  Left Common Femoral Artery -  Minimal Diameter-6.3 x 6.1 mm  Tortuosity-mild  Calcification-none  Review of the MIP images confirms the above findings.  IMPRESSION: 1. Vascular findings and measurements pertinent to potential TAVR procedure, as detailed. 2. Diffusely thickened and calcified aortic valve, compatible with reported history of aortic valve stenosis. 3. Three-vessel coronary atherosclerosis. 4. Borderline mild cardiomegaly. 5. Small hiatal hernia. 6. Stable right adrenal adenoma. 7. Mild sigmoid diverticulosis. 8. Aortic Atherosclerosis (ICD10-I70.0).   Electronically Signed   By: Ilona Sorrel M.D.   On: 11/03/2019 14:21    Impression:  Patient has stage D3 severe paradoxical low flow, low gradient aortic stenosis.  She describes progressive symptoms of exertional shortness of breath and fatigue consistent with chronic diastolic congestive heart failure, New York Heart Association functional class III.  I have personally  reviewed the patient's most recent transthoracic echocardiogram, diagnostic cardiac catheterization, and CT angiograms.  Echocardiogram reveals trileaflet aortic valve with severe thickening, calcification, and restricted leaflet mobility involving all 3 leaflets of the aortic valve.  The aortic annulus is also relatively small diameter.  Peak velocity across the aortic valve measured 3.5 m/s corresponding to mean transvalvular gradient estimated 31 mmHg but aortic valve area calculated only 0.83 cm by VTI.  The DVI was measured 0.29 and stroke-volume index 43.  Diagnostic cardiac catheterization confirmed the presence of severe aortic stenosis with mean transvalvular gradient measured 29 mmHg but aortic valve area calculated only 0.81 cm.  Right heart pressures were normal.  Catheterization was also notable for the absence of significant coronary artery disease.  Cardiac-gated CTA of  the heart reveals anatomical characteristics consistent with aortic stenosis suitable for treatment by transcatheter aortic valve replacement without any significant complicating features although there is some calcification in the aortic annulus which extends into the left ventricular outflow tract which might slightly increase risk of paravalvular leak.  CTA of the aorta and iliac vessels demonstrate what appears to be adequate pelvic vascular access to facilitate a transfemoral approach.  Baseline EKG reveals sinus rhythm without significant AV conduction delay.  I agree the patient would benefit from aortic valve replacement.  Risks associated with conventional surgery would be slightly elevated because of the patient's age and more importantly by the presence of relatively small aortic root which would likely require root enlargement or root replacement to avoid patient prosthesis mismatch.  The patient also has significant calcification near the sinotubular junction.  Under the circumstances I favor transcatheter aortic valve  replacement.     Plan:  The patient counseled at length regarding treatment alternatives for management of severe symptomatic aortic stenosis. Alternative approaches such as conventional aortic valve replacement, transcatheter aortic valve replacement, and continued medical therapy without intervention were compared and contrasted at length.  The risks associated with conventional surgical aortic valve replacement were discussed in detail, as were expectations for post-operative convalescence, alternative surgical approaches, prosthetic valve choices, and the presence or absence of other concomitant conditions which might require intervention.  Issues specific to transcatheter aortic valve replacement were discussed including questions about long term valve durability, the potential for paravalvular leak, possible increased risk of need for permanent pacemaker placement, the suitability of the patient's surgical anatomy, and other potential technical complications related to the procedure itself.  Long-term prognosis without surgical intervention was discussed.  All treatment options were discussed in the context of the patient's own specific clinical presentation, past medical history, long-term prognosis and life expectancy.  All of her questions have been addressed.  The patient desires to proceed with transcatheter aortic valve replacement in the near future.  We tentatively plan to proceed with surgery on December 20, 2019.  Following the decision to proceed with transcatheter aortic valve replacement, a discussion has been held regarding what types of management strategies would be attempted intraoperatively in the event of life-threatening complications, including whether or not the patient would be considered a candidate for the use of cardiopulmonary bypass and/or conversion to open sternotomy for attempted surgical intervention.  The patient specifically requests that should a potentially  life-threatening complication develop we would attempt emergency median sternotomy and/or other aggressive surgical procedures.  The patient has been advised of a variety of complications that might develop including but not limited to risks of death, stroke, paravalvular leak, aortic dissection or other major vascular complications, aortic annulus rupture, device embolization, cardiac rupture or perforation, mitral regurgitation, acute myocardial infarction, arrhythmia, heart block or bradycardia requiring permanent pacemaker placement, congestive heart failure, respiratory failure, renal failure, pneumonia, infection, other late complications related to structural valve deterioration or migration, or other complications that might ultimately cause a temporary or permanent loss of functional independence or other long term morbidity.  The patient provides full informed consent for the procedure as described and all questions were answered.    I spent in excess of 90 minutes during the conduct of this office consultation and >50% of this time involved direct face-to-face encounter with the patient for counseling and/or coordination of their care.     Valentina Gu. Roxy Manns, MD 12/12/2019 9:42 AM

## 2019-12-13 ENCOUNTER — Other Ambulatory Visit: Payer: Self-pay

## 2019-12-13 DIAGNOSIS — I350 Nonrheumatic aortic (valve) stenosis: Secondary | ICD-10-CM

## 2019-12-15 NOTE — Progress Notes (Addendum)
Your procedure is scheduled on Tuesday, September 21st.  Report to [facility] Main Entrance "A" at 7:00 A.M., and check in at the Admitting office.  Call this number if you have problems the morning of surgery:  [phone]  Call [phone] if you have any questions prior to your surgery date Monday-Friday 8am-4pm   Remember:  Do not eat or drink after midnight the night before your surgery    Take these medicines the morning of surgery with A SIP OF WATER: NONE   As of today, STOP taking any Aspirin (unless otherwise instructed by your surgeon) Aleve, Naproxen, Ibuprofen, Motrin, Advil, Goody's, BC's, all herbal medications, fish oil, and all vitamins.  WHAT DO I DO ABOUT MY DIABETES MEDICATION?  Per surgeon's order: Stop taking Metformin on 9/19. You will take your last dose on 9/18 (Saturday).  HOW TO MANAGE YOUR DIABETES BEFORE AND AFTER SURGERY  Why is it important to control my blood sugar before and after surgery? . Improving blood sugar levels before and after surgery helps healing and can limit problems. . A way of improving blood sugar control is eating a healthy diet by: o  Eating less sugar and carbohydrates o  Increasing activity/exercise o  Talking with your doctor about reaching your blood sugar goals . High blood sugars (greater than 180 mg/dL) can raise your risk of infections and slow your recovery, so you will need to focus on controlling your diabetes during the weeks before surgery. . Make sure that the doctor who takes care of your diabetes knows about your planned surgery including the date and location.  How do I manage my blood sugar before surgery? . Check your blood sugar at least 4 times a day, starting 2 days before surgery, to make sure that the level is not too high or low. . Check your blood sugar the morning of your surgery when you wake up and every 2 hours until you get to the Short Stay unit. o If your blood sugar is less than 70 mg/dL, you  will need to treat for low blood sugar: - Do not take insulin. - Treat a low blood sugar (less than 70 mg/dL) with  cup of clear juice (cranberry or apple), 4 glucose tablets, OR glucose gel. - Recheck blood sugar in 15 minutes after treatment (to make sure it is greater than 70 mg/dL). If your blood sugar is not greater than 70 mg/dL on recheck, call [phone] for further instructions. . Report your blood sugar to the short stay nurse when you get to Short Stay.  . If you are admitted to the hospital after surgery: o Your blood sugar will be checked by the staff and you will probably be given insulin after surgery (instead of oral diabetes medicines) to make sure you have good blood sugar levels. o The goal for blood sugar control after surgery is 80-180 mg/dL.             Do not wear jewelry, make up, or nail polish            Do not wear lotions, powders, perfumes, or deodorant.            Do not shave 48 hours prior to surgery.              Do not bring valuables to the hospital.            [facility] is not responsible for any belongings or valuables.  Do NOT Smoke (  Tobacco/Vaping) or drink Alcohol 24 hours prior to your procedure If you use a CPAP at night, you may bring all equipment for your overnight stay.   Contacts, glasses, dentures or bridgework may not be worn into surgery.      For patients admitted to the hospital, discharge time will be determined by your treatment team.   Patients discharged the day of surgery will not be allowed to drive home, and someone needs to stay with them for 24 hours.  Special instructions:   [place]- Preparing For Surgery  Before surgery, you can play an important role. Because skin is not sterile, your skin needs to be as free of germs as possible. You can reduce the number of germs on your skin by washing with CHG (chlorahexidine gluconate) Soap before surgery.  CHG is an antiseptic cleaner which kills germs and bonds with the skin  to continue killing germs even after washing.    Oral Hygiene is also important to reduce your risk of infection.  Remember - BRUSH YOUR TEETH THE MORNING OF SURGERY WITH YOUR REGULAR TOOTHPASTE  Please do not use if you have an allergy to CHG or antibacterial soaps. If your skin becomes reddened/irritated stop using the CHG.  Do not shave (including legs and underarms) for at least 48 hours prior to first CHG shower. It is OK to shave your face.  Please follow these instructions carefully.   1. Shower the NIGHT BEFORE SURGERY and the MORNING OF SURGERY with CHG Soap.   2. If you chose to wash your hair, wash your hair first as usual with your normal shampoo.  3. After you shampoo, rinse your hair and body thoroughly to remove the shampoo.  4. Use CHG as you would any other liquid soap. You can apply CHG directly to the skin and wash gently with a scrungie or a clean washcloth.   5. Apply the CHG Soap to your body ONLY FROM THE NECK DOWN.  Do not use on open wounds or open sores. Avoid contact with your eyes, ears, mouth and genitals (private parts). Wash Face and genitals (private parts)  with your normal soap.   6. Wash thoroughly, paying special attention to the area where your surgery will be performed.  7. Thoroughly rinse your body with warm water from the neck down.  8. DO NOT shower/wash with your normal soap after using and rinsing off the CHG Soap.  9. Pat yourself dry with a CLEAN TOWEL.  10. Wear CLEAN PAJAMAS to bed the night before surgery  11. Place CLEAN SHEETS on your bed the night of your first shower and DO NOT SLEEP WITH PETS.  Day of Surgery: Wear Clean/Comfortable clothing the morning of surgery Do not apply any deodorants/lotions.   Remember to brush your teeth WITH YOUR REGULAR TOOTHPASTE.   Please read over the following fact sheets that you were given.

## 2019-12-16 ENCOUNTER — Other Ambulatory Visit: Payer: Self-pay

## 2019-12-16 ENCOUNTER — Encounter (HOSPITAL_COMMUNITY): Payer: Self-pay

## 2019-12-16 ENCOUNTER — Ambulatory Visit (HOSPITAL_COMMUNITY)
Admission: RE | Admit: 2019-12-16 | Discharge: 2019-12-16 | Disposition: A | Discharge status: Home / Self Care | Payer: Medicare Other | Source: Ambulatory Visit | Attending: Cardiovascular Disease | Admitting: Cardiovascular Disease

## 2019-12-16 ENCOUNTER — Other Ambulatory Visit (HOSPITAL_COMMUNITY): Payer: Medicare Other

## 2019-12-16 ENCOUNTER — Other Ambulatory Visit: Payer: Self-pay | Admitting: Physician Assistant

## 2019-12-16 ENCOUNTER — Encounter (HOSPITAL_COMMUNITY)
Admission: RE | Admit: 2019-12-16 | Discharge: 2019-12-16 | Disposition: A | Discharge status: Home / Self Care | Payer: Medicare Other | Source: Ambulatory Visit | Attending: Cardiovascular Disease | Admitting: Cardiovascular Disease

## 2019-12-16 DIAGNOSIS — Z01818 Encounter for other preprocedural examination: Secondary | ICD-10-CM | POA: Insufficient documentation

## 2019-12-16 DIAGNOSIS — I350 Nonrheumatic aortic (valve) stenosis: Secondary | ICD-10-CM | POA: Diagnosis not present

## 2019-12-16 DIAGNOSIS — R918 Other nonspecific abnormal finding of lung field: Secondary | ICD-10-CM | POA: Diagnosis not present

## 2019-12-16 HISTORY — DX: Unspecified chronic bronchitis: J42

## 2019-12-16 HISTORY — DX: Polyneuropathy, unspecified: G62.9

## 2019-12-16 HISTORY — DX: Personal history of other diseases of the nervous system and sense organs: Z86.69

## 2019-12-16 LAB — URINALYSIS, ROUTINE W REFLEX MICROSCOPIC
Bilirubin Urine: NEGATIVE
Glucose, UA: NEGATIVE mg/dL
Ketones, ur: NEGATIVE mg/dL
Nitrite: POSITIVE — AB
Protein, ur: NEGATIVE mg/dL
Specific Gravity, Urine: 1.018 (ref 1.005–1.030)
WBC, UA: >50 WBC/hpf — ABNORMAL HIGH (ref 0–5)
pH: 5 (ref 5.0–8.0)

## 2019-12-16 LAB — COMPREHENSIVE METABOLIC PANEL
ALT: 17 U/L (ref 0–44)
AST: 21 U/L (ref 15–41)
Albumin: 3.9 g/dL (ref 3.5–5.0)
Alkaline Phosphatase: 67 U/L (ref 38–126)
Anion gap: 10 (ref 5–15)
BUN: 14 mg/dL (ref 8–23)
CO2: 22 mmol/L (ref 22–32)
Calcium: 9.4 mg/dL (ref 8.9–10.3)
Chloride: 104 mmol/L (ref 98–111)
Creatinine, Ser: 0.72 mg/dL (ref 0.44–1.00)
GFR calc Af Amer: >60 mL/min (ref >60)
GFR calc non Af Amer: >60 mL/min (ref >60)
Glucose, Bld: 104 mg/dL — ABNORMAL HIGH (ref 70–99)
Potassium: 4.4 mmol/L (ref 3.5–5.1)
Sodium: 136 mmol/L (ref 135–145)
Total Bilirubin: 0.6 mg/dL (ref 0.3–1.2)
Total Protein: 7 g/dL (ref 6.5–8.1)

## 2019-12-16 LAB — BLOOD GAS, ARTERIAL
Acid-Base Excess: 0.4 mmol/L (ref 0.0–2.0)
Bicarbonate: 24.5 mmol/L (ref 20.0–28.0)
Drawn by: 58793
FIO2: 21
O2 Saturation: 97.8 %
Patient temperature: 37
pCO2 arterial: 39.2 mmHg (ref 32.0–48.0)
pH, Arterial: 7.412 (ref 7.350–7.450)
pO2, Arterial: 108 mmHg (ref 83.0–108.0)

## 2019-12-16 LAB — PROTIME-INR
INR: 1 (ref 0.8–1.2)
Prothrombin Time: 13.1 seconds (ref 11.4–15.2)

## 2019-12-16 LAB — CBC
HCT: 39.3 % (ref 36.0–46.0)
Hemoglobin: 12.4 g/dL (ref 12.0–15.0)
MCH: 30 pg (ref 26.0–34.0)
MCHC: 31.6 g/dL (ref 30.0–36.0)
MCV: 94.9 fL (ref 80.0–100.0)
Platelets: 258 10*3/uL (ref 150–400)
RBC: 4.14 MIL/uL (ref 3.87–5.11)
RDW: 14.2 % (ref 11.5–15.5)
WBC: 12.6 10*3/uL — ABNORMAL HIGH (ref 4.0–10.5)
nRBC: 0 % (ref 0.0–0.2)

## 2019-12-16 LAB — HEMOGLOBIN A1C
Hgb A1c MFr Bld: 6 % — ABNORMAL HIGH (ref 4.8–5.6)
Mean Plasma Glucose: 125.5 mg/dL

## 2019-12-16 LAB — SURGICAL PCR SCREEN
MRSA, PCR: NEGATIVE
Staphylococcus aureus: NEGATIVE

## 2019-12-16 LAB — GLUCOSE, CAPILLARY: Glucose-Capillary: 93 mg/dL (ref 70–99)

## 2019-12-16 LAB — APTT: aPTT: 30 seconds (ref 24–36)

## 2019-12-16 LAB — BRAIN NATRIURETIC PEPTIDE: B Natriuretic Peptide: 117.4 pg/mL — ABNORMAL HIGH (ref 0.0–100.0)

## 2019-12-16 MED ORDER — SULFAMETHOXAZOLE-TRIMETHOPRIM 800-160 MG PO TABS: 1.0000 | ORAL_TABLET | Freq: Two times a day (BID) | ORAL | 0 refills | Status: DC

## 2019-12-16 NOTE — Progress Notes (Signed)
Abnormal labs resulted. Left VM on nurse Lauren Brown's phone as well as [employer]'d.

## 2019-12-16 NOTE — Progress Notes (Signed)
PCP - Dr. [name] Cardiologist - Dr. Daneen Schick Pulmonologist - Dr. Kara Mead   PPM/ICD - denies  Chest x-ray - 12/16/2019 EKG - 12/16/2019 Stress Test - 2016 ECHO - 10/07/2019 Cardiac Cath - 08/08/2019  Sleep Study - denies CPAP - N/A  Fasting Blood Sugar - 101 - 120  Checks Blood Sugar "about every other day or so" per patient  Blood Thinner Instructions: N/A Aspirin Instructions: patient stated, told to continue taking as prescribed up until the day of surgery  ERAS Protcol - No  COVID TEST- Scheduled for 12/17/2019. Patient verbalized understanding of self-quarantine instructions, appointment time and place.  Anesthesia review: YES, cardiac hx, pulmonary hx  Patient denies shortness of breath, fever, cough and chest pain at PAT appointment  All instructions explained to the patient, with a verbal understanding of the material. Patient agrees to go over the instructions while at home for a better understanding. Patient also instructed to self quarantine after being tested for COVID-19. The opportunity to ask questions was provided.

## 2019-12-17 ENCOUNTER — Other Ambulatory Visit (HOSPITAL_COMMUNITY)
Admission: RE | Admit: 2019-12-17 | Discharge: 2019-12-17 | Disposition: A | Discharge status: Home / Self Care | Payer: Medicare Other | Source: Ambulatory Visit | Attending: Cardiovascular Disease | Admitting: Cardiovascular Disease

## 2019-12-17 DIAGNOSIS — Z01812 Encounter for preprocedural laboratory examination: Secondary | ICD-10-CM | POA: Insufficient documentation

## 2019-12-17 DIAGNOSIS — Z20822 Contact with and (suspected) exposure to covid-19: Secondary | ICD-10-CM | POA: Insufficient documentation

## 2019-12-17 LAB — SARS CORONAVIRUS 2 (TAT 6-24 HRS): SARS Coronavirus 2: NEGATIVE

## 2019-12-19 MED ORDER — SODIUM CHLORIDE 0.9 % IV SOLN
INTRAVENOUS | Status: DC
  Filled 2019-12-19: qty 30

## 2019-12-19 MED ORDER — MAGNESIUM SULFATE 50 % IJ SOLN
40.0000 meq | INTRAMUSCULAR | Status: DC
  Filled 2019-12-19: qty 9.85

## 2019-12-19 MED ORDER — NOREPINEPHRINE 4 MG/250ML-% IV SOLN
0.0000 ug/min | INTRAVENOUS | Status: AC
  Administered 2019-12-20: 1 ug/min via INTRAVENOUS
  Filled 2019-12-19: qty 250

## 2019-12-19 MED ORDER — VANCOMYCIN HCL 1250 MG/250ML IV SOLN
1250.0000 mg | INTRAVENOUS | Status: AC
  Administered 2019-12-20: 1250 mg via INTRAVENOUS
  Filled 2019-12-19 (×3): qty 250

## 2019-12-19 MED ORDER — LEVOFLOXACIN IN D5W 500 MG/100ML IV SOLN
500.0000 mg | INTRAVENOUS | Status: AC
  Administered 2019-12-20: 500 mg via INTRAVENOUS
  Filled 2019-12-19 (×2): qty 100

## 2019-12-19 MED ORDER — DEXMEDETOMIDINE HCL IN NACL 400 MCG/100ML IV SOLN
0.1000 ug/kg/h | INTRAVENOUS | Status: AC
  Administered 2019-12-20: 6 ug/kg/h via INTRAVENOUS
  Filled 2019-12-19: qty 100

## 2019-12-19 MED ORDER — POTASSIUM CHLORIDE 2 MEQ/ML IV SOLN
80.0000 meq | INTRAVENOUS | Status: DC
  Filled 2019-12-19: qty 40

## 2019-12-20 ENCOUNTER — Ambulatory Visit (HOSPITAL_COMMUNITY): Payer: Medicare Other

## 2019-12-20 ENCOUNTER — Inpatient Hospital Stay (HOSPITAL_COMMUNITY): Payer: Medicare Other | Admitting: Physician Assistant

## 2019-12-20 ENCOUNTER — Encounter (HOSPITAL_COMMUNITY): Payer: Self-pay | Admitting: Cardiovascular Disease

## 2019-12-20 ENCOUNTER — Other Ambulatory Visit: Payer: Self-pay

## 2019-12-20 ENCOUNTER — Inpatient Hospital Stay (HOSPITAL_COMMUNITY): Payer: Medicare Other | Admitting: Certified Registered"

## 2019-12-20 ENCOUNTER — Inpatient Hospital Stay (HOSPITAL_COMMUNITY)
Admission: RE | Admit: 2019-12-20 | Discharge: 2019-12-22 | DRG: 266 | Disposition: A | Discharge status: Home Health | Payer: Medicare Other | Attending: Thoracic Surgery (Cardiothoracic Vascular Surgery) | Admitting: Thoracic Surgery (Cardiothoracic Vascular Surgery)

## 2019-12-20 ENCOUNTER — Encounter (HOSPITAL_COMMUNITY)
Admission: RE | Disposition: A | Discharge status: Home Health | Payer: Self-pay | Source: Home / Self Care | Attending: Thoracic Surgery (Cardiothoracic Vascular Surgery)

## 2019-12-20 DIAGNOSIS — Z20822 Contact with and (suspected) exposure to covid-19: Secondary | ICD-10-CM | POA: Diagnosis present

## 2019-12-20 DIAGNOSIS — Z7982 Long term (current) use of aspirin: Secondary | ICD-10-CM

## 2019-12-20 DIAGNOSIS — Z006 Encounter for examination for normal comparison and control in clinical research program: Secondary | ICD-10-CM

## 2019-12-20 DIAGNOSIS — Z9109 Other allergy status, other than to drugs and biological substances: Secondary | ICD-10-CM

## 2019-12-20 DIAGNOSIS — E1136 Type 2 diabetes mellitus with diabetic cataract: Secondary | ICD-10-CM | POA: Diagnosis present

## 2019-12-20 DIAGNOSIS — Z809 Family history of malignant neoplasm, unspecified: Secondary | ICD-10-CM

## 2019-12-20 DIAGNOSIS — Z79899 Other long term (current) drug therapy: Secondary | ICD-10-CM

## 2019-12-20 DIAGNOSIS — D3501 Benign neoplasm of right adrenal gland: Secondary | ICD-10-CM | POA: Diagnosis present

## 2019-12-20 DIAGNOSIS — Z6824 Body mass index (BMI) 24.0-24.9, adult: Secondary | ICD-10-CM

## 2019-12-20 DIAGNOSIS — I700 Atherosclerosis of aorta: Secondary | ICD-10-CM | POA: Diagnosis present

## 2019-12-20 DIAGNOSIS — I252 Old myocardial infarction: Secondary | ICD-10-CM

## 2019-12-20 DIAGNOSIS — I679 Cerebrovascular disease, unspecified: Secondary | ICD-10-CM | POA: Diagnosis present

## 2019-12-20 DIAGNOSIS — I350 Nonrheumatic aortic (valve) stenosis: Secondary | ICD-10-CM

## 2019-12-20 DIAGNOSIS — I639 Cerebral infarction, unspecified: Secondary | ICD-10-CM | POA: Diagnosis not present

## 2019-12-20 DIAGNOSIS — R29702 NIHSS score 2: Secondary | ICD-10-CM | POA: Diagnosis not present

## 2019-12-20 DIAGNOSIS — Z8249 Family history of ischemic heart disease and other diseases of the circulatory system: Secondary | ICD-10-CM

## 2019-12-20 DIAGNOSIS — Z83438 Family history of other disorder of lipoprotein metabolism and other lipidemia: Secondary | ICD-10-CM | POA: Diagnosis not present

## 2019-12-20 DIAGNOSIS — K219 Gastro-esophageal reflux disease without esophagitis: Secondary | ICD-10-CM | POA: Diagnosis present

## 2019-12-20 DIAGNOSIS — J4540 Moderate persistent asthma, uncomplicated: Secondary | ICD-10-CM | POA: Diagnosis present

## 2019-12-20 DIAGNOSIS — K5730 Diverticulosis of large intestine without perforation or abscess without bleeding: Secondary | ICD-10-CM | POA: Diagnosis present

## 2019-12-20 DIAGNOSIS — Z01812 Encounter for preprocedural laboratory examination: Secondary | ICD-10-CM

## 2019-12-20 DIAGNOSIS — Z952 Presence of prosthetic heart valve: Secondary | ICD-10-CM

## 2019-12-20 DIAGNOSIS — I6340 Cerebral infarction due to embolism of unspecified cerebral artery: Secondary | ICD-10-CM | POA: Diagnosis not present

## 2019-12-20 DIAGNOSIS — K449 Diaphragmatic hernia without obstruction or gangrene: Secondary | ICD-10-CM | POA: Diagnosis present

## 2019-12-20 DIAGNOSIS — E7800 Pure hypercholesterolemia, unspecified: Secondary | ICD-10-CM | POA: Diagnosis not present

## 2019-12-20 DIAGNOSIS — Z7984 Long term (current) use of oral hypoglycemic drugs: Secondary | ICD-10-CM

## 2019-12-20 DIAGNOSIS — Z954 Presence of other heart-valve replacement: Secondary | ICD-10-CM | POA: Diagnosis not present

## 2019-12-20 DIAGNOSIS — I110 Hypertensive heart disease with heart failure: Secondary | ICD-10-CM | POA: Diagnosis present

## 2019-12-20 DIAGNOSIS — Z955 Presence of coronary angioplasty implant and graft: Secondary | ICD-10-CM

## 2019-12-20 DIAGNOSIS — E669 Obesity, unspecified: Secondary | ICD-10-CM | POA: Diagnosis present

## 2019-12-20 DIAGNOSIS — E119 Type 2 diabetes mellitus without complications: Secondary | ICD-10-CM | POA: Diagnosis not present

## 2019-12-20 DIAGNOSIS — I6310 Cerebral infarction due to embolism of unspecified precerebral artery: Secondary | ICD-10-CM

## 2019-12-20 DIAGNOSIS — I479 Paroxysmal tachycardia, unspecified: Secondary | ICD-10-CM | POA: Diagnosis not present

## 2019-12-20 DIAGNOSIS — I63431 Cerebral infarction due to embolism of right posterior cerebral artery: Secondary | ICD-10-CM | POA: Diagnosis not present

## 2019-12-20 DIAGNOSIS — I2510 Atherosclerotic heart disease of native coronary artery without angina pectoris: Secondary | ICD-10-CM | POA: Diagnosis present

## 2019-12-20 DIAGNOSIS — I471 Supraventricular tachycardia: Secondary | ICD-10-CM | POA: Diagnosis not present

## 2019-12-20 DIAGNOSIS — I10 Essential (primary) hypertension: Secondary | ICD-10-CM | POA: Diagnosis not present

## 2019-12-20 DIAGNOSIS — E785 Hyperlipidemia, unspecified: Secondary | ICD-10-CM | POA: Diagnosis present

## 2019-12-20 DIAGNOSIS — I5033 Acute on chronic diastolic (congestive) heart failure: Secondary | ICD-10-CM | POA: Diagnosis present

## 2019-12-20 DIAGNOSIS — I97820 Postprocedural cerebrovascular infarction during cardiac surgery: Secondary | ICD-10-CM | POA: Diagnosis not present

## 2019-12-20 DIAGNOSIS — Y838 Other surgical procedures as the cause of abnormal reaction of the patient, or of later complication, without mention of misadventure at the time of the procedure: Secondary | ICD-10-CM | POA: Diagnosis not present

## 2019-12-20 DIAGNOSIS — Z885 Allergy status to narcotic agent status: Secondary | ICD-10-CM

## 2019-12-20 DIAGNOSIS — Z881 Allergy status to other antibiotic agents status: Secondary | ICD-10-CM

## 2019-12-20 DIAGNOSIS — Z7951 Long term (current) use of inhaled steroids: Secondary | ICD-10-CM

## 2019-12-20 DIAGNOSIS — Z87891 Personal history of nicotine dependence: Secondary | ICD-10-CM

## 2019-12-20 HISTORY — PX: TEE WITHOUT CARDIOVERSION: SHX5443

## 2019-12-20 HISTORY — PX: TRANSCATHETER AORTIC VALVE REPLACEMENT, TRANSFEMORAL: SHX6400

## 2019-12-20 HISTORY — DX: Presence of prosthetic heart valve: Z95.2

## 2019-12-20 LAB — POCT I-STAT, CHEM 8
BUN: 15 mg/dL (ref 8–23)
Calcium, Ion: 1.19 mmol/L (ref 1.15–1.40)
Chloride: 107 mmol/L (ref 98–111)
Creatinine, Ser: 0.8 mg/dL (ref 0.44–1.00)
Glucose, Bld: 111 mg/dL — ABNORMAL HIGH (ref 70–99)
HCT: 29 % — ABNORMAL LOW (ref 36.0–46.0)
Hemoglobin: 9.9 g/dL — ABNORMAL LOW (ref 12.0–15.0)
Potassium: 4 mmol/L (ref 3.5–5.1)
Sodium: 140 mmol/L (ref 135–145)
TCO2: 21 mmol/L — ABNORMAL LOW (ref 22–32)

## 2019-12-20 LAB — ECHOCARDIOGRAM LIMITED
AR max vel: 2.55 cm2
AV Area VTI: 2.43 cm2
AV Area mean vel: 1.66 cm2
AV Mean grad: 6 mmHg
AV Peak grad: 10.9 mmHg
Ao pk vel: 1.65 m/s

## 2019-12-20 LAB — GLUCOSE, CAPILLARY
Glucose-Capillary: 125 mg/dL — ABNORMAL HIGH (ref 70–99)
Glucose-Capillary: 106 mg/dL — ABNORMAL HIGH (ref 70–99)
Glucose-Capillary: 154 mg/dL — ABNORMAL HIGH (ref 70–99)

## 2019-12-20 LAB — POCT ACTIVATED CLOTTING TIME
Activated Clotting Time: 136 seconds
Activated Clotting Time: 263 seconds

## 2019-12-20 SURGERY — IMPLANTATION, AORTIC VALVE, TRANSCATHETER, FEMORAL APPROACH
Anesthesia: Monitor Anesthesia Care | Site: Chest

## 2019-12-20 MED ORDER — TRAMADOL HCL 50 MG PO TABS
50.0000 mg | ORAL_TABLET | ORAL | Status: DC | PRN
  Filled 2019-12-20: qty 1

## 2019-12-20 MED ORDER — CHLORHEXIDINE GLUCONATE 0.12 % MT SOLN
15.0000 mL | Freq: Once | OROMUCOSAL | Status: AC
  Administered 2019-12-20: 15 mL via OROMUCOSAL
  Filled 2019-12-20 (×2): qty 15

## 2019-12-20 MED ORDER — PHENYLEPHRINE HCL-NACL 20-0.9 MG/250ML-% IV SOLN
0.0000 ug/min | INTRAVENOUS | Status: DC
  Filled 2019-12-20: qty 250

## 2019-12-20 MED ORDER — ONDANSETRON HCL 4 MG/2ML IJ SOLN
INTRAMUSCULAR | Status: DC | PRN
  Administered 2019-12-20: 4 mg via INTRAVENOUS

## 2019-12-20 MED ORDER — LACTATED RINGERS IV SOLN: INTRAVENOUS | Status: DC

## 2019-12-20 MED ORDER — ACETAMINOPHEN 325 MG PO TABS
650.0000 mg | ORAL_TABLET | Freq: Four times a day (QID) | ORAL | Status: DC | PRN
  Administered 2019-12-20 – 2019-12-21 (×3): 650 mg via ORAL
  Filled 2019-12-20 (×2): qty 2

## 2019-12-20 MED ORDER — HEPARIN (PORCINE) IN NACL 1000-0.9 UT/500ML-% IV SOLN
INTRAVENOUS | Status: AC
  Filled 2019-12-20: qty 1500

## 2019-12-20 MED ORDER — HEPARIN (PORCINE) IN NACL 1000-0.9 UT/500ML-% IV SOLN
INTRAVENOUS | Status: DC | PRN
  Administered 2019-12-20 (×2): 500 mL

## 2019-12-20 MED ORDER — ONDANSETRON HCL 4 MG/2ML IJ SOLN
4.0000 mg | Freq: Four times a day (QID) | INTRAMUSCULAR | Status: DC | PRN
  Administered 2019-12-21 (×2): 4 mg via INTRAVENOUS
  Filled 2019-12-20 (×2): qty 2

## 2019-12-20 MED ORDER — LEVOFLOXACIN IN D5W 750 MG/150ML IV SOLN
750.0000 mg | INTRAVENOUS | Status: DC
  Filled 2019-12-20: qty 150

## 2019-12-20 MED ORDER — CLOPIDOGREL BISULFATE 75 MG PO TABS
75.0000 mg | ORAL_TABLET | Freq: Every day | ORAL | Status: DC
  Administered 2019-12-21 – 2019-12-22 (×2): 75 mg via ORAL
  Filled 2019-12-20 (×2): qty 1

## 2019-12-20 MED ORDER — NITROGLYCERIN IN D5W 200-5 MCG/ML-% IV SOLN: 0.0000 ug/min | INTRAVENOUS | Status: DC

## 2019-12-20 MED ORDER — INSULIN ASPART 100 UNIT/ML ~~LOC~~ SOLN
0.0000 [IU] | Freq: Three times a day (TID) | SUBCUTANEOUS | Status: DC
  Administered 2019-12-20 – 2019-12-21 (×2): 2 [IU] via SUBCUTANEOUS

## 2019-12-20 MED ORDER — CHLORHEXIDINE GLUCONATE 4 % EX LIQD: 30.0000 mL | CUTANEOUS | Status: DC

## 2019-12-20 MED ORDER — IOHEXOL 350 MG/ML SOLN
INTRAVENOUS | Status: DC | PRN
  Administered 2019-12-20: 60 mL

## 2019-12-20 MED ORDER — MOMETASONE FURO-FORMOTEROL FUM 100-5 MCG/ACT IN AERO
2.0000 | INHALATION_SPRAY | Freq: Two times a day (BID) | RESPIRATORY_TRACT | Status: DC
  Administered 2019-12-20 – 2019-12-22 (×4): 2 via RESPIRATORY_TRACT
  Filled 2019-12-20 (×2): qty 8.8

## 2019-12-20 MED ORDER — TEMAZEPAM 15 MG PO CAPS
15.0000 mg | ORAL_CAPSULE | Freq: Every evening | ORAL | Status: DC | PRN
  Administered 2019-12-20 – 2019-12-21 (×2): 15 mg via ORAL
  Filled 2019-12-20 (×2): qty 1

## 2019-12-20 MED ORDER — LORATADINE 10 MG PO TABS
10.0000 mg | ORAL_TABLET | Freq: Every day | ORAL | Status: DC
  Administered 2019-12-20 – 2019-12-22 (×3): 10 mg via ORAL
  Filled 2019-12-20 (×3): qty 1

## 2019-12-20 MED ORDER — SODIUM CHLORIDE 0.9 % IV SOLN: 250.0000 mL | INTRAVENOUS | Status: DC | PRN

## 2019-12-20 MED ORDER — PROTAMINE SULFATE 10 MG/ML IV SOLN
INTRAVENOUS | Status: DC | PRN
  Administered 2019-12-20: 100 mg via INTRAVENOUS

## 2019-12-20 MED ORDER — MORPHINE SULFATE (PF) 2 MG/ML IV SOLN: 1.0000 mg | INTRAVENOUS | Status: DC | PRN

## 2019-12-20 MED ORDER — SODIUM CHLORIDE 0.9 % IV SOLN: INTRAVENOUS | Status: AC

## 2019-12-20 MED ORDER — OXYCODONE HCL 5 MG PO TABS
5.0000 mg | ORAL_TABLET | ORAL | Status: DC | PRN
  Administered 2019-12-20 – 2019-12-21 (×3): 5 mg via ORAL
  Filled 2019-12-20 (×3): qty 1

## 2019-12-20 MED ORDER — IOHEXOL 350 MG/ML SOLN
INTRAVENOUS | Status: AC
  Filled 2019-12-20: qty 1

## 2019-12-20 MED ORDER — LIDOCAINE HCL 1 % IJ SOLN
INTRAMUSCULAR | Status: AC
  Filled 2019-12-20: qty 20

## 2019-12-20 MED ORDER — SODIUM CHLORIDE 0.9% FLUSH
3.0000 mL | Freq: Two times a day (BID) | INTRAVENOUS | Status: DC
  Administered 2019-12-20 – 2019-12-21 (×3): 3 mL via INTRAVENOUS

## 2019-12-20 MED ORDER — FENTANYL CITRATE (PF) 250 MCG/5ML IJ SOLN
INTRAMUSCULAR | Status: DC | PRN
Start: 2019-12-20 — End: 2019-12-20
  Administered 2019-12-20: 50 ug via INTRAVENOUS

## 2019-12-20 MED ORDER — LIDOCAINE 2% (20 MG/ML) 5 ML SYRINGE
INTRAMUSCULAR | Status: DC | PRN
  Administered 2019-12-20 (×2): 50 mg via INTRAVENOUS

## 2019-12-20 MED ORDER — MIDAZOLAM HCL 2 MG/2ML IJ SOLN
INTRAMUSCULAR | Status: DC | PRN
  Administered 2019-12-20: 1 mg via INTRAVENOUS

## 2019-12-20 MED ORDER — LIDOCAINE HCL (PF) 1 % IJ SOLN
INTRAMUSCULAR | Status: DC | PRN
  Administered 2019-12-20 (×2): 10 mL via INTRADERMAL

## 2019-12-20 MED ORDER — HEPARIN (PORCINE) IN NACL 1000-0.9 UT/500ML-% IV SOLN
INTRAVENOUS | Status: DC | PRN
  Administered 2019-12-20: 500 mL

## 2019-12-20 MED ORDER — HEPARIN SODIUM (PORCINE) 1000 UNIT/ML IJ SOLN
INTRAMUSCULAR | Status: DC | PRN
  Administered 2019-12-20: 10000 [IU] via INTRAVENOUS

## 2019-12-20 MED ORDER — SODIUM CHLORIDE 0.9% FLUSH: 3.0000 mL | INTRAVENOUS | Status: DC | PRN

## 2019-12-20 MED ORDER — PROPOFOL 500 MG/50ML IV EMUL
INTRAVENOUS | Status: DC | PRN
  Administered 2019-12-20: 25 ug/kg/min via INTRAVENOUS

## 2019-12-20 MED ORDER — ACETAMINOPHEN 650 MG RE SUPP: 650.0000 mg | Freq: Four times a day (QID) | RECTAL | Status: DC | PRN

## 2019-12-20 MED ORDER — ACETAMINOPHEN 325 MG PO TABS
ORAL_TABLET | ORAL | Status: AC
  Filled 2019-12-20: qty 2

## 2019-12-20 MED ORDER — CHLORHEXIDINE GLUCONATE 4 % EX LIQD: 60.0000 mL | Freq: Once | CUTANEOUS | Status: DC

## 2019-12-20 MED ORDER — SODIUM CHLORIDE 0.9 % IV SOLN: INTRAVENOUS | Status: DC

## 2019-12-20 MED ORDER — ASPIRIN EC 81 MG PO TBEC
162.0000 mg | DELAYED_RELEASE_TABLET | Freq: Every evening | ORAL | Status: DC
  Administered 2019-12-20: 162 mg via ORAL
  Filled 2019-12-20: qty 2

## 2019-12-20 MED ORDER — VANCOMYCIN HCL IN DEXTROSE 1-5 GM/200ML-% IV SOLN
1000.0000 mg | Freq: Once | INTRAVENOUS | Status: AC
  Administered 2019-12-20: 1000 mg via INTRAVENOUS
  Filled 2019-12-20: qty 200

## 2019-12-20 SURGICAL SUPPLY — 38 items
BAG SNAP BAND KOVER 36X36 (MISCELLANEOUS) ×6 IMPLANT
BLANKET WARM UNDERBOD FULL ACC (MISCELLANEOUS) ×3 IMPLANT
CABLE ADAPT PACING TEMP 12FT (ADAPTER) ×1 IMPLANT
CATH DIAG 6FR PIGTAIL ANGLED (CATHETERS) ×1 IMPLANT
CATH INFINITI 5 FR STR PIGTAIL (CATHETERS) ×1 IMPLANT
CATH INFINITI 6F AL1 (CATHETERS) ×1 IMPLANT
CATH S G BIP PACING (CATHETERS) ×1 IMPLANT
CLOSURE MYNX CONTROL 6F/7F (Vascular Products) ×1 IMPLANT
DEVICE CLOSURE PERCLS PRGLD 6F (VASCULAR PRODUCTS) IMPLANT
DRYSEAL FLEXSHEATH 18FR 33CM (SHEATH) ×1
ELECT DEFIB PAD ADLT CADENCE (PAD) ×1 IMPLANT
GUIDEWIRE CNFDA BRKR CVD (WIRE) ×1 IMPLANT
GUIDEWIRE SAFE TJ AMPLATZ EXST (WIRE) ×1 IMPLANT
KIT DILATOR VASC 18G NDL (KITS) ×1 IMPLANT
KIT HEART LEFT (KITS) ×3 IMPLANT
KIT MICROPUNCTURE NIT STIFF (SHEATH) ×1 IMPLANT
PACK CARDIAC CATHETERIZATION (CUSTOM PROCEDURE TRAY) ×3 IMPLANT
PERCLOSE PROGLIDE 6F (VASCULAR PRODUCTS) ×6
SHEATH BRITE TIP 7FR 35CM (SHEATH) ×1 IMPLANT
SHEATH DRYSEAL FLEX 18FR 33CM (SHEATH) IMPLANT
SHEATH PINNACLE 6F 10CM (SHEATH) ×1 IMPLANT
SHEATH PINNACLE 8F 10CM (SHEATH) ×1 IMPLANT
SHEATH PROBE COVER 6X72 (BAG) ×1 IMPLANT
SHIELD RADPAD SCOOP 12X17 (MISCELLANEOUS) ×1 IMPLANT
SLEEVE REPOSITIONING LENGTH 30 (MISCELLANEOUS) ×1 IMPLANT
STOPCOCK MORSE 400PSI 3WAY (MISCELLANEOUS) ×6 IMPLANT
SYR MEDRAD MARK V 150ML (SYRINGE) ×1 IMPLANT
SYS DEL EVOLUT PROPLS 23 26 29 (CATHETERS) ×3
SYS LOAD EVOLT PROPLS 23 26 29 (CATHETERS) ×3
SYSTEM DEL EVLT PRPLS 23 26 29 (CATHETERS) IMPLANT
SYSTEM LOAD EVLT PRPLS23 26 29 (CATHETERS) IMPLANT
TRANSDUCER W/STOPCOCK (MISCELLANEOUS) ×6 IMPLANT
TUBE CONN 8.8X1320 FR HP M-F (CONNECTOR) ×1 IMPLANT
VALVE AORTIC EVOLUT PROPLUS 26 (Valve) ×1 IMPLANT
WIRE AMPLATZ SS-J .035X180CM (WIRE) ×1 IMPLANT
WIRE EMERALD 3MM-J .035X150CM (WIRE) ×1 IMPLANT
WIRE EMERALD 3MM-J .035X260CM (WIRE) ×1 IMPLANT
WIRE EMERALD ST .035X260CM (WIRE) ×1 IMPLANT

## 2019-12-20 NOTE — Progress Notes (Signed)
  [place] VALVE TEAM  Patient doing well s/p TAVR. She is hemodynamically stable- after 1 L of IVF hydration. Groin sites stable. ECG with sinus and LAFB but no high grade block. Arterial line discontinued and transferred to 4E. Plan for early ambulation after bedrest completed and hopeful discharge over the next 24-48 hours.   Angelena Form PA-C  [facility]  Pager [phone]

## 2019-12-20 NOTE — Discharge Instructions (Signed)
ACTIVITY AND EXERCISE °• Daily activity and exercise are an important part of your recovery. People recover at different rates depending on their general health and type of valve procedure. °• Most people recovering from TAVR feel better relatively quickly  °• No lifting, pushing, pulling more than 10 pounds (examples to avoid: groceries, vacuuming, gardening, golfing): °            - For one week with a procedure through the groin. °            - For six weeks for procedures through the chest wall or neck. °NOTE: You will typically see one of our providers 7-14 days after your procedure to discuss WHEN TO RESUME the above activities.  °  °  °DRIVING °• Do not drive until you are seen for follow up and cleared by a provider. Generally, we ask patient to not drive for 1 week after their procedure. °• If you have been told by your doctor in the past that you may not drive, you must talk with him/her before you begin driving again. °  °DRESSING °• Groin site: you may leave the clear dressing over the site for up to one week or until it falls off. °  °HYGIENE °• If you had a femoral (leg) procedure, you may take a shower when you return home. After the shower, pat the site dry. Do NOT use powder, oils or lotions in your groin area until the site has completely healed. °• If you had a chest procedure, you may shower when you return home unless specifically instructed not to by your discharging practitioner. °            - DO NOT scrub incision; pat dry with a towel. °            - DO NOT apply any lotions, oils, powders to the incision. °            - No tub baths / swimming for at least 2 weeks. °• If you notice any fevers, chills, increased pain, swelling, bleeding or pus, please contact your doctor. °  °ADDITIONAL INFORMATION °• If you are going to have an upcoming dental procedure, please contact our office as you will require antibiotics ahead of time to prevent infection on your heart valve.  ° ° °If you have any  questions or concerns you can call the structural heart phone during normal business hours 8am-4pm. If you have an urgent need after hours or weekends please call [phone] to talk to the on call provider for general cardiology. If you have an emergency that requires immediate attention, please call 911.  ° ° °After TAVR Checklist ° °Check  Test Description  ° Follow up appointment in 1-2 weeks  You will see our structural heart physician assistant, Katie Lyfe Reihl. Your incision sites will be checked and you will be cleared to drive and resume all normal activities if you are doing well.    ° 1 month echo and follow up  You will have an echo to check on your new heart valve and be seen back in the office by Katie Anthonee Gelin. Many times the echo is not read by your appointment time, but Katie will call you later that day or the following day to report your results.  ° Follow up with your primary cardiologist You will need to be seen by your primary cardiologist in the following 3-6 months after your 1 month appointment in the valve   clinic. Often times your Plavix or Aspirin will be discontinued during this time, but this is decided on a case by case basis.   ° 1 year echo and follow up You will have another echo to check on your heart valve after 1 year and be seen back in the office by Katie Ji Feldner. This your last structural heart visit.  ° Bacterial endocarditis prophylaxis  You will have to take antibiotics for the rest of your life before all dental procedures (even teeth cleanings) to protect your heart valve. Antibiotics are also required before some surgeries. Please check with your cardiologist before scheduling any surgeries. Also, please make sure to tell us if you have a penicillin allergy as you will require an alternative antibiotic.   ° ° °

## 2019-12-20 NOTE — Progress Notes (Signed)
Katie, PA at the bedside. VORB for another 523ml NS bolus.

## 2019-12-20 NOTE — Transfer of Care (Signed)
Immediate Anesthesia Transfer of Care Note  Patient: Cheryl Hernandez  Procedure(s) Performed: TRANSCATHETER AORTIC VALVE REPLACEMENT, TRANSFEMORAL (N/A Chest) TRANSESOPHAGEAL ECHOCARDIOGRAM (TEE) (N/A )  Patient Location: PACU  Anesthesia Type:MAC  Level of Consciousness: drowsy and patient cooperative  Airway & Oxygen Therapy: Patient Spontanous Breathing and Patient connected to nasal cannula oxygen  Post-op Assessment: Report given to RN, Post -op Vital signs reviewed and stable and Patient moving all extremities  Post vital signs: Reviewed and stable  Last Vitals:  Vitals Value Taken Time  BP    Temp    Pulse    Resp    SpO2      Last Pain:  Vitals:   12/20/19 0753  TempSrc:   PainSc: 0-No pain      Patients Stated Pain Goal: 2 (57/01/77 [id number])  Complications: No complications documented.

## 2019-12-20 NOTE — Progress Notes (Signed)
Right radial arterial line removed per protocol before transfer to 4E.

## 2019-12-20 NOTE — Interval H&P Note (Signed)
History and Physical Interval Note:  12/20/2019 9:44 AM  Cheryl Hernandez  has presented today for surgery, with the diagnosis of Severe Aortic Stenosis.  The various methods of treatment have been discussed with the patient and family. After consideration of risks, benefits and other options for treatment, the patient has consented to  Procedure(s): TRANSCATHETER AORTIC VALVE REPLACEMENT, TRANSFEMORAL (N/A) TRANSESOPHAGEAL ECHOCARDIOGRAM (TEE) (N/A) as a surgical intervention.  The patient's history has been reviewed, patient examined, no change in status, stable for surgery.  I have reviewed the patient's chart and labs.  Questions were answered to the patient's satisfaction.     [name]

## 2019-12-20 NOTE — Progress Notes (Signed)
Pt arrived to 4e from the cath lab. Pt oriented to room and staff. Telemetry box applied and CCMD notified x2 verifiers. Pt currently has 551mL NS bolus infusing, per PA order. BP stable. Bilateral groin sites level 0. Family member at bedside. Menu provided to order lunch.

## 2019-12-20 NOTE — Op Note (Signed)
[facility]   MULTIDISCIPLINARY HEART VALVE TEAM   TAVR OPERATIVE NOTE   Date of Procedure:  12/20/2019  Preoperative Diagnosis: Severe Aortic Stenosis   Postoperative Diagnosis: Same   Procedure:    Transcatheter Aortic Valve Replacement - Percutaneous Transfemoral Approach  Medtronic Evolut Pro-Plus (size 26 mm, serial # [id number])   Co-Surgeons:  Valentina Gu. Roxy Manns, MD and Sherren Mocha, MD  Anesthesiologist:  Belinda Block, MD  Echocardiographer:  Sanda Klein, MD  Pre-operative Echo Findings:  Severe aortic stenosis  Normal left ventricular systolic function  Post-operative Echo Findings:  No paravalvular leak  Normal left ventricular systolic function  BRIEF CLINICAL NOTE AND INDICATIONS FOR SURGERY  79 year old woman with type 2 diabetes, hypertension, coronary artery disease status post remote MI in 2009, who has developed severe symptomatic aortic stenosis.  She presents today for TAVR after undergoing extensive multidisciplinary evaluation of her case.  During the course of the patient's preoperative work up they have been evaluated comprehensively by a multidisciplinary team of specialists coordinated through the [facility] in the [facility].  They have been demonstrated to suffer from symptomatic severe aortic stenosis as noted above. The patient has been counseled extensively as to the relative risks and benefits of all options for the treatment of severe aortic stenosis including long term medical therapy, conventional surgery for aortic valve replacement, and transcatheter aortic valve replacement.  The patient has been independently evaluated in formal cardiac surgical consultation by Dr Roxy Manns, who deemed the patient appropriate for TAVR. Based upon review of all of the patient's preoperative diagnostic tests they are felt to be candidate for transcatheter aortic valve replacement using the  transfemoral approach as an alternative to conventional surgery.    Following the decision to proceed with transcatheter aortic valve replacement, a discussion has been held regarding what types of management strategies would be attempted intraoperatively in the event of life-threatening complications, including whether or not the patient would be considered a candidate for the use of cardiopulmonary bypass and/or conversion to open sternotomy for attempted surgical intervention.  The patient has been advised of a variety of complications that might develop peculiar to this approach including but not limited to risks of death, stroke, paravalvular leak, aortic dissection or other major vascular complications, aortic annulus rupture, device embolization, cardiac rupture or perforation, acute myocardial infarction, arrhythmia, heart block or bradycardia requiring permanent pacemaker placement, congestive heart failure, respiratory failure, renal failure, pneumonia, infection, other late complications related to structural valve deterioration or migration, or other complications that might ultimately cause a temporary or permanent loss of functional independence or other long term morbidity.  The patient provides full informed consent for the procedure as described and all questions were answered preoperatively.  DETAILS OF THE OPERATIVE PROCEDURE  PREPARATION:   The patient is brought to the operating room on the above mentioned date and central monitoring was established by the anesthesia team including placement of a central venous catheter and radial arterial line. The patient is placed in the supine position on the operating table.  Intravenous antibiotics are administered. The patient is monitored closely throughout the procedure under conscious sedation.  Baseline transthoracic echocardiogram is performed. The patient's chest, abdomen, both groins, and both lower extremities are prepared and draped in a  sterile manner. A time out procedure is performed.   PERIPHERAL ACCESS:   Using ultrasound guidance, femoral arterial and venous access is obtained with placement of 6 Fr sheaths on the  left side.  A pigtail diagnostic catheter was passed through the femoral arterial sheath under fluoroscopic guidance into the aortic root.  A temporary transvenous pacemaker catheter was passed through the femoral venous sheath under fluoroscopic guidance into the right ventricle.  The pacemaker was tested to ensure stable lead placement and pacemaker capture.   TRANSFEMORAL ACCESS:  A micropuncture technique is used to access the right femoral artery under fluoroscopic and ultrasound guidance.  2 Perclose devices are deployed at 10' and 2' positions to 'PreClose' the femoral artery. An 8 French sheath is placed.  An AL-1 catheter was used to direct a straight-tip wire across the aortic valve and this is changed out for a pigtail catheter in the LV apex were simultaneous pressures were recorded.  A Confida wire is then positioned in the apex and the pigtail catheter is removed.    BALLOON AORTIC VALVULOPLASTY:  Not performed  TRANSCATHETER HEART VALVE DEPLOYMENT:  Please see the complete note of Dr. Roxy Manns for details.  A 26 mm evolut pro plus valve is deployed successfully with stable positioning, normal gradients, and no significant paravalvular or intravalvular leak.   PROCEDURE COMPLETION:  The sheath was removed and femoral artery closure is performed using the 2 previously deployed Perclose devices.  Protamine is administered once femoral arterial repair was complete. The site is clear with no evidence of bleeding or hematoma after the sutures are tightened. The temporary pacemaker and pigtail catheters are removed. Mynx closure is used for contralateral femoral arterial hemostasis for the 6 Fr sheath.  The patient tolerated the procedure well and is transported to the surgical intensive care in stable  condition. There were no immediate intraoperative complications. All sponge instrument and needle counts are verified correct at completion of the operation.   Sherren Mocha, MD 12/20/2019 11:36 AM

## 2019-12-20 NOTE — Progress Notes (Signed)
Mobility Specialist: Progress Note   12/20/19 1624  Mobility  Activity Ambulated in hall  Level of [place] wheel walker  Distance Ambulated (ft) 400 ft  Mobility Response Tolerated well  Mobility performed by Mobility specialist  Bed Position High-fowlers  $Mobility charge 1 Mobility   Pre-Mobility:   Sitting: 80 HR, 82/41 BP, 97% SpO2  Standing: 102/62  Post-Mobility: 87 HR, 131/60 BP, 97% SpO2  Pt had no c/o of pain, dizziness, or SOB during ambulation. Pt says she does not normally use a RW and wants to try walking w/o using assistive device next time.   [facility] Johnsie Moscoso Mobility Specialist

## 2019-12-20 NOTE — Anesthesia Postprocedure Evaluation (Signed)
Anesthesia Post Note  Patient: Cheryl Hernandez  Procedure(s) Performed: TRANSCATHETER AORTIC VALVE REPLACEMENT, TRANSFEMORAL (N/A Chest) TRANSESOPHAGEAL ECHOCARDIOGRAM (TEE) (N/A )     Patient location during evaluation: PACU Anesthesia Type: MAC Level of consciousness: awake Pain management: pain level controlled Vital Signs Assessment: post-procedure vital signs reviewed and stable Respiratory status: spontaneous breathing Cardiovascular status: stable Postop Assessment: no apparent nausea or vomiting Anesthetic complications: no   No complications documented.  Last Vitals:  Vitals:   12/20/19 1430 12/20/19 1500  BP: (!) 100/53 (!) 102/49  Pulse: 74 68  Resp:    Temp:    SpO2: 98% 97%    Last Pain:  Vitals:   12/20/19 1235  TempSrc: Temporal  PainSc: 5                  Ezriel Boffa

## 2019-12-20 NOTE — Op Note (Signed)
[facility]   MULTIDISCIPLINARY HEART VALVE TEAM   TAVR OPERATIVE NOTE   Date of Procedure:  12/20/2019  Preoperative Diagnosis: Severe Aortic Stenosis   Postoperative Diagnosis: Same   Procedure:    Transcatheter Aortic Valve Replacement - Percutaneous Right Transfemoral Approach  Medtronic CoreValve Evolut Pro (size 26 mm, serial # [id number])   Co-Surgeons:  Valentina Gu. 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  Patient states that she has known of presence of a heart murmur for many years.  In 2009 she suffered an acute non-ST segment elevation myocardial infarction for which she was treated with PCI and stenting of the right coronary artery.  She has been followed carefully ever since by Dr. [name].  She states that over the past 2 to 3 years she has developed progressive exertional shortness of breath and fatigue.  Echocardiograms have documented the presence of aortic stenosis that has gradually increased in severity.  Diagnostic cardiac catheterization was performed by Dr. [name] on Aug 08, 2019.  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  Catheterization also confirmed the presence of aortic stenosis with peak to peak and mean transvalvular gradients measured 33 and 29 mmHg respectively, corresponding to aortic valve area calculated only 0.81 cm.  Right-sided pressures were normal.  The patient was referred to the multidisciplinary heart valve clinic and has previously been seen in consultation by Dr. Burt Knack on September 21, 2019.  Follow-up transthoracic echocardiogram was performed October 07, 2019 revealing findings consistent with normal left ventricular systolic function but paradoxical severe low-flow low gradient aortic stenosis.  CT angiography was performed and the patient was referred for surgical consultation.  During the course of the patient's preoperative work up they have been evaluated comprehensively by a multidisciplinary team of specialists coordinated through the [facility] in the [facility].  They have been demonstrated to suffer from symptomatic severe aortic stenosis as noted above. The patient has been counseled extensively as to the relative risks and benefits of all options for the treatment of severe aortic stenosis including long term medical therapy, conventional surgery for aortic valve replacement, and transcatheter aortic valve replacement.  All questions have been answered, and the patient provides full informed consent for the operation as described.   DETAILS OF THE OPERATIVE PROCEDURE  PREPARATION:    The patient is brought to the operating room on the above mentioned date and central monitoring was established by the anesthesia team including placement of a central venous line and radial arterial line. The patient is placed in the supine  position on the operating table.  Intravenous antibiotics are administered. The patient is monitored  closely throughout the procedure under conscious sedation.  Baseline transthoracic echocardiogram was performed. The patient's chest, abdomen, both groins, and both lower extremities are prepared and draped in a sterile manner. A time out procedure is performed.   PERIPHERAL ACCESS:    Using the modified Seldinger technique, femoral arterial and venous access was obtained with placement of 6 Fr sheaths on the left side.  A pigtail diagnostic catheter was passed through the left arterial sheath under fluoroscopic guidance into the aortic root.  The pigtail catheter is positioned in the non-coronary sinus of Valsalva.  A temporary transvenous pacemaker catheter was passed through the left femoral venous sheath under fluoroscopic guidance into the right ventricle.  The pacemaker was tested to ensure stable lead placement and pacemaker capture. Aortic root angiography was performed in order to determine the optimal angiographic angle for valve deployment.   TRANSFEMORAL ACCESS:   Percutaneous transfemoral access and sheath placement was performed by Dr. Burt Knack using ultrasound guidance.  The right common femoral artery was cannulated using a micropuncture needle and appropriate location was verified using hand injection angiogram.  A pair of Abbott Perclose percutaneous closure devices were placed and a 6 French sheath replaced into the femoral artery.  The patient was heparinized systemically and ACT verified > 250 seconds.    An AL-1 catheter was used to direct a straight-tip exchange length wire across the native aortic valve into the left ventricle. This was exchanged out for a pigtail catheter and position was confirmed in the LV apex. The pigtail catheter was exchanged for Confida wire in the LV apex.     TRANSCATHETER HEART VALVE DEPLOYMENT:   A Medtronic CoreValve Evolut Pro transcatheter heart  valve (size26 mm, serial #[id number]) was prepared and crimped per manufacturer's guidelines, and the proper loading of the valve is confirmed on the [facility] Pro delivery system using flouroscopy. The right femoral artery was progressively dilated over the current fetal wire.  The valve and delivery system were advanced over the guidewire, through the iliac arteries and aorta, and advanced across the aortic arch using flouroscopy. The valve was carefully positioned across the aortic valve annulus. Once appropriate position of the valve has been confirmed by angiographic assessment, the valve is deployed gradually to 80%, at which time a second aortogram was performed to confirm the appropriate depth and position of deployment.  After initial deployment valve had moved cephalad with unsatisfactory position.  The valve was recaptured and repositioned and again deployed to 80% using rapid ventricular pacing.  Once final position was confirmed, deployment was completed, the valve released, and the delivery system carefully removed from the aortic root. Valve function is assessed using echocardiography. There is felt to be no paravalvular leak and no central aortic insufficiency.  The patient's hemodynamic recovery following valve deployment is rapid and uneventful.     PROCEDURE COMPLETION:   The deployment system is and guidewire were removed and femoral artery closure performed by securing the Perclose sutures.  Protamine was administered once femoral arterial repair was complete. The temporary pacemaker was removed.  The pigtail catheters and femoral sheaths were removed with manual pressure used for hemostasis.   The patient tolerated the procedure well and is transported to the surgical intensive care in stable condition. There were no immediate intraoperative complications. All sponge instrument and needle counts are verified correct at completion of the operation.   No blood products were administered during  the operation.     [name], MD 12/20/2019 11:23 AM

## 2019-12-20 NOTE — Anesthesia Preprocedure Evaluation (Addendum)
Anesthesia Evaluation  Patient identified by MRN, date of birth, ID band Patient awake    Reviewed: Allergy & Precautions, NPO status , Patient's Chart, lab work & pertinent test results  Airway Mallampati: II  TM Distance: >3 FB     Dental   Pulmonary shortness of breath, asthma , former smoker,    breath sounds clear to auscultation       Cardiovascular hypertension, + angina + CAD and + Past MI  + Valvular Problems/Murmurs AS  Rhythm:Regular Rate:Normal     Neuro/Psych    GI/Hepatic Neg liver ROS, GERD  ,  Endo/Other  diabetes  Renal/GU negative Renal ROS     Musculoskeletal   Abdominal   Peds  Hematology   Anesthesia Other Findings   Reproductive/Obstetrics                             Anesthesia Physical Anesthesia Plan  ASA: III  Anesthesia Plan: MAC   Post-op Pain Management:    Induction: Intravenous  PONV Risk Score and Plan: 3 and Ondansetron, Dexamethasone and Midazolam  Airway Management Planned: Simple Face Mask  Additional Equipment:   Intra-op Plan:   Post-operative Plan:   Informed Consent: I have reviewed the patients History and Physical, chart, labs and discussed the procedure including the risks, benefits and alternatives for the proposed anesthesia with the patient or authorized representative who has indicated his/her understanding and acceptance.     Dental advisory given  Plan Discussed with: CRNA and Anesthesiologist  Anesthesia Plan Comments:        Anesthesia Quick Evaluation

## 2019-12-20 NOTE — Progress Notes (Signed)
  Echocardiogram 2D Echocardiogram has been performed.  Jennette Dubin 12/20/2019, 11:23 AM

## 2019-12-20 NOTE — Anesthesia Procedure Notes (Signed)
Arterial Line Insertion Start/End9/21/2021 8:30 AM, 12/20/2019 8:40 AM Performed by: Moshe Salisbury, CRNA, CRNA  Patient location: Pre-op. Preanesthetic checklist: patient identified, IV checked, site marked, risks and benefits discussed, surgical consent, monitors and equipment checked, pre-op evaluation, timeout performed and anesthesia consent Lidocaine 1% used for infiltration Right, radial was placed Catheter size: 20 G Hand hygiene performed , maximum sterile barriers used  and Seldinger technique used  Attempts: 1 Procedure performed using ultrasound guided technique. Ultrasound Notes:anatomy identified, needle tip was noted to be adjacent to the nerve/plexus identified and no ultrasound evidence of intravascular and/or intraneural injection Following insertion, dressing applied and Biopatch. Post procedure assessment: normal  Patient tolerated the procedure well with no immediate complications.

## 2019-12-20 NOTE — Anesthesia Procedure Notes (Signed)
Procedure Name: MAC Date/Time: 12/20/2019 9:32 AM Performed by: Moshe Salisbury, CRNA Pre-anesthesia Checklist: Patient identified, Emergency Drugs available, Suction available and Patient being monitored Patient Re-evaluated:Patient Re-evaluated prior to induction Oxygen Delivery Method: Nasal cannula Placement Confirmation: positive ETCO2 Dental Injury: Teeth and Oropharynx as per pre-operative assessment

## 2019-12-21 ENCOUNTER — Inpatient Hospital Stay (HOSPITAL_COMMUNITY): Payer: Medicare Other

## 2019-12-21 DIAGNOSIS — Z952 Presence of prosthetic heart valve: Secondary | ICD-10-CM

## 2019-12-21 DIAGNOSIS — I350 Nonrheumatic aortic (valve) stenosis: Secondary | ICD-10-CM

## 2019-12-21 DIAGNOSIS — I639 Cerebral infarction, unspecified: Secondary | ICD-10-CM

## 2019-12-21 DIAGNOSIS — Z954 Presence of other heart-valve replacement: Secondary | ICD-10-CM

## 2019-12-21 LAB — POCT I-STAT, CHEM 8
BUN: 17 mg/dL (ref 8–23)
BUN: 17 mg/dL (ref 8–23)
Calcium, Ion: 1.24 mmol/L (ref 1.15–1.40)
Calcium, Ion: 1.25 mmol/L (ref 1.15–1.40)
Chloride: 105 mmol/L (ref 98–111)
Chloride: 104 mmol/L (ref 98–111)
Creatinine, Ser: 0.9 mg/dL (ref 0.44–1.00)
Creatinine, Ser: 0.9 mg/dL (ref 0.44–1.00)
Glucose, Bld: 153 mg/dL — ABNORMAL HIGH (ref 70–99)
Glucose, Bld: 132 mg/dL — ABNORMAL HIGH (ref 70–99)
HCT: 33 % — ABNORMAL LOW (ref 36.0–46.0)
HCT: 30 % — ABNORMAL LOW (ref 36.0–46.0)
Hemoglobin: 11.2 g/dL — ABNORMAL LOW (ref 12.0–15.0)
Hemoglobin: 10.2 g/dL — ABNORMAL LOW (ref 12.0–15.0)
Potassium: 3.8 mmol/L (ref 3.5–5.1)
Potassium: 4 mmol/L (ref 3.5–5.1)
Sodium: 139 mmol/L (ref 135–145)
Sodium: 139 mmol/L (ref 135–145)
TCO2: 23 mmol/L (ref 22–32)
TCO2: 22 mmol/L (ref 22–32)

## 2019-12-21 LAB — BASIC METABOLIC PANEL
Anion gap: 9 (ref 5–15)
BUN: 16 mg/dL (ref 8–23)
CO2: 21 mmol/L — ABNORMAL LOW (ref 22–32)
Calcium: 8.9 mg/dL (ref 8.9–10.3)
Chloride: 105 mmol/L (ref 98–111)
Creatinine, Ser: 1.04 mg/dL — ABNORMAL HIGH (ref 0.44–1.00)
GFR calc Af Amer: 58 mL/min — ABNORMAL LOW (ref >60)
GFR calc non Af Amer: 50 mL/min — ABNORMAL LOW (ref >60)
Glucose, Bld: 110 mg/dL — ABNORMAL HIGH (ref 70–99)
Potassium: 4.2 mmol/L (ref 3.5–5.1)
Sodium: 135 mmol/L (ref 135–145)

## 2019-12-21 LAB — CBC
HCT: 34.8 % — ABNORMAL LOW (ref 36.0–46.0)
Hemoglobin: 11 g/dL — ABNORMAL LOW (ref 12.0–15.0)
MCH: 30.2 pg (ref 26.0–34.0)
MCHC: 31.6 g/dL (ref 30.0–36.0)
MCV: 95.6 fL (ref 80.0–100.0)
Platelets: 193 10*3/uL (ref 150–400)
RBC: 3.64 MIL/uL — ABNORMAL LOW (ref 3.87–5.11)
RDW: 14.5 % (ref 11.5–15.5)
WBC: 13.3 10*3/uL — ABNORMAL HIGH (ref 4.0–10.5)
nRBC: 0 % (ref 0.0–0.2)

## 2019-12-21 LAB — ECHOCARDIOGRAM COMPLETE
AR max vel: 2.08 cm2
AV Area VTI: 2.29 cm2
AV Area mean vel: 2.36 cm2
AV Mean grad: 10.5 mmHg
AV Peak grad: 19.3 mmHg
Ao pk vel: 2.2 m/s
Area-P 1/2: 3.42 cm2
Height: 63.5 in
MV VTI: 2.42 cm2
P 1/2 time: 79 msec
S' Lateral: 2.1 cm
Weight: 2337.6 oz

## 2019-12-21 LAB — POCT I-STAT 7, (LYTES, BLD GAS, ICA,H+H)
Acid-base deficit: 2 mmol/L (ref 0.0–2.0)
Bicarbonate: 24 mmol/L (ref 20.0–28.0)
Calcium, Ion: 1.26 mmol/L (ref 1.15–1.40)
HCT: 31 % — ABNORMAL LOW (ref 36.0–46.0)
Hemoglobin: 10.5 g/dL — ABNORMAL LOW (ref 12.0–15.0)
O2 Saturation: 99 %
Potassium: 3.8 mmol/L (ref 3.5–5.1)
Sodium: 139 mmol/L (ref 135–145)
TCO2: 25 mmol/L (ref 22–32)
pCO2 arterial: 44.6 mmHg (ref 32.0–48.0)
pH, Arterial: 7.339 — ABNORMAL LOW (ref 7.350–7.450)
pO2, Arterial: 165 mmHg — ABNORMAL HIGH (ref 83.0–108.0)

## 2019-12-21 LAB — ABO/RH
ABO/RH(D): A NEG
Weak D: POSITIVE

## 2019-12-21 LAB — GLUCOSE, CAPILLARY
Glucose-Capillary: 121 mg/dL — ABNORMAL HIGH (ref 70–99)
Glucose-Capillary: 153 mg/dL — ABNORMAL HIGH (ref 70–99)
Glucose-Capillary: 107 mg/dL — ABNORMAL HIGH (ref 70–99)

## 2019-12-21 LAB — MAGNESIUM: Magnesium: 1.6 mg/dL — ABNORMAL LOW (ref 1.7–2.4)

## 2019-12-21 LAB — TYPE AND SCREEN
ABO/RH(D): A NEG
Antibody Screen: NEGATIVE
Weak D: POSITIVE

## 2019-12-21 LAB — POCT ACTIVATED CLOTTING TIME: Activated Clotting Time: 121 seconds

## 2019-12-21 MED ORDER — LEVOFLOXACIN 250 MG PO TABS
250.0000 mg | ORAL_TABLET | Freq: Once | ORAL | Status: AC
  Administered 2019-12-21: 250 mg via ORAL
  Filled 2019-12-21: qty 1

## 2019-12-21 MED ORDER — IOHEXOL 350 MG/ML SOLN
50.0000 mL | Freq: Once | INTRAVENOUS | Status: AC | PRN
  Administered 2019-12-21: 50 mL via INTRAVENOUS

## 2019-12-21 MED ORDER — NEBIVOLOL HCL 2.5 MG PO TABS
2.5000 mg | ORAL_TABLET | Freq: Every day | ORAL | Status: DC
  Administered 2019-12-21 – 2019-12-22 (×2): 2.5 mg via ORAL
  Filled 2019-12-21 (×2): qty 1

## 2019-12-21 MED ORDER — STROKE: EARLY STAGES OF RECOVERY BOOK
Freq: Once | Status: DC
  Filled 2019-12-21: qty 1

## 2019-12-21 MED ORDER — CLOPIDOGREL BISULFATE 75 MG PO TABS: 75.0000 mg | ORAL_TABLET | Freq: Every day | ORAL | 1 refills | Status: DC

## 2019-12-21 NOTE — Progress Notes (Signed)
T[facility] BRIEF PROGRESS NOTE  1 Day Post-Op  S/P Procedure(s) (LRB): TRANSCATHETER AORTIC VALVE REPLACEMENT, TRANSFEMORAL (N/A) TRANSESOPHAGEAL ECHOCARDIOGRAM (TEE) (N/A)   Clinically stable.  Mild headache persists.  No other complaints. Results of MRI/MRA discussed with patient  Plan: Consult Neurology for baseline assessment.  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  Continue ASA and Plavix  [name], MD 12/21/2019 4:36 PM

## 2019-12-21 NOTE — Consult Note (Addendum)
NEURO HOSPITALIST CONSULT NOTE   Requesting Physician: Dr. Roxy Manns     Chief Complaint: Acute Stroke   History obtained from:  Patient and Chart    HPI:                                                                                                                                         Cheryl Hernandez is an 79 y.o. female with history of type 2 diabetes mellitus on insulin, hyperlipdemia, hypertension, asthma, GERD, obesity, severe aortic stenosis, heart failure and coronary artery disease.   She had a transcatheter aortic valve replacement (TAVR) yesterday on 12/20/19. Following procedure she had headache and blurred vision. 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 Neurology was consulted for evaluation and recommendations.   The patient reports that she lives alone and is able ambulate without any assistive devices baseline. She drives and she manages all of her own finances. She has a sister that lives near by and a daughter that lives in the [place]. She shares that following her TAVR procedure she developed a headache on the top of her head and blurring of her vision. The MRI she had today made her headache worse, she describes it as a throbbing that can jump up to a 10 when she moves. She said that baseline she does have glasses and has cataracts that need to be addressed, however she is typically able to read the book at her bedside without glasses, which she is not able to do today. She denies having any numbness, weakness or difficulty with speech since her procedure yesterday.   She does have a history of migraines in her 32s that caused her to become nauseous, but she never had any vision changes that she can recall. She does have episodes now every couple of months that she calls a silent migraine, she has no pain, but develops a patch of vision loss, which typically resolves in about 1 hour. Her current blurry vision is not like  those typical episodes.   She was already told that she had a stroke and was told that it likely was plaque that broke off during the procedure. She feels that overall the procedure went well and she is happy that her deficits were not more severe. She was hopeful for discharge today, but was already told that she would need to stay overnight.   Date last known well: Date: 12/20/2019 Time last known well: Time: 09:32 tPA Given: No: Outside tPA window      Modified Rankin: Rankin Score=0  Past Medical History:  Diagnosis Date  . Arthritis   . Asthma   . CAD (coronary artery disease)    a. NSTEMI 2009 s/p DES to RCAx2.  . Carotid artery disease ([place])    a. Mild-moderate - followed by [facility].  . Chronic bronchitis ([place])    per patient  .  Chronic diarrhea   . Diabetes mellitus without complication ([place])   . Diverticulosis   . GERD (gastroesophageal reflux disease)   . HTN (hypertension)   . Hx of migraines    as a child  . Hyperlipidemia   . Myocardial infarction [facility])    per patient "about 2011"  . Neuropathy    per patient in feet, legs, and some in hands  . Obesity   . Paroxysmal ventricular tachycardia ([place])   . Rhinitis   . Rotator cuff tear   . 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 Burt Knack and Dr. Roxy Manns   . Severe aortic stenosis     Past Surgical History:  Procedure Laterality Date  . ANGIOPLASTY    . CORONARY ANGIOPLASTY WITH STENT PLACEMENT    . RIGHT/LEFT HEART CATH AND CORONARY ANGIOGRAPHY N/A 08/08/2019   Procedure: RIGHT/LEFT HEART CATH AND CORONARY ANGIOGRAPHY;  Surgeon: Belva Crome, MD;  Location: [place] [facility];  Service: Cardiovascular;  Laterality: N/A;  . TEE WITHOUT CARDIOVERSION N/A 12/20/2019   Procedure: TRANSESOPHAGEAL ECHOCARDIOGRAM (TEE);  Surgeon: Sherren Mocha, MD;  Location: [place] [facility];  Service: Open Heart Surgery;  Laterality: N/A;  .  TONSILLECTOMY    . TOTAL ABDOMINAL HYSTERECTOMY    . TOTAL HIP ARTHROPLASTY    . TRANSCATHETER AORTIC VALVE REPLACEMENT, TRANSFEMORAL N/A 12/20/2019   Procedure: TRANSCATHETER AORTIC VALVE REPLACEMENT, TRANSFEMORAL;  Surgeon: Sherren Mocha, MD;  Location: [place] [facility];  Service: Open Heart Surgery;  Laterality: N/A;    Family History  Problem Relation Age of Onset  . Allergies Mother   . Hyperlipidemia Mother   . Prostate cancer Maternal Grandfather   . Heart attack Father   . Lung disease Neg Hx          Social History:  reports that she quit smoking about 21 years ago. Her smoking use included cigarettes. She has a 30.00 pack-year smoking history. She has never used smokeless tobacco. She reports current alcohol use. She reports that she does not use drugs.  Allergies:  Allergies  Allergen Reactions  . Cefuroxime Axetil Hives and Itching  . Cephalexin Other (See Comments)    Pt. Doesn't remember reaction Total body pain in joints, couldn't swallow   . Codeine     Tolerates Dilaudid.  . Doxycycline     REACTION: vomiting/ GI upset  . Erythromycin Base Other (See Comments)    unknown    Medications:                                                                                                                           I have reviewed the patient's current medications. Scheduled Meds: .  stroke: mapping our early stages of recovery book   Does not apply Once  . aspirin EC  162 mg Oral QPM  . clopidogrel  75  mg Oral Q breakfast  . insulin aspart  0-24 Units Subcutaneous TID AC & HS  . levofloxacin  250 mg Oral Once  . loratadine  10 mg Oral Daily  . mometasone-formoterol  2 puff Inhalation BID  . nebivolol  2.5 mg Oral Daily  . sodium chloride flush  3 mL Intravenous Q12H   Continuous Infusions: . sodium chloride    . nitroGLYCERIN    . phenylephrine (NEO-SYNEPHRINE) Adult infusion     PRN Meds:.sodium chloride, acetaminophen **OR** acetaminophen, morphine  injection, ondansetron (ZOFRAN) IV, oxyCODONE, sodium chloride flush, temazepam, traMADol ROS:                                                                                                                                       History obtained from patient   General ROS: negative for - chills, fatigue, fever,  weight gain or weight loss Psychological ROS: negative for - , hallucinations, memory difficulties,  Ophthalmic ROS: see HPI ENT ROS: negative for - epistaxis, nasal discharge, oral lesions, sore throat, tinnitus or vertigo Respiratory ROS: negative for - cough,  shortness of breath or wheezing Cardiovascular ROS: negative for - chest pain,  Gastrointestinal ROS: negative for - abdominal pain, diarrhea,  nausea/vomiting or stool incontinence Genito-Urinary ROS: negative for - dysuria, hematuria, Musculoskeletal ROS: negative for - joint swelling or muscular weakness Neurological ROS: as noted in HPI   General Examination:                                                                                                      Blood pressure (!) 121/53, pulse (!) 101, temperature 98.9 F (37.2 C), temperature source Oral, resp. rate 17, height 5' 3.5" (1.613 m), weight 66.3 kg, SpO2 96 %.  Physical Exam  Constitutional: Appears well-developed and well-nourished.  Psych: Affect appropriate to situation Eyes: Normal external eye and conjunctiva. HENT: Normocephalic, no lesions, without obvious abnormality.   Musculoskeletal-no joint tenderness, deformity or swelling Cardiovascular: Normal rate and regular rhythm.  Respiratory: Effort normal, non-labored breathing saturations WNL GI: Soft.  No distension. There is no tenderness.  Skin: WDI  Neurological Examination Mental Status: Alert, oriented. Speech fluent in conversation, able to follow complex commands and repeat. Difficulty with describing a photo or naming objects.   Cranial Nerves: II:  Visual fields grossly normal with  counting figures. Reports everything is blurry-question left superior quadrantonopsia III,IV, VI: ptosis not present, extra-ocular motions intact bilaterally, pupils equal, round, reactive to light and accommodation  V,VII: smile symmetric, facial light touch sensation normal bilaterally VIII: hearing normal bilaterally IX,X: uvula rises symmetrically XI: bilateral shoulder shrug XII: midline tongue extension Motor: Right : Upper extremity   5/5    Left:     Upper extremity   5/5  Lower extremity   5/5     Lower extremity   5/5 Tone and bulk:normal tone throughout; no atrophy noted Sensory:  light touch intact throughout, bilaterally Cerebellar: normal finger-to-nose Gait: normal gait and station  NIHSS 2: for Visual Fields and Language   Lab Results: Basic Metabolic Panel: Recent Labs  Lab 12/16/19 1133 12/20/19 1154 12/21/19 0403  NA 136 140 135  K 4.4 4.0 4.2  CL 104 107 105  CO2 22  --  21*  GLUCOSE 104* 111* 110*  BUN 14 15 16   CREATININE 0.72 0.80 1.04*  CALCIUM 9.4  --  8.9  MG  --   --  1.6*    CBC: Recent Labs  Lab 12/16/19 1133 12/20/19 1154 12/21/19 0403  WBC 12.6*  --  13.3*  HGB 12.4 9.9* 11.0*  HCT 39.3 29.0* 34.8*  MCV 94.9  --  95.6  PLT 258  --  193    Lipid Panel: No results for input(s): CHOL, TRIG, HDL, CHOLHDL, VLDL, LDLCALC in the last 168 hours.  CBG: Recent Labs  Lab 12/20/19 0731 12/20/19 1809 12/20/19 2118 12/21/19 0606 12/21/19 1130  GLUCAP 125* 106* 154* 121* 153*    Imaging: MR ANGIO HEAD WO CONTRAST  Result Date: 12/21/2019 CLINICAL DATA:  79 year old female with new or increased frequency of headache. Nausea and vomiting onset this morning. EXAM: MRA HEAD WITHOUT CONTRAST TECHNIQUE: Angiographic images of the Circle of Willis were obtained using MRA technique without intravenous contrast. COMPARISON:  Brain MRI today reported separately. FINDINGS: Antegrade flow in the posterior circulation with patent codominant distal  vertebral arteries. Patent bilateral PICA origins and vertebrobasilar junction. Patent basilar artery, SCA and right PCA origin. There is a fetal type left PCA origin. Bilateral PCA branches remain within normal limits. No posterior circulation stenosis identified. Antegrade flow in both ICA siphons. There is mild left and up to moderate right supraclinoid ICA stenosis. 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 Small infundibulum of the distal right ICA also suspected. Patent carotid termini, MCA and ACA origins. There is moderate stenosis at the left ACA origin. The right ACA is mildly dominant throughout. MCA M1 segments and MCA bi/trifurcations are patent without stenosis. Visible bilateral MCA branches are within normal limits. IMPRESSION: 1. Negative for large vessel occlusion, negative posterior circulation. 2. Positive for up to moderate stenosis of the ICA siphons (greater on the right) and the left ACA origin. 3. 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 Electronically Signed   By: Genevie Ann M.D.   On: 12/21/2019 14:59   MR BRAIN WO CONTRAST  Result Date: 12/21/2019 CLINICAL DATA:  79 year old female with new or increased frequency of headache. Nausea and vomiting onset this morning. EXAM: MRI HEAD WITHOUT CONTRAST TECHNIQUE: Multiplanar, multiecho pulse sequences of the brain and surrounding structures were obtained without intravenous contrast. COMPARISON:  Paranasal sinus CT 02/06/2005 FINDINGS: The examination was discontinued prior to completion by patient request. Sagittal T1 weighted imaging, axial T2 weighted imaging, axial and coronal DWI imaging only was obtained. Major intracranial vascular flow voids are preserved. Areas of restricted diffusion are scattered in the left cerebellum, bilateral occipital  lobes (confluent on the left), posterior bilateral parietal lobes, right thalamus, and possibly also the  posterior temporal lobes. The largest area is in the medial right occipital lobe encompassing about 4 cm. The brainstem appears spared. Additionally however, there are 2 subtle areas of anterior circulation restricted diffusion suspected in the superior frontal lobes on series 4, images 43 and 45. The remaining anterior circulation appear spared. Associated T2 hyperintensity in the affected areas compatible with cytotoxic edema. No intracranial mass effect and no strong evidence of acute hemorrhage on this limited exam. Normal basilar cisterns. Negative pituitary and cervicomedullary junction. Degenerative changes in the visible cervical spine including ligamentous hypertrophy about the odontoid. Visualized bone marrow signal is within normal limits. Fluid levels in the paranasal sinuses. The visible pharynx and mastoids are pneumatized. Negative orbits. IMPRESSION: 1. Limited exam, discontinued early by patient request. 2. Positive for multifocal acute infarcts - predominantly in the bilateral posterior circulation although there is trace anterior circulation involvement also. Therefore, consider a recent embolic event. The most confluent infarct is in the Right PCA territory. 3. No associated intracranial hemorrhage or mass effect. 4. Fluid levels in the bilateral paranasal sinuses might indicate acute sinusitis. Electronically Signed   By: Genevie Ann M.D.   On: 12/21/2019 14:55   ECHOCARDIOGRAM COMPLETE  Result Date: 12/21/2019    ECHOCARDIOGRAM REPORT   Patient Name:   AVRIANNA SMART Sperbeck Date of Exam: 12/21/2019 Medical Rec #:  [id number]         Height:       63.5 in Accession #:    [id number]        Weight:       146.2 lb Date of Birth:  1940-07-10         BSA:          1.702 m Patient Age:    30 years          BP:           121/53 mmHg Patient Gender: F                 HR:           94 bpm. Exam Location:  Inpatient Procedure: 2D Echo, Color Doppler and Cardiac Doppler Indications:     Post TAVR Evaluation   History:         Patient has prior history of Echocardiogram examinations, most                  recent 12/20/2019. CAD; Risk Factors:Hypertension, Diabetes and                  Dyslipidemia. 70mm CoreValve-Evolut Pro TAVR Bioprosthetic                  placed 12/20/19.                  Aortic Valve: CoreValve-Evolut Pro prosthetic, stented (TAVR)                  valve is present in the aortic position. Procedure Date:                  12/20/2019.  Sonographer:     Raquel Sarna Senior Referring Phys:  [id number] Woodfin Ganja THOMPSON Diagnosing Phys: Sanda Klein MD IMPRESSIONS  1. A 3.5 m/s intracavitary "gradient" is present due to hyperdynamic left ventricular contraction. Left ventricular ejection fraction, by estimation, is 70 to 75%. The left ventricle has hyperdynamic function. The  left ventricle has no regional wall motion abnormalities. Left ventricular diastolic parameters are consistent with Grade II diastolic dysfunction (pseudonormalization). Elevated left atrial pressure.  2. Right ventricular systolic function is normal. The right ventricular size is normal. There is mildly elevated pulmonary artery systolic pressure.  3. The mitral valve is normal in structure. Mild to moderate mitral valve regurgitation. No evidence of mitral stenosis. Moderate mitral annular calcification.  4. The aortic valve has been repaired/replaced. Aortic valve regurgitation is not visualized. No aortic stenosis is present. There is a CoreValve-Evolut Pro prosthetic (TAVR) valve present in the aortic position. Procedure Date: 12/20/2019.  5. The inferior vena cava is normal in size with greater than 50% respiratory variability, suggesting right atrial pressure of 3 mmHg. FINDINGS  Left Ventricle: A 3.5 m/s intracavitary "gradient" is present due to hyperdynamic left ventricular contraction. Left ventricular ejection fraction, by estimation, is 70 to 75%. The left ventricle has hyperdynamic function. The left ventricle has no regional  wall motion abnormalities. The left ventricular internal cavity size was normal in size. There is no left ventricular hypertrophy. Left ventricular diastolic parameters are consistent with Grade II diastolic dysfunction (pseudonormalization). Elevated left atrial pressure. Right Ventricle: The right ventricular size is normal. No increase in right ventricular wall thickness. Right ventricular systolic function is normal. There is mildly elevated pulmonary artery systolic pressure. The tricuspid regurgitant velocity is 2.91  m/s, and with an assumed right atrial pressure of 3 mmHg, the estimated right ventricular systolic pressure is 25.3 mmHg. Left Atrium: Left atrial size was normal in size. Right Atrium: Right atrial size was normal in size. Pericardium: There is no evidence of pericardial effusion. Mitral Valve: The mitral valve is normal in structure. Moderate mitral annular calcification. Mild to moderate mitral valve regurgitation, with centrally-directed jet. No evidence of mitral valve stenosis. MV peak gradient, 16.2 mmHg. Tricuspid Valve: The tricuspid valve is normal in structure. Tricuspid valve regurgitation is trivial. No evidence of tricuspid stenosis. Aortic Valve: The aortic valve has been repaired/replaced. Aortic valve regurgitation is not visualized. No aortic stenosis is present. Aortic valve mean gradient measures 10.5 mmHg. Aortic valve peak gradient measures 19.3 mmHg. Aortic valve area, by VTI measures 2.29 cm. There is a CoreValve-Evolut Pro prosthetic, stented (TAVR) valve present in the aortic position. Procedure Date: 12/20/2019. Pulmonic Valve: The pulmonic valve was normal in structure. Pulmonic valve regurgitation is not visualized. No evidence of pulmonic stenosis. Aorta: The aortic root is normal in size and structure. Venous: The inferior vena cava is normal in size with greater than 50% respiratory variability, suggesting right atrial pressure of 3 mmHg. IAS/Shunts: No atrial  level shunt detected by color flow Doppler.  LEFT VENTRICLE PLAX 2D LVIDd:         3.30 cm LVIDs:         2.10 cm LV PW:         1.00 cm LV IVS:        1.20 cm LVOT diam:     2.10 cm LV SV:         94 LV SV Index:   55 LVOT Area:     3.46 cm  RIGHT VENTRICLE RV S prime:     16.00 cm/s TAPSE (M-mode): 2.2 cm LEFT ATRIUM           Index LA diam:      3.60 cm 2.11 cm/m LA Vol (A2C): 52.8 ml 31.02 ml/m LA Vol (A4C): 63.9 ml 37.54 ml/m  AORTIC VALVE AV Area (  Vmax):    2.08 cm AV Area (Vmean):   2.36 cm AV Area (VTI):     2.29 cm AV Vmax:           219.50 cm/s AV Vmean:          145.000 cm/s AV VTI:            0.411 m AV Peak Grad:      19.3 mmHg AV Mean Grad:      10.5 mmHg LVOT Vmax:         132.00 cm/s LVOT Vmean:        98.900 cm/s LVOT VTI:          0.272 m LVOT/AV VTI ratio: 0.66 MITRAL VALVE                TRICUSPID VALVE MV Area (PHT): 3.42 cm     TR Peak grad:   33.9 mmHg MV Area VTI:   2.42 cm     TR Vmax:        291.00 cm/s MV Peak grad:  16.2 mmHg MV Vmax:       2.01 m/s     SHUNTS MV Vmean:      127.0 cm/s   Systemic VTI:  0.27 m MV VTI:        0.39 m       Systemic Diam: 2.10 cm MV PHT:        79.00 msec MV Decel Time: 222 msec MV E velocity: 161.00 cm/s MV A velocity: 120.00 cm/s MV E/A ratio:  1.34 Mihai Croitoru MD Electronically signed by Sanda Klein MD Signature Date/Time: 12/21/2019/1:05:19 PM    Final    ECHOCARDIOGRAM LIMITED  Result Date: 12/20/2019    ECHOCARDIOGRAM LIMITED REPORT   Patient Name:   JOVON STREETMAN Severin Date of Exam: 12/20/2019 Medical Rec #:  [id number]         Height:       63.5 in Accession #:    [id number]        Weight:       138.0 lb Date of Birth:  09-16-1940         BSA:          1.661 m Patient Age:    40 years          BP:           171/69 mmHg Patient Gender: F                 HR:           98 bpm. Exam Location:  Inpatient Procedure: Limited Echo, Limited Color Doppler, Cardiac Doppler and Echo            Assisted Procedure Indications:     TAVR Procedure;  Aortic Stenosis  History:         Patient has prior history of Echocardiogram examinations, most                  recent 10/07/2019. Previous Myocardial Infarction and CAD; Risk                  Factors:Dyslipidemia, Diabetes and Hypertension.  Sonographer:     Mikki Santee RDCS (AE) Referring Phys:  [place] Diagnosing Phys: Sanda Klein MD                   PRE-PROCEDURE FINDINGS  Hyperdynamic left ventricular systolic function, estimated EF                  70%.                  Moderate to severe calcific aortic stenosis. No aortic                  insufficiency.                  Peak aortic gradient 42 mm Hg, mean gradient 27 mm Hg,                  dimensionless index 0.35, calculated valve area 1.07 cm2.                  Mild mitral insufficiency.                  No pericardial effusion.                   POST-PROCEDURE FINDINGS                   Hyperdynamic left ventricular systolic function, estimated EF                  >75%.                  Well seated Medtronic EvolutR stent valve (TAVR). No aortic                  insufficiency/perivalvular leak.                  Peak aortic gradient 11 mm Hg, mean gradient 6 mm Hg,                  dimensionless index 0.81, calculated valve area 2.4 cm2.                  Mild mitral insufficiency.                  No pericardial effusion. IMPRESSIONS  1. Left ventricular ejection fraction, by estimation, is 70 to 75%. The left ventricle has hyperdynamic function. The left ventricle has no regional wall motion abnormalities.  2. Right ventricular systolic function is hyperdynamic. The right ventricular size is normal.  3. The mitral valve is normal in structure. Mild mitral valve regurgitation.  4. Aortic valve regurgitation is not visualized. FINDINGS  Left Ventricle: Left ventricular ejection fraction, by estimation, is 70 to 75%. The left ventricle has hyperdynamic function. The left ventricle has no regional wall motion  abnormalities. Right Ventricle: The right ventricular size is normal. No increase in right ventricular wall thickness. Right ventricular systolic function is hyperdynamic. Pericardium: There is no evidence of pericardial effusion. Mitral Valve: The mitral valve is normal in structure. Mild mitral valve regurgitation. Tricuspid Valve: The tricuspid valve is normal in structure. Tricuspid valve regurgitation is not demonstrated. Aortic Valve: Aortic valve regurgitation is not visualized. Aortic valve mean gradient measures 6.0 mmHg. Aortic valve peak gradient measures 10.9 mmHg. Aortic valve area, by VTI measures 2.43 cm. There is a 26 mm CoreValve-Evolut Pro prosthetic, stented (TAVR) valve present in the aortic position. Pulmonic Valve: The pulmonic valve was not assessed. Aorta: The aortic root and ascending aorta are structurally normal, with no evidence of dilitation. LEFT VENTRICLE PLAX 2D LVOT diam:     2.00 cm LV SV:  104 LV SV Index:   63 LVOT Area:     3.14 cm  AORTIC VALVE AV Area (Vmax):    2.55 cm AV Area (Vmean):   1.66 cm AV Area (VTI):     2.43 cm AV Vmax:           165.00 cm/s AV Vmean:          179.000 cm/s AV VTI:            0.429 m AV Peak Grad:      10.9 mmHg AV Mean Grad:      6.0 mmHg LVOT Vmax:         134.00 cm/s LVOT Vmean:        94.600 cm/s LVOT VTI:          0.332 m LVOT/AV VTI ratio: 0.77  SHUNTS Systemic VTI:  0.33 m Systemic Diam: 2.00 cm Sanda Klein MD Electronically signed by Sanda Klein MD Signature Date/Time: 12/20/2019/5:52:47 PM    Final    Structural Heart Procedure  Result Date: 12/20/2019 See surgical note for result.     Assessment: 79 y.o. female with history of type 2 diabetes mellitus on insulin, hyperlipdemia, hypertension, asthma, GERD, obesity, severe aortic stenosis, heart failure and coronary artery disease. She had a transcatheter aortic valve replacement (TAVR) yesterday on 12/20/19. Following procedure she had headache and blurred vision. 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 Neurology was consulted for evaluation and recommendations.   The patient reports that she does baseline have cataracts and wear glasses, however she is able to normally read a book without glasses. Today she complains of blurred vision and hedache. She was unable to explain what was happening in the photo with the children on the stool and the women doing dishes from the NIHSS scale. She had great difficulty naming objects from the scale as well. She was able to accurately identify number of fingers within her visual field. Strength 5/5 all extremities and sensation intact. NIHSS 2.  Outside the tPA window and she is not a candidate for thrombectomy as no large vessel occlussion was identified.     12/21/19: MRI Brain Today: Radiology Impression: Positive for multifocal acute infarcts - predominantly in the bilateral posterior circulation although there is trace anterior circulation involvement also. Therefore, consider a recent embolic event.The most confluent infarct is in the Right PCA territory. 12/21/19: [facility] Brain Today: Radiology Impression: . Negative for large vessel occlusion, negative posterior Circulation. 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 No large vessel occlusion. 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 EF 70-75%. No thrombus. 12/16/19 HgbA1c: 6  07/04/19 LDL: 64  Impression:  Acute Ischemic Stroke: Multifocal acute infarcts bilateral posterior circulation, especially Right PCA and trace infarcts anterior circulation.   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 Rory Percy  HgbA1c, fasting lipid panel ordered:  Pending  PT consult, OT consult placed. Significant difficulty with vision when examined today, may impact mobility.  SLP - difficulty describing photo and naming object, in discussion with Dr. Rory Percy, it is unclear if this is due to vision or if she has an element of aphasia when describing what she sees.  Frequent neuro checks ordered. Purple laminated NIHSS left in room for nursing.   NPO until passes stroke swallow screen  Patient is already on dual antiplatelet from cardiology. Continue Aspirin and Plavix  On medication for both diabetes (insulin) and hypertension. Most recent A1c: 6  Hyperlipidemia: managed with repatha injections  Patient does not smoke, quit 20 years ago  Stroke book ordered and stroke education to be provided and documented by nursing staff prior to discharge. Stroke Quality Nurses and Stroke Response Nurses are available for questions if any assistance is needed.    Gwenyth Bouillon DNP, NP-C Neurohospitalist Nurse Practitioner 12/21/2019, 4:34 PM    Attending Neurohospitalist Addendum Patient seen and examined with APP/Resident. Agree with the history and physical as documented above. Agree with the plan as documented, which I helped formulate. I have independently reviewed the chart, obtained history, review of systems and examined the patient.I have personally reviewed pertinent head/neck/spine imaging (CT/MRI). Acute ischemia in multiple vascular territories, likely cardioembolic vs atheroembolic from a central source such as aorta as would be expected after TAVR.    Please feel free to call with any questions. --- Amie Portland, MD [facility] Pager: [phone]  If 7pm to 7am, please call on call as listed on AMION.

## 2019-12-21 NOTE — Discharge Summary (Addendum)
[place] VALVE TEAM  Discharge Summary    Patient ID: Cheryl Hernandez MRN: [id number]; DOB: Sep 23, 1940  Admit date: 12/20/2019 Discharge date: 12/22/2019  Primary Care Provider: [name], MD  Primary Cardiologist: Sinclair Grooms, MD / Dr. Burt Knack & Dr. Roxy Manns (TAVR)   Discharge Diagnoses    Principal Problem:   S/P TAVR (transcatheter aortic valve replacement) Active Problems:   Type 2 diabetes mellitus without complication, without long-term current use of insulin (HCC)   Hyperlipidemia   Essential hypertension   Paroxysmal tachycardia (HCC)   Asthma, moderate persistent   GERD, SEVERE   Obesity   Severe aortic stenosis   CAD (coronary artery disease)   Acute on chronic diastolic heart failure (HCC)   Cerebral embolism with cerebral infarction   Allergies Allergies  Allergen Reactions  . Cefuroxime Axetil Hives and Itching  . Cephalexin Other (See Comments)    Pt. Doesn't remember reaction Total body pain in joints, couldn't swallow   . Codeine     Tolerates Dilaudid.  . Doxycycline     REACTION: vomiting/ GI upset  . Erythromycin Base Other (See Comments)    unknown  . Statins     Diagnostic Studies/Procedures    TAVR OPERATIVE NOTE   Date of Procedure:                12/20/2019  Preoperative Diagnosis:      Severe Aortic Stenosis   Postoperative Diagnosis:    Same   Procedure:        Transcatheter Aortic Valve Replacement - Percutaneous Right Transfemoral Approach             Medtronic CoreValve Evolut Pro (size 26 mm, serial # [id number])              Co-Surgeons:                        Valentina Gu. Roxy Manns, MD and Sherren Mocha, MD  Anesthesiologist:                  Belinda Block, MD  Echocardiographer:              Sanda Klein, MD  Pre-operative Echo Findings: ? Severe aortic stenosis ? Normal left ventricular systolic function  Post-operative Echo Findings: ? No paravalvular  leak ? Normal left ventricular systolic function  _____________   Echo 12/21/19:  IMPRESSIONS  1. A 3.5 m/s intracavitary "gradient" is present due to hyperdynamic left  ventricular contraction. Left ventricular ejection fraction, by  estimation, is 70 to 75%. The left ventricle has hyperdynamic function.  The left ventricle has no regional wall  motion abnormalities. Left ventricular diastolic parameters are consistent  with Grade II diastolic dysfunction (pseudonormalization). Elevated left  atrial pressure.  2. Right ventricular systolic function is normal. The right ventricular  size is normal. There is mildly elevated pulmonary artery systolic  pressure.  3. The mitral valve is normal in structure. Mild to moderate mitral valve  regurgitation. No evidence of mitral stenosis. Moderate mitral annular  calcification.  4. The aortic valve has been repaired/replaced. Aortic valve  regurgitation is not visualized. No aortic stenosis is present. There is a  CoreValve-Evolut Pro prosthetic (TAVR) valve present in the aortic  position. Procedure Date: 12/20/2019.  5. The inferior vena cava is normal in size with greater than 50%  respiratory variability, suggesting right atrial pressure of 3 mmHg.  History of Present Illness     Cheryl Hernandez is a 79 y.o. female with a history of CAD s/p acute MI (2009), carotid artery disease (48-54% LICA), HTN, HLD, degenerative arthritis of the knees, GERD, paroxysmal VT, DMT2, and severe paradoxical LFLG AS who presented to [facility] on 12/20/19 for planned TAVR.   Patient states that she has known of presence of a heart murmur for many years.  In 2009 she suffered an acute non-ST segment elevation myocardial infarction for which she was treated with PCI and stenting of the right coronary artery.  She has been followed carefully ever since by Dr. [name].  She reported a 2-3 year hx of progressive exertional shortness of breath and fatigue.  Echocardiograms have documented the presence of aortic stenosis that has gradually increased in severity. Diagnostic cardiac catheterization was performed by Dr. [name] on Aug 08, 2019.  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 Catheterization also confirmed the presence of aortic stenosis with peak to peak and mean transvalvular gradients measured 33 and 29 mmHg respectively, corresponding to aortic valve area calculated only 0.81 cm.  Right-sided pressures were normal. Follow-up transthoracic echocardiogram was performed October 07, 2019 revealing findings consistent with normal left ventricular systolic function but paradoxical severe low-flow low gradient aortic stenosis.  The patient has been evaluated by the multidisciplinary valve team and felt to have severe, symptomatic aortic stenosis and to be a suitable candidate for TAVR, which was set up for 12/20/19.   Hospital Course     Consultants: none  Severe AS: s/p successful TAVR with a 26 mm Medtronic Evolut Pro + THV via the TF approach on 12/20/19. Post operative echo showed EF 70%, normally functioning TAVR with a mean gradient of 10.5 mmHg and no PVL. Mild-mod MR. Groin sites are stable. ECG with sinus tachy (HR 101) and no high grade heart block. Continue Asprin and started on plavix 75 mg daily. Plan for discharge home today with close follow up in the office next week.   Post operative CVA: pt developed a persistent headache and blurry vision s/p TAVR. MR brain was positive for multifocal acute infarcts - predominantly in the bilateral posterior circulation although there is trace anterior circulation involvement also. The most confluent infarct is in the Right PCA territory. CT angio showed acute infarct in the right PCA territory involving the  inferior right occipital lobe and right lateral thalamus. Additional small area of acute infarct in the left superior cerebellum. No intracranial hemorrhage. She has been seen by the stroke team. They have requested an ambulatory monitor to rule out afib. Will order a 14 day Zio AT prior to discharge. She will be continued on DAPT. She is intolerant to statins and on Repatha. She has been asked to not drive until evaluated by opthalmology. Will order HHPT/OT.  Carotid artery disease: pre TAVR dopplers showed 62-70% LICA stenosis. Continue antiplatelet therapy. Intolerant to statins. On Repatha.   HTN: BP stable. Resume home Bystolic 2.5 mg daily.    Acute on chronic diastolic CHF: as evidenced by an elevated BNP on pre admission lab work. This has been treated with TAVR.   DMT2: treated with SSI while admitted. Resume home meds at discharge. Okay to resume Metformin after 48 hours after contrast dye exposure (9/23 PM)   _____________  Discharge Vitals Blood pressure 126/60, pulse 90, temperature  98.3 F (36.8 C), temperature source Oral, resp. rate 18, height 5' 3.5" (1.613 m), weight 64.6 kg, SpO2 97 %.  Filed Weights   12/20/19 0728 12/21/19 0235 12/22/19 0449  Weight: 62.6 kg 66.3 kg 64.6 kg    GEN: Well nourished, well developed, in no acute distress HEENT: normal Neck: no JVD or masses Cardiac: RRR; no murmurs, rubs, or gallops,no edema  Respiratory:  clear to auscultation bilaterally, normal work of breathing GI: soft, nontender, nondistended, + BS MS: no deformity or atrophy Skin: warm and dry, no rash.  Groin sites clear without hematoma or ecchymosis  Neuro:  Alert and Oriented x 3, Strength and sensation are intact Psych: euthymic mood, full affect   Labs & Radiologic Studies    CBC Recent Labs    12/21/19 0403 12/22/19 0841  WBC 13.3* 10.3  HGB 11.0* 11.3*  HCT 34.8* 35.7*  MCV 95.6 97.0  PLT 193 937   Basic Metabolic Panel Recent Labs    12/20/19 1154  12/21/19 0403  NA 140 135  K 4.0 4.2  CL 107 105  CO2  --  21*  GLUCOSE 111* 110*  BUN 15 16  CREATININE 0.80 1.04*  CALCIUM  --  8.9  MG  --  1.6*   Liver Function Tests No results for input(s): AST, ALT, ALKPHOS, BILITOT, PROT, ALBUMIN in the last 72 hours. No results for input(s): LIPASE, AMYLASE in the last 72 hours. Cardiac Enzymes No results for input(s): CKTOTAL, CKMB, CKMBINDEX, TROPONINI in the last 72 hours. BNP Invalid input(s): POCBNP D-Dimer No results for input(s): DDIMER in the last 72 hours. Hemoglobin A1C No results for input(s): HGBA1C in the last 72 hours. Fasting Lipid Panel No results for input(s): CHOL, HDL, LDLCALC, TRIG, CHOLHDL, LDLDIRECT in the last 72 hours. Thyroid Function Tests No results for input(s): TSH, T4TOTAL, T3FREE, THYROIDAB in the last 72 hours.  Invalid input(s): FREET3 _____________  CT ANGIO HEAD W OR WO CONTRAST  Result Date: 12/21/2019 CLINICAL DATA:  Stroke EXAM: CT ANGIOGRAPHY HEAD AND NECK TECHNIQUE: Multidetector CT imaging of the head and neck was performed using the standard protocol during bolus administration of intravenous contrast. Multiplanar CT image reconstructions and MIPs were obtained to evaluate the vascular anatomy. Carotid stenosis measurements (when applicable) are obtained utilizing NASCET criteria, using the distal internal carotid diameter as the denominator. CONTRAST:  20mL OMNIPAQUE IOHEXOL 350 MG/ML SOLN COMPARISON:  MRI head 12/21/2019 FINDINGS: CT HEAD FINDINGS Brain: Hypodensity right inferior occipital lobe compatible with acute infarct. No hemorrhage. Small areas of acute infarct in the left superior cerebellum and right lateral thalamus also noted on MRI. Mild atrophy without hydrocephalus. Chronic microvascular ischemic changes in the white matter. Negative for hemorrhage or mass. Vascular: Negative for hyperdense vessel Skull: Negative Sinuses: Sinus mucosal disease. Probable air-fluid levels in the  maxillary sinus bilaterally. Negative orbit. Orbits: Negative orbit Review of the MIP images confirms the above findings CTA NECK FINDINGS Aortic arch: Atherosclerotic calcification in the aortic arch and proximal great vessels. No significant stenosis. Right carotid system: Atherosclerotic calcification right carotid bifurcation. 25% diameter stenosis proximal right internal carotid artery. Left carotid system: Atherosclerotic calcification left carotid bifurcation with approximately 25% diameter stenosis left internal carotid artery. Vertebral arteries: Both vertebral arteries patent to the basilar without significant stenosis. Skeleton: Advanced cervical spondylosis with disc and facet degeneration and 3 mm anterolisthesis C4-5. 2 mm anterolisthesis C5-6. Negative for fracture or mass lesion. Other neck: Negative for mass or adenopathy in the neck. Upper  chest: Lung apices clear bilaterally. Review of the MIP images confirms the above findings CTA HEAD FINDINGS Anterior circulation: Atherosclerotic calcification throughout the cavernous carotid bilaterally without significant stenosis. Anterior and middle cerebral arteries patent bilaterally without significant stenosis. Posterior circulation: Both vertebral arteries patent to the basilar. PICA patent bilaterally. Basilar widely patent. Superior cerebellar arteries patent bilaterally. Fetal origin left posterior cerebral artery is patent. Proximal right posterior cerebral artery is patent. There is a probable stenosis in the right P3 segment. Venous sinuses: Normal venous enhancement. Anatomic variants: None Review of the MIP images confirms the above findings IMPRESSION: 1. Acute infarct in the right PCA territory involving the inferior right occipital lobe and right lateral thalamus. Additional small area of acute infarct in the left superior cerebellum. No intracranial hemorrhage. 2. Atherosclerotic calcification of the carotid bifurcation with 25% diameter  stenosis of the internal carotid artery bilaterally 3. Both vertebral arteries are patent to the basilar without stenosis 4. Atherosclerotic calcification in the cavernous carotid bilaterally. No significant stenosis in the anterior circulation. 5. Probable stenosis right P3 segment, not well visualized due to small size of vessel. No large vessel occlusion. Electronically Signed   By: Franchot Gallo M.D.   On: 12/21/2019 21:06   DG Chest 2 View  Result Date: 12/17/2019 CLINICAL DATA:  79 year old female with a history upcoming TAVR 12/20/2019 EXAM: CHEST - 2 VIEW COMPARISON:  10/13/2014 FINDINGS: Cardiomediastinal silhouette unchanged in size and contour. No pneumothorax. No pleural effusion. No confluent airspace disease. Coarsened interstitial markings throughout, similar to the prior. Degenerative changes of the spine.  No acute displaced fracture. IMPRESSION: Chronic lung changes without evidence of acute cardiopulmonary disease Electronically Signed   By: Corrie Mckusick D.O.   On: 12/17/2019 14:21   CT ANGIO NECK W OR WO CONTRAST  Result Date: 12/21/2019 CLINICAL DATA:  Stroke EXAM: CT ANGIOGRAPHY HEAD AND NECK TECHNIQUE: Multidetector CT imaging of the head and neck was performed using the standard protocol during bolus administration of intravenous contrast. Multiplanar CT image reconstructions and MIPs were obtained to evaluate the vascular anatomy. Carotid stenosis measurements (when applicable) are obtained utilizing NASCET criteria, using the distal internal carotid diameter as the denominator. CONTRAST:  60mL OMNIPAQUE IOHEXOL 350 MG/ML SOLN COMPARISON:  MRI head 12/21/2019 FINDINGS: CT HEAD FINDINGS Brain: Hypodensity right inferior occipital lobe compatible with acute infarct. No hemorrhage. Small areas of acute infarct in the left superior cerebellum and right lateral thalamus also noted on MRI. Mild atrophy without hydrocephalus. Chronic microvascular ischemic changes in the white matter.  Negative for hemorrhage or mass. Vascular: Negative for hyperdense vessel Skull: Negative Sinuses: Sinus mucosal disease. Probable air-fluid levels in the maxillary sinus bilaterally. Negative orbit. Orbits: Negative orbit Review of the MIP images confirms the above findings CTA NECK FINDINGS Aortic arch: Atherosclerotic calcification in the aortic arch and proximal great vessels. No significant stenosis. Right carotid system: Atherosclerotic calcification right carotid bifurcation. 25% diameter stenosis proximal right internal carotid artery. Left carotid system: Atherosclerotic calcification left carotid bifurcation with approximately 25% diameter stenosis left internal carotid artery. Vertebral arteries: Both vertebral arteries patent to the basilar without significant stenosis. Skeleton: Advanced cervical spondylosis with disc and facet degeneration and 3 mm anterolisthesis C4-5. 2 mm anterolisthesis C5-6. Negative for fracture or mass lesion. Other neck: Negative for mass or adenopathy in the neck. Upper chest: Lung apices clear bilaterally. Review of the MIP images confirms the above findings CTA HEAD FINDINGS Anterior circulation: Atherosclerotic calcification throughout the cavernous carotid bilaterally without significant stenosis.  Anterior and middle cerebral arteries patent bilaterally without significant stenosis. Posterior circulation: Both vertebral arteries patent to the basilar. PICA patent bilaterally. Basilar widely patent. Superior cerebellar arteries patent bilaterally. Fetal origin left posterior cerebral artery is patent. Proximal right posterior cerebral artery is patent. There is a probable stenosis in the right P3 segment. Venous sinuses: Normal venous enhancement. Anatomic variants: None Review of the MIP images confirms the above findings IMPRESSION: 1. Acute infarct in the right PCA territory involving the inferior right occipital lobe and right lateral thalamus. Additional small area of  acute infarct in the left superior cerebellum. No intracranial hemorrhage. 2. Atherosclerotic calcification of the carotid bifurcation with 25% diameter stenosis of the internal carotid artery bilaterally 3. Both vertebral arteries are patent to the basilar without stenosis 4. Atherosclerotic calcification in the cavernous carotid bilaterally. No significant stenosis in the anterior circulation. 5. Probable stenosis right P3 segment, not well visualized due to small size of vessel. No large vessel occlusion. Electronically Signed   By: Franchot Gallo M.D.   On: 12/21/2019 21:06   MR ANGIO HEAD WO CONTRAST  Result Date: 12/21/2019 CLINICAL DATA:  79 year old female with new or increased frequency of headache. Nausea and vomiting onset this morning. EXAM: MRA HEAD WITHOUT CONTRAST TECHNIQUE: Angiographic images of the Circle of Willis were obtained using MRA technique without intravenous contrast. COMPARISON:  Brain MRI today reported separately. FINDINGS: Antegrade flow in the posterior circulation with patent codominant distal vertebral arteries. Patent bilateral PICA origins and vertebrobasilar junction. Patent basilar artery, SCA and right PCA origin. There is a fetal type left PCA origin. Bilateral PCA branches remain within normal limits. No posterior circulation stenosis identified. Antegrade flow in both ICA siphons. There is mild left and up to moderate right supraclinoid ICA stenosis. 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 Small infundibulum of the distal right ICA also suspected. Patent carotid termini, MCA and ACA origins. There is moderate stenosis at the left ACA origin. The right ACA is mildly dominant throughout. MCA M1 segments and MCA bi/trifurcations are patent without stenosis. Visible bilateral MCA branches are within normal limits. IMPRESSION: 1. Negative for large vessel occlusion, negative posterior circulation. 2.  Positive for up to moderate stenosis of the ICA siphons (greater on the right) and the left ACA origin. 3. 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 Electronically Signed   By: Genevie Ann M.D.   On: 12/21/2019 14:59   MR BRAIN WO CONTRAST  Result Date: 12/21/2019 CLINICAL DATA:  79 year old female with new or increased frequency of headache. Nausea and vomiting onset this morning. EXAM: MRI HEAD WITHOUT CONTRAST TECHNIQUE: Multiplanar, multiecho pulse sequences of the brain and surrounding structures were obtained without intravenous contrast. COMPARISON:  Paranasal sinus CT 02/06/2005 FINDINGS: The examination was discontinued prior to completion by patient request. Sagittal T1 weighted imaging, axial T2 weighted imaging, axial and coronal DWI imaging only was obtained. Major intracranial vascular flow voids are preserved. Areas of restricted diffusion are scattered in the left cerebellum, bilateral occipital lobes (confluent on the left), posterior bilateral parietal lobes, right thalamus, and possibly also the posterior temporal lobes. The largest area is in the medial right occipital lobe encompassing about 4 cm. The brainstem appears spared. Additionally however, there are 2 subtle areas of anterior circulation restricted diffusion suspected in the superior frontal lobes on series 4, images 43 and 45. The remaining anterior circulation appear spared. Associated T2 hyperintensity  in the affected areas compatible with cytotoxic edema. No intracranial mass effect and no strong evidence of acute hemorrhage on this limited exam. Normal basilar cisterns. Negative pituitary and cervicomedullary junction. Degenerative changes in the visible cervical spine including ligamentous hypertrophy about the odontoid. Visualized bone marrow signal is within normal limits. Fluid levels in the paranasal sinuses. The visible pharynx and mastoids are pneumatized. Negative orbits. IMPRESSION: 1. Limited exam, discontinued  early by patient request. 2. Positive for multifocal acute infarcts - predominantly in the bilateral posterior circulation although there is trace anterior circulation involvement also. Therefore, consider a recent embolic event. The most confluent infarct is in the Right PCA territory. 3. No associated intracranial hemorrhage or mass effect. 4. Fluid levels in the bilateral paranasal sinuses might indicate acute sinusitis. Electronically Signed   By: Genevie Ann M.D.   On: 12/21/2019 14:55   ECHOCARDIOGRAM COMPLETE  Result Date: 12/21/2019    ECHOCARDIOGRAM REPORT   Patient Name:   Cheryl Hernandez Date of Exam: 12/21/2019 Medical Rec #:  [id number]         Height:       63.5 in Accession #:    [id number]        Weight:       146.2 lb Date of Birth:  [birth year]         BSA:          1.702 m Patient Age:    40 years          BP:           121/53 mmHg Patient Gender: F                 HR:           94 bpm. Exam Location:  Inpatient Procedure: 2D Echo, Color Doppler and Cardiac Doppler Indications:     Post TAVR Evaluation  History:         Patient has prior history of Echocardiogram examinations, most                  recent 12/20/2019. CAD; Risk Factors:Hypertension, Diabetes and                  Dyslipidemia. 32mm CoreValve-Evolut Pro TAVR Bioprosthetic                  placed 12/20/19.                  Aortic Valve: CoreValve-Evolut Pro prosthetic, stented (TAVR)                  valve is present in the aortic position. Procedure Date:                  12/20/2019.  Sonographer:     Raquel Sarna Senior Referring Phys:  [id number] Woodfin Ganja Inanna Telford Diagnosing Phys: Sanda Klein MD IMPRESSIONS  1. A 3.5 m/s intracavitary "gradient" is present due to hyperdynamic left ventricular contraction. Left ventricular ejection fraction, by estimation, is 70 to 75%. The left ventricle has hyperdynamic function. The left ventricle has no regional wall motion abnormalities. Left ventricular diastolic parameters are consistent with Grade II  diastolic dysfunction (pseudonormalization). Elevated left atrial pressure.  2. Right ventricular systolic function is normal. The right ventricular size is normal. There is mildly elevated pulmonary artery systolic pressure.  3. The mitral valve is normal in structure. Mild to moderate mitral valve regurgitation. No evidence of mitral stenosis. Moderate mitral annular calcification.  4. The aortic valve has been repaired/replaced. Aortic valve regurgitation is not visualized. No aortic stenosis is present. There is a CoreValve-Evolut Pro prosthetic (TAVR) valve present in the aortic position. Procedure Date: 12/20/2019.  5. The inferior vena cava is normal in size with greater than 50% respiratory variability, suggesting right atrial pressure of 3 mmHg. FINDINGS  Left Ventricle: A 3.5 m/s intracavitary "gradient" is present due to hyperdynamic left ventricular contraction. Left ventricular ejection fraction, by estimation, is 70 to 75%. The left ventricle has hyperdynamic function. The left ventricle has no regional wall motion abnormalities. The left ventricular internal cavity size was normal in size. There is no left ventricular hypertrophy. Left ventricular diastolic parameters are consistent with Grade II diastolic dysfunction (pseudonormalization). Elevated left atrial pressure. Right Ventricle: The right ventricular size is normal. No increase in right ventricular wall thickness. Right ventricular systolic function is normal. There is mildly elevated pulmonary artery systolic pressure. The tricuspid regurgitant velocity is 2.91  m/s, and with an assumed right atrial pressure of 3 mmHg, the estimated right ventricular systolic pressure is 43.1 mmHg. Left Atrium: Left atrial size was normal in size. Right Atrium: Right atrial size was normal in size. Pericardium: There is no evidence of pericardial effusion. Mitral Valve: The mitral valve is normal in structure. Moderate mitral annular calcification. Mild to  moderate mitral valve regurgitation, with centrally-directed jet. No evidence of mitral valve stenosis. MV peak gradient, 16.2 mmHg. Tricuspid Valve: The tricuspid valve is normal in structure. Tricuspid valve regurgitation is trivial. No evidence of tricuspid stenosis. Aortic Valve: The aortic valve has been repaired/replaced. Aortic valve regurgitation is not visualized. No aortic stenosis is present. Aortic valve mean gradient measures 10.5 mmHg. Aortic valve peak gradient measures 19.3 mmHg. Aortic valve area, by VTI measures 2.29 cm. There is a CoreValve-Evolut Pro prosthetic, stented (TAVR) valve present in the aortic position. Procedure Date: 12/20/2019. Pulmonic Valve: The pulmonic valve was normal in structure. Pulmonic valve regurgitation is not visualized. No evidence of pulmonic stenosis. Aorta: The aortic root is normal in size and structure. Venous: The inferior vena cava is normal in size with greater than 50% respiratory variability, suggesting right atrial pressure of 3 mmHg. IAS/Shunts: No atrial level shunt detected by color flow Doppler.  LEFT VENTRICLE PLAX 2D LVIDd:         3.30 cm LVIDs:         2.10 cm LV PW:         1.00 cm LV IVS:        1.20 cm LVOT diam:     2.10 cm LV SV:         94 LV SV Index:   55 LVOT Area:     3.46 cm  RIGHT VENTRICLE RV S prime:     16.00 cm/s TAPSE (M-mode): 2.2 cm LEFT ATRIUM           Index LA diam:      3.60 cm 2.11 cm/m LA Vol (A2C): 52.8 ml 31.02 ml/m LA Vol (A4C): 63.9 ml 37.54 ml/m  AORTIC VALVE AV Area (Vmax):    2.08 cm AV Area (Vmean):   2.36 cm AV Area (VTI):     2.29 cm AV Vmax:           219.50 cm/s AV Vmean:          145.000 cm/s AV VTI:            0.411 m AV Peak Grad:  19.3 mmHg AV Mean Grad:      10.5 mmHg LVOT Vmax:         132.00 cm/s LVOT Vmean:        98.900 cm/s LVOT VTI:          0.272 m LVOT/AV VTI ratio: 0.66 MITRAL VALVE                TRICUSPID VALVE MV Area (PHT): 3.42 cm     TR Peak grad:   33.9 mmHg MV Area VTI:   2.42  cm     TR Vmax:        291.00 cm/s MV Peak grad:  16.2 mmHg MV Vmax:       2.01 m/s     SHUNTS MV Vmean:      127.0 cm/s   Systemic VTI:  0.27 m MV VTI:        0.39 m       Systemic Diam: 2.10 cm MV PHT:        79.00 msec MV Decel Time: 222 msec MV E velocity: 161.00 cm/s MV A velocity: 120.00 cm/s MV E/A ratio:  1.34 Mihai Croitoru MD Electronically signed by Sanda Klein MD Signature Date/Time: 12/21/2019/1:05:19 PM    Final    ECHOCARDIOGRAM LIMITED  Result Date: 12/20/2019    ECHOCARDIOGRAM LIMITED REPORT   Patient Name:   Cheryl Hernandez Date of Exam: 12/20/2019 Medical Rec #:  [id number]         Height:       63.5 in Accession #:    [id number]        Weight:       138.0 lb Date of Birth:  1940-08-15         BSA:          1.661 m Patient Age:    72 years          BP:           171/69 mmHg Patient Gender: F                 HR:           98 bpm. Exam Location:  Inpatient Procedure: Limited Echo, Limited Color Doppler, Cardiac Doppler and Echo            Assisted Procedure Indications:     TAVR Procedure; Aortic Stenosis  History:         Patient has prior history of Echocardiogram examinations, most                  recent 10/07/2019. Previous Myocardial Infarction and CAD; Risk                  Factors:Dyslipidemia, Diabetes and Hypertension.  Sonographer:     Mikki Santee RDCS (AE) Referring Phys:  [id number] MICHAEL COOPER Diagnosing Phys: Sanda Klein MD                   PRE-PROCEDURE FINDINGS                   Hyperdynamic left ventricular systolic function, estimated EF                  70%.                  Moderate to severe calcific aortic stenosis. No aortic  insufficiency.                  Peak aortic gradient 42 mm Hg, mean gradient 27 mm Hg,                  dimensionless index 0.35, calculated valve area 1.07 cm2.                  Mild mitral insufficiency.                  No pericardial effusion.                   POST-PROCEDURE FINDINGS                   Hyperdynamic left  ventricular systolic function, estimated EF                  >75%.                  Well seated Medtronic EvolutR stent valve (TAVR). No aortic                  insufficiency/perivalvular leak.                  Peak aortic gradient 11 mm Hg, mean gradient 6 mm Hg,                  dimensionless index 0.81, calculated valve area 2.4 cm2.                  Mild mitral insufficiency.                  No pericardial effusion. IMPRESSIONS  1. Left ventricular ejection fraction, by estimation, is 70 to 75%. The left ventricle has hyperdynamic function. The left ventricle has no regional wall motion abnormalities.  2. Right ventricular systolic function is hyperdynamic. The right ventricular size is normal.  3. The mitral valve is normal in structure. Mild mitral valve regurgitation.  4. Aortic valve regurgitation is not visualized. FINDINGS  Left Ventricle: Left ventricular ejection fraction, by estimation, is 70 to 75%. The left ventricle has hyperdynamic function. The left ventricle has no regional wall motion abnormalities. Right Ventricle: The right ventricular size is normal. No increase in right ventricular wall thickness. Right ventricular systolic function is hyperdynamic. Pericardium: There is no evidence of pericardial effusion. Mitral Valve: The mitral valve is normal in structure. Mild mitral valve regurgitation. Tricuspid Valve: The tricuspid valve is normal in structure. Tricuspid valve regurgitation is not demonstrated. Aortic Valve: Aortic valve regurgitation is not visualized. Aortic valve mean gradient measures 6.0 mmHg. Aortic valve peak gradient measures 10.9 mmHg. Aortic valve area, by VTI measures 2.43 cm. There is a 26 mm CoreValve-Evolut Pro prosthetic, stented (TAVR) valve present in the aortic position. Pulmonic Valve: The pulmonic valve was not assessed. Aorta: The aortic root and ascending aorta are structurally normal, with no evidence of dilitation. LEFT VENTRICLE PLAX 2D LVOT diam:     2.00  cm LV SV:         104 LV SV Index:   63 LVOT Area:     3.14 cm  AORTIC VALVE AV Area (Vmax):    2.55 cm AV Area (Vmean):   1.66 cm AV Area (VTI):     2.43 cm AV Vmax:           165.00 cm/s AV Vmean:  179.000 cm/s AV VTI:            0.429 m AV Peak Grad:      10.9 mmHg AV Mean Grad:      6.0 mmHg LVOT Vmax:         134.00 cm/s LVOT Vmean:        94.600 cm/s LVOT VTI:          0.332 m LVOT/AV VTI ratio: 0.77  SHUNTS Systemic VTI:  0.33 m Systemic Diam: 2.00 cm Sanda Klein MD Electronically signed by Sanda Klein MD Signature Date/Time: 12/20/2019/5:52:47 PM    Final    Structural Heart Procedure  Result Date: 12/20/2019 See surgical note for result.  Disposition   Pt is being discharged home today in good condition.  Follow-up Plans & Appointments     Follow-up Information    Eileen Stanford, PA-C. Go on 12/29/2019.   Specialties: Cardiology, Radiology Why: @ 1:30pm, please arrive at least 10 minutes early.  Contact information: [address] [place] [address] [phone]              Discharge Instructions    Face-to-face encounter (required for Medicare/Medicaid patients)   Complete by: As directed    I Angelena Form certify that this patient is under my care and that I, or a nurse practitioner or physician's assistant working with me, had a face-to-face encounter that meets the physician face-to-face encounter requirements with this patient on 12/22/2019. 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 and some confusion during admission   I certify that, based on my  findings, the following services are medically necessary home health services: Physical therapy   Reason for Medically Necessary Home Health Services: Skilled Nursing- Skilled Assessment/Observation   My clinical findings support the need for the above services: Unsafe ambulation due to balance issues   Further, I certify that my clinical findings support that this patient is homebound due to: Mental confusion   Home Health   Complete by: As directed    To provide the following care/treatments:  PT OT        Discharge Medications   Allergies as of 12/22/2019      Reactions   Cefuroxime Axetil Hives, Itching   Cephalexin Other (See Comments)   Pt. Doesn't remember reaction Total body pain in joints, couldn't swallow   Codeine    Tolerates Dilaudid.   Doxycycline    REACTION: vomiting/ GI upset   Erythromycin Base Other (See Comments)   unknown   Statins       Medication List    TAKE these medications   acetaminophen 500 MG tablet Commonly known as: TYLENOL Take 500 mg by mouth every 6 (six) hours as needed (for arthritis pain.).   albuterol 108 (90 Base) MCG/ACT inhaler Commonly known as: VENTOLIN HFA Inhale 2 puffs into the lungs every 6 (six) hours as needed for wheezing or shortness of breath.   Align 4 MG Caps Take 4 mg by mouth every other day.   aspirin EC 81 MG tablet Take 162 mg by mouth every evening.  B-complex with vitamin C tablet Take 1 tablet by mouth daily.   BEANO PO Take 1 tablet by mouth daily as needed (flatulence/abdominal bloating.).   budesonide 3 MG 24 hr capsule Commonly known as: ENTOCORT EC Take 9 mg by mouth 2 (two) times daily as needed (diarrhea).   budesonide-formoterol 80-4.5 MCG/ACT inhaler Commonly known as: Symbicort Inhale 2 puffs into the lungs 2 (two) times daily.   Bystolic 5 MG tablet Generic drug: nebivolol TAKE ONE TABLET BY MOUTH DAILY What changed: how much to take   calcium carbonate 500 MG chewable  tablet Commonly known as: TUMS - dosed in mg elemental calcium Chew 1 tablet by mouth 3 (three) times daily as needed for heartburn.   clopidogrel 75 MG tablet Commonly known as: PLAVIX Take 1 tablet (75 mg total) by mouth daily with breakfast.   fexofenadine 180 MG tablet Commonly known as: ALLEGRA Take 180 mg by mouth daily.   Flutter Devi 1 each every other day. Use flutter valve 1 times daily as needed   FREESTYLE LITE TEST VI Once daily   guaifenesin 400 MG Tabs tablet Commonly known as: HUMIBID E Take 400 mg by mouth daily.   Magnesium Citrate 125 MG Caps Take 125 mg by mouth daily.   metFORMIN 500 MG 24 hr tablet Commonly known as: GLUCOPHAGE-XR Take 500 mg by mouth daily.   multivitamin with minerals tablet Take 1 tablet by mouth every other day. In the morning   nitroGLYCERIN 0.4 MG SL tablet Commonly known as: NITROSTAT Place 1 tablet (0.4 mg total) under the tongue every 5 (five) minutes as needed for chest pain. What changed: when to take this   Omega-3 1000 MG Caps Take 1,000 mg by mouth every other day. In the morning.   OVER THE COUNTER MEDICATION Take 1 capsule by mouth every other day. Circulation and vein   PHOSPHATIDYL CHOLINE PO Take 1 capsule by mouth every other day. Jeannetta Nap Phosphatidyl Choline Liver &Brain   Repatha SureClick 638 MG/ML Soaj Generic drug: Evolocumab Inject 1 pen into the skin every 14 (fourteen) days.   temazepam 15 MG capsule Commonly known as: RESTORIL Take 15 mg by mouth at bedtime as needed for sleep.   Turmeric 400 MG Caps Take 400 mg by mouth every other day. In the morning.   vitamin B-12 1000 MCG tablet Commonly known as: CYANOCOBALAMIN Take 1,000 mcg by mouth every other day.   Vitamin D3 75 MCG (3000 UT) Tabs Take 3,000 Units by mouth every other day. In the morning        Outstanding Labs/Studies   none  Duration of Discharge Encounter   Greater than 30 minutes including physician  time.  SignedAngelena Form, PA-C 12/22/2019, 9:45 AM [phone]

## 2019-12-21 NOTE — Progress Notes (Signed)
Echocardiogram 2D Echocardiogram has been performed.  Oneal Deputy Hanya Guerin 12/21/2019, 10:15 AM

## 2019-12-21 NOTE — Progress Notes (Addendum)
[place] VALVE TEAM  Patient Name: Cheryl Hernandez Date of Encounter: 12/21/2019  Primary Cardiologist: Sinclair Grooms, MD / Dr. Burt Knack & Dr. Roxy Manns (TAVR)  Hospital Problem List     Principal Problem:   S/P TAVR (transcatheter aortic valve replacement) Active Problems:   Type 2 diabetes mellitus without complication, without long-term current use of insulin (HCC)   Hyperlipidemia   Essential hypertension   Paroxysmal tachycardia (HCC)   Asthma, moderate persistent   GERD, SEVERE   Obesity   Severe aortic stenosis   CAD (coronary artery disease)   Acute on chronic diastolic heart failure ([place])     Subjective   Has had a HA all night and some new visual changes. Cannot read monitor well.   Inpatient Medications    Scheduled Meds: . aspirin EC  162 mg Oral QPM  . clopidogrel  75 mg Oral Q breakfast  . insulin aspart  0-24 Units Subcutaneous TID AC & HS  . loratadine  10 mg Oral Daily  . mometasone-formoterol  2 puff Inhalation BID  . nebivolol  2.5 mg Oral Daily  . sodium chloride flush  3 mL Intravenous Q12H   Continuous Infusions: . sodium chloride    . levofloxacin (LEVAQUIN) IV    . nitroGLYCERIN    . phenylephrine (NEO-SYNEPHRINE) Adult infusion     PRN Meds: sodium chloride, acetaminophen **OR** acetaminophen, morphine injection, ondansetron (ZOFRAN) IV, oxyCODONE, sodium chloride flush, temazepam, traMADol   Vital Signs    Vitals:   12/20/19 2310 12/21/19 0235 12/21/19 0245 12/21/19 0721  BP: (!) 109/55  (!) 131/58   Pulse: 95  (!) 101   Resp: 16  19   Temp: 98.5 F (36.9 C)  99.3 F (37.4 C)   TempSrc: Oral  Axillary   SpO2: 95%  99% 98%  Weight:  66.3 kg    Height:        Intake/Output Summary (Last 24 hours) at 12/21/2019 0811 Last data filed at 12/21/2019 0500 Gross per 24 hour  Intake 2721.2 ml  Output --  Net 2721.2 ml   Filed Weights   12/20/19 0728 12/21/19 0235  Weight: 62.6 kg 66.3  kg    Physical Exam    GEN: Well nourished, well developed, in no acute distress.  HEENT: Grossly normal.  Neck: Supple, no JVD, carotid bruits, or masses. Cardiac: RRR, no murmurs, rubs, or gallops. No clubbing, cyanosis, edema.   Respiratory:  Respirations regular and unlabored, clear to auscultation bilaterally. GI: Soft, nontender, nondistended, BS + x 4. MS: no deformity or atrophy. Skin: warm and dry, no rash.  Groin sites clear without hematoma or ecchymosis  Neuro:  Strength and sensation are intact. Psych: AAOx3.  Normal affect.  Labs    CBC Recent Labs    12/20/19 1154 12/21/19 0403  WBC  --  13.3*  HGB 9.9* 11.0*  HCT 29.0* 34.8*  MCV  --  95.6  PLT  --  366   Basic Metabolic Panel Recent Labs    12/20/19 1154 12/21/19 0403  NA 140 135  K 4.0 4.2  CL 107 105  CO2  --  21*  GLUCOSE 111* 110*  BUN 15 16  CREATININE 0.80 1.04*  CALCIUM  --  8.9  MG  --  1.6*   Liver Function Tests No results for input(s): AST, ALT, ALKPHOS, BILITOT, PROT, ALBUMIN in the last 72 hours. No results for input(s): LIPASE, AMYLASE in the  last 72 hours. Cardiac Enzymes No results for input(s): CKTOTAL, CKMB, CKMBINDEX, TROPONINI in the last 72 hours. BNP Invalid input(s): POCBNP D-Dimer No results for input(s): DDIMER in the last 72 hours. Hemoglobin A1C No results for input(s): HGBA1C in the last 72 hours. Fasting Lipid Panel No results for input(s): CHOL, HDL, LDLCALC, TRIG, CHOLHDL, LDLDIRECT in the last 72 hours. Thyroid Function Tests No results for input(s): TSH, T4TOTAL, T3FREE, THYROIDAB in the last 72 hours.  Invalid input(s): FREET3  Telemetry    Sinus with PAC - Personally Reviewed  ECG    sinus - Personally Reviewed  Radiology    ECHOCARDIOGRAM LIMITED  Result Date: 12/20/2019    ECHOCARDIOGRAM LIMITED REPORT   Patient Name:   Cheryl Hernandez Date of Exam: 12/20/2019 Medical Rec #:  [id number]         Height:       63.5 in Accession #:     [id number]        Weight:       138.0 lb Date of Birth:  July 11, 1940         BSA:          1.661 m Patient Age:    79 years          BP:           171/69 mmHg Patient Gender: F                 HR:           98 bpm. Exam Location:  Inpatient Procedure: Limited Echo, Limited Color Doppler, Cardiac Doppler and Echo            Assisted Procedure Indications:     TAVR Procedure; Aortic Stenosis  History:         Patient has prior history of Echocardiogram examinations, most                  recent 10/07/2019. Previous Myocardial Infarction and CAD; Risk                  Factors:Dyslipidemia, Diabetes and Hypertension.  Sonographer:     Mikki Santee RDCS (AE) Referring Phys:  [id number] MICHAEL COOPER Diagnosing Phys: Sanda Klein MD                   PRE-PROCEDURE FINDINGS                   Hyperdynamic left ventricular systolic function, estimated EF                  70%.                  Moderate to severe calcific aortic stenosis. No aortic                  insufficiency.                  Peak aortic gradient 42 mm Hg, mean gradient 27 mm Hg,                  dimensionless index 0.35, calculated valve area 1.07 cm2.                  Mild mitral insufficiency.                  No pericardial effusion.  POST-PROCEDURE FINDINGS                   Hyperdynamic left ventricular systolic function, estimated EF                  >75%.                  Well seated Medtronic EvolutR stent valve (TAVR). No aortic                  insufficiency/perivalvular leak.                  Peak aortic gradient 11 mm Hg, mean gradient 6 mm Hg,                  dimensionless index 0.81, calculated valve area 2.4 cm2.                  Mild mitral insufficiency.                  No pericardial effusion. IMPRESSIONS  1. Left ventricular ejection fraction, by estimation, is 70 to 75%. The left ventricle has hyperdynamic function. The left ventricle has no regional wall motion abnormalities.  2. Right ventricular systolic function  is hyperdynamic. The right ventricular size is normal.  3. The mitral valve is normal in structure. Mild mitral valve regurgitation.  4. Aortic valve regurgitation is not visualized. FINDINGS  Left Ventricle: Left ventricular ejection fraction, by estimation, is 70 to 75%. The left ventricle has hyperdynamic function. The left ventricle has no regional wall motion abnormalities. Right Ventricle: The right ventricular size is normal. No increase in right ventricular wall thickness. Right ventricular systolic function is hyperdynamic. Pericardium: There is no evidence of pericardial effusion. Mitral Valve: The mitral valve is normal in structure. Mild mitral valve regurgitation. Tricuspid Valve: The tricuspid valve is normal in structure. Tricuspid valve regurgitation is not demonstrated. Aortic Valve: Aortic valve regurgitation is not visualized. Aortic valve mean gradient measures 6.0 mmHg. Aortic valve peak gradient measures 10.9 mmHg. Aortic valve area, by VTI measures 2.43 cm. There is a 26 mm CoreValve-Evolut Pro prosthetic, stented (TAVR) valve present in the aortic position. Pulmonic Valve: The pulmonic valve was not assessed. Aorta: The aortic root and ascending aorta are structurally normal, with no evidence of dilitation. LEFT VENTRICLE PLAX 2D LVOT diam:     2.00 cm LV SV:         104 LV SV Index:   63 LVOT Area:     3.14 cm  AORTIC VALVE AV Area (Vmax):    2.55 cm AV Area (Vmean):   1.66 cm AV Area (VTI):     2.43 cm AV Vmax:           165.00 cm/s AV Vmean:          179.000 cm/s AV VTI:            0.429 m AV Peak Grad:      10.9 mmHg AV Mean Grad:      6.0 mmHg LVOT Vmax:         134.00 cm/s LVOT Vmean:        94.600 cm/s LVOT VTI:          0.332 m LVOT/AV VTI ratio: 0.77  SHUNTS Systemic VTI:  0.33 m Systemic Diam: 2.00 cm Dani Gobble Croitoru MD Electronically signed by Sanda Klein MD Signature Date/Time: 12/20/2019/5:52:47 PM    Final    Structural  Heart Procedure  Result Date: 12/20/2019 See  surgical note for result.   Cardiac Studies   TAVR OPERATIVE NOTE   Date of Procedure:12/20/2019  Preoperative Diagnosis:Severe Aortic Stenosis   Postoperative Diagnosis:Same   Procedure:   Transcatheter Aortic Valve Replacement - PercutaneousRightTransfemoral Approach Medtronic CoreValve Evolut Pro (size 78mm, serial # [id number])  Co-Surgeons:Antowan Samford H. Roxy Manns, MD and Sherren Mocha, MD  Anesthesiologist:Charlene Nyoka Cowden, MD  Echocardiographer:Mihai Croitoru, MD  Pre-operative Echo Findings: ? Severe aortic stenosis ? Normalleft ventricular systolic function  Post-operative Echo Findings: ? Noparavalvular leak ? Normalleft ventricular systolic function  _____________   Echo 12/21/19: pending   Patient Profile     Lamyah A Driskill is a 79 y.o. female with a history of CAD s/p acute MI (2009), carotid artery disease (50-38% LICA), HTN, HLD, degenerative arthritis of the knees, GERD, paroxysmal VT, DMT2, and severe paradoxical LFLG AS who presented to [facility] on 12/[facility]19 for planned TAVR.   Assessment & Plan    Severe AS:s/p successful TAVR with a 26 mm Medtronic Evolut Pro + THV via the TF approach on 12/20/19. Post operative echo pending. Groin sites are stable. ECG with sinus tachy (HR 101) and no high grade heart block. Continue Asprin and started on plavix 75 mg daily.   New persistent HA and blurred vision: will get MR brain and MR angio to rule out CVA after TAVR. ( I discussed with valve manufacturer and radiology and okay to do 1 day after implant.)   Carotid artery disease: pre TAVR dopplers showed 88-28% LICA. Continue medical therapy.   HTN: BP stable. Resume home Bystolic 2.5 mg daily.    Acute on chronic diastolic CHF: as evidenced by an elevated BNP on pre admission lab work. This has been treated with TAVR.   DMT2: continue SSI      Signed, Angelena Form, PA-C  12/21/2019, 8:11 AM  Pager [phone]   I have seen and examined the patient and agree with the assessment and plan as outlined.  Looks good but complains of headache that lasted all night long.  Mild blurriness of vision in both eyes.  No other neurologic symptoms.  Will get MRI head to r/o stroke.  [name], MD 12/21/2019

## 2019-12-21 NOTE — Progress Notes (Signed)
CARDIAC REHAB PHASE I   Offered to walk with pt. Pt states she has walked this am, and has a headache now. RN aware. Will f/u later.  Rufina Falco, RN BSN 12/21/2019 8:36 AM

## 2019-12-21 NOTE — Progress Notes (Signed)
Mobility Specialist: Progress Note   12/21/19 1639  Mobility  Activity Contraindicated/medical hold   After consulting RN, not going to work with pt today as pt is waiting on neurological consult.   [facility] Novelle Addair Mobility Specialist

## 2019-12-22 ENCOUNTER — Inpatient Hospital Stay (INDEPENDENT_AMBULATORY_CARE_PROVIDER_SITE_OTHER): Payer: Medicare Other

## 2019-12-22 DIAGNOSIS — I350 Nonrheumatic aortic (valve) stenosis: Principal | ICD-10-CM

## 2019-12-22 DIAGNOSIS — E7800 Pure hypercholesterolemia, unspecified: Secondary | ICD-10-CM

## 2019-12-22 DIAGNOSIS — E119 Type 2 diabetes mellitus without complications: Secondary | ICD-10-CM

## 2019-12-22 DIAGNOSIS — Z952 Presence of prosthetic heart valve: Secondary | ICD-10-CM

## 2019-12-22 DIAGNOSIS — I471 Supraventricular tachycardia: Secondary | ICD-10-CM

## 2019-12-22 DIAGNOSIS — I6310 Cerebral infarction due to embolism of unspecified precerebral artery: Secondary | ICD-10-CM

## 2019-12-22 DIAGNOSIS — I10 Essential (primary) hypertension: Secondary | ICD-10-CM

## 2019-12-22 DIAGNOSIS — I6340 Cerebral infarction due to embolism of unspecified cerebral artery: Secondary | ICD-10-CM | POA: Insufficient documentation

## 2019-12-22 DIAGNOSIS — I5033 Acute on chronic diastolic (congestive) heart failure: Secondary | ICD-10-CM

## 2019-12-22 LAB — BASIC METABOLIC PANEL
Anion gap: 12 (ref 5–15)
BUN: 8 mg/dL (ref 8–23)
CO2: 22 mmol/L (ref 22–32)
Calcium: 9.4 mg/dL (ref 8.9–10.3)
Chloride: 104 mmol/L (ref 98–111)
Creatinine, Ser: 0.86 mg/dL (ref 0.44–1.00)
GFR calc Af Amer: >60 mL/min (ref >60)
GFR calc non Af Amer: >60 mL/min (ref >60)
Glucose, Bld: 176 mg/dL — ABNORMAL HIGH (ref 70–99)
Potassium: 3.6 mmol/L (ref 3.5–5.1)
Sodium: 138 mmol/L (ref 135–145)

## 2019-12-22 LAB — CBC
HCT: 35.7 % — ABNORMAL LOW (ref 36.0–46.0)
Hemoglobin: 11.3 g/dL — ABNORMAL LOW (ref 12.0–15.0)
MCH: 30.7 pg (ref 26.0–34.0)
MCHC: 31.7 g/dL (ref 30.0–36.0)
MCV: 97 fL (ref 80.0–100.0)
Platelets: 207 10*3/uL (ref 150–400)
RBC: 3.68 MIL/uL — ABNORMAL LOW (ref 3.87–5.11)
RDW: 14.7 % (ref 11.5–15.5)
WBC: 10.3 10*3/uL (ref 4.0–10.5)
nRBC: 0 % (ref 0.0–0.2)

## 2019-12-22 LAB — LIPID PANEL
Cholesterol: 157 mg/dL (ref 0–200)
HDL: 67 mg/dL (ref >40)
LDL Cholesterol: 60 mg/dL (ref 0–99)
Total CHOL/HDL Ratio: 2.3 RATIO
Triglycerides: 151 mg/dL — ABNORMAL HIGH (ref <150)
VLDL: 30 mg/dL (ref 0–40)

## 2019-12-22 LAB — GLUCOSE, CAPILLARY
Glucose-Capillary: 104 mg/dL — ABNORMAL HIGH (ref 70–99)
Glucose-Capillary: 110 mg/dL — ABNORMAL HIGH (ref 70–99)

## 2019-12-22 NOTE — TOC Transition Note (Signed)
Transition of Care (TOC) - CM/SW Discharge Note Marvetta Gibbons RN, BSN Transitions of Care Unit 4E- RN Case Manager See Treatment Team for direct phone #    Patient Details  Name: Cheryl Hernandez MRN: [id number] Date of Birth: Apr 23, 1940  Transition of Care [facility]) CM/SW Contact:  [name], RN Phone Number: 12/22/2019, 2:26 PM   Clinical Narrative:    Pt s/p TAVR and post op CVA, pt from home alone, sister plans to stay with her post discharge. Orders written for HHPT/OT.  Pt was discharge prior to [facility] being able to see pt- call made to pt at home to discuss [facility] needs. Per pt her sister is staying with her. Discussed orders for PT/OT and what [facility] services would do in the home for therapy. Pt states she plans to f/u with outpt cardiac rehab and declines [facility] services at this time. Explained to her that should she change her mind she can f/u with her PCP or surgeon for [facility] referral and orders. Pt voiced understanding however politely stated that she just wanted to stay with outpt therapy.  No HH referral done at this time- pt declined.    Final next level of care: [place] Barriers to Discharge: No Barriers Identified   Patient Goals and CMS Choice Patient states their goals for this hospitalization and ongoing recovery are:: to be independent CMS Medicare.gov Compare Post Acute Care list provided to:: Patient Choice offered to / list presented to : Patient  Discharge Placement               Home        Discharge Plan and Services   Discharge Planning Services: CM Consult Post Acute Care Choice: Home Health          DME Arranged: N/A DME Agency: NA       HH Arranged: PT, OT, Patient Refused HH          Social Determinants of Health (SDOH) Interventions     Readmission Risk Interventions No flowsheet data found.

## 2019-12-22 NOTE — Progress Notes (Signed)
Central Monitoring called me at 01:32 to tell me Patient had a short run of S.V.T, 12 beats rate 150-160 then back to S.R. on 12-21-2019 at 22:30

## 2019-12-22 NOTE — Evaluation (Signed)
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 PMhx: HTN, DM, HLD, asthma, CAD, HF, AS  Clinical Impression  Pt standing beside bed on arrival with pt reporting frustration over not having free rain of mobility acutely and demonstrating lack of insight for concern for falls given her sx and CVA. Pt dismissive of education regarding her balance deficits stating she was veering and bouncing off things at times at home. Pt with balance, vision and safety deficits who will benefit from acute therapy to maximize mobility and safety with OPPT recommended if pt has available transportation vs HHPT. Pt very focused on D/C and not fully receptive to the idea of further therapy needs, RN aware. Will follow acutely to maximize safety and balance.   HR 95-124 with gait with pt able to walk 450' in grossly 6 min with pt with 2/4 DOE and SpO2 93% on RA    Follow Up Recommendations Outpatient PT;Supervision for mobility/OOB (initial supervision)    Equipment Recommendations  None recommended by PT    Recommendations for Other Services OT consult     Precautions / Restrictions Precautions Precautions: Fall Restrictions Weight Bearing Restrictions: No      Mobility  Bed Mobility Overal bed mobility: Modified Independent             General bed mobility comments: pt standing EOB on arrival and sitting end of session  Transfers Overall transfer level: Needs assistance Equipment used: None Transfers: Sit to/from Stand Sit to Stand: Supervision Stand pivot transfers: Min guard       General transfer comment: Pt utilized furniture for support, consistently but does not use RW which was within reach  Ambulation/Gait Ambulation/Gait assistance: Min guard Gait Distance (Feet): 450  Feet Assistive device: None Gait Pattern/deviations: Step-through pattern;Decreased stride length;Drifts right/left   Gait velocity interpretation: >2.62 ft/sec, indicative of community ambulatory General Gait Details: pt with continued drift and veer toward right with gait with assist of therapist x 1 for balance correction and bumping into objects x 3  Stairs Stairs: Yes Stairs assistance: Min guard Stair Management: Step to pattern;Forwards;One rail Left Number of Stairs: 5    Wheelchair Mobility    Modified Rankin (Stroke Patients Only)       Balance Overall balance assessment: Needs assistance Sitting-balance support: Feet supported Sitting balance-Leahy Scale: Good Sitting balance - Comments: able to demonstrate dynamic balance activities in sitting without LOB   Standing balance support: Single extremity supported Standing balance-Leahy Scale: Good Standing balance comment: pt able to stand and ambulate without UE support with noted balance deficits with movement               High Level Balance Comments: pt with increased veering with horizontal head turns as well as vertical, good transition with gait speed changes             Pertinent Vitals/Pain Pain Assessment: No/denies pain    Home Living Family/patient expects to be discharged to:: Private residence Living Arrangements: Alone Available Help at Discharge: Family;Available 24 hours/day Type of Home: House Home Access: Stairs to enter Entrance Stairs-Rails: Left Entrance Stairs-Number of Steps: 5 Home Layout: One level Home Equipment: Cane - single point Additional Comments: Pt former Therapist, 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    Prior Function Level of Independence: Independent  Hand Dominance        Extremity/Trunk Assessment   Upper Extremity Assessment Upper Extremity Assessment: Overall WFL for tasks assessed    Lower Extremity Assessment Lower Extremity  Assessment: Overall WFL for tasks assessed    Cervical / Trunk Assessment Cervical / Trunk Assessment: Normal  Communication   Communication: No difficulties (some word finding trouble)  Cognition Arousal/Alertness: Awake/alert Behavior During Therapy: WFL for tasks assessed/performed Overall Cognitive Status: No family/caregiver present to determine baseline cognitive functioning Area of Impairment: Safety/judgement;Awareness                         Safety/Judgement: Decreased awareness of safety;Decreased awareness of deficits Awareness: Emergent   General Comments: pt frustrated with staff telling her to have assist for mobility. Pt with higher level balance deficits and even after education see no harm or fear of falling with her deficits      General Comments      Exercises     Assessment/Plan    PT Assessment Patient needs continued PT services  PT Problem List Decreased activity tolerance;Decreased balance;Decreased knowledge of use of DME;Decreased cognition       PT Treatment Interventions DME instruction;Therapeutic exercise;Gait training;Balance training;Functional mobility training;Therapeutic activities;Cognitive remediation;Patient/family education    PT Goals (Current goals can be found in the Care Plan section)  Acute Rehab PT Goals Patient Stated Goal: to go home today PT Goal Formulation: With patient Time For Goal Achievement: 12/29/19 Potential to Achieve Goals: Fair    Frequency Min 3X/week   Barriers to discharge Decreased caregiver support      Co-evaluation               AM-PAC PT "6 Clicks" Mobility  Outcome Measure Help needed turning from your back to your side while in a flat bed without using bedrails?: None Help needed moving from lying on your back to sitting on the side of a flat bed without using bedrails?: None Help needed moving to and from a bed to a chair (including a wheelchair)?: A Little Help needed standing  up from a chair using your arms (e.g., wheelchair or bedside chair)?: A Little Help needed to walk in hospital room?: A Little Help needed climbing 3-5 steps with a railing? : A Little 6 Click Score: 20    End of Session   Activity Tolerance: Patient tolerated treatment well Patient left: in bed;with call bell/phone within reach (sitting EOB with pt adamantly refusing bed alarm) Nurse Communication: Mobility status PT Visit Diagnosis: Other abnormalities of gait and mobility (R26.89);Other symptoms and signs involving the nervous system (R29.898)    Time: 4315-4008 PT Time Calculation (min) (ACUTE ONLY): 16 min   Charges:   PT Evaluation $PT Eval Moderate Complexity: [place], PT Acute Rehabilitation Services Pager: [phone] Office: [phone]   Sandy Salaam Elisandro Jarrett 12/22/2019, 12:26 PM

## 2019-12-22 NOTE — Progress Notes (Signed)
STROKE TEAM PROGRESS NOTE   INTERVAL HISTORY No family at the bedside. Pt sitting at the edge of the bed, she is waiting to be discharged. She still complains of blurry vision, no significant improvement from yesterday per pt. However, her HA has resolved. 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   Vitals:   12/22/19 0411 12/22/19 0449 12/22/19 0725 12/22/19 0747  BP: (!) 146/53  126/60   Pulse: (!) 109  90   Resp: 19  18   Temp: 98.8 F (37.1 C)  98.3 F (36.8 C)   TempSrc: Oral  Oral   SpO2: 98%  97% 97%  Weight:  64.6 kg    Height:       CBC:  Recent Labs  Lab 12/16/19 1133 12/20/19 1001 12/20/19 1154 12/21/19 0403  WBC 12.6*  --   --  13.3*  HGB 12.4   < > 9.9* 11.0*  HCT 39.3   < > 29.0* 34.8*  MCV 94.9  --   --  95.6  PLT 258  --   --  193   < > = values in this interval not displayed.   Basic Metabolic Panel:  Recent Labs  Lab 12/16/19 1133 12/20/19 1001 12/20/19 1154 12/21/19 0403  NA 136   < > 140 135  K 4.4   < > 4.0 4.2  CL 104   < > 107 105  CO2 22  --   --  21*  GLUCOSE 104*   < > 111* 110*  BUN 14   < > 15 16  CREATININE 0.72   < > 0.80 1.04*  CALCIUM 9.4  --   --  8.9  MG  --   --   --  1.6*   < > = values in this interval not displayed.   Lipid Panel:  Recent Labs  Lab 12/22/19 0841  CHOL 157  TRIG 151*  HDL 67  CHOLHDL 2.3  VLDL 30  LDLCALC 60   HgbA1c:  Recent Labs  Lab 12/16/19 1134  HGBA1C 6.0*   Urine Drug Screen: No results for input(s): LABOPIA, COCAINSCRNUR, LABBENZ, AMPHETMU, THCU, LABBARB in the last 168 hours.  Alcohol Level No results for input(s): ETH in the last 168 hours.  IMAGING past 24 hours CT ANGIO HEAD W OR WO CONTRAST  Result Date: 12/21/2019 CLINICAL DATA:  Stroke EXAM: CT ANGIOGRAPHY HEAD AND NECK TECHNIQUE: Multidetector CT imaging of the head and neck was performed using the standard protocol during bolus  administration of intravenous contrast. Multiplanar CT image reconstructions and MIPs were obtained to evaluate the vascular anatomy. Carotid stenosis measurements (when applicable) are obtained utilizing NASCET criteria, using the distal internal carotid diameter as the denominator. CONTRAST:  40mL OMNIPAQUE IOHEXOL 350 MG/ML SOLN COMPARISON:  MRI head 12/21/2019 FINDINGS: CT HEAD FINDINGS Brain: Hypodensity right inferior occipital lobe compatible with acute infarct. No hemorrhage. Small areas of acute infarct in the left superior cerebellum and right lateral thalamus also noted on MRI. Mild atrophy without hydrocephalus. Chronic microvascular ischemic changes in the white matter. Negative for hemorrhage or mass. Vascular: Negative for hyperdense vessel Skull: Negative Sinuses: Sinus mucosal disease. Probable air-fluid levels in the maxillary sinus bilaterally. Negative orbit. Orbits: Negative orbit Review of the MIP images confirms the above findings CTA NECK FINDINGS Aortic arch: Atherosclerotic calcification in the aortic arch and proximal great vessels. No significant stenosis.  Right carotid system: Atherosclerotic calcification right carotid bifurcation. 25% diameter stenosis proximal right internal carotid artery. Left carotid system: Atherosclerotic calcification left carotid bifurcation with approximately 25% diameter stenosis left internal carotid artery. Vertebral arteries: Both vertebral arteries patent to the basilar without significant stenosis. Skeleton: Advanced cervical spondylosis with disc and facet degeneration and 3 mm anterolisthesis C4-5. 2 mm anterolisthesis C5-6. Negative for fracture or mass lesion. Other neck: Negative for mass or adenopathy in the neck. Upper chest: Lung apices clear bilaterally. Review of the MIP images confirms the above findings CTA HEAD FINDINGS Anterior circulation: Atherosclerotic calcification throughout the cavernous carotid bilaterally without significant  stenosis. Anterior and middle cerebral arteries patent bilaterally without significant stenosis. Posterior circulation: Both vertebral arteries patent to the basilar. PICA patent bilaterally. Basilar widely patent. Superior cerebellar arteries patent bilaterally. Fetal origin left posterior cerebral artery is patent. Proximal right posterior cerebral artery is patent. There is a probable stenosis in the right P3 segment. Venous sinuses: Normal venous enhancement. Anatomic variants: None Review of the MIP images confirms the above findings IMPRESSION: 1. Acute infarct in the right PCA territory involving the inferior right occipital lobe and right lateral thalamus. Additional small area of acute infarct in the left superior cerebellum. No intracranial hemorrhage. 2. Atherosclerotic calcification of the carotid bifurcation with 25% diameter stenosis of the internal carotid artery bilaterally 3. Both vertebral arteries are patent to the basilar without stenosis 4. Atherosclerotic calcification in the cavernous carotid bilaterally. No significant stenosis in the anterior circulation. 5. Probable stenosis right P3 segment, not well visualized due to small size of vessel. No large vessel occlusion. Electronically Signed   By: Franchot Gallo M.D.   On: 12/21/2019 21:06   CT ANGIO NECK W OR WO CONTRAST  Result Date: 12/21/2019 CLINICAL DATA:  Stroke EXAM: CT ANGIOGRAPHY HEAD AND NECK TECHNIQUE: Multidetector CT imaging of the head and neck was performed using the standard protocol during bolus administration of intravenous contrast. Multiplanar CT image reconstructions and MIPs were obtained to evaluate the vascular anatomy. Carotid stenosis measurements (when applicable) are obtained utilizing NASCET criteria, using the distal internal carotid diameter as the denominator. CONTRAST:  28mL OMNIPAQUE IOHEXOL 350 MG/ML SOLN COMPARISON:  MRI head 12/21/2019 FINDINGS: CT HEAD FINDINGS Brain: Hypodensity right inferior  occipital lobe compatible with acute infarct. No hemorrhage. Small areas of acute infarct in the left superior cerebellum and right lateral thalamus also noted on MRI. Mild atrophy without hydrocephalus. Chronic microvascular ischemic changes in the white matter. Negative for hemorrhage or mass. Vascular: Negative for hyperdense vessel Skull: Negative Sinuses: Sinus mucosal disease. Probable air-fluid levels in the maxillary sinus bilaterally. Negative orbit. Orbits: Negative orbit Review of the MIP images confirms the above findings CTA NECK FINDINGS Aortic arch: Atherosclerotic calcification in the aortic arch and proximal great vessels. No significant stenosis. Right carotid system: Atherosclerotic calcification right carotid bifurcation. 25% diameter stenosis proximal right internal carotid artery. Left carotid system: Atherosclerotic calcification left carotid bifurcation with approximately 25% diameter stenosis left internal carotid artery. Vertebral arteries: Both vertebral arteries patent to the basilar without significant stenosis. Skeleton: Advanced cervical spondylosis with disc and facet degeneration and 3 mm anterolisthesis C4-5. 2 mm anterolisthesis C5-6. Negative for fracture or mass lesion. Other neck: Negative for mass or adenopathy in the neck. Upper chest: Lung apices clear bilaterally. Review of the MIP images confirms the above findings CTA HEAD FINDINGS Anterior circulation: Atherosclerotic calcification throughout the cavernous carotid bilaterally without significant stenosis. Anterior and middle cerebral arteries patent bilaterally without  significant stenosis. Posterior circulation: Both vertebral arteries patent to the basilar. PICA patent bilaterally. Basilar widely patent. Superior cerebellar arteries patent bilaterally. Fetal origin left posterior cerebral artery is patent. Proximal right posterior cerebral artery is patent. There is a probable stenosis in the right P3 segment. Venous  sinuses: Normal venous enhancement. Anatomic variants: None Review of the MIP images confirms the above findings IMPRESSION: 1. Acute infarct in the right PCA territory involving the inferior right occipital lobe and right lateral thalamus. Additional small area of acute infarct in the left superior cerebellum. No intracranial hemorrhage. 2. Atherosclerotic calcification of the carotid bifurcation with 25% diameter stenosis of the internal carotid artery bilaterally 3. Both vertebral arteries are patent to the basilar without stenosis 4. Atherosclerotic calcification in the cavernous carotid bilaterally. No significant stenosis in the anterior circulation. 5. Probable stenosis right P3 segment, not well visualized due to small size of vessel. No large vessel occlusion. Electronically Signed   By: Franchot Gallo M.D.   On: 12/21/2019 21:06   MR ANGIO HEAD WO CONTRAST  Result Date: 12/21/2019 CLINICAL DATA:  79 year old female with new or increased frequency of headache. Nausea and vomiting onset this morning. EXAM: MRA HEAD WITHOUT CONTRAST TECHNIQUE: Angiographic images of the Circle of Willis were obtained using MRA technique without intravenous contrast. COMPARISON:  Brain MRI today reported separately. FINDINGS: Antegrade flow in the posterior circulation with patent codominant distal vertebral arteries. Patent bilateral PICA origins and vertebrobasilar junction. Patent basilar artery, SCA and right PCA origin. There is a fetal type left PCA origin. Bilateral PCA branches remain within normal limits. No posterior circulation stenosis identified. Antegrade flow in both ICA siphons. There is mild left and up to moderate right supraclinoid ICA stenosis. 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 Small infundibulum of the distal right ICA also suspected. Patent carotid termini, MCA and ACA origins. There is moderate stenosis at the left ACA  origin. The right ACA is mildly dominant throughout. MCA M1 segments and MCA bi/trifurcations are patent without stenosis. Visible bilateral MCA branches are within normal limits. IMPRESSION: 1. Negative for large vessel occlusion, negative posterior circulation. 2. Positive for up to moderate stenosis of the ICA siphons (greater on the right) and the left ACA origin. 3. 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 Electronically Signed   By: Genevie Ann M.D.   On: 12/21/2019 14:59   MR BRAIN WO CONTRAST  Result Date: 12/21/2019 CLINICAL DATA:  79 year old female with new or increased frequency of headache. Nausea and vomiting onset this morning. EXAM: MRI HEAD WITHOUT CONTRAST TECHNIQUE: Multiplanar, multiecho pulse sequences of the brain and surrounding structures were obtained without intravenous contrast. COMPARISON:  Paranasal sinus CT 02/06/2005 FINDINGS: The examination was discontinued prior to completion by patient request. Sagittal T1 weighted imaging, axial T2 weighted imaging, axial and coronal DWI imaging only was obtained. Major intracranial vascular flow voids are preserved. Areas of restricted diffusion are scattered in the left cerebellum, bilateral occipital lobes (confluent on the left), posterior bilateral parietal lobes, right thalamus, and possibly also the posterior temporal lobes. The largest area is in the medial right occipital lobe encompassing about 4 cm. The brainstem appears spared. Additionally however, there are 2 subtle areas of anterior circulation restricted diffusion suspected in the superior frontal lobes on series 4, images 43 and 45. The remaining anterior circulation appear spared. Associated T2 hyperintensity in the affected areas compatible with cytotoxic edema.  No intracranial mass effect and no strong evidence of acute hemorrhage on this limited exam. Normal basilar cisterns. Negative pituitary and cervicomedullary junction. Degenerative changes in the visible  cervical spine including ligamentous hypertrophy about the odontoid. Visualized bone marrow signal is within normal limits. Fluid levels in the paranasal sinuses. The visible pharynx and mastoids are pneumatized. Negative orbits. IMPRESSION: 1. Limited exam, discontinued early by patient request. 2. Positive for multifocal acute infarcts - predominantly in the bilateral posterior circulation although there is trace anterior circulation involvement also. Therefore, consider a recent embolic event. The most confluent infarct is in the Right PCA territory. 3. No associated intracranial hemorrhage or mass effect. 4. Fluid levels in the bilateral paranasal sinuses might indicate acute sinusitis. Electronically Signed   By: Genevie Ann M.D.   On: 12/21/2019 14:55   ECHOCARDIOGRAM COMPLETE  Result Date: 12/21/2019    ECHOCARDIOGRAM REPORT   Patient Name:   Cheryl Hernandez Date of Exam: 12/21/2019 Medical Rec #:  [id number]         Height:       63.5 in Accession #:    [id number]        Weight:       146.2 lb Date of Birth:  1940/05/11         BSA:          1.702 m Patient Age:    43 years          BP:           121/53 mmHg Patient Gender: F                 HR:           94 bpm. Exam Location:  Inpatient Procedure: 2D Echo, Color Doppler and Cardiac Doppler Indications:     Post TAVR Evaluation  History:         Patient has prior history of Echocardiogram examinations, most                  recent 12/20/2019. CAD; Risk Factors:Hypertension, Diabetes and                  Dyslipidemia. 42mm CoreValve-Evolut Pro TAVR Bioprosthetic                  placed 12/20/19.                  Aortic Valve: CoreValve-Evolut Pro prosthetic, stented (TAVR)                  valve is present in the aortic position. Procedure Date:                  12/20/2019.  Sonographer:     Raquel Sarna Senior Referring Phys:  [id number] Woodfin Ganja THOMPSON Diagnosing Phys: Sanda Klein MD IMPRESSIONS  1. A 3.5 m/s intracavitary "gradient" is present due to  hyperdynamic left ventricular contraction. Left ventricular ejection fraction, by estimation, is 70 to 75%. The left ventricle has hyperdynamic function. The left ventricle has no regional wall motion abnormalities. Left ventricular diastolic parameters are consistent with Grade II diastolic dysfunction (pseudonormalization). Elevated left atrial pressure.  2. Right ventricular systolic function is normal. The right ventricular size is normal. There is mildly elevated pulmonary artery systolic pressure.  3. The mitral valve is normal in structure. Mild to moderate mitral valve regurgitation. No evidence of mitral stenosis. Moderate mitral annular calcification.  4. The aortic valve has been repaired/replaced.  Aortic valve regurgitation is not visualized. No aortic stenosis is present. There is a CoreValve-Evolut Pro prosthetic (TAVR) valve present in the aortic position. Procedure Date: 12/20/2019.  5. The inferior vena cava is normal in size with greater than 50% respiratory variability, suggesting right atrial pressure of 3 mmHg. FINDINGS  Left Ventricle: A 3.5 m/s intracavitary "gradient" is present due to hyperdynamic left ventricular contraction. Left ventricular ejection fraction, by estimation, is 70 to 75%. The left ventricle has hyperdynamic function. The left ventricle has no regional wall motion abnormalities. The left ventricular internal cavity size was normal in size. There is no left ventricular hypertrophy. Left ventricular diastolic parameters are consistent with Grade II diastolic dysfunction (pseudonormalization). Elevated left atrial pressure. Right Ventricle: The right ventricular size is normal. No increase in right ventricular wall thickness. Right ventricular systolic function is normal. There is mildly elevated pulmonary artery systolic pressure. The tricuspid regurgitant velocity is 2.91  m/s, and with an assumed right atrial pressure of 3 mmHg, the estimated right ventricular systolic  pressure is 78.9 mmHg. Left Atrium: Left atrial size was normal in size. Right Atrium: Right atrial size was normal in size. Pericardium: There is no evidence of pericardial effusion. Mitral Valve: The mitral valve is normal in structure. Moderate mitral annular calcification. Mild to moderate mitral valve regurgitation, with centrally-directed jet. No evidence of mitral valve stenosis. MV peak gradient, 16.2 mmHg. Tricuspid Valve: The tricuspid valve is normal in structure. Tricuspid valve regurgitation is trivial. No evidence of tricuspid stenosis. Aortic Valve: The aortic valve has been repaired/replaced. Aortic valve regurgitation is not visualized. No aortic stenosis is present. Aortic valve mean gradient measures 10.5 mmHg. Aortic valve peak gradient measures 19.3 mmHg. Aortic valve area, by VTI measures 2.29 cm. There is a CoreValve-Evolut Pro prosthetic, stented (TAVR) valve present in the aortic position. Procedure Date: 12/20/2019. Pulmonic Valve: The pulmonic valve was normal in structure. Pulmonic valve regurgitation is not visualized. No evidence of pulmonic stenosis. Aorta: The aortic root is normal in size and structure. Venous: The inferior vena cava is normal in size with greater than 50% respiratory variability, suggesting right atrial pressure of 3 mmHg. IAS/Shunts: No atrial level shunt detected by color flow Doppler.  LEFT VENTRICLE PLAX 2D LVIDd:         3.30 cm LVIDs:         2.10 cm LV PW:         1.00 cm LV IVS:        1.20 cm LVOT diam:     2.10 cm LV SV:         94 LV SV Index:   55 LVOT Area:     3.46 cm  RIGHT VENTRICLE RV S prime:     16.00 cm/s TAPSE (M-mode): 2.2 cm LEFT ATRIUM           Index LA diam:      3.60 cm 2.11 cm/m LA Vol (A2C): 52.8 ml 31.02 ml/m LA Vol (A4C): 63.9 ml 37.54 ml/m  AORTIC VALVE AV Area (Vmax):    2.08 cm AV Area (Vmean):   2.36 cm AV Area (VTI):     2.29 cm AV Vmax:           219.50 cm/s AV Vmean:          145.000 cm/s AV VTI:            0.411 m AV  Peak Grad:      19.3 mmHg AV Mean Grad:  10.5 mmHg LVOT Vmax:         132.00 cm/s LVOT Vmean:        98.900 cm/s LVOT VTI:          0.272 m LVOT/AV VTI ratio: 0.66 MITRAL VALVE                TRICUSPID VALVE MV Area (PHT): 3.42 cm     TR Peak grad:   33.9 mmHg MV Area VTI:   2.42 cm     TR Vmax:        291.00 cm/s MV Peak grad:  16.2 mmHg MV Vmax:       2.01 m/s     SHUNTS MV Vmean:      127.0 cm/s   Systemic VTI:  0.27 m MV VTI:        0.39 m       Systemic Diam: 2.10 cm MV PHT:        79.00 msec MV Decel Time: 222 msec MV E velocity: 161.00 cm/s MV A velocity: 120.00 cm/s MV E/A ratio:  1.34 Mihai Croitoru MD Electronically signed by Sanda Klein MD Signature Date/Time: 12/21/2019/1:05:19 PM    Final     PHYSICAL EXAM  Temp:  [98.3 F (36.8 C)-98.8 F (37.1 C)] 98.3 F (36.8 C) (09/23 0725) Pulse Rate:  [79-109] 90 (09/23 0725) Resp:  [16-19] 18 (09/23 0725) BP: (120-146)/(45-62) 126/60 (09/23 0725) SpO2:  [94 %-98 %] 97 % (09/23 0747) Weight:  [64.6 kg] 64.6 kg (09/23 0449)  General - Well nourished, well developed, in no apparent distress.  Ophthalmologic - fundi not visualized due to noncooperation.  Cardiovascular - Regular rhythm and rate.  Mental Status -  Level of arousal and orientation to time, place, and person were intact. Language including expression, naming, repetition, comprehension was assessed and found intact. Fund of Knowledge was assessed and was intact.  Cranial Nerves II - XII - II - Visual field intact on the right, however, left upper quadrant able to see shadow but not able to count fingers. III, IV, VI - Extraocular movements intact. V - Facial sensation intact bilaterally. VII - Facial movement intact bilaterally. VIII - Hearing & vestibular intact bilaterally. X - Palate elevates symmetrically. XI - Chin turning & shoulder shrug intact bilaterally. XII - Tongue protrusion intact.  Motor Strength - The patient's strength was normal in all  extremities and pronator drift was absent.  Bulk was normal and fasciculations were absent.   Motor Tone - Muscle tone was assessed at the neck and appendages and was normal.  Reflexes - The patient's reflexes were symmetrical in all extremities and she had no pathological reflexes.  Sensory - Light touch, temperature/pinprick were assessed and were symmetrical.    Coordination - The patient had normal movements in the hands and feet with no ataxia or dysmetria.  Tremor was absent.  Gait and Station - deferred.   ASSESSMENT/PLAN Cheryl Hernandez is a 79 y.o. 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 MRI showed multifocal infarcts.   Stroke:   Multiple B infarcts, predominantly in the posterior circulation embolic likely as sequela of recent TAVR procedure   MRI  Multifocal infarcts, most in B posterior circulation thought a few in anterior circulation as well.    MRA  No LVO. R>L ICA  siphon moderate stenosis. 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 B ICA 25% stenosis. B cavernous ICA atherosclerosis. Probable R P3 stenosis.  2D Echo EF 70-75%. TAVR in place. No source of embolus   Pre-TAVR dopplers w/ L ICA 40-59% stenosis   Recommend Ziopatch to rule out afib again (2016 30 day monitoring neg)  LDL 60   HgbA1c 6.0  VTE prophylaxis - SCDs   Post TAVR on clopidogrel 75 mg daily and aspirin 162mg  daily. Continue DAPT at d/c per primary team  Therapy recommendations:  pending  Disposition:  Return home  Advised not to drive at this time. Follow up w/ ophthalmology [place] for d/c from stroke standpoint Follow-up Stroke Clinic at [facility] in 4 weeks. Office will call  with appointment date and time. Order placed.  Hypertension  Stable . Long-term BP goal normotensive  Hyperlipidemia  Home meds:  Repatha (intolerant to statins)  Last LDL 64 in April, LDL today 60, goal < 70  Continue Repatha at discharge  Diabetes type II Controlled  HgbA1c 6.0, goal < 7.0  CBG monitoring  SSI  PCP follow up  Other Stroke Risk Factors  Advanced age  Cigarette smoker, quit 21 yrs ago  ETOH use, advised to drink no more than 1 drink(s) a day  Coronary artery disease, hx MI, angioplasty w/ stent  Migraines in her 22s w/ "episodes" every few months with patch of vision loss but no HA which last about 1 hr  Severe AS s/p TAVR 12/20/2019  Acute on chronic diastolic CHF  Other [facility] day # 2  Neurology will sign off. Please call with questions. Pt will follow up with stroke clinic NP at [facility] in about 4 weeks. Thanks for the consult.  Rosalin Hawking, MD PhD Stroke Neurology 12/22/2019 11:07 AM    To contact Stroke Continuity provider, please refer to [email or web address] After hours, contact General Neurology

## 2019-12-22 NOTE — Evaluation (Signed)
Speech Language Pathology Evaluation Patient Details Name: Cheryl Hernandez MRN: [id number] DOB: 29-Oct-1940 Today's Date: 12/22/2019 Time: 1103-     Problem List:  Patient Active Problem List   Diagnosis Date Noted  . Cerebral embolism with cerebral infarction 12/22/2019  . S/P TAVR (transcatheter aortic valve replacement) 12/20/2019  . Acute on chronic diastolic heart failure ([place]) 12/20/2019  . CAD (coronary artery disease)   . Osteoarthritis of knee 10/15/2017  . Allergic rhinitis 09/19/2015  . Obesity   . Coronary artery disease involving native coronary artery of native heart with angina pectoris ([place])   . Severe aortic stenosis   . DIVERTICULOSIS OF COLON 11/13/2009  . CONSTIPATION, CHRONIC 11/13/2009  . OSTEOPENIA 11/13/2009  . MENIERE'S DISEASE, HX OF 11/13/2009  . COLONIC POLYPS, HX OF 11/13/2009  . Asthma, moderate persistent 11/23/2007  . Paroxysmal tachycardia ([place]) 09/30/2007  . GERD, SEVERE 09/15/2007  . Type 2 diabetes mellitus without complication, without long-term current use of insulin ([place]) 09/14/2007  . Hyperlipidemia 09/13/2007  . Essential hypertension 09/13/2007   Past Medical History:  Past Medical History:  Diagnosis Date  . Arthritis   . Asthma   . CAD (coronary artery disease)    a. NSTEMI 2009 s/p DES to RCAx2.  . Carotid artery disease ([place])    a. Mild-moderate - followed by [facility].  . Chronic bronchitis ([place])    per patient  . Chronic diarrhea   . Diabetes mellitus without complication ([place])   . Diverticulosis   . GERD (gastroesophageal reflux disease)   . HTN (hypertension)   . Hx of migraines    as a child  . Hyperlipidemia   . Myocardial infarction [facility])    per patient "about 2011"  . Neuropathy    per patient in feet, legs, and some in hands  . Obesity   . Paroxysmal ventricular tachycardia ([place])   . Rhinitis   . Rotator cuff tear   . S/P TAVR (transcatheter aortic valve replacement) 12/20/2019   s/p TAVR with a 26 mm  Medtronic Evolut Pro + via the TF approach by Dr. Burt Knack and Dr. Roxy Manns   . Severe aortic stenosis    Past Surgical History:  Past Surgical History:  Procedure Laterality Date  . ANGIOPLASTY    . CORONARY ANGIOPLASTY WITH STENT PLACEMENT    . RIGHT/LEFT HEART CATH AND CORONARY ANGIOGRAPHY N/A 08/08/2019   Procedure: RIGHT/LEFT HEART CATH AND CORONARY ANGIOGRAPHY;  Surgeon: Belva Crome, MD;  Location: [place] [facility];  Service: Cardiovascular;  Laterality: N/A;  . TEE WITHOUT CARDIOVERSION N/A 12/20/2019   Procedure: TRANSESOPHAGEAL ECHOCARDIOGRAM (TEE);  Surgeon: Sherren Mocha, MD;  Location: [place] [facility];  Service: Open Heart Surgery;  Laterality: N/A;  . TONSILLECTOMY    . TOTAL ABDOMINAL HYSTERECTOMY    . TOTAL HIP ARTHROPLASTY    . TRANSCATHETER AORTIC VALVE REPLACEMENT, TRANSFEMORAL N/A 12/20/2019   Procedure: TRANSCATHETER AORTIC VALVE REPLACEMENT, TRANSFEMORAL;  Surgeon: Sherren Mocha, MD;  Location: [place] [facility];  Service: Open Heart Surgery;  Laterality: N/A;   HPI:  Pt is an 79 y.o. female with history of type 2 diabetes mellitus on insulin, hyperlipdemia, hypertension, asthma, GERD, obesity, severe aortic stenosis, heart failure and coronary artery disease. Pt had atranscatheter aortic valve replacement (TAVR) on 12/20/19 and following procedure she had headache and blurred vision. 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 SLP was consulted due to concern for aphasia.    Assessment / Plan / Recommendation Clinical Impression  Pt participated in speech/language evaluation. She reported that at baseline she has difficulty with memory but is able to live independently. She denied any changes in speech or language. Her language skills were WNL during structured tasks. A single instance of word retrieval difficulty was noted at the onset of the evaluation during conversation, but no other instances were observed. Cognitive-linguistic assessment  revealed impairments in  memory and the need for intermittent repetition during executive function tasks. Pt attributed her difficulty to general aging and reported that it was her baseline. She completed complex problem solving tasks without difficulty but exhibited some difficulty with more functional problem solving such as her actively leaving her room to go search for the her nurse throughout the unit instead of using the call bell. No family/friends were present to determine whether this is indeed the pt's baseline. However, pt has verbalized agreement with her following up with her PCP if she notices difficulty with tasks which are cognitively demanding.     SLP Assessment  SLP Recommendation/Assessment: Patient does not need any further Speech Lanaguage Pathology Services SLP Visit Diagnosis: Cognitive communication deficit (R41.841)    Follow Up Recommendations  None    Frequency and Duration           SLP Evaluation Cognition  Overall Cognitive Status: No family/caregiver present to determine baseline cognitive functioning Arousal/Alertness: Awake/alert Orientation Level: Oriented X4 Attention: Focused;Sustained Focused Attention: Appears intact Sustained Attention: Appears intact Memory: Impaired Memory Impairment: Decreased recall of new information;Retrieval deficit (Immediate: 5/5; delayed: 3/5; with cues: 2/2) Awareness: Appears intact Problem Solving: Appears intact       Comprehension  Auditory Comprehension Overall Auditory Comprehension: Appears within functional limits for tasks assessed Yes/No Questions: Within Functional Limits Basic Biographical Questions:  (5/5) Complex Questions:  (5/5) Paragraph Comprehension (via yes/no questions):  (3/4) Commands: Within Functional Limits Two Step Basic Commands:  (4/4) Multistep Basic Commands:  (3/3) Conversation: Complex    Expression Expression Primary Mode of Expression: Verbal Verbal Expression Overall  Verbal Expression: Appears within functional limits for tasks assessed Initiation: No impairment Automatic Speech: Counting;Day of week;Month of year (WNL) Level of Generative/Spontaneous Verbalization: Conversation Repetition: No impairment Naming: No impairment Pragmatics: No impairment   Oral / Motor  Oral Motor/Sensory Function Overall Oral Motor/Sensory Function: Within functional limits Motor Speech Overall Motor Speech: Appears within functional limits for tasks assessed Respiration: Within functional limits Phonation: Normal Resonance: Within functional limits Articulation: Within functional limitis Intelligibility: Intelligible Motor Planning: Witnin functional limits Motor Speech Errors: Not applicable   Keilana Morlock I. Hardin Negus, [place], [place] Office number [phone] Pager [place] 12/22/2019, 12:40 PM

## 2019-12-22 NOTE — Progress Notes (Signed)
TCTS BRIEF PROGRESS NOTE  2 Days Post-Op  S/P Procedure(s) (LRB): TRANSCATHETER AORTIC VALVE REPLACEMENT, TRANSFEMORAL (N/A) TRANSESOPHAGEAL ECHOCARDIOGRAM (TEE) (N/A)   Patient feels better.  Headache gone.  Nausea and vomiting gone.  Blurry vision persists.  Plan: Probably okay for d/c home today pending f/u from stroke team.  Discussed plans for patient to see her ophthalmologist for f/u testing prior to driving a car.  [name], MD 12/22/2019 8:14 AM

## 2019-12-22 NOTE — Progress Notes (Signed)
CARDIAC REHAB PHASE I   Pt up in room, dressed, eager to d/c, off telemetry. Ambulated with pt in the hall with her sandals on. LOB x1 when she got distracted, caught herself. Rest of walk with sway/slight stagger. Pt blames her sandals. Discussed how this might be normal with CVA. Pt navigated objects appropriately. HR 120 on pulse ox, SaO2 96 RA. She sts she is SOB like she was preop. HR down with rest.  She is very determined to do things the way she wants. She is refusing [place] services. I tried to encourage her to reconsider but she became angry. I do not feel comfortable giving her walking instructions for home. She says her sister will be with her. I reminded her not to drive and she sts "we will see, I can make my own decisions". She is very interested in [place] and I will refer to [place].  [place], ACSM 12/22/2019 11:47 AM

## 2019-12-22 NOTE — Progress Notes (Addendum)
OT Evaluation:  Clinical Impression: Pt up walking around room when OT entered. Prior to admission Pt independent in ADL and mobility, drives, former OR Therapist, sports. Pt verifys that her vision remains blurry. During functional assessment Pt was able to navigate environment (min guard with support from furniture) locate scattered grooming items, sequence, and perform grooming while standing at sink (SUE support) She was able to read from handout, and she has good access to LB for ADL from seated position. I am concerned about safety awareness, and think initially she will need 24 hour supervision, and someone to monitor her executive functioning (have HER manage medicines and make sure its correct, watch finanaces to make sure something bad doesn't happen) educated Pt in that if she starts to notice difficulty that she should tell her primary MD. Reinforced NO DRIVING until cleared by MD/opthamolist.  Also recommend follow up with opthamologist (neuro if possible - left card for Dr. Gordan Payment FCOVD as he specializes in neurodevelopmental optometry - but that she can go wherever she likes). OT will follow acutely (although anticipated discharge today) highly recommend follow up with outpatient OT to monitor vision and cognition.     12/22/19 0900  OT Visit Information  Last OT Received On 12/22/19  Assistance Needed +1  History of Present Illness 79 yo s/p TAVR n 9/21 with perioperative multifocal infarcts on MRI with HA and blurry vision. 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 Through observation she did not want to use the RW in room, but did rely on leaning on furniture and sink surface. Pt continues to present with visual deficits - however was surprised when she was educated not to drive until cleared by MD. Pt has background in healthcare (OR nurse) and so she is good about asking the right questions, but when it comes to safety - she does not seem aware of how deficits in vision/balance affect her overall function and safety  Upper Extremity Assessment  Upper Extremity Assessment Overall WFL for tasks assessed  Lower Extremity Assessment  Lower Extremity Assessment Defer to PT evaluation  Cervical / Trunk Assessment  Cervical / Trunk Assessment Normal  ADL  Overall ADL's  Needs assistance/impaired  Eating/Feeding Independent  Grooming Wash/dry hands;Oral care;Standing;Min guard  Grooming Details (indicate cue type and reason) leans against sink, able to open all containers, sequence activity, find grooming items spread around the sink area intentionally by therapist.   Upper Body Bathing Set up  Lower Body Bathing Min guard  Upper Body Dressing  Modified independent;Sitting  Lower Body Dressing Min guard;Sit to/from stand  Lower Body Dressing Details  (indicate cue type and reason) Pt able to perform figure 4 method for LB dressing  Toilet Transfer Min guard;Ambulation  Toilet Transfer Details (indicate cue type and reason) Pt "furniture surfing" throughout session, leans against sink for balance during grooming, did not attempt to use RW at all throughout session  Toileting- Water quality scientist and Hygiene Min guard;Sit to/from stand  Tub/ Fish farm manager guard  Functional mobility during ADLs Min guard;Cueing for safety  General ADL Comments decreased safety awareness, decreased vision,   Vision- History  Baseline Vision/History Wears glasses  Wears Glasses Reading only  Patient Visual Report Blurring of vision;Other (comment)  Vision- Assessment  Vision Assessment? Vision impaired- to be further tested in functional context;Yes  Eye Alignment WFL  Ocular Range of Motion [facility]  Alignment/Gaze Preference WDL  Visual Fields Left visual field deficit (upper quadrant)  Depth Perception Undershoots  Additional Comments Pt able to find grooming items around sink and read various sizes of print from handout.   Bed Mobility  Overal bed mobility Modified Independent  Transfers  Overall transfer level Needs assistance  Equipment used None  Transfers Sit to/from Stand;Stand Pivot Transfers  Sit to Stand Min guard  Stand pivot transfers Min guard  General transfer comment Pt utilized furniture for support, consistently but does not use RW which was within reach  Balance  Overall balance assessment Needs assistance  Sitting-balance support Feet supported  Sitting balance-Leahy Scale Good  Sitting balance - Comments able to demonstrate dynamic balance activities in sitting without LOB  Standing balance support Single extremity supported  Standing balance-Leahy Scale Poor  Standing balance comment Pt requires at least one UE support in standing for balance  OT - End of Session  Equipment Utilized During Treatment Gait belt  Activity  Tolerance Patient tolerated treatment well  Patient left in chair;with call bell/phone within reach  Nurse Communication Mobility status  OT Assessment  OT Recommendation/Assessment Patient needs continued OT Services  OT Visit Diagnosis Other symptoms and signs involving cognitive function;Other abnormalities of gait and mobility (R26.89);Low vision, both eyes (H54.2)  OT Problem List Impaired balance (sitting and/or standing);Impaired vision/perception;Decreased safety awareness  Barriers to Discharge Comments Sister planning on staying with her at discharge  OT Plan  OT Frequency (ACUTE ONLY) Min 2X/week  OT Treatment/Interventions (ACUTE ONLY) Visual/perceptual remediation/compensation;Cognitive remediation/compensation;Patient/family education;Balance training  AM-PAC OT "6 Clicks" Daily Activity Outcome Measure (Version 2)  Help from another person eating meals? 4  Help from another person taking care of personal grooming? 4  Help from another person toileting, which includes using toliet, bedpan, or urinal? 4  Help from another person bathing (including washing, rinsing, drying)? 3  Help from another person to put on and taking off regular upper body clothing? 4  Help from another person to put on and taking off regular lower body clothing? 4  6 Click Score 23  OT Recommendation  Follow Up Recommendations Outpatient OT;Other (comment) (eye MD)  OT Equipment Tub/shower seat  Individuals Consulted  Consulted and Agree with Results and Recommendations Patient  Acute Rehab OT Goals  Patient Stated Goal to get home ASAP  OT Goal Formulation With patient  Time For Goal Achievement 01/05/20  Potential to Achieve Goals Good  OT Time Calculation  OT Start Time (ACUTE ONLY) 0858  OT Stop Time (ACUTE ONLY) 0925  OT Time Calculation (min) 27 min  OT General Charges  $OT Visit  1 Visit  OT Evaluation  $OT Eval Moderate Complexity [place] OTR/L Acute Rehabilitation  Services Pager: [phone] Office: [phone]

## 2019-12-22 NOTE — Progress Notes (Signed)
Report given to LPN [facility] concerning patient confusion.  Patient tried to go to closet for restroom. Patient bed alarm going off frequently and patient not able to remember to call for safety.  Patient upset during report that this was mentioned. Patient anxious but easily reassured and settles briefly. Pt resting with call bell within reach.  Will continue to monitor.

## 2019-12-22 NOTE — Progress Notes (Signed)
[employer] patch placed onto patient.  All instructions and information reviewed with patient, they verbalize understanding with no questions. 

## 2019-12-23 ENCOUNTER — Telehealth: Payer: Self-pay | Admitting: *Deleted

## 2019-12-23 ENCOUNTER — Telehealth: Payer: Self-pay | Admitting: Physician Assistant

## 2019-12-23 NOTE — Telephone Encounter (Signed)
New message:     Patient calling stating that she need [employer]IO patch she lost her.

## 2019-12-23 NOTE — Telephone Encounter (Signed)
Patient had a monitor applied yesterday at the hospital. It fell off sometime in the middle of the night. She called [employer] and they are sending her a new one.

## 2019-12-23 NOTE — Telephone Encounter (Signed)
Returned call to [facility] and confirmed a new monitor should be mailed to patient.

## 2019-12-23 NOTE — Telephone Encounter (Signed)
Reference# [id number]     Please call today if possible, pt's  Monitor came off.

## 2019-12-26 ENCOUNTER — Telehealth: Payer: Self-pay | Admitting: Physician Assistant

## 2019-12-26 NOTE — Telephone Encounter (Signed)
  [facility]   MULTIDISCIPLINARY HEART VALVE TEAM   Attempted TOC call.   Azariya Freeman PA-C  [facility]   

## 2019-12-27 ENCOUNTER — Telehealth: Payer: Self-pay | Admitting: Physician Assistant

## 2019-12-27 ENCOUNTER — Telehealth: Payer: Self-pay | Admitting: Cardiovascular Disease

## 2019-12-27 NOTE — Telephone Encounter (Signed)
Per Nell Range, informed the patient she may get her booster as long as other vaccine criteria has been met (as far as time between shots). She understands to confirm with pharmacist when she goes if she has any further questions. She was grateful for assistance.

## 2019-12-27 NOTE — Telephone Encounter (Signed)
    Pt is calling she said her bandages is not coming lose, she is afraid if she try to pull it off it will hurt her skin. She wants recommendation on how she can take it off

## 2019-12-27 NOTE — Telephone Encounter (Signed)
Cheryl Hernandez is calling wanting to know how long she should wait to get the booster shot since having her surgery. Please advise.

## 2019-12-27 NOTE — Telephone Encounter (Signed)
Returned call to patient she is aware that Cheryl Hernandez will help her at her upcoming appt

## 2019-12-27 NOTE — Telephone Encounter (Signed)
Please let her know I can remove when I see her in the office.

## 2019-12-29 ENCOUNTER — Other Ambulatory Visit: Payer: Self-pay

## 2019-12-29 ENCOUNTER — Ambulatory Visit (INDEPENDENT_AMBULATORY_CARE_PROVIDER_SITE_OTHER): Payer: Medicare Other | Admitting: Physician Assistant

## 2019-12-29 ENCOUNTER — Encounter: Payer: Self-pay | Admitting: Physician Assistant

## 2019-12-29 VITALS — BP 126/60 | HR 88 | Ht 63.5 in | Wt 139.2 lb

## 2019-12-29 DIAGNOSIS — I6523 Occlusion and stenosis of bilateral carotid arteries: Secondary | ICD-10-CM | POA: Diagnosis not present

## 2019-12-29 DIAGNOSIS — I10 Essential (primary) hypertension: Secondary | ICD-10-CM | POA: Diagnosis not present

## 2019-12-29 DIAGNOSIS — I6310 Cerebral infarction due to embolism of unspecified precerebral artery: Secondary | ICD-10-CM

## 2019-12-29 DIAGNOSIS — Z952 Presence of prosthetic heart valve: Secondary | ICD-10-CM

## 2019-12-29 NOTE — Patient Instructions (Addendum)
Medication Instructions:  Your provider recommends that you continue on your current medications as directed. Please refer to the Current Medication list given to you today.    Follow-Up: You have been referred to Dr. Chauncy Passy.  Please keep your follow-up appointments as scheduled!

## 2019-12-29 NOTE — Progress Notes (Signed)
[facility] [place]                                       Cardiology Office Note    Date:  12/29/2019   ID:  Cheryl Hernandez, DOB 10/08/1940, MRN [id number]  PCP:  [name], MD  Cardiologist: Sinclair Grooms, MD / Dr. Burt Knack & Dr. Roxy Manns (TAVR)  CC: TOC s/p TAVR  History of Present Illness:  Cheryl Hernandez is a 79 y.o. 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   Patient states that she has known of presence of a heart murmur for many years. In 2009 she suffered an acute non-ST segment elevation myocardial infarction for which she was treated with PCI and stenting of the right coronary artery. She has been followed carefully ever since by Dr. [name]. She reported a 2-3 year hx of progressive exertional shortness of breath and fatigue. Echocardiograms have documented the presence of aortic stenosis that has gradually increased in severity. Diagnostic cardiac catheterization was performed by Dr. [name] on Aug 08, 2019. 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 Catheterization also confirmed the presence of aortic stenosis with peak to peak and mean transvalvular gradients measured 33 and 29 mmHg respectively, corresponding to aortic valve area calculated only 0.81 cm. Right-sided pressures were normal.Follow-up transthoracic echocardiogram was performed July 9, 2021revealing findings consistent with normal left ventricular systolic function but paradoxical severe low-flow low gradient aortic stenosis.  She was evaluated  by the multidisciplinary valve team and underwent successful TAVR with a 26 mm Medtronic Evolut Pro + THV via the TF approach on 12/20/19. Post operative echo showed EF 70%, normally functioning TAVR with a mean gradient of 10.5 mmHg and no PVL. Mild-mod MR. Unfortunately, pt developed a persistent headache and blurry vision s/p TAVR. 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 She was seen by neurology and underwent full stroke work up. She was discharged on aspirin and plavix. She was discharged with a Zio patch to rule out atrial fibrillation.   Today she presents to clinic for follow up. No CP. Still has some SOB. Eager to started cardiac rehab. No LE edema, orthopnea or PND. No dizziness or syncope. No blood in stool or urine. No palpitations. Still having visual issues after stroke and actually having hallucinations of people and objects she knows are not there.    Past Medical History:  Diagnosis Date  . Arthritis   . Asthma   . CAD (coronary artery disease)    a. NSTEMI 2009 s/p DES to RCAx2.  . Carotid artery disease ([place])    a. Mild-moderate - followed by [facility].  . Chronic bronchitis ([place])    per patient  . Chronic diarrhea   . Diabetes mellitus without complication ([place])   . Diverticulosis   . GERD (gastroesophageal reflux disease)   . HTN (hypertension)   . Hx of migraines    as a child  . Hyperlipidemia   . Myocardial infarction [facility])    per patient "about 2011"  .  Neuropathy    per patient in feet, legs, and some in hands  . Obesity   . Paroxysmal ventricular tachycardia ([place])   . Rhinitis   . Rotator cuff tear   . 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 Burt Knack and Dr. Roxy Manns   . Severe aortic stenosis     Past Surgical History:  Procedure Laterality Date  . ANGIOPLASTY    . CORONARY ANGIOPLASTY WITH STENT PLACEMENT    . RIGHT/LEFT HEART CATH AND CORONARY  ANGIOGRAPHY N/A 08/08/2019   Procedure: RIGHT/LEFT HEART CATH AND CORONARY ANGIOGRAPHY;  Surgeon: Belva Crome, MD;  Location: [place] [facility];  Service: Cardiovascular;  Laterality: N/A;  . TEE WITHOUT CARDIOVERSION N/A 12/20/2019   Procedure: TRANSESOPHAGEAL ECHOCARDIOGRAM (TEE);  Surgeon: Sherren Mocha, MD;  Location: [place] [facility];  Service: Open Heart Surgery;  Laterality: N/A;  . TONSILLECTOMY    . TOTAL ABDOMINAL HYSTERECTOMY    . TOTAL HIP ARTHROPLASTY    . TRANSCATHETER AORTIC VALVE REPLACEMENT, TRANSFEMORAL N/A 12/20/2019   Procedure: TRANSCATHETER AORTIC VALVE REPLACEMENT, TRANSFEMORAL;  Surgeon: Sherren Mocha, MD;  Location: [place] [facility];  Service: Open Heart Surgery;  Laterality: N/A;    Current Medications: Outpatient Medications Prior to Visit  Medication Sig Dispense Refill  . acetaminophen (TYLENOL) 500 MG tablet Take 500 mg by mouth every 6 (six) hours as needed (for arthritis pain.).    Marland Kitchen albuterol (VENTOLIN HFA) 108 (90 Base) MCG/ACT inhaler Inhale 2 puffs into the lungs every 6 (six) hours as needed for wheezing or shortness of breath. 8 g 3  . Alpha-D-Galactosidase (BEANO PO) Take 1 tablet by mouth daily as needed (flatulence/abdominal bloating.).    Marland Kitchen aspirin EC 81 MG tablet Take 162 mg by mouth every evening.     . B Complex-C (B-COMPLEX WITH VITAMIN C) tablet Take 1 tablet by mouth daily.     . budesonide (ENTOCORT EC) 3 MG 24 hr capsule Take 9 mg by mouth 2 (two) times daily as needed (diarrhea).     . budesonide-formoterol (SYMBICORT) 80-4.5 MCG/ACT inhaler Inhale 2 puffs into the lungs 2 (two) times daily. 1 Inhaler 11  . BYSTOLIC 5 MG tablet TAKE ONE TABLET BY MOUTH DAILY (Patient taking differently: Take 2.5 mg by mouth daily. ) 90 tablet 3  . calcium carbonate (TUMS - DOSED IN MG ELEMENTAL CALCIUM) 500 MG chewable tablet Chew 1 tablet by mouth 3 (three) times daily as needed for heartburn.     . Cholecalciferol (VITAMIN D3) 75 MCG (3000 UT) TABS  Take 3,000 Units by mouth every other day. In the morning    . clopidogrel (PLAVIX) 75 MG tablet Take 1 tablet (75 mg total) by mouth daily with breakfast. 90 tablet 1  . Evolocumab (REPATHA SURECLICK) 315 MG/ML SOAJ Inject 1 pen into the skin every 14 (fourteen) days. 2 pen 11  . fexofenadine (ALLEGRA) 180 MG tablet Take 180 mg by mouth daily.      . Glucose Blood (FREESTYLE LITE TEST VI) Once daily    . guaifenesin (HUMIBID E) 400 MG TABS tablet Take 400 mg by mouth daily.    . Magnesium Citrate 125 MG CAPS Take 125 mg by mouth daily.    . metFORMIN (GLUCOPHAGE-XR) 500 MG 24 hr tablet Take 500 mg by mouth daily.    . Multiple Vitamins-Minerals (MULTIVITAMIN WITH MINERALS) tablet Take 1 tablet by mouth  every other day. In the morning    . nitroGLYCERIN (NITROSTAT) 0.4 MG SL tablet Place 1 tablet (0.4 mg total) under the tongue every 5 (five) minutes as needed for chest pain. (Patient taking differently: Place 0.4 mg under the tongue every 5 (five) minutes x 3 doses as needed for chest pain. ) 25 tablet 3  . Omega-3 1000 MG CAPS Take 1,000 mg by mouth every other day. In the morning.    Marland Kitchen OVER THE COUNTER MEDICATION Take 1 capsule by mouth every other day. Circulation and vein     . PHOSPHATIDYL CHOLINE PO Take 1 capsule by mouth every other day. Jeannetta Nap Phosphatidyl Choline Liver &Brain    . Probiotic Product (ALIGN) 4 MG CAPS Take 4 mg by mouth every other day.    Marland Kitchen Respiratory Therapy Supplies (FLUTTER) DEVI 1 each every other day. Use flutter valve 1 times daily as needed    . temazepam (RESTORIL) 15 MG capsule Take 15 mg by mouth at bedtime as needed for sleep.    . Turmeric 400 MG CAPS Take 400 mg by mouth every other day. In the morning.    . vitamin B-12 (CYANOCOBALAMIN) 1000 MCG tablet Take 1,000 mcg by mouth every other day.     No facility-administered medications prior to visit.     Allergies:   Cefuroxime axetil, Cephalexin, Codeine, Doxycycline, Erythromycin base, and  Statins   Social History   Socioeconomic History  . Marital status: Widowed    Spouse name: Not on file  . Number of children: Not on file  . Years of education: Not on file  . Highest education level: Not on file  Occupational History  . Occupation: retired Therapist, sports  Tobacco Use  . Smoking status: Former Smoker    Packs/day: 1.00    Years: 30.00    Pack years: 30.00    Types: Cigarettes    Quit date: 03/31/1998    Years since quitting: 21.7  . Smokeless tobacco: Never Used  . Tobacco comment: Quit intermittently since her 53s.  Vaping Use  . Vaping Use: Never used  Substance and Sexual Activity  . Alcohol use: Yes    Alcohol/week: 0.0 standard drinks    Comment: Rare.  . Drug use: No  . Sexual activity: Not on file  Other Topics Concern  . Not on file  Social History Narrative   Originally from [place]. She has traveled to [place], [place], [place], & [place]. Previously worked as a Marine scientist. No known TB exposure. No pets currently. No bird, hot tube, or asbestos exposure. Enjoys playing bridge.    Social Determinants of Health   Financial Resource Strain:   . Difficulty of Paying Living Expenses: Not on file  Food Insecurity:   . Worried About Charity fundraiser in the Last Year: Not on file  . Ran Out of Food in the Last Year: Not on file  Transportation Needs:   . Lack of Transportation (Medical): Not on file  . Lack of Transportation (Non-Medical): Not on file  Physical Activity:   . Days of Exercise per Week: Not on file  . Minutes of Exercise per Session: Not on file  Stress:   . Feeling of Stress : Not on file  Social Connections:   . Frequency of Communication with Friends and Family: Not on file  . Frequency of Social Gatherings with Friends and Family: Not on file  . Attends Religious Services: Not on file  . Active Member of Clubs  or Organizations: Not on file  . Attends Archivist Meetings: Not on file  . Marital Status: Not on file     Family History:  The  patient's family history includes Allergies in her mother; Heart attack in her father; Hyperlipidemia in her mother; Prostate cancer in her maternal grandfather.     ROS:   Please see the history of present illness.    ROS All other systems reviewed and are negative.   PHYSICAL EXAM:   VS:  BP 126/60   Pulse 88   Ht 5' 3.5" (1.613 m)   Wt 139 lb 3.2 oz (63.1 kg)   SpO2 94%   BMI 24.27 kg/m    GEN: Well nourished, well developed, in no acute distress HEENT: normal Neck: no JVD or masses Cardiac: RRR; soft flow murmur. No rubs, or gallops,no edema  Respiratory:  clear to auscultation bilaterally, normal work of breathing GI: soft, nontender, nondistended, + BS MS: no deformity or atrophy Skin: warm and dry, no rash.  Groin sites clear without hematoma or ecchymosis. Bandages removed Neuro:  Alert and Oriented x 3, Strength and sensation are intact Psych: euthymic mood, full affect   Wt Readings from Last 3 Encounters:  12/29/19 139 lb 3.2 oz (63.1 kg)  12/22/19 142 lb 6.4 oz (64.6 kg)  12/16/19 142 lb 4.8 oz (64.5 kg)      Studies/Labs Reviewed:   EKG:  EKG is ordered today.  The ekg ordered today demonstrates sinus HR 88 bpm  Recent Labs: 07/25/2019: NT-Pro BNP 183 12/16/2019: ALT 17; B Natriuretic Peptide 117.4 12/21/2019: Magnesium 1.6 12/22/2019: BUN 8; Creatinine, Ser 0.86; Hemoglobin 11.3; Platelets 207; Potassium 3.6; Sodium 138   Lipid Panel    Component Value Date/Time   CHOL 157 12/22/2019 0841   CHOL 150 07/04/2019 0749   TRIG 151 (H) 12/22/2019 0841   HDL 67 12/22/2019 0841   HDL 68 07/04/2019 0749   CHOLHDL 2.3 12/22/2019 0841   VLDL 30 12/22/2019 0841   LDLCALC 60 12/22/2019 0841   LDLCALC 64 07/04/2019 0749    Additional studies/ records that were reviewed today include:  TAVR OPERATIVE NOTE   Date of Procedure:12/20/2019  Preoperative Diagnosis:Severe Aortic Stenosis   Postoperative Diagnosis:Same    Procedure:   Transcatheter Aortic Valve Replacement - PercutaneousRightTransfemoral Approach Medtronic CoreValve Evolut Pro (size 60mm, serial # [id number])  Co-Surgeons:Clarence H. Roxy Manns, MD and Sherren Mocha, MD  Anesthesiologist:Charlene Nyoka Cowden, MD  Echocardiographer:Mihai Croitoru, MD  Pre-operative Echo Findings: ? Severe aortic stenosis ? Normalleft ventricular systolic function  Post-operative Echo Findings: ? Noparavalvular leak ? Normalleft ventricular systolic function  _____________   Echo 12/21/19:  IMPRESSIONS  1. A 3.5 m/s intracavitary "gradient" is present due to hyperdynamic left  ventricular contraction. Left ventricular ejection fraction, by  estimation, is 70 to 75%. The left ventricle has hyperdynamic function.  The left ventricle has no regional wall  motion abnormalities. Left ventricular diastolic parameters are consistent  with Grade II diastolic dysfunction (pseudonormalization). Elevated left  atrial pressure.  2. Right ventricular systolic function is normal. The right ventricular  size is normal. There is mildly elevated pulmonary artery systolic  pressure.  3. The mitral valve is normal in structure. Mild to moderate mitral valve  regurgitation. No evidence of mitral stenosis. Moderate mitral annular  calcification.  4. The aortic valve has been repaired/replaced. Aortic valve  regurgitation is not visualized. No aortic stenosis is present. There is a  CoreValve-Evolut Pro prosthetic (TAVR) valve present in the  aortic  position. Procedure Date: 12/20/2019.  5. The inferior vena cava is normal in size with greater than 50%  respiratory variability, suggesting right atrial pressure of 3 mmHg.     ASSESSMENT & PLAN:   Severe AS s/p TAVR:doing okay. Groin sites look excellent. ECG with no HAVB. Continue on aspirin and plavix. SBE prophylaxis  discussed; I have RX'd amoxicillin. I will see her back for follow up and echo next month.   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 She is seeing objects that she knows are not there. Very worried this may get worse. Advised not to drive unless cleared by ophthalmologist. I have reached out to neurology to see if they can arrange follow up and also will refer to Dr. Chauncy Passy with neuropsychology to see if he can help her improve visual deficits.   Carotid artery disease: pre TAVR dopplers showed 22-63% LICA stenosis. Continue antiplatelet therapy. Intolerant to statins. On Repatha.   HTN: Bp well controlled today. No changes made.    Medication Adjustments/Labs and Tests Ordered: Current medicines are reviewed at length with the patient today.  Concerns regarding medicines are outlined above.  Medication changes, Labs and Tests ordered today are listed in the Patient Instructions below. Patient Instructions  Medication Instructions:  Your provider recommends that you continue on your current medications as directed. Please refer to the Current Medication list given to you today.    Follow-Up: You have been referred to Dr. Chauncy Passy.  Please keep your follow-up appointments as scheduled!    Signed, Angelena Form, PA-C  12/29/2019 2:37 PM    [facility] [address], [place], [place]  [address] Phone: [phone]; Fax: [phone]

## 2019-12-30 ENCOUNTER — Telehealth (HOSPITAL_COMMUNITY): Payer: Self-pay | Admitting: *Deleted

## 2019-12-30 ENCOUNTER — Telehealth (HOSPITAL_COMMUNITY): Payer: Self-pay

## 2019-12-30 ENCOUNTER — Encounter: Payer: Self-pay | Admitting: Physician Assistant

## 2019-12-30 NOTE — Telephone Encounter (Signed)
Pt insurance is active and benefits verified through [facility] Medicare Co-pay 0, DED 0/0 met, out of pocket $3,600/$845.96 met, co-insurance 0%. no pre-authorization required. Passport, 12/28/2019@10 :10am, REF# [id number]  Will contact patient to see if she is interested in the Cardiac Rehab Program. If interested, patient will need to complete follow up appt. Once completed, patient will be contacted for scheduling upon review by the RN Navigator.

## 2019-12-30 NOTE — Telephone Encounter (Signed)
Called patient to see if she is interested in the Cardiac Rehab Program. Patient expressed interest. Explained scheduling process and went over insurance, patient verbalized understanding. Will contact patient for scheduling once f/u has been completed. 

## 2019-12-30 NOTE — Telephone Encounter (Signed)
error 

## 2019-12-30 NOTE — Telephone Encounter (Signed)
Called and spoke to pt regarding cardiac rehab scheduling.  Pt admits to seeing objects/shadows in her peripheral visual field.  Pt states that she doesn't have them all the time but sees them when she is walking and sitting.  Pt mentioned that when she is watching TV she will see them.  Wonders if it is a Charity fundraiser from the TV screen.  Advised pt that she will need to be seen by neurology prior to scheduling.  Pt stated that she had not heard from them.  I checked the referral and a call was made on 9/27 but unable to leave message.  Gave pt the contact number for [facility] neurology for her to call and schedule an appt.   Pt instructed not to drive until seen by ophthalmologist.  Pt adamant that she can drive with no problems. Explained to pt that this is not safe for her and other drivers.  Pt reconsidered and will call her eye doctor for an appointment. Pt also has a referral for neuropsychologist placed yesterday - no documentation yet.  Will continue to check back in with pt regarding completion of the above appt.  Pt aware that we are scheduling for November Maurice Small RN, BSN Cardiac and Pulmonary Rehab Nurse Navigator

## 2020-01-02 ENCOUNTER — Encounter: Payer: Self-pay | Admitting: Psychology

## 2020-01-03 ENCOUNTER — Encounter: Payer: Self-pay | Admitting: Gastroenterology

## 2020-01-03 ENCOUNTER — Ambulatory Visit: Payer: Self-pay

## 2020-01-03 ENCOUNTER — Ambulatory Visit (INDEPENDENT_AMBULATORY_CARE_PROVIDER_SITE_OTHER): Payer: Medicare Other | Admitting: Gastroenterology

## 2020-01-03 VITALS — BP 140/60 | HR 76 | Ht 62.25 in | Wt 141.0 lb

## 2020-01-03 DIAGNOSIS — K5909 Other constipation: Secondary | ICD-10-CM

## 2020-01-03 DIAGNOSIS — K52831 Collagenous colitis: Secondary | ICD-10-CM | POA: Diagnosis not present

## 2020-01-03 MED ORDER — BUDESONIDE 3 MG PO CPEP: 6.0000 mg | ORAL_CAPSULE | Freq: Two times a day (BID) | ORAL | 2 refills | Status: AC | PRN

## 2020-01-03 NOTE — Patient Instructions (Signed)
We have sent the following medications to your pharmacy for you to pick up at your convenience: Budesonide   Use Miralax 1 capful dissolved in at least 8 ounces of water daily to help with constipation.   Follow-up in 1 year. Sooner if needed.   If you are age 80 or older, your body mass index should be between 23-30. Your Body mass index is 25.58 kg/m. If this is out of the aforementioned range listed, please consider follow up with your Primary Care Provider.  If you are age 68 or younger, your body mass index should be between 19-25. Your Body mass index is 25.58 kg/m. If this is out of the aformentioned range listed, please consider follow up with your Primary Care Provider.     Thank you for choosing me and [facility].  Pricilla Riffle. Dagoberto Ligas., MD., Marval Regal

## 2020-01-03 NOTE — Progress Notes (Signed)
    History of Present Illness: This is an 79 year old female complaining of alternating diarrhea and constipation.  She is accompanied by her daughter.  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  When she takes Entocort regularly she becomes constipation so she will discontinue Entocort and use a laxative like MiraLAX which then leads to diarrhea and she then resumes Entocort.  She is frustrated.  She denies abdominal pain, rectal bleeding, weight loss.  Current Medications, Allergies, Past Medical History, Past Surgical History, Family History and Social History were reviewed in [employer] record.   Physical Exam: General: Well developed, well nourished, no acute distress Head: Normocephalic and atraumatic Eyes:  sclerae anicteric, EOMI Ears: Normal auditory acuity Mouth: Not examined, mask on during Covid-19 pandemic Lungs: Clear throughout to auscultation Heart: Regular rate and rhythm; no murmurs, rubs or bruits Abdomen: Soft, non tender and non distended. No masses, hepatosplenomegaly or hernias noted. Normal Bowel sounds Rectal: Not done Musculoskeletal: Symmetrical with no gross deformities  Pulses:  Normal pulses noted Extremities: No clubbing, cyanosis, edema or deformities noted Neurological: Alert oriented x 4, grossly nonfocal Psychological:  Alert and cooperative. Normal mood and affect   Assessment and Recommendations:  1. Diarrhea, collagenous colitis.  Chronic constipation.  Entocort 3 mg po qd. for diarrhea is not controlled on 3 mg daily increase to 6 mg qd. She is advised not to  stop Entocort to manage constipation.  A stable dose of Entocort every day along with managing constipation with MiraLAX, between a half scoop and a whole scoop daily, should be more effective.  If dietary measures are effective in managing constipation she may not need MiraLAX.  She is advised to call  if this regimen is not working well within the next few weeks. REV in 1 year.

## 2020-01-04 ENCOUNTER — Telehealth: Payer: Self-pay | Admitting: Podiatry

## 2020-01-04 NOTE — Telephone Encounter (Signed)
Pt left message yesterday asking me to call her.  I returned call today and left message for pt to call me back.

## 2020-01-06 ENCOUNTER — Telehealth (HOSPITAL_COMMUNITY): Payer: Self-pay | Admitting: *Deleted

## 2020-01-06 NOTE — Telephone Encounter (Signed)
Called and left message for well being check in and follow up for referral for cardiac rehab.   Pt given contact information.  Await return call. Cherre Huger, BSN Cardiac and Training and development officer

## 2020-01-25 ENCOUNTER — Encounter: Payer: Medicare Other | Attending: Psychology | Admitting: Psychology

## 2020-01-25 ENCOUNTER — Other Ambulatory Visit: Payer: Self-pay

## 2020-01-25 DIAGNOSIS — I25119 Atherosclerotic heart disease of native coronary artery with unspecified angina pectoris: Secondary | ICD-10-CM | POA: Diagnosis present

## 2020-01-25 DIAGNOSIS — I350 Nonrheumatic aortic (valve) stenosis: Secondary | ICD-10-CM | POA: Insufficient documentation

## 2020-01-25 DIAGNOSIS — I63431 Cerebral infarction due to embolism of right posterior cerebral artery: Secondary | ICD-10-CM | POA: Diagnosis not present

## 2020-01-25 NOTE — Progress Notes (Signed)
Neuropsychological Consultation   Patient:   Calirose Mccance   DOB:   June 04, 1940  MR Number:  [id number]  Location:  [facility] [place], [place] [id number] [place] [id number] Dept: [phone]           Date of Service:   01/25/2020  Start Time:   9 AM End Time:   11 AM  Today's visit was 2 hours in total.  1 hour and 15 minutes was used in face-to-face clinical interview and another 45 minutes was instructed with conceptualization of case, records review and report writing.  Provider/Observer:  Ilean Skill, Psy.D.       Clinical Neuropsychologist       Billing Code/Service: 96116/96121  Chief Complaint:    Jaquay A. Wallenstein is an 79 year old female referred by the patient's PCP practice including Angelena Form, PA-C and [name], MD for neuropsychological consultation due to residual effects both neurological/neuropsychological as well as emotional impact of the patient's recent stroke event.  The patient has had significant chronic coronary artery disease and was recently having a transcatheter aortic valve replacement procedure and postoperatively she developed a persistent headache and blurred vision.  MRI brain was positive for multifocal acute infarcts.  While the patient has made significant improvement with issues such as her vision and other neurological/cognitive areas the patient is continued to have a lot of worries about ongoing stroke risk and managing and coping with post stroke effects both neurologically and emotionally.  Reason for Service:  Judieth A. Conaway is an 79 year old female referred by the patient's PCP practice including Angelena Form, PA-C and [name], MD for neuropsychological consultation due to residual effects both neurological/neuropsychological as well as emotional impact of the patient's recent stroke  event.  The patient has had significant chronic coronary artery disease and was recently having a transcatheter aortic valve replacement procedure and postoperatively she developed a persistent headache and blurred vision.  MRI brain was positive for multifocal acute infarcts.  While the patient has made significant improvement with issues such as her vision and other neurological/cognitive areas the patient is continued to have a lot of worries about ongoing stroke risk and managing and coping with post stroke effects both neurologically and emotionally.  The patient has a past medical history including recent TAVR, type 2 diabetes without complication, hyperlipidemia, essential hypertension, tachycardia, moderate asthma, GERD, severe aortic stenosis, coronary artery disease, acute on chronic diastolic heart failure, and past cerebral embolism with cerebral infarction.  MRI of the patient's brain after the development of headaches and blurred vision status post TAVR show multifocal acute infarcts predominantly in the bilateral posterior circulation.  There are also some trace anterior circulation involvement as well.  Most confluent infarct was in the right PCA territory.  CTA showed acute infarct in the right PCA territory involving the inferior right occipital lobe and right lateral thalamus.  There were additional small areas of acute infarct in the left superior cerebellum.  No hemorrhagic effect was noted.  The patient reports that she has made significant improvements in her overall functioning and she feels like she is essentially gone back to her previous baseline.  She does acknowledge some residual visual changes.  The patient is also having some visual hallucinations that she is well aware of them not being accurate or real and they do not have any delusional type aspect to them.  It is unclear how much  these visual hallucinations are related to post stroke effects or are related to severe sleep  deprivation.  The patient reports that these visual hallucinations primarily occur in the evening when she is in her den and they do not occur during the day or another areas of the house.  These are potentially hypnagogic like events and/or episodes of wakeful dreaming.  The patient denies any significant depression or anxiety symptomatology but does report increased concerns about her medical status and mortality.  She is concerned about further stroke events because of the information she is learned and read about the level of arterial plaque she is developed and issues related to her coronary artery disease in general.  Behavioral Observation: Lakevia Elonna Mcfarlane  presents as a 79 y.o.-year-old Right Caucasian Female who appeared her stated age. her dress was Appropriate and she was Well Groomed and her manners were Appropriate to the situation.  her participation was indicative of Appropriate and Attentive behaviors.  There were not any physical disabilities noted.  she displayed an appropriate level of cooperation and motivation.     Interactions:    Active Appropriate  Attention:   within normal limits and attention span and concentration were age appropriate  Memory:   within normal limits; recent and remote memory intact  Visuo-spatial:  abnormal mild residual effects of her occipital lobe infarction  Speech (Volume):  normal  Speech:   normal; normal  Thought Process:  Coherent and Relevant  Though Content:  WNL; not suicidal and not homicidal  Orientation:   person, place, time/date and situation  Judgment:   Good  Planning:   Good  Affect:    Anxious  Mood:    Anxious  Insight:   Good  Intelligence:   high  Marital Status/Living: The patient was born and raised in [place] along with 1 sibling.  The patient currently lives went herself and is lives so since she was widowed.  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  Hobbies and  interests have included gardening, playing bridge, tennis and painting.  Substance Use:  No concerns of substance abuse are reported.  Education:   The patient graduated from high school and completed her nursing degree.  Medical History:   Past Medical History:  Diagnosis Date  . Arthritis   . Asthma   . CAD (coronary artery disease)    a. NSTEMI 2009 s/p DES to RCAx2.  . Carotid artery disease ([place])    a. Mild-moderate - followed by [facility].  . Chronic bronchitis ([place])    per patient  . Chronic diarrhea   . Diabetes mellitus without complication ([place])   . Diverticulosis   . GERD (gastroesophageal reflux disease)   . HTN (hypertension)   . Hx of migraines    as a child  . Hyperlipidemia   . Myocardial infarction [facility])    per patient "about 2011"  . Neuropathy    per patient in feet, legs, and some in hands  . Obesity   . Paroxysmal ventricular tachycardia ([place])   . Rhinitis   . Rotator cuff tear   . 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 Burt Knack and Dr. Roxy Manns   . Severe aortic stenosis            Abuse/Trauma History: While the patient described any history of abuse or trauma the  recent hospitalization with post operative complications from TAVR because the patient a good deal of distress.  It was difficult for her to be in the hospital for an extended period of time and managing with hospital requirements and regulations that were frustrating to her.  She also had to face issues related to her on mortality and has developed some existential types of psychological impacts.  Psychiatric History:  No prior psychiatric history noted.  Family Med/Psych History:  Family History  Problem Relation Age of Onset  . Allergies Mother   . Hyperlipidemia Mother   . Prostate cancer Maternal Grandfather   . Heart attack Father   . Lung disease Neg Hx    Impression/DX:  Quinlynn A. Guerry is  an 79 year old female referred by the patient's PCP practice including Angelena Form, PA-C and [name], MD for neuropsychological consultation due to residual effects both neurological/neuropsychological as well as emotional impact of the patient's recent stroke event.  The patient has had significant chronic coronary artery disease and was recently having a transcatheter aortic valve replacement procedure and postoperatively she developed a persistent headache and blurred vision.  MRI brain was positive for multifocal acute infarcts.  While the patient has made significant improvement with issues such as her vision and other neurological/cognitive areas the patient is continued to have a lot of worries about ongoing stroke risk and managing and coping with post stroke effects both neurologically and emotionally.  The patient has made significant improvement from her postoperative cerebrovascular event with multifocal acute infarcts primarily with the right PCA territory but and other varying regions outside of her occipital and cerebral brain regions.  The patient reports that for the most part she has returned back to her baseline with some residual visual changes but significantly improved.  The patient has severe sleep deprivation which is likely causing some issues with cognition and mood and does describe visual hallucinations at night which are likely directly related to her sleep disturbance.  There are no indications of any progressive degenerative dementia she has outside of expected effects due to her cerebrovascular events and severe coronary artery disease and overall metabolic status related to type 2 diabetes and hyperlipidemia.  Disposition/Plan:  The patient essentially had a number of very specific questions she wanted to ask regarding her risk factors and what she may be able to do and what to expect going forward as far as cognitive and neuropsychological/neurological functioning  giving her medical status.  A significant amount of time was spent during our session today answering her questions regarding risk factors and potential and how that may impact her cognitive functioning and what she could do as far as limiting some of these risk factors as well as working on issues related to future planning.  At this point, I do not think that the patient will need to return for any further neuropsychological interventions but I do remain available to the patient and explained to her how to set up an appointment if need be going into the future.  The patient was quite open and engaging throughout and reported that she felt like I answered all of her pertinent questions.  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

## 2020-01-25 NOTE — Progress Notes (Signed)
Thank you :)

## 2020-01-26 ENCOUNTER — Encounter: Payer: Self-pay | Admitting: Physician Assistant

## 2020-01-26 ENCOUNTER — Ambulatory Visit: Payer: Medicare Other | Admitting: Physician Assistant

## 2020-01-26 ENCOUNTER — Ambulatory Visit (HOSPITAL_COMMUNITY): Payer: Medicare Other | Attending: Cardiovascular Disease

## 2020-01-26 VITALS — BP 168/70 | HR 100 | Ht 62.25 in | Wt 142.0 lb

## 2020-01-26 DIAGNOSIS — I350 Nonrheumatic aortic (valve) stenosis: Secondary | ICD-10-CM | POA: Diagnosis not present

## 2020-01-26 DIAGNOSIS — Z952 Presence of prosthetic heart valve: Secondary | ICD-10-CM

## 2020-01-26 DIAGNOSIS — I10 Essential (primary) hypertension: Secondary | ICD-10-CM

## 2020-01-26 DIAGNOSIS — I6523 Occlusion and stenosis of bilateral carotid arteries: Secondary | ICD-10-CM

## 2020-01-26 DIAGNOSIS — I6310 Cerebral infarction due to embolism of unspecified precerebral artery: Secondary | ICD-10-CM | POA: Diagnosis not present

## 2020-01-26 LAB — ECHOCARDIOGRAM COMPLETE
AR max vel: 2.31 cm2
AV Area VTI: 2.33 cm2
AV Area mean vel: 2.52 cm2
AV Mean grad: 7.3 mmHg
AV Peak grad: 12.1 mmHg
Ao pk vel: 1.74 m/s
Area-P 1/2: 3.17 cm2
MV VTI: 2.17 cm2
P 1/2 time: 277 msec
S' Lateral: 2 cm

## 2020-01-26 MED ORDER — CLONIDINE HCL 0.1 MG PO TABS: 0.1000 mg | ORAL_TABLET | Freq: Every evening | ORAL | 3 refills | Status: DC

## 2020-01-26 NOTE — Progress Notes (Signed)
[facility] [place]                                       Cardiology Office Note    Date:  01/27/2020   ID:  Sandar Krinke, DOB Feb 21, 1941, MRN [id number]  PCP:  [name], MD  Cardiologist: Sinclair Grooms, MD / Dr. Burt Knack & Dr. Roxy Manns (TAVR)  CC: 1 month s/p TAVR  History of Present Illness:  Cheryl Hernandez is a 79 y.o. 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   Patient states that she has known of presence of a heart murmur for many years. In 2009 she suffered an acute non-ST segment elevation myocardial infarction for which she was treated with PCI and stenting of the right coronary artery. She has been followed carefully ever since by Dr. [name]. She reported a 2-3 year hx of progressive exertional shortness of breath and fatigue. Echocardiograms have documented the presence of aortic stenosis that has gradually increased in severity. Diagnostic cardiac catheterization was performed by Dr. [name] on Aug 08, 2019. 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 Catheterization also confirmed the presence of aortic stenosis with peak to peak and mean transvalvular gradients measured 33 and 29 mmHg respectively, corresponding to aortic valve area calculated only 0.81 cm. Right-sided pressures were normal.Follow-up transthoracic echocardiogram was performed July 9, 2021revealing findings consistent with normal left ventricular systolic function but paradoxical severe low-flow low gradient aortic stenosis.  She was  evaluated by the multidisciplinary valve team and underwent successful TAVR with a 26 mm Medtronic Evolut Pro + THV via the TF approach on 12/20/19. Post operative echo showed EF 70%, normally functioning TAVR with a mean gradient of 10.5 mmHg and no PVL. Mild-mod MR. Unfortunately, pt developed a persistent headache and blurry vision s/p TAVR. 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 She was seen by neurology and underwent full stroke work up. She was discharged on aspirin and plavix. She was discharged with a Zio patch which showed no atrial fibrillation. In follow up she complained of persistent visual issues and hallucinations. She was referred to Dr. Chauncy Passy with neuro psych.  Today she presents to clinic for follow up. Has some mild dyspnea. Visual disturbance improving. Hallucinations also improving, which are felt to be related to insomnia. No chest pain. No CP. No LE edema, orthopnea or PND. No dizziness or syncope. No blood in stool or urine. No palpitations.     Past Medical History:  Diagnosis Date  . Arthritis   . Asthma   . CAD (coronary artery disease)    a. NSTEMI 2009 s/p DES to RCAx2.  . Carotid artery disease ([place])    a. Mild-moderate - followed by [facility].  . Chronic bronchitis ([place])    per patient  . Chronic diarrhea   . Diabetes mellitus without complication ([place])   . Diverticulosis   . GERD (gastroesophageal reflux disease)   . HTN (hypertension)   . Hx of migraines    as a child  . Hyperlipidemia   .  Myocardial infarction [facility])    per patient "about 2011"  . Neuropathy    per patient in feet, legs, and some in hands  . Obesity   . Paroxysmal ventricular tachycardia ([place])   . Rhinitis   . Rotator cuff tear   . 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 Burt Knack and Dr. Roxy Manns   . Severe aortic stenosis     Past Surgical History:  Procedure Laterality Date    . ANGIOPLASTY    . CORONARY ANGIOPLASTY WITH STENT PLACEMENT    . RIGHT/LEFT HEART CATH AND CORONARY ANGIOGRAPHY N/A 08/08/2019   Procedure: RIGHT/LEFT HEART CATH AND CORONARY ANGIOGRAPHY;  Surgeon: Belva Crome, MD;  Location: [facility];  Service: Cardiovascular;  Laterality: N/A;  . TEE WITHOUT CARDIOVERSION N/A 12/20/2019   Procedure: TRANSESOPHAGEAL ECHOCARDIOGRAM (TEE);  Surgeon: Sherren Mocha, MD;  Location: [facility];  Service: Open Heart Surgery;  Laterality: N/A;  . TONSILLECTOMY    . TOTAL ABDOMINAL HYSTERECTOMY    . TOTAL HIP ARTHROPLASTY    . TRANSCATHETER AORTIC VALVE REPLACEMENT, TRANSFEMORAL N/A 12/20/2019   Procedure: TRANSCATHETER AORTIC VALVE REPLACEMENT, TRANSFEMORAL;  Surgeon: Sherren Mocha, MD;  Location: [facility];  Service: Open Heart Surgery;  Laterality: N/A;    Current Medications: Outpatient Medications Prior to Visit  Medication Sig Dispense Refill  . acetaminophen (TYLENOL) 500 MG tablet Take 500 mg by mouth every 6 (six) hours as needed (for arthritis pain.).    Marland Kitchen albuterol (VENTOLIN HFA) 108 (90 Base) MCG/ACT inhaler Inhale 2 puffs into the lungs every 6 (six) hours as needed for wheezing or shortness of breath. 8 g 3  . Alpha-D-Galactosidase (BEANO PO) Take 1 tablet by mouth daily as needed (flatulence/abdominal bloating.).    Marland Kitchen amoxicillin (AMOXIL) 500 MG tablet Take 2,000 mg by mouth as directed. Take 2,000 mg one hour prior to all dental visits.    Marland Kitchen aspirin EC 81 MG tablet Take 162 mg by mouth every evening.     . B Complex-C (B-COMPLEX WITH VITAMIN C) tablet Take 1 tablet by mouth daily.     . budesonide (ENTOCORT EC) 3 MG 24 hr capsule Take 2 capsules (6 mg total) by mouth 2 (two) times daily as needed (diarrhea). 360 capsule 2  . budesonide-formoterol (SYMBICORT) 80-4.5 MCG/ACT inhaler Inhale 2 puffs into the lungs 2 (two) times daily. 1 Inhaler 11  . BYSTOLIC 5 MG tablet TAKE ONE TABLET BY MOUTH DAILY (Patient taking  differently: Take 2.5 mg by mouth daily. ) 90 tablet 3  . calcium carbonate (TUMS - DOSED IN MG ELEMENTAL CALCIUM) 500 MG chewable tablet Chew 1 tablet by mouth 3 (three) times daily as needed for heartburn.     . Cholecalciferol (VITAMIN D3) 75 MCG (3000 UT) TABS Take 3,000 Units by mouth every other day. In the morning    . clopidogrel (PLAVIX) 75 MG tablet Take 1 tablet (75 mg total) by mouth daily with breakfast. 90 tablet 1  . Evolocumab (REPATHA SURECLICK) 694 MG/ML SOAJ Inject 1 pen into the skin every 14 (fourteen) days. 2 pen 11  . fexofenadine (ALLEGRA) 180 MG tablet Take 180 mg by mouth daily.      . Glucose Blood (FREESTYLE LITE TEST VI) Once daily    . guaifenesin (HUMIBID E) 400 MG TABS tablet Take 400 mg by mouth daily.    . Magnesium  Citrate 125 MG CAPS Take 125 mg by mouth daily.    . metFORMIN (GLUCOPHAGE-XR) 500 MG 24 hr tablet Take 500 mg by mouth daily.    . Multiple Vitamins-Minerals (MULTIVITAMIN WITH MINERALS) tablet Take 1 tablet by mouth every other day. In the morning    . nitroGLYCERIN (NITROSTAT) 0.4 MG SL tablet Place 1 tablet (0.4 mg total) under the tongue every 5 (five) minutes as needed for chest pain. (Patient taking differently: Place 0.4 mg under the tongue every 5 (five) minutes x 3 doses as needed for chest pain. ) 25 tablet 3  . Omega-3 1000 MG CAPS Take 1,000 mg by mouth every other day. In the morning.    Marland Kitchen OVER THE COUNTER MEDICATION Take 1 capsule by mouth every other day. Circulation and vein     . PHOSPHATIDYL CHOLINE PO Take 1 capsule by mouth every other day. Jeannetta Nap Phosphatidyl Choline Liver &Brain    . Probiotic Product (ALIGN) 4 MG CAPS Take 4 mg by mouth every other day.    Marland Kitchen Respiratory Therapy Supplies (FLUTTER) DEVI 1 each every other day. Use flutter valve 1 times daily as needed    . temazepam (RESTORIL) 15 MG capsule Take 15 mg by mouth at bedtime as needed for sleep.    . Turmeric 400 MG CAPS Take 400 mg by mouth every other day. In  the morning.    . vitamin B-12 (CYANOCOBALAMIN) 1000 MCG tablet Take 1,000 mcg by mouth every other day.     No facility-administered medications prior to visit.     Allergies:   Cefuroxime axetil, Cephalexin, Codeine, Doxycycline, Erythromycin base, and Statins   Social History   Socioeconomic History  . Marital status: Widowed    Spouse name: Not on file  . Number of children: Not on file  . Years of education: Not on file  . Highest education level: Not on file  Occupational History  . Occupation: retired Therapist, sports  Tobacco Use  . Smoking status: Former Smoker    Packs/day: 1.00    Years: 30.00    Pack years: 30.00    Types: Cigarettes    Quit date: 03/31/1998    Years since quitting: 21.8  . Smokeless tobacco: Never Used  . Tobacco comment: Quit intermittently since her 57s.  Vaping Use  . Vaping Use: Never used  Substance and Sexual Activity  . Alcohol use: Yes    Alcohol/week: 0.0 standard drinks    Comment: Rare.  . Drug use: No  . Sexual activity: Not on file  Other Topics Concern  . Not on file  Social History Narrative   Originally from [place]. She has traveled to [place], [place], [place], & [place]. Previously worked as a Marine scientist. No known TB exposure. No pets currently. No bird, hot tube, or asbestos exposure. Enjoys playing bridge.    Social Determinants of Health   Financial Resource Strain:   . Difficulty of Paying Living Expenses: Not on file  Food Insecurity:   . Worried About Charity fundraiser in the Last Year: Not on file  . Ran Out of Food in the Last Year: Not on file  Transportation Needs:   . Lack of Transportation (Medical): Not on file  . Lack of Transportation (Non-Medical): Not on file  Physical Activity:   . Days of Exercise per Week: Not on file  . Minutes of Exercise per Session: Not on file  Stress:   . Feeling of Stress : Not on file  Social Connections:   . Frequency of Communication with Friends and Family: Not on file  . Frequency of Social  Gatherings with Friends and Family: Not on file  . Attends Religious Services: Not on file  . Active Member of Clubs or Organizations: Not on file  . Attends Archivist Meetings: Not on file  . Marital Status: Not on file     Family History:  The patient's family history includes Allergies in her mother; Heart attack in her father; Hyperlipidemia in her mother; Prostate cancer in her maternal grandfather.     ROS:   Please see the history of present illness.    ROS All other systems reviewed and are negative.   PHYSICAL EXAM:   VS:  BP (!) 168/70   Pulse 100   Ht 5' 2.25" (1.581 m)   Wt 142 lb (64.4 kg)   SpO2 95%   BMI 25.76 kg/m    GEN: Well nourished, well developed, in no acute distress HEENT: normal Neck: no JVD or masses Cardiac: RRR; soft flow murmur. No rubs, or gallops,no edema  Respiratory:  clear to auscultation bilaterally, normal work of breathing GI: soft, nontender, nondistended, + BS MS: no deformity or atrophy Skin: warm and dry, no rash.  Neuro:  Alert and Oriented x 3, Strength and sensation are intact Psych: euthymic mood, full affect   Wt Readings from Last 3 Encounters:  01/26/20 142 lb (64.4 kg)  01/03/20 141 lb (64 kg)  12/29/19 139 lb 3.2 oz (63.1 kg)      Studies/Labs Reviewed:   EKG:  EKG is NOT ordered today.    Recent Labs: 07/25/2019: NT-Pro BNP 183 12/16/2019: ALT 17; B Natriuretic Peptide 117.4 12/21/2019: Magnesium 1.6 12/22/2019: BUN 8; Creatinine, Ser 0.86; Hemoglobin 11.3; Platelets 207; Potassium 3.6; Sodium 138   Lipid Panel    Component Value Date/Time   CHOL 157 12/22/2019 0841   CHOL 150 07/04/2019 0749   TRIG 151 (H) 12/22/2019 0841   HDL 67 12/22/2019 0841   HDL 68 07/04/2019 0749   CHOLHDL 2.3 12/22/2019 0841   VLDL 30 12/22/2019 0841   LDLCALC 60 12/22/2019 0841   LDLCALC 64 07/04/2019 0749    Additional studies/ records that were reviewed today include:  TAVR OPERATIVE NOTE   Date of  Procedure:12/20/2019  Preoperative Diagnosis:Severe Aortic Stenosis   Postoperative Diagnosis:Same   Procedure:   Transcatheter Aortic Valve Replacement - PercutaneousRightTransfemoral Approach Medtronic CoreValve Evolut Pro (size 57mm, serial # [id number])  Co-Surgeons:Clarence H. Roxy Manns, MD and Sherren Mocha, MD  Anesthesiologist:Charlene Nyoka Cowden, MD  Echocardiographer:Mihai Croitoru, MD  Pre-operative Echo Findings: ? Severe aortic stenosis ? Normalleft ventricular systolic function  Post-operative Echo Findings: ? Noparavalvular leak ? Normalleft ventricular systolic function  _____________   Echo 12/21/19:  IMPRESSIONS  1. A 3.5 m/s intracavitary "gradient" is present due to hyperdynamic left  ventricular contraction. Left ventricular ejection fraction, by  estimation, is 70 to 75%. The left ventricle has hyperdynamic function.  The left ventricle has no regional wall  motion abnormalities. Left ventricular diastolic parameters are consistent  with Grade II diastolic dysfunction (pseudonormalization). Elevated left  atrial pressure.  2. Right ventricular systolic function is normal. The right ventricular  size is normal. There is mildly elevated pulmonary artery systolic  pressure.  3. The mitral valve is normal in structure. Mild to moderate mitral valve  regurgitation. No evidence of mitral stenosis. Moderate mitral annular  calcification.  4. The aortic valve has been repaired/replaced. Aortic valve  regurgitation is not visualized. No aortic stenosis is present. There is a  CoreValve-Evolut Pro prosthetic (TAVR) valve present in the aortic  position. Procedure Date: 12/20/2019.  5. The inferior vena cava is normal in size with greater than 50%  respiratory variability, suggesting right atrial pressure of 3 mmHg.    __________________  ZIo AT 12/22/19 Study Highlights   Basic rhythm is NSR  Non Sustained SVT at rates 115 to 214 bpm. Longest 11 seconds  No atrial fibrillation  SVT burden < 1%   _________________  Echo 01/26/20 IMPRESSIONS  1. 26 mm Evolute Pro. Vmax 1.74 m/s, MG 7.2 mmHG, EOA 2.33 cm2, DI 0.74.  Mild paravalvular leak is the 5-6 o'clock position. The aortic valve has  been repaired/replaced. Aortic valve regurgitation is mild. There is a 26  mm CoreValve-Evolut Pro  prosthetic (TAVR) valve present in the aortic position. Procedure Date:  12/20/2019.  2. Left ventricular ejection fraction, by estimation, is 65 to 70%. The  left ventricle has normal function. The left ventricle has no regional  wall motion abnormalities. Left ventricular diastolic function could not  be evaluated.  3. Right ventricular systolic function is normal. The right ventricular  size is normal. There is normal pulmonary artery systolic pressure. The  estimated right ventricular systolic pressure is 64.3 mmHg.  4. Left atrial size was mildly dilated.  5. Mild calcific mitral stenosis (degnerative). MVA by VTI 2.2 cm2. The  mitral valve is degenerative. Mild mitral valve regurgitation. Mild mitral  stenosis. The mean mitral valve gradient is 6.6 mmHg with average heart  rate of 83 bpm. Moderate to severe  mitral annular calcification.  6. The inferior vena cava is normal in size with greater than 50%  respiratory variability, suggesting right atrial pressure of 3 mmHg.   Comparison(s): Changes from prior study are noted. Mild PVL is now  present.   ASSESSMENT & PLAN:   Severe AS s/p TAVR: echo today shows EF 65%, normally functioning TAVR with a mean gradient of 7 mm hg and new mild PVL. She has NYHA class II symptoms. SBE prophylaxis discussed; she has amoxicillin. Continue ASA and Plavix. Plavix can be discontinued after 6 months of therapy (05/2020)  Post operative CVA: seen by Dr.  Chauncy Passy with neuropsychology who felt her hallucinations were from chronic insomnia. Improving overall from a neurologic standpoint.   Carotid artery disease: pre TAVR dopplers showed 32-95% LICA stenosis. Continue antiplatelet therapy. Intolerant to statins. On Repatha. Followed by Dr. Donnetta Hutching.  HTN: BP elevated. 150/70 on my personal recheck. Pt has chronic insomnia and neuropsychiatry thinks visual hallucinations are a results of lack of sleep. Given elevated BP, will try clonidine at night to see if this helps her BP and sleep. She will keep a log of her BPs over the next week or so and message it to me.    Medication Adjustments/Labs and Tests Ordered: Current medicines are reviewed at length with the patient today.  Concerns regarding medicines are outlined above.  Medication changes, Labs and Tests ordered today are listed in the Patient Instructions below. Patient Instructions  Medication Instructions:  1) START CLONIDINE 0.1 mg at bed time 2) you may STOP PLAVIX in March 2022 when your pills run out *If you need a refill on your cardiac medications before your next appointment, please call your pharmacy*   Follow-Up: You will be called to arrange your 1 year echo and office visit!  Please send [place] a message with your BP readings a week.  Signed, Angelena Form, PA-C  01/27/2020 11:27 AM    [facility] [address], [place], [place]  [address] Phone: [phone]; Fax: [phone]

## 2020-01-26 NOTE — Patient Instructions (Addendum)
Medication Instructions:  1) START CLONIDINE 0.1 mg at bed time 2) you may STOP PLAVIX in March 2022 when your pills run out *If you need a refill on your cardiac medications before your next appointment, please call your pharmacy*   Follow-Up: You will be called to arrange your 1 year echo and office visit!  Please send [place] a message with your BP readings a week.

## 2020-01-27 ENCOUNTER — Telehealth: Payer: Self-pay

## 2020-01-27 NOTE — Telephone Encounter (Signed)
Okay with me.  Thank you °

## 2020-01-27 NOTE — Telephone Encounter (Signed)
Per patient request, will initiate protocol to switch providers from Dr. [name] to Dr. Johney Frame. She states she has enjoyed her recent female providers and would like to follow with a female cardiologist long term.  To Dr. [name] and Dr. Johney Frame to document their decisions in this encounter.

## 2020-01-28 NOTE — Telephone Encounter (Signed)
Great idea. I am in agreement.

## 2020-01-30 NOTE — Telephone Encounter (Signed)
Scheduled patient to establish care with Dr. Johney Frame 05/25/2020. She was grateful for assistance.

## 2020-02-01 ENCOUNTER — Ambulatory Visit: Payer: Medicare Other | Admitting: Adult Health

## 2020-02-06 DIAGNOSIS — I10 Essential (primary) hypertension: Secondary | ICD-10-CM

## 2020-02-12 ENCOUNTER — Other Ambulatory Visit: Payer: Self-pay | Admitting: Physician Assistant

## 2020-02-12 MED ORDER — AMLODIPINE BESYLATE 5 MG PO TABS: 5.0000 mg | ORAL_TABLET | Freq: Every day | ORAL | 6 refills | Status: DC

## 2020-02-13 ENCOUNTER — Telehealth (HOSPITAL_COMMUNITY): Payer: Self-pay

## 2020-02-13 ENCOUNTER — Telehealth: Payer: Self-pay | Admitting: Cardiology

## 2020-02-13 NOTE — Telephone Encounter (Signed)
Pt called and wanted to know where she was with getting scheduling for CR. Adv pt of note below, pt stated she had planned on canceling the appt with neuro.  Patient will be contacted for scheduling upon review by the RN Navigator.

## 2020-02-13 NOTE — Telephone Encounter (Signed)
Follow Up:     Pt called back and said please call her on her cell phone [phone],

## 2020-02-13 NOTE — Telephone Encounter (Signed)
Called pt again. Discussed that [employer] unfortunately does not have patient assistance currently. Pt states this is 1 of 3 expensive medications she takes and inquires if she can only take Repatha once per month to help with the cost. Advised her that is not ideal. Will fax out [employer] Form application - unsure if they will start covering copays for Medicare patients since [facility] is currently closed. She is aware to mail these back when completed. Of note, she has changed her MD from Dr [name] to Dr Johney Frame who will need to sign application form.

## 2020-02-13 NOTE — Telephone Encounter (Signed)
Pt c/o medication issue:  1. Name of Medication:   Evolocumab (REPATHA SURECLICK) 748 MG/ML SOAJ    2. How are you currently taking this medication (dosage and times per day)? 1 pen in skin every 14 days   3. Are you having a reaction (difficulty breathing--STAT)? No   4. What is your medication issue? Cheryl Hernandez is calling stating her [employer] program for her Repatha runs out soon due to it only last a year. Cheryl Hernandez is wanting to know how she goes about reapplying due to her recent provider switch. Please advise.

## 2020-02-13 NOTE — Telephone Encounter (Signed)
Attempted to call pt back, no answer and mailbox was full.

## 2020-02-14 ENCOUNTER — Other Ambulatory Visit (HOSPITAL_COMMUNITY): Payer: Medicare Other

## 2020-02-15 ENCOUNTER — Other Ambulatory Visit: Payer: Self-pay | Admitting: Physician Assistant

## 2020-02-15 ENCOUNTER — Encounter: Payer: Self-pay | Admitting: Physician Assistant

## 2020-02-15 MED ORDER — LOSARTAN POTASSIUM 25 MG PO TABS: 25.0000 mg | ORAL_TABLET | Freq: Every day | ORAL | 6 refills | Status: DC

## 2020-02-16 NOTE — Telephone Encounter (Signed)
Scheduled patient for BMET 12/3. She was grateful for call and agrees with plan.

## 2020-02-27 ENCOUNTER — Other Ambulatory Visit: Payer: Self-pay | Admitting: Interventional Cardiology

## 2020-02-27 ENCOUNTER — Institutional Professional Consult (permissible substitution): Payer: Medicare Other | Admitting: Neurology

## 2020-03-02 ENCOUNTER — Other Ambulatory Visit: Payer: Medicare Other | Admitting: *Deleted

## 2020-03-02 ENCOUNTER — Other Ambulatory Visit: Payer: Self-pay

## 2020-03-02 DIAGNOSIS — I10 Essential (primary) hypertension: Secondary | ICD-10-CM

## 2020-03-02 LAB — BASIC METABOLIC PANEL
BUN/Creatinine Ratio: 22 (ref 12–28)
BUN: 16 mg/dL (ref 8–27)
CO2: 22 mmol/L (ref 20–29)
Calcium: 9.6 mg/dL (ref 8.7–10.3)
Chloride: 102 mmol/L (ref 96–106)
Creatinine, Ser: 0.72 mg/dL (ref 0.57–1.00)
GFR calc Af Amer: 91 mL/min/{1.73_m2} (ref >59)
GFR calc non Af Amer: 79 mL/min/{1.73_m2} (ref >59)
Glucose: 95 mg/dL (ref 65–99)
Potassium: 4.6 mmol/L (ref 3.5–5.2)
Sodium: 139 mmol/L (ref 134–144)

## 2020-03-09 NOTE — Telephone Encounter (Signed)
Called pt to follow up with SNF application since we never received completed forms. Pt states she isn't planning on filling it out. Advised her we do ideally want her to stay on Repatha (LDL 60 on therapy, previously as high as 222). Pt is aware and will plan to continue on Repatha as long as it's affordable.

## 2020-03-12 ENCOUNTER — Telehealth (HOSPITAL_COMMUNITY): Payer: Self-pay

## 2020-03-12 ENCOUNTER — Encounter (HOSPITAL_COMMUNITY): Payer: Self-pay

## 2020-03-12 NOTE — Telephone Encounter (Signed)
Attempted to call patient in regards to Cardiac Rehab - LM on VM Mailed letter 

## 2020-03-21 ENCOUNTER — Ambulatory Visit: Payer: Medicare Other | Admitting: Podiatry

## 2020-03-21 ENCOUNTER — Telehealth (HOSPITAL_COMMUNITY): Payer: Self-pay

## 2020-03-21 NOTE — Telephone Encounter (Signed)
Pt returned CR phone call and stated she is interested in CR. Patient will come in for orientation on 04/26/20 @ 1:15PM and will attend the 1:15PM exercise class.  Tourist information centre manager.

## 2020-04-02 ENCOUNTER — Telehealth: Payer: Self-pay | Admitting: Pharmacist

## 2020-04-02 MED ORDER — REPATHA SURECLICK 140 MG/ML ~~LOC~~ SOAJ: SUBCUTANEOUS | 3 refills | Status: DC

## 2020-04-02 NOTE — Telephone Encounter (Signed)
Received notification that [employer] has re opened this AM. Called pt to re enroll her in grant to help with Repatha copay. Called grant info into pharmacy. Pt was very grateful for assistance.

## 2020-04-04 ENCOUNTER — Telehealth (HOSPITAL_COMMUNITY): Payer: Self-pay

## 2020-04-04 NOTE — Telephone Encounter (Signed)
Pt insurance is active and benefits verified through [facility] Medicare. Co-pay $0.00, DED $0.00/$0.00 met, out of pocket $3,600.00/$0.00 met, co-insurance 0%. No pre-authorization required. Passport, 04/04/20 @ 11:55AM, [facility]#[id number]

## 2020-04-16 ENCOUNTER — Telehealth (HOSPITAL_COMMUNITY): Payer: Self-pay | Admitting: Pharmacy Technician

## 2020-04-23 ENCOUNTER — Telehealth (HOSPITAL_COMMUNITY): Payer: Self-pay

## 2020-04-23 NOTE — Telephone Encounter (Signed)
Cardiac Rehab Telephone Note:  Unsuccessful telephone encounter to Sultana A. Cando to confirm cardiac rehab orientation appointment for Thursday, 04/26/20 at 1:15 pm. [employer] compliant VM message left requesting callback to [phone].  Eniya Cannady E. Rollene Rotunda RN, BSN [place]. [facility]  Cardiac and Pulmonary Rehabilitation Phone: [phone] Fax: [phone]

## 2020-04-24 ENCOUNTER — Encounter (HOSPITAL_COMMUNITY)
Admission: RE | Admit: 2020-04-24 | Discharge: 2020-04-24 | Disposition: A | Discharge status: Home / Self Care | Payer: Medicare Other | Source: Ambulatory Visit | Attending: Cardiology | Admitting: Cardiology

## 2020-04-24 ENCOUNTER — Other Ambulatory Visit: Payer: Self-pay

## 2020-04-24 DIAGNOSIS — Z952 Presence of prosthetic heart valve: Secondary | ICD-10-CM | POA: Insufficient documentation

## 2020-04-24 NOTE — Progress Notes (Signed)
Cardiac Rehab Telephone Note:  Successful telephone encounter to Cheryl Hernandez to confirm Cardiac Rehab orientation appointment for 04/26/20 at 1:15 pm. Nursing assessment completed. Patient questions answered. Instructions for appointment provided. Patient screening for Covid-19 negative.  Hedda Crumbley E. Rollene Rotunda RN, BSN [place]. [facility]  Cardiac and Pulmonary Rehabilitation Phone: [phone] Fax: [phone]

## 2020-04-25 ENCOUNTER — Telehealth (HOSPITAL_COMMUNITY): Payer: Self-pay | Admitting: Pharmacy Technician

## 2020-04-25 NOTE — Telephone Encounter (Signed)
Cardiac Rehab Medication Review by a Pharmacist  Does the patient  feel that his/her medications are working for him/her?  yes  Has the patient been experiencing any side effects to the medications prescribed? SOB, heart beats fast when moving  Does the patient measure his/her own blood pressure or blood glucose at home? yes - BP ranges from 115/67 - 165/79 mmHg; Patient reports her blood pressure is well controlled at rest and is only above goal with activity. Encouraged patient to bring blood pressure readings with her to her appointment.  Does the patient have any problems obtaining medications due to transportation or finances?   no  Understanding of regimen: excellent Understanding of indications: good Potential of compliance: good   Pharmacist Intervention: N/A   Romilda Garret, PharmD PGY1 Acute Care Pharmacy Resident Phone: [phone] 04/25/2020 3:02 PM  Please check [email or web address] for unit specific pharmacy phone numbers.

## 2020-04-25 NOTE — Addendum Note (Signed)
Addended by: Romilda Garret on: 04/25/2020 03:37 PM   Modules accepted: Orders

## 2020-04-26 ENCOUNTER — Other Ambulatory Visit: Payer: Self-pay

## 2020-04-26 ENCOUNTER — Encounter (HOSPITAL_COMMUNITY)
Admission: RE | Admit: 2020-04-26 | Discharge: 2020-04-26 | Disposition: A | Discharge status: Home / Self Care | Payer: Medicare Other | Source: Ambulatory Visit | Attending: Cardiology | Admitting: Cardiology

## 2020-04-26 VITALS — BP 128/60 | Ht 63.0 in | Wt 143.5 lb

## 2020-04-26 DIAGNOSIS — Z952 Presence of prosthetic heart valve: Secondary | ICD-10-CM

## 2020-04-27 ENCOUNTER — Encounter (HOSPITAL_COMMUNITY): Payer: Self-pay

## 2020-04-27 NOTE — Progress Notes (Signed)
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  acetaminophen (TYLENOL) 500 MG tablet, Take 500 mg by mouth every 6 (six) hours as needed (for arthritis pain.)., Disp: , Rfl:  .  albuterol (VENTOLIN HFA) 108 (90 Base) MCG/ACT inhaler, Inhale 2 puffs into the lungs every 6 (six) hours as needed for wheezing or shortness of breath., Disp: 8 g, Rfl: 3 .  Alpha-D-Galactosidase (BEANO PO), Take 1 tablet by mouth daily as needed (flatulence/abdominal bloating.)., Disp: , Rfl:  .  amoxicillin (AMOXIL) 500 MG tablet, Take 2,000 mg by mouth as directed. Take 2,000 mg one hour prior to all dental visits., Disp: , Rfl:  .  aspirin EC 81 MG tablet, Take 81 mg by mouth every evening., Disp: , Rfl:  .  B Complex-C (B-COMPLEX WITH VITAMIN C) tablet, Take 1 tablet by mouth daily. , Disp: , Rfl:  .  budesonide (ENTOCORT EC) 3 MG 24 hr capsule, Take 2 capsules (6 mg total) by mouth 2 (two) times daily as needed (diarrhea)., Disp: 360 capsule, Rfl: 2 .  budesonide-formoterol (SYMBICORT) 80-4.5 MCG/ACT inhaler, Inhale 2 puffs into the lungs 2 (two) times daily. (Patient taking differently: Inhale 2 puffs into the lungs 2 (two) times daily as needed (congestion).), Disp: 1 Inhaler, Rfl: 11 .  BYSTOLIC 5 MG tablet, TAKE ONE TABLET BY MOUTH DAILY (Patient taking differently: Take 2.5 mg by mouth daily.), Disp: 90 tablet, Rfl: 3 .  calcium carbonate (TUMS  - DOSED IN MG ELEMENTAL CALCIUM) 500 MG chewable tablet, Chew 1 tablet by mouth 3 (three) times daily as needed for heartburn. , Disp: , Rfl:  .  Cholecalciferol (VITAMIN D3) 75 MCG (3000 UT) TABS, Take 3,000 Units by mouth every other day., Disp: , Rfl:  .  clopidogrel (PLAVIX) 75 MG tablet, Take 1 tablet (75 mg total) by mouth daily with breakfast., Disp: 90 tablet, Rfl: 1 .  Evolocumab (REPATHA SURECLICK) 229 MG/ML SOAJ, INJECT 1 PEN INTO THE SKIN EVERY 14 DAYS (Patient taking differently: Inject 140 mg into the skin every 14 (fourteen) days.), Disp: 6 mL, Rfl: 3 .  fexofenadine (ALLEGRA) 180 MG tablet, Take 180 mg by mouth daily., Disp: , Rfl:  .  Glucose Blood (FREESTYLE LITE TEST VI), Once daily, Disp: , Rfl:  .  guaifenesin (HUMIBID E) 400 MG TABS tablet, Take 400 mg by mouth daily., Disp: , Rfl:  .  KRILL OIL PO, Take 1 capsule by mouth daily., Disp: , Rfl:  .  losartan (COZAAR) 25 MG tablet, Take 1 tablet (25 mg total) by mouth daily., Disp: 30 tablet, Rfl: 6 .  Magnesium Citrate 125 MG CAPS, Take 125 mg by mouth daily., Disp: , Rfl:  .  metFORMIN (GLUCOPHAGE-XR) 500 MG 24 hr tablet, Take 500 mg by mouth daily., Disp: , Rfl:  .  Multiple Vitamins-Minerals (MULTIVITAMIN WITH MINERALS) tablet, Take 1 tablet by mouth every other day. In the morning, Disp: , Rfl:  .  nitroGLYCERIN (NITROSTAT) 0.4 MG SL  tablet, Place 1 tablet (0.4 mg total) under the tongue every 5 (five) minutes as needed for chest pain. (Patient taking differently: Place 0.4 mg under the tongue every 5 (five) minutes x 3 doses as needed for chest pain.), Disp: 25 tablet, Rfl: 3 .  Probiotic Product (ALIGN) 4 MG CAPS, Take 4 mg by mouth every other day., Disp: , Rfl:  .  vitamin B-12 (CYANOCOBALAMIN) 1000 MCG tablet, Take 1,000 mcg by mouth every other day., Disp: , Rfl:  .  zolpidem (AMBIEN) 5 MG tablet, Take 5 mg by mouth at bedtime as needed for sleep., Disp: , Rfl:   Past Medical History: Past Medical History:  Diagnosis  Date  . Arthritis   . Asthma   . CAD (coronary artery disease)    a. NSTEMI 2009 s/p DES to RCAx2.  . Carotid artery disease ([place])    a. Mild-moderate - followed by [facility].  . Chronic bronchitis ([place])    per patient  . Chronic diarrhea   . Diabetes mellitus without complication ([place])   . Diverticulosis   . GERD (gastroesophageal reflux disease)   . HTN (hypertension)   . Hx of migraines    as a child  . Hyperlipidemia   . Myocardial infarction [facility])    per patient "about 2011"  . Neuropathy    per patient in feet, legs, and some in hands  . Obesity   . Paroxysmal ventricular tachycardia ([place])   . Rhinitis   . Rotator cuff tear   . 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 Burt Knack and Dr. Roxy Manns   . Severe aortic stenosis     Tobacco Use: Social History   Tobacco Use  Smoking Status Former Smoker  . Packs/day: 1.00  . Years: 30.00  . Pack years: 30.00  . Types: Cigarettes  . Quit date: 03/31/1998  . Years since quitting: 22.0  Smokeless Tobacco Never Used  Tobacco Comment   Quit intermittently since her 79s.    Labs: Recent Review Flowsheet Data    Labs for ITP Cardiac and Pulmonary Rehab Latest Ref Rng & Units 12/20/2019 12/20/2019 12/20/2019 12/20/2019 12/22/2019   Cholestrol 0 - 200 mg/dL - - - - 157   LDLCALC 0 - 99 mg/dL - - - - 60   HDL >40 mg/dL - - - - 67   Trlycerides <150 mg/dL - - - - 151(H)   Hemoglobin A1c 4.8 - 5.6 % - - - - -   PHART 7.350 - 7.450 7.339(L) - - - -   PCO2ART 32.0 - 48.0 mmHg 44.6 - - - -   HCO3 20.0 - 28.0 mmol/L 24.0 - - - -   TCO2 22 - 32 mmol/L 25 23 22  21(L) -   ACIDBASEDEF 0.0 - 2.0 mmol/L 2.0 - - - -   O2SAT % 99.0 - - - -      Capillary Blood Glucose: Lab Results  Component Value Date   GLUCAP 110 (H) 12/22/2019   GLUCAP 104 (H) 12/22/2019   GLUCAP 107 (H) 12/21/2019   GLUCAP 153 (H) 12/21/2019   GLUCAP 121 (H) 12/21/2019      Exercise Target Goals: Exercise Program Goal: Individual exercise prescription set using results from initial 6 min walk test and THRR while considering  patient's activity barriers and safety.   Exercise Prescription Goal: Starting with aerobic activity 30 plus minutes a day,  3 days per week for initial exercise prescription. Provide home exercise prescription and guidelines that participant acknowledges understanding prior to discharge.  Activity Barriers & Risk Stratification:  Activity Barriers & Cardiac Risk Stratification - 04/26/20 1549      Activity Barriers & Cardiac Risk Stratification   Activity Barriers Left Knee Replacement;Right Knee Replacement;Balance Concerns;Shortness of Breath;Deconditioning;Joint Problems;Other (comment)    Comments Bilateral foot pain    Cardiac Risk Stratification High           6 Minute Walk:  6 Minute Walk    Row Name 04/26/20 1355         6 Minute Walk   Phase Initial     Distance 780 feet     Walk Time 6 minutes     # of Rest Breaks 1  pt sat from 3.07-5.10     MPH 1.5     METS 1.7     RPE 13     Perceived Dyspnea  3     VO2 Peak 6.09     Resting HR 87 bpm     Resting BP 128/60     Resting Oxygen Saturation  97 %     Exercise Oxygen Saturation  during 6 min walk 95 %     Max Ex. HR 124 bpm     Max Ex. BP 140/70     2 Minute Post BP 128/66            Oxygen Initial Assessment:   Oxygen Re-Evaluation:   Oxygen Discharge (Final Oxygen Re-Evaluation):   Initial Exercise Prescription:  Initial Exercise Prescription - 04/26/20 1500      Date of Initial Exercise RX and Referring Provider   Date 04/26/20    Referring Provider Gwyndolyn Kaufman, MD    Expected Discharge Date 06/22/20      NuStep   Level 1    SPM 80    Minutes 30    METs 1.7      Prescription Details   Frequency (times per week) 3    Duration Progress to 30 minutes of continuous aerobic without signs/symptoms of physical distress       Intensity   THRR 40-80% of Max Heartrate 56-111    Ratings of Perceived Exertion 11-13    Perceived Dyspnea 0-4      Progression   Progression Continue progressive overload as per policy without signs/symptoms or physical distress.      Resistance Training   Training Prescription Yes    Weight 2    Reps 10-15           Perform Capillary Blood Glucose checks as needed.  Exercise Prescription Changes:   Exercise Comments:   Exercise Goals and Review:  Exercise Goals    Row Name 04/26/20 1556             Exercise Goals   Increase Physical Activity Yes       Expected Outcomes Short Term: Attend rehab on a regular basis to increase amount of physical activity.;Long Term: Add in home exercise to make exercise part of routine and to increase amount of physical activity.;Long Term: Exercising regularly at least 3-5 days a week.       Increase Strength and Stamina Yes       Intervention Provide advice, education, support and counseling about physical activity/exercise needs.;Develop an individualized exercise prescription for aerobic and resistive training based on initial evaluation findings, risk stratification, comorbidities and participant's personal goals.  Expected Outcomes Short Term: Increase workloads from initial exercise prescription for resistance, speed, and METs.;Short Term: Perform resistance training exercises routinely during rehab and add in resistance training at home;Long Term: Improve cardiorespiratory fitness, muscular endurance and strength as measured by increased METs and functional capacity (6MWT)       Able to understand and use rate of perceived exertion (RPE) scale Yes       Intervention Provide education and explanation on how to use RPE scale       Expected Outcomes Short Term: Able to use RPE daily in rehab to express subjective intensity level;Long Term:  Able to use RPE to guide intensity level when exercising independently       Able to  understand and use Dyspnea scale Yes       Intervention Provide education and explanation on how to use Dyspnea scale       Expected Outcomes Short Term: Able to use Dyspnea scale daily in rehab to express subjective sense of shortness of breath during exertion;Long Term: Able to use Dyspnea scale to guide intensity level when exercising independently       Knowledge and understanding of Target Heart Rate Range (THRR) Yes       Intervention Provide education and explanation of THRR including how the numbers were predicted and where they are located for reference       Expected Outcomes Short Term: Able to state/look up THRR;Short Term: Able to use daily as guideline for intensity in rehab;Long Term: Able to use THRR to govern intensity when exercising independently       Understanding of Exercise Prescription Yes       Intervention Provide education, explanation, and written materials on patient's individual exercise prescription       Expected Outcomes Short Term: Able to explain program exercise prescription;Long Term: Able to explain home exercise prescription to exercise independently              Exercise Goals Re-Evaluation :    Discharge Exercise Prescription (Final Exercise Prescription Changes):   Nutrition:  Target Goals: Understanding of nutrition guidelines, daily intake of sodium 1500mg , cholesterol 200mg , calories 30% from fat and 7% or less from saturated fats, daily to have 5 or more servings of fruits and vegetables.  Biometrics:  Pre Biometrics - 04/26/20 1441      Pre Biometrics   Waist Circumference 37 inches    Hip Circumference 43.5 inches    Waist to Hip Ratio 0.85 %    Triceps Skinfold 28 mm    % Body Fat 39.7 %    Grip Strength 25 kg    Flexibility 14.25 in    Single Leg Stand 4 seconds            Nutrition Therapy Plan and Nutrition Goals:   Nutrition Assessments:  MEDIFICTS Score Key:  ?70 Need to make dietary changes   40-70 Heart  Healthy Diet  ? 40 Therapeutic Level Cholesterol Diet   Picture Your Plate Scores:  <30 Unhealthy dietary pattern with much room for improvement.  41-50 Dietary pattern unlikely to meet recommendations for good health and room for improvement.  51-60 More healthful dietary pattern, with some room for improvement.   >60 Healthy dietary pattern, although there may be some specific behaviors that could be improved.    Nutrition Goals Re-Evaluation:   Nutrition Goals Discharge (Final Nutrition Goals Re-Evaluation):   Psychosocial: Target Goals: Acknowledge presence or absence of significant depression and/or stress, maximize coping skills,  provide positive support system. Participant is able to verbalize types and ability to use techniques and skills needed for reducing stress and depression.  Initial Review & Psychosocial Screening:  Initial Psych Review & Screening - 04/27/20 0819      Initial Review   Current issues with None Identified      Family Dynamics   Good Support System? Yes   Cheryl Hernandez lives alone. Cheryl Hernandez has her sister who,lives near by for support     Barriers   Psychosocial barriers to participate in program There are no identifiable barriers or psychosocial needs.      Screening Interventions   Interventions Encouraged to exercise    Expected Outcomes Long Term Goal: Stressors or current issues are controlled or eliminated.;Short Term goal: Identification and review with participant of any Quality of Life or Depression concerns found by scoring the questionnaire.           Quality of Life Scores:  Quality of Life - 04/26/20 1600      Quality of Life   Select Quality of Life      Quality of Life Scores   Health/Function Pre 19.93 %    Socioeconomic Pre 27.3 %    Psych/Spiritual Pre 24.86 %    Family Pre 26 %    GLOBAL Pre 23.02 %          Scores of 19 and below usually indicate a poorer quality of life in these areas.  A difference of  2-3 points is  a clinically meaningful difference.  A difference of 2-3 points in the total score of the Quality of Life Index has been associated with significant improvement in overall quality of life, self-image, physical symptoms, and general health in studies assessing change in quality of life.  PHQ-9: Recent Review Flowsheet Data    Depression screen [facility] 2/9 04/27/2020   Decreased Interest 0   Down, Depressed, Hopeless 0   PHQ - 2 Score 0     Interpretation of Total Score  Total Score Depression Severity:  1-4 = Minimal depression, 5-9 = Mild depression, 10-14 = Moderate depression, 15-19 = Moderately severe depression, 20-27 = Severe depression   Psychosocial Evaluation and Intervention:   Psychosocial Re-Evaluation:   Psychosocial Discharge (Final Psychosocial Re-Evaluation):   Vocational Rehabilitation: Provide vocational rehab assistance to qualifying candidates.   Vocational Rehab Evaluation & Intervention:  Vocational Rehab - 04/27/20 6387      Initial Vocational Rehab Evaluation & Intervention   Assessment shows need for Vocational Rehabilitation No      Vocational Rehab Re-Evaulation   Comments Cheryl Hernandez is retired and does not need vocational rehab at this time           Education: Education Goals: Education classes will be provided on a weekly basis, covering required topics. Participant will state understanding/return demonstration of topics presented.  Learning Barriers/Preferences:  Learning Barriers/Preferences - 04/27/20 5643      Learning Barriers/Preferences   Learning Barriers None    Learning Preferences None           Education Topics: Hypertension, Hypertension Reduction -Define heart disease and high blood pressure. Discus how high blood pressure affects the body and ways to reduce high blood pressure.   Exercise and Your Heart -Discuss why it is important to exercise, the FITT principles of exercise, normal and abnormal responses to exercise, and  how to exercise safely.   Angina -Discuss definition of angina, causes of angina, treatment of angina, and how to decrease  risk of having angina.   Cardiac Medications -Review what the following cardiac medications are used for, how they affect the body, and side effects that may occur when taking the medications.  Medications include Aspirin, Beta blockers, calcium channel blockers, ACE Inhibitors, angiotensin receptor blockers, diuretics, digoxin, and antihyperlipidemics.   Congestive Heart Failure -Discuss the definition of CHF, how to live with CHF, the signs and symptoms of CHF, and how keep track of weight and sodium intake.   Heart Disease and Intimacy -Discus the effect sexual activity has on the heart, how changes occur during intimacy as we age, and safety during sexual activity.   Smoking Cessation / COPD -Discuss different methods to quit smoking, the health benefits of quitting smoking, and the definition of COPD.   Nutrition I: Fats -Discuss the types of cholesterol, what cholesterol does to the heart, and how cholesterol levels can be controlled.   Nutrition II: Labels -Discuss the different components of food labels and how to read food label   Heart Parts/Heart Disease and PAD -Discuss the anatomy of the heart, the pathway of blood circulation through the heart, and these are affected by heart disease.   Stress I: Signs and Symptoms -Discuss the causes of stress, how stress may lead to anxiety and depression, and ways to limit stress.   Stress II: Relaxation -Discuss different types of relaxation techniques to limit stress.   Warning Signs of Stroke / TIA -Discuss definition of a stroke, what the signs and symptoms are of a stroke, and how to identify when someone is having stroke.   Knowledge Questionnaire Score:  Knowledge Questionnaire Score - 04/26/20 1605      Knowledge Questionnaire Score   Pre Score 23/24           Core Components/Risk  Factors/Patient Goals at Admission:  Personal Goals and Risk Factors at Admission - 04/26/20 1606      Core Components/Risk Factors/Patient Goals on Admission    Weight Management Weight Loss;Yes;Weight Maintenance    Intervention Weight Management: Develop a combined nutrition and exercise program designed to reach desired caloric intake, while maintaining appropriate intake of nutrient and fiber, sodium and fats, and appropriate energy expenditure required for the weight goal.;Weight Management: Provide education and appropriate resources to help participant work on and attain dietary goals.;Weight Management/Obesity: Establish reasonable short term and long term weight goals.    Admit Weight 143 lb 8.3 oz (65.1 kg)    Improve shortness of breath with ADL's Yes    Intervention Provide education, individualized exercise plan and daily activity instruction to help decrease symptoms of SOB with activities of daily living.    Expected Outcomes Short Term: Improve cardiorespiratory fitness to achieve a reduction of symptoms when performing ADLs;Long Term: Be able to perform more ADLs without symptoms or delay the onset of symptoms    Diabetes Yes    Intervention Provide education about signs/symptoms and action to take for hypo/hyperglycemia.;Provide education about proper nutrition, including hydration, and aerobic/resistive exercise prescription along with prescribed medications to achieve blood glucose in normal ranges: Fasting glucose 65-99 mg/dL    Expected Outcomes Short Term: Participant verbalizes understanding of the signs/symptoms and immediate care of hyper/hypoglycemia, proper foot care and importance of medication, aerobic/resistive exercise and nutrition plan for blood glucose control.;Long Term: Attainment of HbA1C < 7%.    Hypertension Yes    Intervention Provide education on lifestyle modifcations including regular physical activity/exercise, weight management, moderate sodium restriction  and increased consumption of fresh fruit, vegetables,  and low fat dairy, alcohol moderation, and smoking cessation.;Monitor prescription use compliance.    Expected Outcomes Short Term: Continued assessment and intervention until BP is < 140/43mm HG in hypertensive participants. < 130/55mm HG in hypertensive participants with diabetes, heart failure or chronic kidney disease.;Long Term: Maintenance of blood pressure at goal levels.    Lipids Yes    Intervention Provide education and support for participant on nutrition & aerobic/resistive exercise along with prescribed medications to achieve LDL 70mg , HDL >40mg .    Expected Outcomes Short Term: Participant states understanding of desired cholesterol values and is compliant with medications prescribed. Participant is following exercise prescription and nutrition guidelines.;Long Term: Cholesterol controlled with medications as prescribed, with individualized exercise RX and with personalized nutrition plan. Value goals: LDL < 70mg , HDL > 40 mg.           Core Components/Risk Factors/Patient Goals Review:    Core Components/Risk Factors/Patient Goals at Discharge (Final Review):    ITP Comments:  ITP Comments    Row Name 04/26/20 1358           ITP Comments Dr Fransico Him Md, Medical Director              Comments: Cheryl Hernandez attended orientation on 04/27/2020 to review rules and guidelines for program.  Completed 6 minute walk test, Intitial ITP, and exercise prescription.  VSS. Telemetry-Sinus Rhythm. Cheryl Hernandez stopped to rest for over 2 minutes during her walk test due to shortness of breath rate of perceived exertion of a 3. Cheryl Hernandez was able to complete her walk test after rest. Oxygen saturations ranged from 95%-97%. Safety measures and social distancing in place per CDC guidelines.Barnet Pall, RN,BSN 04/27/2020 8:28 AM

## 2020-04-30 ENCOUNTER — Encounter (HOSPITAL_COMMUNITY)
Admission: RE | Admit: 2020-04-30 | Discharge: 2020-04-30 | Disposition: A | Discharge status: Home / Self Care | Payer: Medicare Other | Source: Ambulatory Visit | Attending: Cardiology | Admitting: Cardiology

## 2020-04-30 ENCOUNTER — Other Ambulatory Visit: Payer: Self-pay

## 2020-04-30 DIAGNOSIS — Z952 Presence of prosthetic heart valve: Secondary | ICD-10-CM

## 2020-04-30 LAB — GLUCOSE, CAPILLARY
Glucose-Capillary: 135 mg/dL — ABNORMAL HIGH (ref 70–99)
Glucose-Capillary: 105 mg/dL — ABNORMAL HIGH (ref 70–99)

## 2020-04-30 NOTE — Progress Notes (Signed)
Daily Session Note  Patient Details  Name: Cheryl Hernandez MRN: [id number] Date of Birth: 06/16/1940 Referring Provider:   Flowsheet Row CARDIAC REHAB PHASE II ORIENTATION from 04/26/2020 in [facility] CARD[facility]  Referring Provider Cheryl Pemberton, MD      Encounter Date: 04/30/2020  Check In:   Capillary Blood Glucose: No results found for this or any previous visit (from the past 24 hour(s)).    Social History   Tobacco Use  Smoking Status Former Smoker  . Packs/day: 1.00  . Years: 30.00  . Pack years: 30.00  . Types: Cigarettes  . Quit date: 03/31/1998  . Years since quitting: 22.1  Smokeless Tobacco Never Used  Tobacco Comment   Quit intermittently since her 20s.    Goals Met:  Strength training completed today  Goals Unmet:  Not Applicable  Comments: Pt started cardiac rehab today.  Pt tolerated light exercise without difficulty. VSS, telemetry-Sinus Rhythm, Cheryl Hernandez is deconditioned and had to rest after exercising on the nustep due to her COPD .  Medication list reconciled. Pt denies barriers to medicaiton compliance.  PSYCHOSOCIAL ASSESSMENT:  PHQ-0. Pt exhibits positive coping skills, hopeful outlook with supportive family. Will review quality of life with the patiuent.    Pt enjoys playing bridge and travelling.   Pt oriented to exercise equipment and routine.    Understanding verbalized. , RN,BSN 05/01/2020 4:26 PM   Dr. Traci Hernandez is Medical Director for Cardiac Rehab at [facility]. 

## 2020-05-02 ENCOUNTER — Encounter (HOSPITAL_COMMUNITY)
Admission: RE | Admit: 2020-05-02 | Discharge: 2020-05-02 | Disposition: A | Discharge status: Home / Self Care | Payer: Medicare Other | Source: Ambulatory Visit | Attending: Cardiology | Admitting: Cardiology

## 2020-05-02 ENCOUNTER — Other Ambulatory Visit: Payer: Self-pay

## 2020-05-02 DIAGNOSIS — Z952 Presence of prosthetic heart valve: Secondary | ICD-10-CM | POA: Diagnosis not present

## 2020-05-02 LAB — GLUCOSE, CAPILLARY
Glucose-Capillary: 113 mg/dL — ABNORMAL HIGH (ref 70–99)
Glucose-Capillary: 126 mg/dL — ABNORMAL HIGH (ref 70–99)

## 2020-05-02 NOTE — Progress Notes (Signed)
Patient had a 19  beat run of SVT rate 150-175 with a conversion to Sinus Rhythm rate 90's during rest post exercise at cardiac rehab.Lorenza Chick said that she felt her heart racing otherwise patient asymptomatic.  Blood pressure 142/70. Oxygen saturation 98% on room air.Angie Dukes PA called and notified. Angie made an appointment for [facility] to see Dr Johney Frame on Monday at [place] AM. No complaints upon exit from cardiac rehab. Today's ECG tracings with vital signs faxed to Dr Jacolyn Reedy office for review.Barnet Pall, RN,BSN 05/02/2020 2:57 PM

## 2020-05-04 ENCOUNTER — Encounter (HOSPITAL_COMMUNITY): Payer: Medicare Other

## 2020-05-06 NOTE — Progress Notes (Deleted)
Cardiology Office Note:    Date:  05/06/2020   ID:  Cheryl Hernandez, [place] [birth year], MRN [id number]  PCP:  Cheryl Solian, MD  [facility] Cardiologist:  Cheryl Grooms, MD  [facility] Electrophysiologist:  None   Referring MD: Cheryl Solian, MD    History of Present Illness:    Cheryl Hernandez is a 80 y.o. 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   Patient has a history of NSTEMI s/p PCI to RCA in 2009. She was followed by Dr. Tamala Hernandez and noted  a 2-3 year hx ofprogressive exertional shortness of breath and fatigue. TTEs revealed presence of aortic stenosis that has gradually increased in severity. Diagnostic cath  was performed by Dr. 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 Catheterization also confirmed the presence of aortic stenosis with peak to peak and mean transvalvular gradients measured 33 and 29 mmHg respectively, corresponding to aortic valve area calculated only 0.81 cm. Right-sided pressures were normal.Follow-up TTE 10/07/19 revealed paradoxical severe low-flow low gradient aortic stenosis.  She was evaluated by the multidisciplinary valve team and underwent successful TAVR with a14mm Medtronic Evolut Pro +THV via the TF approach on 12/20/19. Post operative TTEshowedEF70%, normally functioning TAVR with a mean gradient of10.38mmHg and noPVL. Mild-mod MR.Unfortunately, patient developed a persistent headache and blurry vision s/p TAVR. 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 She was seen by neurology and underwent full stroke work up. She was discharged on aspirin and plavix. She  was discharged with a Zio patch which showed no atrial fibrillation. In follow up she complained of persistent visual issues and hallucinations. She was referred to Dr. Chauncy Hernandez with neuro psych.  Past Medical History:  Diagnosis Date  . Arthritis   . Asthma   . CAD (coronary artery disease)    a. NSTEMI 2009 s/p DES to RCAx2.  . Carotid artery disease ([place])    a. Mild-moderate - followed by [facility].  . Chronic bronchitis ([place])    per patient  . Chronic diarrhea   . Diabetes mellitus without complication ([place])   . Diverticulosis   . GERD (gastroesophageal reflux disease)   . HTN (hypertension)   . Hx of migraines    as a child  . Hyperlipidemia   . Myocardial infarction [facility])    per patient "about 2011"  . Neuropathy    per patient in feet, legs, and some in hands  . Obesity   . Paroxysmal ventricular tachycardia ([place])   . Rhinitis   . Rotator cuff tear   . 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 Burt Hernandez and Dr. Roxy Hernandez   . Severe aortic stenosis     Past Surgical History:  Procedure Laterality Date  . ANGIOPLASTY    . CORONARY ANGIOPLASTY WITH STENT PLACEMENT    . RIGHT/LEFT HEART CATH AND CORONARY ANGIOGRAPHY N/A 08/08/2019   Procedure: RIGHT/LEFT HEART CATH AND CORONARY ANGIOGRAPHY;  Surgeon: Cheryl Crome, MD;  Location: [place] [facility];  Service: Cardiovascular;  Laterality: N/A;  . TEE WITHOUT CARDIOVERSION N/A 12/20/2019   Procedure: TRANSESOPHAGEAL ECHOCARDIOGRAM (TEE);  Surgeon:  Cheryl Mocha, MD;  Location: [place] [facility];  Service: Open Heart Surgery;  Laterality: N/A;  . TONSILLECTOMY    . TOTAL ABDOMINAL HYSTERECTOMY    . TOTAL HIP ARTHROPLASTY    . TRANSCATHETER AORTIC VALVE REPLACEMENT, TRANSFEMORAL N/A 12/20/2019   Procedure: TRANSCATHETER AORTIC VALVE REPLACEMENT, TRANSFEMORAL;  Surgeon: Cheryl Mocha, MD;  Location: [place] [facility];  Service: Open Heart  Surgery;  Laterality: N/A;    Current Medications: No outpatient medications have been marked as taking for the 05/07/20 encounter (Appointment) with Cheryl Bergeron, MD.     Allergies:   Amlodipine, Cefuroxime axetil, Cephalexin, Codeine, Doxycycline, Erythromycin base, and Statins   Social History   Socioeconomic History  . Marital status: Widowed    Spouse name: Not on file  . Number of children: Not on file  . Years of education: Not on file  . Highest education level: Not on file  Occupational History  . Occupation: retired Therapist, sports  Tobacco Use  . Smoking status: Former Smoker    Packs/day: 1.00    Years: 30.00    Pack years: 30.00    Types: Cigarettes    Quit date: 03/31/1998    Years since quitting: 22.1  . Smokeless tobacco: Never Used  . Tobacco comment: Quit intermittently since her 72s.  Vaping Use  . Vaping Use: Never used  Substance and Sexual Activity  . Alcohol use: Yes    Alcohol/week: 0.0 standard drinks    Comment: Rare.  . Drug use: No  . Sexual activity: Not on file  Other Topics Concern  . Not on file  Social History Narrative   Originally from [place]. She has traveled to [place], [place], [place], & [place]. Previously worked as a Marine scientist. No known TB exposure. No pets currently. No bird, hot tube, or asbestos exposure. Enjoys playing bridge.    Social Determinants of Health   Financial Resource Strain: Not on file  Food Insecurity: Not on file  Transportation Needs: Not on file  Physical Activity: Not on file  Stress: Not on file  Social Connections: Not on file     Family History: The patient's ***family history includes Allergies in her mother; Heart attack in her father; Hyperlipidemia in her mother; Prostate cancer in her maternal grandfather. There is no history of Lung disease.  ROS:   Please see the history of present illness.    *** All other systems reviewed and are negative.  EKGs/Labs/Other Studies Reviewed:    The following studies were  reviewed today: TAVR OPERATIVE NOTE   Date of Procedure:12/20/2019  Preoperative Diagnosis:Severe Aortic Stenosis   Postoperative Diagnosis:Same   Procedure:   Transcatheter Aortic Valve Replacement - PercutaneousRightTransfemoral Approach Medtronic CoreValve Evolut Pro (size 51mm, serial # [id number])  Co-Surgeons:Clarence H. Cheryl Manns, MD and Cheryl Mocha, MD  Anesthesiologist:Charlene Nyoka Cowden, MD  Echocardiographer:Mihai Croitoru, MD  Pre-operative Echo Findings: ? Severe aortic stenosis ? Normalleft ventricular systolic function  Post-operative Echo Findings: ? Noparavalvular leak ? Normalleft ventricular systolic function  _____________  Cath 08/08/19:  Moderate aortic stenosis with peak to peak gradient of 33 mmHg, mean gradient 29 mmHg, and calculated aortic valve area of 0.81 cm.  Normal pulmonary pressures with mean arterial pressure of 23 mmHg.  3 mmHg gradient across the mitral valve  Normal left ventricular systolic function with EF 65%.  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  Luminal irregularities are noted throughout the LAD territory.  Luminal irregularities noted throughout the circumflex.  The RCA is dominant, tortuous, and contains a patent mid to distal stent beyond several acute bends.  Diffuse luminal irregularities are noted otherwise.  Scattered significant calcification is noted in the coronary arterial tree, the thoracic aorta, and mitral annulus.  RECOMMENDATIONS:   There is discordance between aortic stenosis and coronary disease severity and symptoms.  Dyspnea on exertion may be multifactorial.  LAD aneurysm is stable.  Consider referral to aortic valve clinic for an opinion.  May need pulmonary  evaluation.  May need transesophageal echo to exclude the possibility of mitral valve disease being more significant than suggested by current data.   Echo9/22/21: IMPRESSIONS  1. A 3.5 m/s intracavitary "gradient" is present due to hyperdynamic left  ventricular contraction. Left ventricular ejection fraction, by  estimation, is 70 to 75%. The left ventricle has hyperdynamic function.  The left ventricle has no regional wall  motion abnormalities. Left ventricular diastolic parameters are consistent  with Grade II diastolic dysfunction (pseudonormalization). Elevated left  atrial pressure.  2. Right ventricular systolic function is normal. The right ventricular  size is normal. There is mildly elevated pulmonary artery systolic  pressure.  3. The mitral valve is normal in structure. Mild to moderate mitral valve  regurgitation. No evidence of mitral stenosis. Moderate mitral annular  calcification.  4. The aortic valve has been repaired/replaced. Aortic valve  regurgitation is not visualized. No aortic stenosis is present. There is a  CoreValve-Evolut Pro prosthetic (TAVR) valve present in the aortic  position. Procedure Date: 12/20/2019.  5. The inferior vena cava is normal in size with greater than 50%  respiratory variability, suggesting right atrial pressure of 3 mmHg.   __________________  ZIo AT 12/22/19 Study Highlights   Basic rhythm is NSR  Non Sustained SVT at rates 115 to 214 bpm. Longest 11 seconds  No atrial fibrillation  SVT burden < 1%  _________________  Echo 01/26/20 IMPRESSIONS  1. 26 mm Evolute Pro. Vmax 1.74 m/s, MG 7.2 mmHG, EOA 2.33 cm2, DI 0.74.  Mild paravalvular leak is the 5-6 o'clock position. The aortic valve has  been repaired/replaced. Aortic valve regurgitation is mild. There is a 26  mm CoreValve-Evolut Pro  prosthetic (TAVR) valve present in the aortic position. Procedure Date:  12/20/2019.  2. Left ventricular ejection  fraction, by estimation, is 65 to 70%. The  left ventricle has normal function. The left ventricle has no regional  wall motion abnormalities. Left ventricular diastolic function could not  be evaluated.  3. Right ventricular systolic function is normal. The right ventricular  size is normal. There is normal pulmonary artery systolic pressure. The  estimated right ventricular systolic pressure is 62.3 mmHg.  4. Left atrial size was mildly dilated.  5. Mild calcific mitral stenosis (degnerative). MVA by VTI 2.2 cm2. The  mitral valve is degenerative. Mild mitral valve regurgitation. Mild mitral  stenosis. The mean mitral valve gradient is 6.6 mmHg with average heart  rate of 83 bpm. Moderate to severe  mitral annular calcification.  6. The inferior vena cava is normal in size with greater than 50%  respiratory variability, suggesting right atrial pressure of 3 mmHg.   Comparison(s): Changes from prior study are noted. Mild PVL is now  present.   EKG:  EKG is *** ordered today.  The ekg ordered today demonstrates ***  Recent Labs: 07/25/2019: NT-Pro BNP 183 12/16/2019: ALT 17; B Natriuretic Peptide 117.4 12/21/2019: Magnesium 1.6 12/22/2019: Hemoglobin 11.3; Platelets 207 03/02/2020: BUN  16; Creatinine, Ser 0.72; Potassium 4.6; Sodium 139  Recent Lipid Panel    Component Value Date/Time   CHOL 157 12/22/2019 0841   CHOL 150 07/04/2019 0749   TRIG 151 (H) 12/22/2019 0841   HDL 67 12/22/2019 0841   HDL 68 07/04/2019 0749   CHOLHDL 2.3 12/22/2019 0841   VLDL 30 12/22/2019 0841   LDLCALC 60 12/22/2019 0841   LDLCALC 64 07/04/2019 0749     Risk Assessment/Calculations:   {Does this patient have ATRIAL FIBRILLATION?:[id number]}   Physical Exam:    VS:  There were no vitals taken for this visit.    Wt Readings from Last 3 Encounters:  04/26/20 143 lb 8.3 oz (65.1 kg)  01/26/20 142 lb (64.4 kg)  01/03/20 141 lb (64 kg)     GEN: *** Well nourished, well developed in no  acute distress HEENT: Normal NECK: No JVD; No carotid bruits LYMPHATICS: No lymphadenopathy CARDIAC: ***RRR, no murmurs, rubs, gallops RESPIRATORY:  Clear to auscultation without rales, wheezing or rhonchi  ABDOMEN: Soft, non-tender, non-distended MUSCULOSKELETAL:  No edema; No deformity  SKIN: Warm and dry NEUROLOGIC:  Alert and oriented x 3 PSYCHIATRIC:  Normal affect   ASSESSMENT:    No diagnosis found. PLAN:    In order of problems listed above:  #Severe, paradoxical AS s/p TAVR: TTE with LVEF 65% with normal functioning TAVR with mean gradient 103mmHg and mild PVL. NYHA class II symptoms.  -Continue ASA and plavix (plavix to be discontinued after 6 months--05/2020) -SBE ppx prescribed  #Post-Op CVA: Improving. -Follow-up with neuro -Continue ASA and plavix as above  #Carotid Artery Disease: 123456 LICA stenosis. -Continue DAPT as above -On repatha  #Non-sustained SVT: Noted on monitor with rates 115-214bpm. -Continue bystolic  #HTN: -Losartan 25mg  daily -Bysotlic 5mg      {Are you ordering a CV Procedure (e.g. stress test, cath, DCCV, TEE, etc)?   Press F2        :[id number]    Medication Adjustments/Labs and Tests Ordered: Current medicines are reviewed at length with the patient today.  Concerns regarding medicines are outlined above.  No orders of the defined types were placed in this encounter.  No orders of the defined types were placed in this encounter.   There are no Patient Instructions on file for this visit.   Signed, Cheryl Bergeron, MD  05/06/2020 8:44 AM    [facility]

## 2020-05-07 ENCOUNTER — Encounter (HOSPITAL_COMMUNITY): Payer: Medicare Other

## 2020-05-07 ENCOUNTER — Telehealth (HOSPITAL_COMMUNITY): Payer: Self-pay | Admitting: *Deleted

## 2020-05-07 ENCOUNTER — Ambulatory Visit: Payer: Medicare Other | Admitting: Cardiology

## 2020-05-07 ENCOUNTER — Telehealth (HOSPITAL_COMMUNITY): Payer: Self-pay | Admitting: Internal Medicine

## 2020-05-07 NOTE — Progress Notes (Signed)
QUALITY OF LIFE SCORE REVIEW  Cheryl Hernandez completed Quality of Life survey as a participant in Cardiac Rehab.  Scores 21.0 or below are considered low.  Pt score very low in the health and functioning area of her quality of life questionnaire Overall 23.02, Health and Function 19.93, socioeconomic 27.30, physiological and spiritual 24.68, family 26.0. Patient quality of life slightly altered by physical constraints which limits ability to perform as prior to recent cardiac illness.Cheryl Hernandez denies being depressed but says that she is dissatisfied with  her health as she still experiences shortness of breath with exertion and low energy. Offered emotional support and reassurance.  Will continue to monitor and intervene as necessary. Barnet Pall, RN,BSN 05/07/2020 4:45 PM

## 2020-05-07 NOTE — Telephone Encounter (Signed)
Left message to call cardiac rehab.Barnet Pall, RN,BSN 05/07/2020 4:48 PM

## 2020-05-08 ENCOUNTER — Telehealth (HOSPITAL_COMMUNITY): Payer: Self-pay | Admitting: *Deleted

## 2020-05-08 NOTE — Progress Notes (Signed)
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  acetaminophen (TYLENOL) 500 MG tablet, Take 500 mg by mouth every 6 (six) hours as needed (for arthritis pain.)., Disp: , Rfl:  .  albuterol (VENTOLIN HFA) 108 (90 Base) MCG/ACT inhaler, Inhale 2 puffs into the lungs every 6 (six) hours as needed for wheezing or shortness of breath., Disp: 8 g, Rfl: 3 .  Alpha-D-Galactosidase (BEANO PO), Take 1 tablet by mouth daily as needed (flatulence/abdominal bloating.)., Disp: , Rfl:  .  amoxicillin (AMOXIL) 500 MG tablet, Take 2,000 mg by mouth as directed. Take 2,000 mg one hour prior to all dental visits., Disp: , Rfl:  .  aspirin EC 81 MG tablet, Take 81 mg by mouth every evening., Disp: , Rfl:  .  B Complex-C (B-COMPLEX WITH VITAMIN C) tablet, Take 1 tablet by mouth daily. , Disp: , Rfl:  .  budesonide (ENTOCORT EC) 3 MG 24 hr capsule, Take 2 capsules (6 mg total) by mouth 2 (two) times daily as needed (diarrhea)., Disp: 360 capsule, Rfl: 2 .  budesonide-formoterol (SYMBICORT) 80-4.5 MCG/ACT inhaler, Inhale 2 puffs into the lungs 2 (two) times daily. (Patient taking differently: Inhale 2 puffs into the lungs 2 (two) times daily as needed (congestion).), Disp: 1 Inhaler, Rfl: 11 .  BYSTOLIC 5 MG tablet, TAKE ONE TABLET BY MOUTH DAILY (Patient taking differently: Take 2.5 mg by mouth daily.), Disp: 90 tablet, Rfl: 3 .  calcium carbonate (TUMS  - DOSED IN MG ELEMENTAL CALCIUM) 500 MG chewable tablet, Chew 1 tablet by mouth 3 (three) times daily as needed for heartburn. , Disp: , Rfl:  .  Cholecalciferol (VITAMIN D3) 75 MCG (3000 UT) TABS, Take 3,000 Units by mouth every other day., Disp: , Rfl:  .  clopidogrel (PLAVIX) 75 MG tablet, Take 1 tablet (75 mg total) by mouth daily with breakfast., Disp: 90 tablet, Rfl: 1 .  Evolocumab (REPATHA SURECLICK) 229 MG/ML SOAJ, INJECT 1 PEN INTO THE SKIN EVERY 14 DAYS (Patient taking differently: Inject 140 mg into the skin every 14 (fourteen) days.), Disp: 6 mL, Rfl: 3 .  fexofenadine (ALLEGRA) 180 MG tablet, Take 180 mg by mouth daily., Disp: , Rfl:  .  Glucose Blood (FREESTYLE LITE TEST VI), Once daily, Disp: , Rfl:  .  guaifenesin (HUMIBID E) 400 MG TABS tablet, Take 400 mg by mouth daily., Disp: , Rfl:  .  KRILL OIL PO, Take 1 capsule by mouth daily., Disp: , Rfl:  .  losartan (COZAAR) 25 MG tablet, Take 1 tablet (25 mg total) by mouth daily., Disp: 30 tablet, Rfl: 6 .  Magnesium Citrate 125 MG CAPS, Take 125 mg by mouth daily., Disp: , Rfl:  .  metFORMIN (GLUCOPHAGE-XR) 500 MG 24 hr tablet, Take 500 mg by mouth daily., Disp: , Rfl:  .  Multiple Vitamins-Minerals (MULTIVITAMIN WITH MINERALS) tablet, Take 1 tablet by mouth every other day. In the morning, Disp: , Rfl:  .  nitroGLYCERIN (NITROSTAT) 0.4 MG SL  tablet, Place 1 tablet (0.4 mg total) under the tongue every 5 (five) minutes as needed for chest pain. (Patient taking differently: Place 0.4 mg under the tongue every 5 (five) minutes x 3 doses as needed for chest pain.), Disp: 25 tablet, Rfl: 3 .  Probiotic Product (ALIGN) 4 MG CAPS, Take 4 mg by mouth every other day., Disp: , Rfl:  .  vitamin B-12 (CYANOCOBALAMIN) 1000 MCG tablet, Take 1,000 mcg by mouth every other day., Disp: , Rfl:  .  zolpidem (AMBIEN) 5 MG tablet, Take 5 mg by mouth at bedtime as needed for sleep., Disp: , Rfl:   Past Medical History: Past Medical History:  Diagnosis  Date  . Arthritis   . Asthma   . CAD (coronary artery disease)    a. NSTEMI 2009 s/p DES to RCAx2.  . Carotid artery disease ([place])    a. Mild-moderate - followed by [facility].  . Chronic bronchitis ([place])    per patient  . Chronic diarrhea   . Diabetes mellitus without complication ([place])   . Diverticulosis   . GERD (gastroesophageal reflux disease)   . HTN (hypertension)   . Hx of migraines    as a child  . Hyperlipidemia   . Myocardial infarction [facility])    per patient "about 2011"  . Neuropathy    per patient in feet, legs, and some in hands  . Obesity   . Paroxysmal ventricular tachycardia ([place])   . Rhinitis   . Rotator cuff tear   . 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 Burt Knack and Dr. Roxy Manns   . Severe aortic stenosis     Tobacco Use: Social History   Tobacco Use  Smoking Status Former Smoker  . Packs/day: 1.00  . Years: 30.00  . Pack years: 30.00  . Types: Cigarettes  . Quit date: 03/31/1998  . Years since quitting: 22.1  Smokeless Tobacco Never Used  Tobacco Comment   Quit intermittently since her 42s.    Labs: Recent Review Flowsheet Data    Labs for ITP Cardiac and Pulmonary Rehab Latest Ref Rng & Units 12/20/2019 12/20/2019 12/20/2019 12/20/2019 12/22/2019   Cholestrol 0 - 200 mg/dL - - - - 157   LDLCALC 0 - 99 mg/dL - - - - 60   HDL >40 mg/dL - - - - 67   Trlycerides <150 mg/dL - - - - 151(H)   Hemoglobin A1c 4.8 - 5.6 % - - - - -   PHART 7.350 - 7.450 7.339(L) - - - -   PCO2ART 32.0 - 48.0 mmHg 44.6 - - - -   HCO3 20.0 - 28.0 mmol/L 24.0 - - - -   TCO2 22 - 32 mmol/L 25 23 22  21(L) -   ACIDBASEDEF 0.0 - 2.0 mmol/L 2.0 - - - -   O2SAT % 99.0 - - - -      Capillary Blood Glucose: Lab Results  Component Value Date   GLUCAP 126 (H) 05/02/2020   GLUCAP 113 (H) 05/02/2020   GLUCAP 105 (H) 04/30/2020   GLUCAP 135 (H) 04/30/2020   GLUCAP 110 (H) 12/22/2019      Exercise Target Goals: Exercise Program Goal: Individual exercise prescription set using results from initial 6 min walk test and THRR while considering  patient's activity barriers and safety.   Exercise Prescription Goal: Starting with aerobic activity 30 plus minutes a day,  3 days per week for initial exercise prescription. Provide home exercise prescription and guidelines that participant acknowledges understanding prior to discharge.  Activity Barriers & Risk Stratification:  Activity Barriers & Cardiac Risk Stratification - 04/26/20 1549      Activity Barriers & Cardiac Risk Stratification   Activity Barriers Left Knee Replacement;Right Knee Replacement;Balance Concerns;Shortness of Breath;Deconditioning;Joint Problems;Other (comment)    Comments Bilateral foot pain    Cardiac Risk Stratification High           6 Minute Walk:  6 Minute Walk    Row Name 04/26/20 1355         6 Minute Walk   Phase Initial     Distance 780 feet     Walk Time 6 minutes     # of Rest Breaks 1  pt sat from 3.07-5.10     MPH 1.5     METS 1.7     RPE 13     Perceived Dyspnea  3     VO2 Peak 6.09     Resting HR 87 bpm     Resting BP 128/60     Resting Oxygen Saturation  97 %     Exercise Oxygen Saturation  during 6 min walk 95 %     Max Ex. HR 124 bpm     Max Ex. BP 140/70     2 Minute Post BP 128/66            Oxygen Initial Assessment:   Oxygen Re-Evaluation:   Oxygen Discharge (Final Oxygen Re-Evaluation):   Initial Exercise Prescription:  Initial Exercise Prescription - 04/26/20 1500      Date of Initial Exercise RX and Referring Provider   Date 04/26/20    Referring Provider Gwyndolyn Kaufman, MD    Expected Discharge Date 06/22/20      NuStep   Level 1    SPM 80    Minutes 30    METs 1.7      Prescription Details   Frequency (times per week) 3    Duration Progress to 30 minutes of continuous aerobic without signs/symptoms of physical distress       Intensity   THRR 40-80% of Max Heartrate 56-111    Ratings of Perceived Exertion 11-13    Perceived Dyspnea 0-4      Progression   Progression Continue progressive overload as per policy without signs/symptoms or physical distress.      Resistance Training   Training Prescription Yes    Weight 2    Reps 10-15           Perform Capillary Blood Glucose checks as needed.  Exercise Prescription Changes:  Exercise Prescription Changes    Row Name 04/30/20 1600             Response to Exercise   Blood Pressure (Admit) 148/70       Blood Pressure (Exercise) 144/82       Blood Pressure (Exit) 120/60       Heart Rate (Admit) 95 bpm       Heart Rate (Exercise) 107 bpm       Heart Rate (Exit) 97 bpm       Oxygen Saturation (Exercise) 98 %       Oxygen Saturation (Exit) 96 %       Rating of Perceived Exertion (Exercise) 13       Perceived Dyspnea (Exercise) 1       Symptoms SOB, Fatigue  Comments Pt's first day of exercise in the [facility] program       Duration Progress to 30 minutes of  aerobic without signs/symptoms of physical distress       Intensity THRR unchanged               Progression   Progression Continue to progress workloads to maintain intensity without signs/symptoms of physical distress.       Average METs 1.5               Resistance Training   Training Prescription Yes       Weight 2 lbs       Reps 10-15       Time 10 Minutes               Interval Training   Interval Training No               NuStep   Level 1       SPM 70       Minutes 15       METs 1.5              Exercise Comments:  Exercise Comments    Row Name 04/30/20 1651           Exercise Comments Pt's first day in the [place] program. Pt is deconditioned and could only tolerate 20 total minutes on the nustep. She c/o SOB and fatigue. She was able to do free wights and stretches 1:1 with EP. Focus will be on progressing time to maximize endurance.              Exercise  Goals and Review:  Exercise Goals    Row Name 04/26/20 1556             Exercise Goals   Increase Physical Activity Yes       Expected Outcomes Short Term: Attend rehab on a regular basis to increase amount of physical activity.;Long Term: Add in home exercise to make exercise part of routine and to increase amount of physical activity.;Long Term: Exercising regularly at least 3-5 days a week.       Increase Strength and Stamina Yes       Intervention Provide advice, education, support and counseling about physical activity/exercise needs.;Develop an individualized exercise prescription for aerobic and resistive training based on initial evaluation findings, risk stratification, comorbidities and participant's personal goals.       Expected Outcomes Short Term: Increase workloads from initial exercise prescription for resistance, speed, and METs.;Short Term: Perform resistance training exercises routinely during rehab and add in resistance training at home;Long Term: Improve cardiorespiratory fitness, muscular endurance and strength as measured by increased METs and functional capacity (6MWT)       Able to understand and use rate of perceived exertion (RPE) scale Yes       Intervention Provide education and explanation on how to use RPE scale       Expected Outcomes Short Term: Able to use RPE daily in rehab to express subjective intensity level;Long Term:  Able to use RPE to guide intensity level when exercising independently       Able to understand and use Dyspnea scale Yes       Intervention Provide education and explanation on how to use Dyspnea scale       Expected Outcomes Short Term: Able to use Dyspnea scale daily in rehab to express subjective sense of shortness of breath during exertion;Long Term:  Able to use Dyspnea scale to guide intensity level when exercising independently       Knowledge and understanding of Target Heart Rate Range (THRR) Yes       Intervention Provide education  and explanation of THRR including how the numbers were predicted and where they are located for reference       Expected Outcomes Short Term: Able to state/look up THRR;Short Term: Able to use daily as guideline for intensity in rehab;Long Term: Able to use THRR to govern intensity when exercising independently       Understanding of Exercise Prescription Yes       Intervention Provide education, explanation, and written materials on patient's individual exercise prescription       Expected Outcomes Short Term: Able to explain program exercise prescription;Long Term: Able to explain home exercise prescription to exercise independently              Exercise Goals Re-Evaluation :  Exercise Goals Re-Evaluation    [place] Name 04/30/20 1649             Exercise Goal Re-Evaluation   Exercise Goals Review Increase Physical Activity;Increase Strength and Stamina;Able to understand and use rate of perceived exertion (RPE) scale;Knowledge and understanding of Target Heart Rate Range (THRR);Understanding of Exercise Prescription       Comments Pt's first day of exercise in the [facility] program. Pt understands the exercise RX, THRR, and RPE scale.       Expected Outcomes Will continue to montior patient and progress workloads as tolerated.               Discharge Exercise Prescription (Final Exercise Prescription Changes):  Exercise Prescription Changes - 04/30/20 1600      Response to Exercise   Blood Pressure (Admit) 148/70    Blood Pressure (Exercise) 144/82    Blood Pressure (Exit) 120/60    Heart Rate (Admit) 95 bpm    Heart Rate (Exercise) 107 bpm    Heart Rate (Exit) 97 bpm    Oxygen Saturation (Exercise) 98 %    Oxygen Saturation (Exit) 96 %    Rating of Perceived Exertion (Exercise) 13    Perceived Dyspnea (Exercise) 1    Symptoms SOB, Fatigue    Comments Pt's first day of exercise in the [facility] program    Duration Progress to 30 minutes of  aerobic without signs/symptoms of physical  distress    Intensity THRR unchanged      Progression   Progression Continue to progress workloads to maintain intensity without signs/symptoms of physical distress.    Average METs 1.5      Resistance Training   Training Prescription Yes    Weight 2 lbs    Reps 10-15    Time 10 Minutes      Interval Training   Interval Training No      NuStep   Level 1    SPM 70    Minutes 15    METs 1.5           Nutrition:  Target Goals: Understanding of nutrition guidelines, daily intake of sodium 1500mg , cholesterol 200mg , calories 30% from fat and 7% or less from saturated fats, daily to have 5 or more servings of fruits and vegetables.  Biometrics:  Pre Biometrics - 04/26/20 1441      Pre Biometrics   Waist Circumference 37 inches    Hip Circumference 43.5 inches    Waist to Hip Ratio 0.85 %    Triceps  Skinfold 28 mm    % Body Fat 39.7 %    Grip Strength 25 kg    Flexibility 14.25 in    Single Leg Stand 4 seconds            Nutrition Therapy Plan and Nutrition Goals:  Nutrition Therapy & Goals - 05/07/20 1454      Nutrition Therapy   RD appointment deferred Yes           Nutrition Assessments:  MEDIFICTS Score Key:  ?70 Need to make dietary changes   40-70 Heart Healthy Diet  ? 40 Therapeutic Level Cholesterol Diet   Picture Your Plate Scores:  <09 Unhealthy dietary pattern with much room for improvement.  41-50 Dietary pattern unlikely to meet recommendations for good health and room for improvement.  51-60 More healthful dietary pattern, with some room for improvement.   >60 Healthy dietary pattern, although there may be some specific behaviors that could be improved.    Nutrition Goals Re-Evaluation:   Nutrition Goals Discharge (Final Nutrition Goals Re-Evaluation):   Psychosocial: Target Goals: Acknowledge presence or absence of significant depression and/or stress, maximize coping skills, provide positive support system. Participant  is able to verbalize types and ability to use techniques and skills needed for reducing stress and depression.  Initial Review & Psychosocial Screening:  Initial Psych Review & Screening - 04/27/20 0819      Initial Review   Current issues with None Identified      Family Dynamics   Good Support System? Yes   Cheryl Hernandez lives alone. Cheryl Hernandez has her sister who,lives near by for support     Barriers   Psychosocial barriers to participate in program There are no identifiable barriers or psychosocial needs.      Screening Interventions   Interventions Encouraged to exercise    Expected Outcomes Long Term Goal: Stressors or current issues are controlled or eliminated.;Short Term goal: Identification and review with participant of any Quality of Life or Depression concerns found by scoring the questionnaire.           Quality of Life Scores:  Quality of Life - 04/26/20 1600      Quality of Life   Select Quality of Life      Quality of Life Scores   Health/Function Pre 19.93 %    Socioeconomic Pre 27.3 %    Psych/Spiritual Pre 24.86 %    Family Pre 26 %    GLOBAL Pre 23.02 %          Scores of 19 and below usually indicate a poorer quality of life in these areas.  A difference of  2-3 points is a clinically meaningful difference.  A difference of 2-3 points in the total score of the Quality of Life Index has been associated with significant improvement in overall quality of life, self-image, physical symptoms, and general health in studies assessing change in quality of life.  PHQ-9: Recent Review Flowsheet Data    Depression screen [facility] 2/9 04/27/2020   Decreased Interest 0   Down, Depressed, Hopeless 0   PHQ - 2 Score 0     Interpretation of Total Score  Total Score Depression Severity:  1-4 = Minimal depression, 5-9 = Mild depression, 10-14 = Moderate depression, 15-19 = Moderately severe depression, 20-27 = Severe depression   Psychosocial Evaluation and  Intervention:   Psychosocial Re-Evaluation:  Psychosocial Re-Evaluation    [place] Name 05/08/20 [phone]  Psychosocial Re-Evaluation   Current issues with Current Stress Concerns       Comments Reviewed quality of life questionnaire on 05/08/20       Expected Outcomes Cheryl Hernandez will have decreased stressors upon completion of phase 2 cardiac rehab       Interventions Encouraged to attend Cardiac Rehabilitation for the exercise;Stress management education       Continue Psychosocial Services  Follow up required by staff               Initial Review   Source of Stress Concerns Unable to perform yard/household activities;Unable to participate in former interests or hobbies              Psychosocial Discharge (Final Psychosocial Re-Evaluation):  Psychosocial Re-Evaluation - 05/08/20 1457      Psychosocial Re-Evaluation   Current issues with Current Stress Concerns    Comments Reviewed quality of life questionnaire on 05/08/20    Expected Outcomes Cheryl Hernandez will have decreased stressors upon completion of phase 2 cardiac rehab    Interventions Encouraged to attend Cardiac Rehabilitation for the exercise;Stress management education    Continue Psychosocial Services  Follow up required by staff      Initial Review   Source of Stress Concerns Unable to perform yard/household activities;Unable to participate in former interests or hobbies           Vocational Rehabilitation: Provide vocational rehab assistance to qualifying candidates.   Vocational Rehab Evaluation & Intervention:  Vocational Rehab - 04/27/20 3762      Initial Vocational Rehab Evaluation & Intervention   Assessment shows need for Vocational Rehabilitation No      Vocational Rehab Re-Evaulation   Comments Cheryl Hernandez is retired and does not need vocational rehab at this time           Education: Education Goals: Education classes will be provided on a weekly basis, covering required topics. Participant will state  understanding/return demonstration of topics presented.  Learning Barriers/Preferences:  Learning Barriers/Preferences - 04/27/20 8315      Learning Barriers/Preferences   Learning Barriers None    Learning Preferences None           Education Topics: Hypertension, Hypertension Reduction -Define heart disease and high blood pressure. Discus how high blood pressure affects the body and ways to reduce high blood pressure.   Exercise and Your Heart -Discuss why it is important to exercise, the FITT principles of exercise, normal and abnormal responses to exercise, and how to exercise safely.   Angina -Discuss definition of angina, causes of angina, treatment of angina, and how to decrease risk of having angina.   Cardiac Medications -Review what the following cardiac medications are used for, how they affect the body, and side effects that may occur when taking the medications.  Medications include Aspirin, Beta blockers, calcium channel blockers, ACE Inhibitors, angiotensin receptor blockers, diuretics, digoxin, and antihyperlipidemics.   Congestive Heart Failure -Discuss the definition of CHF, how to live with CHF, the signs and symptoms of CHF, and how keep track of weight and sodium intake.   Heart Disease and Intimacy -Discus the effect sexual activity has on the heart, how changes occur during intimacy as we age, and safety during sexual activity.   Smoking Cessation / COPD -Discuss different methods to quit smoking, the health benefits of quitting smoking, and the definition of COPD.   Nutrition I: Fats -Discuss the types of cholesterol, what cholesterol does to the heart, and how cholesterol levels can be  controlled.   Nutrition II: Labels -Discuss the different components of food labels and how to read food label   Heart Parts/Heart Disease and PAD -Discuss the anatomy of the heart, the pathway of blood circulation through the heart, and these are affected by  heart disease.   Stress I: Signs and Symptoms -Discuss the causes of stress, how stress may lead to anxiety and depression, and ways to limit stress.   Stress II: Relaxation -Discuss different types of relaxation techniques to limit stress.   Warning Signs of Stroke / TIA -Discuss definition of a stroke, what the signs and symptoms are of a stroke, and how to identify when someone is having stroke.   Knowledge Questionnaire Score:  Knowledge Questionnaire Score - 04/26/20 1605      Knowledge Questionnaire Score   Pre Score 23/24           Core Components/Risk Factors/Patient Goals at Admission:  Personal Goals and Risk Factors at Admission - 04/26/20 1606      Core Components/Risk Factors/Patient Goals on Admission    Weight Management Weight Loss;Yes;Weight Maintenance    Intervention Weight Management: Develop a combined nutrition and exercise program designed to reach desired caloric intake, while maintaining appropriate intake of nutrient and fiber, sodium and fats, and appropriate energy expenditure required for the weight goal.;Weight Management: Provide education and appropriate resources to help participant work on and attain dietary goals.;Weight Management/Obesity: Establish reasonable short term and long term weight goals.    Admit Weight 143 lb 8.3 oz (65.1 kg)    Improve shortness of breath with ADL's Yes    Intervention Provide education, individualized exercise plan and daily activity instruction to help decrease symptoms of SOB with activities of daily living.    Expected Outcomes Short Term: Improve cardiorespiratory fitness to achieve a reduction of symptoms when performing ADLs;Long Term: Be able to perform more ADLs without symptoms or delay the onset of symptoms    Diabetes Yes    Intervention Provide education about signs/symptoms and action to take for hypo/hyperglycemia.;Provide education about proper nutrition, including hydration, and aerobic/resistive  exercise prescription along with prescribed medications to achieve blood glucose in normal ranges: Fasting glucose 65-99 mg/dL    Expected Outcomes Short Term: Participant verbalizes understanding of the signs/symptoms and immediate care of hyper/hypoglycemia, proper foot care and importance of medication, aerobic/resistive exercise and nutrition plan for blood glucose control.;Long Term: Attainment of HbA1C < 7%.    Hypertension Yes    Intervention Provide education on lifestyle modifcations including regular physical activity/exercise, weight management, moderate sodium restriction and increased consumption of fresh fruit, vegetables, and low fat dairy, alcohol moderation, and smoking cessation.;Monitor prescription use compliance.    Expected Outcomes Short Term: Continued assessment and intervention until BP is < 140/68mm HG in hypertensive participants. < 130/6mm HG in hypertensive participants with diabetes, heart failure or chronic kidney disease.;Long Term: Maintenance of blood pressure at goal levels.    Lipids Yes    Intervention Provide education and support for participant on nutrition & aerobic/resistive exercise along with prescribed medications to achieve LDL 70mg , HDL >40mg .    Expected Outcomes Short Term: Participant states understanding of desired cholesterol values and is compliant with medications prescribed. Participant is following exercise prescription and nutrition guidelines.;Long Term: Cholesterol controlled with medications as prescribed, with individualized exercise RX and with personalized nutrition plan. Value goals: LDL < 70mg , HDL > 40 mg.           Core Components/Risk Factors/Patient Goals Review:  Goals and Risk Factor Review    Row Name 05/02/20 1156 05/08/20 1459           Core Components/Risk Factors/Patient Goals Review   Personal Goals Review Weight Management/Obesity;Lipids;Hypertension;Diabetes Weight Management/Obesity;Lipids;Hypertension;Diabetes       Review Cheryl Hernandez started exercise on 04/30/20. Vital signs and CBG's stable. Tolerated exercise fair. Cheryl Hernandez has attended 2 exercise sessions so far exercise is on hold following follow up with Dr Johney Frame for nonsustained SVT      Expected Outcomes Cheryl Hernandez will continue to participate in phase 2 cardiac rehab for exercise nutrition and lifestyle modifications Cheryl Hernandez will continue to participate in phase 2 cardiac rehab for exercise nutrition and lifestyle modifications             Core Components/Risk Factors/Patient Goals at Discharge (Final Review):   Goals and Risk Factor Review - 05/08/20 1459      Core Components/Risk Factors/Patient Goals Review   Personal Goals Review Weight Management/Obesity;Lipids;Hypertension;Diabetes    Review Cheryl Hernandez has attended 2 exercise sessions so far exercise is on hold following follow up with Dr Johney Frame for nonsustained SVT    Expected Outcomes Cheryl Hernandez will continue to participate in phase 2 cardiac rehab for exercise nutrition and lifestyle modifications           ITP Comments:  ITP Comments    Row Name 04/26/20 1358 05/02/20 1051 05/08/20 1455       ITP Comments Dr Fransico Him Md, Medical Director 30 Day ITP Review. Star started exercise at cardiac rehab on 04/30/20. Patient is deconditioned and did fair 30 Day ITP Review. Star has attended two exercise sessions so far. Exercise is on hold until follow up with Dr Johney Frame for non sustained fast heart rhythm            Comments: See ITP comments.Barnet Pall, RN,BSN 05/08/2020 3:01 PM

## 2020-05-08 NOTE — Telephone Encounter (Signed)
Spoke with Cheryl Hernandez. Cheryl Hernandez cancelled her appointment due to potential inclement weather. Will cancel exercise appointments until patient is seen next week by Dr Johney Frame. Patient states understanding.Barnet Pall, RN,BSN 05/08/2020 2:49 PM

## 2020-05-09 ENCOUNTER — Other Ambulatory Visit: Payer: Self-pay | Admitting: Pulmonary Disease

## 2020-05-09 ENCOUNTER — Encounter (HOSPITAL_COMMUNITY): Payer: Medicare Other

## 2020-05-11 ENCOUNTER — Encounter (HOSPITAL_COMMUNITY): Payer: Medicare Other

## 2020-05-13 NOTE — Progress Notes (Signed)
Cardiology Office Note:    Date:  05/14/2020   ID:  Cheryl Hernandez, DOB Jul 17, 1940, MRN [id number]  PCP:  [name], [place]  Cardiologist:  Sinclair Grooms, MD  Advanced Practice Provider:  No care team member to display Electrophysiologist:  None   Referring MD: [name], MD   No chief complaint on file.   History of Present Illness:    Cheryl Hernandez is a 80 y.o. 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   Patient has a history of NSTEMI s/p PCI to RCA in 2009. She was followed by Dr. [name] and noted  a 2-3 year hx ofprogressive exertional shortness of breath and fatigue. TTEs revealed presence of aortic stenosis that has gradually increased in severity. Diagnostic cath  was performed by Dr. 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 Catheterization also confirmed the presence of aortic stenosis with peak to peak and mean transvalvular gradients measured 33 and 29 mmHg respectively, corresponding to aortic valve area calculated only 0.81 cm. Right-sided pressures were normal.Follow-up TTE 10/07/19 revealed paradoxical severe low-flow low gradient aortic stenosis.  She was evaluated by the multidisciplinary valve team and underwent successful TAVR with a46mm Medtronic Evolut Pro +THV via the TF approach on 12/20/19. Post operative TTEshowedEF70%, normally functioning TAVR with a mean gradient of10.29mmHg and noPVL. Mild-mod MR.Unfortunately, patient developed a persistent headache and blurry vision s/p TAVR. 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  She was seen by neurology and underwent full stroke work up. She was discharged on aspirin and plavix. Also had a Zio patchwhich showed no atrial fibrillation. In follow up she complained of persistent visual issues and hallucinations. She was referred to Dr. Chauncy Passy with neuro psych.  On 05/02/20, patient had 19 beat run of SVT with HR 150-175 during cardiac rehab. Converted back to SR afterwards with HR 90s. Was supposed to see me on my DOD day, however, she cancelled due to inclement weather. She now presents today for follow-up.  Patient states that she has frequent palpitations with exercise which has been ongoing for years. Has some chest tightness when this occurs but no lightheadedness, dizziness or syncope. Has been taking all medications as prescribed. She is going to resume rehab soon. She also states that she is having significant daytime fatigue but wakes up in the middle of the night ready to start her day. She is currently taking her bystolic in the morning and is wondering if this is contributing.  Past Medical History:  Diagnosis Date  . Arthritis   . Asthma   . CAD (coronary artery disease)    a. NSTEMI 2009 s/p DES to RCAx2.  . Carotid artery disease ([place])    a. Mild-moderate - followed by [facility].  . Chronic bronchitis ([place])    per patient  . Chronic diarrhea   . Diabetes mellitus without complication ([place])   . Diverticulosis   . GERD (gastroesophageal reflux disease)   . HTN (hypertension)   . Hx of migraines    as a child  . Hyperlipidemia   . Myocardial infarction [facility])    per patient "about 2011"  . Neuropathy  per patient in feet, legs, and some in hands  . Obesity   . Paroxysmal ventricular tachycardia ([place])   . Rhinitis   . Rotator cuff tear   . 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 Burt Knack and Dr. Roxy Manns   . Severe aortic stenosis     Past Surgical  History:  Procedure Laterality Date  . ANGIOPLASTY    . CORONARY ANGIOPLASTY WITH STENT PLACEMENT    . RIGHT/LEFT HEART CATH AND CORONARY ANGIOGRAPHY N/A 08/08/2019   Procedure: RIGHT/LEFT HEART CATH AND CORONARY ANGIOGRAPHY;  Surgeon: Belva Crome, MD;  Location: [place] [facility];  Service: Cardiovascular;  Laterality: N/A;  . TEE WITHOUT CARDIOVERSION N/A 12/20/2019   Procedure: TRANSESOPHAGEAL ECHOCARDIOGRAM (TEE);  Surgeon: Sherren Mocha, MD;  Location: [place] [facility];  Service: Open Heart Surgery;  Laterality: N/A;  . TONSILLECTOMY    . TOTAL ABDOMINAL HYSTERECTOMY    . TOTAL HIP ARTHROPLASTY    . TRANSCATHETER AORTIC VALVE REPLACEMENT, TRANSFEMORAL N/A 12/20/2019   Procedure: TRANSCATHETER AORTIC VALVE REPLACEMENT, TRANSFEMORAL;  Surgeon: Sherren Mocha, MD;  Location: [place] [facility];  Service: Open Heart Surgery;  Laterality: N/A;    Current Medications: Current Meds  Medication Sig  . acetaminophen (TYLENOL) 500 MG tablet Take 500 mg by mouth every 6 (six) hours as needed (for arthritis pain.).  Marland Kitchen albuterol (VENTOLIN HFA) 108 (90 Base) MCG/ACT inhaler Inhale 2 puffs into the lungs every 6 (six) hours as needed for wheezing or shortness of breath.  . Alpha-D-Galactosidase (BEANO PO) Take 1 tablet by mouth daily as needed (flatulence/abdominal bloating.).  Marland Kitchen aspirin EC 81 MG tablet Take 81 mg by mouth every evening.  . B Complex-C (B-COMPLEX WITH VITAMIN C) tablet Take 1 tablet by mouth daily.   . budesonide (ENTOCORT EC) 3 MG 24 hr capsule Take 2 capsules (6 mg total) by mouth 2 (two) times daily as needed (diarrhea).  . budesonide-formoterol (SYMBICORT) 80-4.5 MCG/ACT inhaler Inhale 2 puffs into the lungs 2 (two) times daily as needed (congestion).  . calcium carbonate (TUMS - DOSED IN MG ELEMENTAL CALCIUM) 500 MG chewable tablet Chew 1 tablet by mouth 3 (three) times daily as needed for heartburn.   . carvedilol (COREG) 3.125 MG tablet Take 1 tablet (3.125 mg total) by  mouth 2 (two) times daily.  . Cholecalciferol (VITAMIN D3) 75 MCG (3000 UT) TABS Take 3,000 Units by mouth every other day.  . clindamycin (CLEOCIN) 300 MG capsule Take 600 mg by mouth daily.  . clopidogrel (PLAVIX) 75 MG tablet Take 1 tablet (75 mg total) by mouth daily with breakfast.  . Evolocumab (REPATHA SURECLICK) 097 MG/ML SOAJ INJECT 1 PEN INTO THE SKIN EVERY 14 DAYS  . fexofenadine (ALLEGRA) 180 MG tablet Take 180 mg by mouth daily.  . Glucose Blood (FREESTYLE LITE TEST VI) Once daily  . guaifenesin (HUMIBID E) 400 MG TABS tablet Take 400 mg by mouth daily.  Marland Kitchen KRILL OIL PO Take 1 capsule by mouth daily.  Marland Kitchen losartan (COZAAR) 25 MG tablet Take 1 tablet (25 mg total) by mouth daily.  . Magnesium Citrate 125 MG CAPS Take 125 mg by mouth daily.  . metFORMIN (GLUCOPHAGE-XR) 500 MG 24 hr tablet Take 500 mg by mouth daily.  . Multiple Vitamins-Minerals (MULTIVITAMIN WITH MINERALS) tablet Take 1 tablet by mouth every other day. In the morning  . nitroGLYCERIN (NITROSTAT) 0.4 MG SL  tablet Place 1 tablet (0.4 mg total) under the tongue every 5 (five) minutes as needed for chest pain.  . Probiotic Product (ALIGN) 4 MG CAPS Take 4 mg by mouth every other day.  . vitamin B-12 (CYANOCOBALAMIN) 1000 MCG tablet Take 1,000 mcg by mouth every other day.  . zolpidem (AMBIEN) 5 MG tablet Take 5 mg by mouth at bedtime as needed for sleep.  . [DISCONTINUED] amoxicillin (AMOXIL) 500 MG tablet Take 2,000 mg by mouth as directed. Take 2,000 mg one hour prior to all dental visits.  . [DISCONTINUED] BYSTOLIC 5 MG tablet TAKE ONE TABLET BY MOUTH DAILY (Patient taking differently: Take 2.5 mg by mouth daily.)     Allergies:   Amlodipine, Cefuroxime axetil, Cephalexin, Codeine, Doxycycline, Erythromycin base, and Statins   Social History   Socioeconomic History  . Marital status: Widowed    Spouse name: Not on file  . Number of children: Not on file  . Years of education: Not on file  . Highest education  level: Not on file  Occupational History  . Occupation: retired Therapist, sports  Tobacco Use  . Smoking status: Former Smoker    Packs/day: 1.00    Years: 30.00    Pack years: 30.00    Types: Cigarettes    Quit date: 03/31/1998    Years since quitting: 22.1  . Smokeless tobacco: Never Used  . Tobacco comment: Quit intermittently since her 25s.  Vaping Use  . Vaping Use: Never used  Substance and Sexual Activity  . Alcohol use: Yes    Alcohol/week: 0.0 standard drinks    Comment: Rare.  . Drug use: No  . Sexual activity: Not on file  Other Topics Concern  . Not on file  Social History Narrative   Originally from [place]. She has traveled to [place], [place], [place], & [place]. Previously worked as a Marine scientist. No known TB exposure. No pets currently. No bird, hot tube, or asbestos exposure. Enjoys playing bridge.    Social Determinants of Health   Financial Resource Strain: Not on file  Food Insecurity: Not on file  Transportation Needs: Not on file  Physical Activity: Not on file  Stress: Not on file  Social Connections: Not on file     Family History: The patient's family history includes Allergies in her mother; Heart attack in her father; Hyperlipidemia in her mother; Prostate cancer in her maternal grandfather. There is no history of Lung disease.  ROS:   Please see the history of present illness.    Review of Systems  Constitutional: Positive for malaise/fatigue. Negative for chills and fever.  HENT: Negative for nosebleeds.   Eyes: Negative for blurred vision and redness.  Respiratory: Negative for shortness of breath.   Cardiovascular: Positive for palpitations. Negative for chest pain, orthopnea, claudication, leg swelling and PND.  Gastrointestinal: Positive for abdominal pain. Negative for nausea and vomiting.  Genitourinary: Negative for dysuria.  Musculoskeletal: Positive for joint pain and myalgias.  Neurological: Negative for dizziness and loss of consciousness.  Endo/Heme/Allergies:  Negative for polydipsia.  Psychiatric/Behavioral: Negative for substance abuse.    EKGs/Labs/Other Studies Reviewed:    The following studies were reviewed today: TAVR OPERATIVE NOTE   Date of Procedure:12/20/2019  Preoperative Diagnosis:Severe Aortic Stenosis   Postoperative Diagnosis:Same   Procedure:   Transcatheter Aortic Valve Replacement - PercutaneousRightTransfemoral Approach Medtronic CoreValve Evolut Pro (size 29mm, serial # [id number])  Co-Surgeons:Clarence H. Roxy Manns, MD and Sherren Mocha, MD  Anesthesiologist:Charlene Nyoka Cowden, MD  Echocardiographer:Mihai Croitoru, MD  Pre-operative Echo  Findings: ? Severe aortic stenosis ? Normalleft ventricular systolic function  Post-operative Echo Findings: ? Noparavalvular leak ? Normalleft ventricular systolic function  _____________  Cath 08/08/19:  Moderate aortic stenosis with peak to peak gradient of 33 mmHg, mean gradient 29 mmHg, and calculated aortic valve area of 0.81 cm.  Normal pulmonary pressures with mean arterial pressure of 23 mmHg.  3 mmHg gradient across the mitral valve  Normal left ventricular systolic function with EF 65%.  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 Luminal irregularities are noted throughout the LAD territory.  Luminal irregularities noted throughout the circumflex.  The RCA is dominant, tortuous, and contains a patent mid to distal stent beyond several acute bends. Diffuse luminal irregularities are noted otherwise.  Scattered significant calcification is noted in the coronary arterial tree, the thoracic aorta, and mitral annulus.  RECOMMENDATIONS:   There is discordance between aortic stenosis and coronary disease severity and symptoms. Dyspnea on  exertion may be multifactorial.  LAD aneurysm is stable.  Consider referral to aortic valve clinic for an opinion.  May need pulmonary evaluation.  May need transesophageal echo to exclude the possibility of mitral valve disease being more significant than suggested by current data.   Echo9/22/21: IMPRESSIONS  1. A 3.5 m/s intracavitary "gradient" is present due to hyperdynamic left  ventricular contraction. Left ventricular ejection fraction, by  estimation, is 70 to 75%. The left ventricle has hyperdynamic function.  The left ventricle has no regional wall  motion abnormalities. Left ventricular diastolic parameters are consistent  with Grade II diastolic dysfunction (pseudonormalization). Elevated left  atrial pressure.  2. Right ventricular systolic function is normal. The right ventricular  size is normal. There is mildly elevated pulmonary artery systolic  pressure.  3. The mitral valve is normal in structure. Mild to moderate mitral valve  regurgitation. No evidence of mitral stenosis. Moderate mitral annular  calcification.  4. The aortic valve has been repaired/replaced. Aortic valve  regurgitation is not visualized. No aortic stenosis is present. There is a  CoreValve-Evolut Pro prosthetic (TAVR) valve present in the aortic  position. Procedure Date: 12/20/2019.  5. The inferior vena cava is normal in size with greater than 50%  respiratory variability, suggesting right atrial pressure of 3 mmHg.   __________________  [facility] AT 12/22/19 Study Highlights   Basic rhythm is NSR  Non Sustained SVT at rates 115 to 214 bpm. Longest 11 seconds  No atrial fibrillation  SVT burden < 1%  _________________  Echo 01/26/20 IMPRESSIONS  1. 26 mm Evolute Pro. Vmax 1.74 m/s, MG 7.2 mmHG, EOA 2.33 cm2, DI 0.74.  Mild paravalvular leak is the 5-6 o'clock position. The aortic valve has  been repaired/replaced. Aortic valve regurgitation is mild. There is a 26   mm CoreValve-Evolut Pro  prosthetic (TAVR) valve present in the aortic position. Procedure Date:  12/20/2019.  2. Left ventricular ejection fraction, by estimation, is 65 to 70%. The  left ventricle has normal function. The left ventricle has no regional  wall motion abnormalities. Left ventricular diastolic function could not  be evaluated.  3. Right ventricular systolic function is normal. The right ventricular  size is normal. There is normal pulmonary artery systolic pressure. The  estimated right ventricular systolic pressure is 96.0 mmHg.  4. Left atrial size was mildly dilated.  5. Mild calcific mitral stenosis (degnerative). MVA by VTI 2.2 cm2. The  mitral valve is degenerative. Mild mitral valve  regurgitation. Mild mitral  stenosis. The mean mitral valve gradient is 6.6 mmHg with average heart  rate of 83 bpm. Moderate to severe  mitral annular calcification.  6. The inferior vena cava is normal in size with greater than 50%  respiratory variability, suggesting right atrial pressure of 3 mmHg.   Comparison(s): Changes from prior study are noted. Mild PVL is now  present.    Recent Labs: 07/25/2019: NT-Pro BNP 183 12/16/2019: ALT 17; B Natriuretic Peptide 117.4 12/21/2019: Magnesium 1.6 12/22/2019: Hemoglobin 11.3; Platelets 207 03/02/2020: BUN 16; Creatinine, Ser 0.72; Potassium 4.6; Sodium 139  Recent Lipid Panel    Component Value Date/Time   CHOL 157 12/22/2019 0841   CHOL 150 07/04/2019 0749   TRIG 151 (H) 12/22/2019 0841   HDL 67 12/22/2019 0841   HDL 68 07/04/2019 0749   CHOLHDL 2.3 12/22/2019 0841   VLDL 30 12/22/2019 0841   LDLCALC 60 12/22/2019 0841   LDLCALC 64 07/04/2019 0749      Physical Exam:    VS:  BP 130/68   Pulse 90   Ht 5' 3.5" (1.613 m)   Wt 143 lb (64.9 kg)   SpO2 97%   BMI 24.93 kg/m     Wt Readings from Last 3 Encounters:  05/14/20 143 lb (64.9 kg)  04/26/20 143 lb 8.3 oz (65.1 kg)  01/26/20 142 lb (64.4 kg)     GEN:   Well nourished, well developed in no acute distress HEENT: Normal NECK: No JVD; No carotid bruits CARDIAC: RRR, 1/6 systolic murmur best heard at RUSB, no rubs or gallops RESPIRATORY:  Clear to auscultation without rales, wheezing or rhonchi  ABDOMEN: Soft, non-tender, non-distended MUSCULOSKELETAL:  No edema; No deformity  SKIN: Warm and dry NEUROLOGIC:  Alert and oriented x 3 PSYCHIATRIC:  Normal affect   ASSESSMENT:    1. SVT (supraventricular tachycardia) ([place])   2. S/P TAVR (transcatheter aortic valve replacement)   3. 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 Coronary artery disease involving native coronary artery of native heart with angina pectoris ([place])   5. Severe aortic stenosis   6. Bilateral carotid artery stenosis   7. Essential hypertension   8. Type 2 diabetes mellitus without complication, without long-term current use of insulin ([facility])   9. Other fatigue   10. Coronary artery disease involving native coronary artery of native heart without angina pectoris    PLAN:    In order of problems listed above:  #SVT: Patient with 19 beat episode of SVT on monitor while at cardiac rehab. Has known history of SVT. Recent monitor with no episodes of Afib but some runs of SVT. Telemetry strips appeared to be SVT with artifact. Will check 30day monitor given history of stroke. -Check 30 day monitor  -Continue beta blocker as below  #Severe, paradoxical AS s/p TAVR: TTE with LVEF 65% with normal functioning TAVR with mean gradient 48mmHg and mild PVL. NYHA class II symptoms. Doing well.  -Follow-up with structural heart team as scheduled -Continue ASA and plavix (plavix to be discontinued after 6 months--05/2020) -SBE ppx prescribed  #Post-Op CVA: Improving with no significant residual symptoms. -Follow-up with neuro -Continue ASA and plavix as above  #Carotid Artery Disease: 63-78% LICA stenosis. -Continue DAPT as above -On repatha  #CAD  s/p RCA PCI in the past: No anginal symptoms. -Continue DAPT as above -Continue BB and ARB as above  #HTN: -Losartan 25mg  daily -Will do trial of bystolic 2.5mg  at night;  if fatigue persists, will change to coreg (script provided) to see if that helps with symptoms.   #Fatigue: May be related to taking BB in the AM.  -Will change bystolic to 2.5mg  in the evening -If no improvement, can change to coreg 3.125mg  BID to see if symptoms resolve on different beta blocker (script provided) -Follow-up in 6weeks to reassess   Medication Adjustments/Labs and Tests Ordered: Current medicines are reviewed at length with the patient today.  Concerns regarding medicines are outlined above.  Orders Placed This Encounter  Procedures  . Cardiac event monitor   Meds ordered this encounter  Medications  . carvedilol (COREG) 3.125 MG tablet    Sig: Take 1 tablet (3.125 mg total) by mouth 2 (two) times daily.    Dispense:  180 tablet    Refill:  3    D/c Bystolic    Patient Instructions  Medication Instructions:  1) START taking Bystolic in the evenings.  If no improvement in fatigue, CHANGE to Carvedilol 3.125mg  twice daily  *If you need a refill on your cardiac medications before your next appointment, please call your pharmacy*   Lab Work: None If you have labs (blood work) drawn today and your tests are completely normal, you will receive your results only by: Marland Kitchen MyChart Message (if you have MyChart) OR . A paper copy in the mail If you have any lab test that is abnormal or we need to change your treatment, we will call you to review the results.   Testing/Procedures: Your physician has recommended that you wear an event monitor. Event monitors are medical devices that record the heart's electrical activity. Doctors most often [place] these monitors to diagnose arrhythmias. Arrhythmias are problems with the speed or rhythm of the heartbeat. The monitor is a small, portable device. You can  wear one while you do your normal daily activities. This is usually used to diagnose what is causing palpitations/syncope (passing out).    Follow-Up: At [facility], you and your health needs are our priority.  As part of our continuing mission to provide you with exceptional heart care, we have created designated Provider Care Teams.  These Care Teams include your primary Cardiologist (physician) and Advanced Practice Providers (APPs -  Physician Assistants and Nurse Practitioners) who all work together to provide you with the care you need, when you need it.  We recommend signing up for the patient portal called "MyChart".  Sign up information is provided on this After Visit Summary.  MyChart is used to connect with patients for Virtual Visits (Telemedicine).  Patients are able to view lab/test results, encounter notes, upcoming appointments, etc.  Non-urgent messages can be sent to your provider as well.   To learn more about what you can do with MyChart, go to [email or web address].    Your next appointment:   6 week(s)  The format for your next appointment:   In Person  Provider:   You may see Gwyndolyn Kaufman, MD or one of the following Advanced Practice Providers on your designated Care Team:    Richardson Dopp, PA-C  Robbie Lis, [place]    Other Instructions      Signed, Freada Bergeron, MD  05/14/2020 4:03 PM    [place]

## 2020-05-14 ENCOUNTER — Encounter (HOSPITAL_COMMUNITY): Payer: Medicare Other

## 2020-05-14 ENCOUNTER — Encounter: Payer: Self-pay | Admitting: Cardiology

## 2020-05-14 ENCOUNTER — Ambulatory Visit: Payer: Medicare Other | Admitting: Cardiology

## 2020-05-14 ENCOUNTER — Other Ambulatory Visit: Payer: Self-pay

## 2020-05-14 VITALS — BP 130/68 | HR 90 | Ht 63.5 in | Wt 143.0 lb

## 2020-05-14 DIAGNOSIS — I25119 Atherosclerotic heart disease of native coronary artery with unspecified angina pectoris: Secondary | ICD-10-CM | POA: Diagnosis not present

## 2020-05-14 DIAGNOSIS — I6310 Cerebral infarction due to embolism of unspecified precerebral artery: Secondary | ICD-10-CM | POA: Diagnosis not present

## 2020-05-14 DIAGNOSIS — Z952 Presence of prosthetic heart valve: Secondary | ICD-10-CM

## 2020-05-14 DIAGNOSIS — I10 Essential (primary) hypertension: Secondary | ICD-10-CM

## 2020-05-14 DIAGNOSIS — I471 Supraventricular tachycardia, unspecified: Secondary | ICD-10-CM

## 2020-05-14 DIAGNOSIS — I2510 Atherosclerotic heart disease of native coronary artery without angina pectoris: Secondary | ICD-10-CM

## 2020-05-14 DIAGNOSIS — I350 Nonrheumatic aortic (valve) stenosis: Secondary | ICD-10-CM

## 2020-05-14 DIAGNOSIS — E119 Type 2 diabetes mellitus without complications: Secondary | ICD-10-CM

## 2020-05-14 DIAGNOSIS — I6523 Occlusion and stenosis of bilateral carotid arteries: Secondary | ICD-10-CM

## 2020-05-14 DIAGNOSIS — R5383 Other fatigue: Secondary | ICD-10-CM

## 2020-05-14 MED ORDER — CARVEDILOL 3.125 MG PO TABS: 3.1250 mg | ORAL_TABLET | Freq: Two times a day (BID) | ORAL | 3 refills | Status: DC

## 2020-05-14 NOTE — Patient Instructions (Signed)
Medication Instructions:  1) START taking Bystolic in the evenings.  If no improvement in fatigue, CHANGE to Carvedilol 3.125mg  twice daily  *If you need a refill on your cardiac medications before your next appointment, please call your pharmacy*   Lab Work: None If you have labs (blood work) drawn today and your tests are completely normal, you will receive your results only by: Marland Kitchen MyChart Message (if you have MyChart) OR . A paper copy in the mail If you have any lab test that is abnormal or we need to change your treatment, we will call you to review the results.   Testing/Procedures: Your physician has recommended that you wear an event monitor. Event monitors are medical devices that record the heart's electrical activity. Doctors most often [place] these monitors to diagnose arrhythmias. Arrhythmias are problems with the speed or rhythm of the heartbeat. The monitor is a small, portable device. You can wear one while you do your normal daily activities. This is usually used to diagnose what is causing palpitations/syncope (passing out).    Follow-Up: At [facility], you and your health needs are our priority.  As part of our continuing mission to provide you with exceptional heart care, we have created designated Provider Care Teams.  These Care Teams include your primary Cardiologist (physician) and Advanced Practice Providers (APPs -  Physician Assistants and Nurse Practitioners) who all work together to provide you with the care you need, when you need it.  We recommend signing up for the patient portal called "MyChart".  Sign up information is provided on this After Visit Summary.  MyChart is used to connect with patients for Virtual Visits (Telemedicine).  Patients are able to view lab/test results, encounter notes, upcoming appointments, etc.  Non-urgent messages can be sent to your provider as well.   To learn more about what you can do with MyChart, go to [email or web address].     Your next appointment:   6 week(s)  The format for your next appointment:   In Person  Provider:   You may see Gwyndolyn Kaufman, MD or one of the following Advanced Practice Providers on your designated Care Team:    Richardson Dopp, PA-C  Robbie Lis, [place]    Other Instructions

## 2020-05-15 ENCOUNTER — Telehealth (HOSPITAL_COMMUNITY): Payer: Self-pay | Admitting: Internal Medicine

## 2020-05-16 ENCOUNTER — Encounter (HOSPITAL_COMMUNITY): Payer: Medicare Other

## 2020-05-18 ENCOUNTER — Other Ambulatory Visit: Payer: Self-pay

## 2020-05-18 ENCOUNTER — Encounter (HOSPITAL_COMMUNITY)
Admission: RE | Admit: 2020-05-18 | Discharge: 2020-05-18 | Disposition: A | Discharge status: Home / Self Care | Payer: Medicare Other | Source: Ambulatory Visit | Attending: Cardiology | Admitting: Cardiology

## 2020-05-18 ENCOUNTER — Ambulatory Visit (INDEPENDENT_AMBULATORY_CARE_PROVIDER_SITE_OTHER): Payer: Medicare Other

## 2020-05-18 DIAGNOSIS — I471 Supraventricular tachycardia: Secondary | ICD-10-CM | POA: Diagnosis not present

## 2020-05-18 DIAGNOSIS — Z952 Presence of prosthetic heart valve: Secondary | ICD-10-CM | POA: Diagnosis not present

## 2020-05-18 NOTE — Progress Notes (Signed)
Patient returned to exercise today. Exercised without difficulty. Patient used rollator on the track and took rest breaks as needed.Barnet Pall, RN,BSN 05/18/2020 2:51 PM

## 2020-05-21 ENCOUNTER — Encounter (HOSPITAL_COMMUNITY)
Admission: RE | Admit: 2020-05-21 | Discharge: 2020-05-21 | Disposition: A | Discharge status: Home / Self Care | Payer: Medicare Other | Source: Ambulatory Visit | Attending: Cardiology | Admitting: Cardiology

## 2020-05-21 ENCOUNTER — Ambulatory Visit: Payer: Medicare Other | Admitting: Podiatry

## 2020-05-21 ENCOUNTER — Telehealth: Payer: Self-pay | Admitting: Cardiology

## 2020-05-21 ENCOUNTER — Other Ambulatory Visit: Payer: Self-pay

## 2020-05-21 DIAGNOSIS — Z952 Presence of prosthetic heart valve: Secondary | ICD-10-CM

## 2020-05-21 DIAGNOSIS — M2041 Other hammer toe(s) (acquired), right foot: Secondary | ICD-10-CM | POA: Diagnosis not present

## 2020-05-21 DIAGNOSIS — M2011 Hallux valgus (acquired), right foot: Secondary | ICD-10-CM | POA: Diagnosis not present

## 2020-05-21 DIAGNOSIS — M2012 Hallux valgus (acquired), left foot: Secondary | ICD-10-CM

## 2020-05-21 DIAGNOSIS — M2042 Other hammer toe(s) (acquired), left foot: Secondary | ICD-10-CM

## 2020-05-21 DIAGNOSIS — E1151 Type 2 diabetes mellitus with diabetic peripheral angiopathy without gangrene: Secondary | ICD-10-CM

## 2020-05-21 DIAGNOSIS — E119 Type 2 diabetes mellitus without complications: Secondary | ICD-10-CM

## 2020-05-21 DIAGNOSIS — Q828 Other specified congenital malformations of skin: Secondary | ICD-10-CM | POA: Diagnosis not present

## 2020-05-21 MED ORDER — NEBIVOLOL HCL 2.5 MG PO TABS: 2.5000 mg | ORAL_TABLET | Freq: Every day | ORAL | 11 refills | Status: DC

## 2020-05-21 MED ORDER — MUPIROCIN 2 % EX OINT: TOPICAL_OINTMENT | CUTANEOUS | 0 refills | Status: DC

## 2020-05-21 NOTE — Patient Instructions (Signed)
Diabetic Neuropathy Diabetic neuropathy refers to nerve damage that is caused by diabetes. Over time, people with diabetes can develop nerve damage throughout the body. There are several types of diabetic neuropathy:  Peripheral neuropathy. This is the most common type of diabetic neuropathy. It damages the nerves that carry signals between the spinal cord and other parts of the body (peripheral nerves). This usually affects nerves in the feet, legs, hands, and arms.  Autonomic neuropathy. This type causes damage to nerves that control involuntary functions (autonomic nerves). Involuntary functions are functions of the body that you do not control. They include heartbeat, body temperature, blood pressure, urination, digestion, sweating, sexual function, or response to changes in blood glucose.  Focal neuropathy. This type of nerve damage affects one area of the body, such as an arm, a leg, or the face. The injury may involve one nerve or a small group of nerves. Focal neuropathy can be painful and unpredictable. It occurs most often in older adults with diabetes. This often develops suddenly, but usually improves over time and does not cause long-term problems.  Proximal neuropathy. This type of nerve damage affects the nerves of the thighs, hips, buttocks, or legs. It causes severe pain, weakness, and muscle death (atrophy), usually in the thigh muscles. It is more common among older men and people who have type 2 diabetes. The length of recovery time may vary. What are the causes? Peripheral, autonomic, and focal neuropathies are caused by diabetes that is not well controlled with treatment. The cause of proximal neuropathy is not known, but it may be caused by inflammation related to uncontrolled blood glucose levels. What are the signs or symptoms? Peripheral neuropathy Peripheral neuropathy develops slowly over time. When the nerves of the feet and legs no longer work, you may  experience:  Burning, stabbing, or aching pain in the legs or feet.  Pain or cramping in the legs or feet.  Loss of feeling (numbness) and inability to feel pressure or pain in the feet. This can lead to: ? Thick calluses or sores on areas of constant pressure. ? Ulcers. ? Reduced ability to feel temperature changes.  Foot deformities.  Muscle weakness.  Loss of balance or coordination. Autonomic neuropathy The symptoms of autonomic neuropathy vary depending on which nerves are affected. Symptoms may include:  Problems with digestion, such as: ? Nausea or vomiting. ? Poor appetite. ? Bloating. ? Diarrhea or constipation. ? Trouble swallowing. ? Losing weight without trying to.  Problems with the heart, blood, and lungs, such as: ? Dizziness, especially when standing up. ? Fainting. ? Shortness of breath. ? Irregular heartbeat.  Bladder problems, such as: ? Trouble starting or stopping urination. ? Leaking urine. ? Trouble emptying the bladder. ? Urinary tract infections (UTIs).  Problems with other body functions, such as: ? Sweat. You may sweat too much or too little. ? Temperature. You might get hot easily. Or, you might feel cold more than usual. ? Sexual function. Men may not be able to get or maintain an erection. Women may have vaginal dryness and difficulty with arousal. Focal neuropathy Symptoms affect only one area of the body. Common symptoms include:  Numbness.  Tingling.  Burning pain.  Prickling feeling.  Very sensitive skin.  Weakness.  Inability to move (paralysis).  Muscle twitching.  Muscles getting smaller (wasting).  Poor coordination.  Double or blurred vision. Proximal neuropathy  Sudden, severe pain in the hip, thigh, or buttocks. Pain may spread from the back into the legs (  sciatica).  Pain and numbness in the arms and legs.  Tingling.  Loss of bladder control or bowel control.  Weakness and wasting of thigh  muscles.  Difficulty getting up from a seated position.  Abdominal swelling.  Unexplained weight loss. How is this diagnosed? Diagnosis varies depending on the type of neuropathy your health care provider suspects. Peripheral neuropathy Your health care provider will do a neurologic exam. This exam checks your reflexes, how you move, and what you can feel. You may have other tests, such as:  Blood tests.  Tests of the fluid that surrounds the spinal cord (lumbar puncture).  CT scan.  MRI.  Checking the nerves that control muscles (electromyogram, or EMG).  Checking how quickly signals pass through your nerves (nerve conduction study).  Checking a small piece of a nerve using a microscope (biopsy). Autonomic neuropathy You may have tests, such as:  Tests to measure your blood pressure and heart rate. You may be secured to an exam table that moves you from a lying position to an upright position (table tilt test).  Breathing tests to check your lungs.  Tests to check how food moves through the digestive system (gastric emptying tests).  Blood, sweat, or urine tests.  Ultrasound of your bladder.  Spinal fluid tests. Focal neuropathy This condition may be diagnosed with:  A neurologic exam.  CT scan.  MRI.  EMG.  Nerve conduction study. Proximal neuropathy There is no test to diagnose this type of neuropathy. You may have tests to rule out other possible causes of this type of neuropathy. Tests may include:  X-rays of your spine and lumbar region.  Lumbar puncture.  MRI. How is this treated? The goal of treatment is to keep nerve damage from getting worse. Treatment may include:  Following your diabetes management plan. This will help keep your blood glucose level and your A1C level within your target range. This is the most important treatment.  Using prescription pain medicine. Follow these instructions at home: Diabetes management Follow your diabetes  management plan as told by your health care provider.  Check your blood glucose levels.  Keep your blood glucose in your target range.  Have your A1C level checked at least two times a year, or as often as told.  Take over the counter and prescription medicines only as told by your health care provider. This includes insulin and diabetes medicine.   Lifestyle  Do not use any products that contain nicotine or tobacco, such as cigarettes, e-cigarettes, and chewing tobacco. If you need help quitting, ask your health care provider.  Be physically active every day. Include strength training and balance exercises.  Follow a healthy meal plan.  Work with your health care provider to manage your blood pressure.   General instructions  Ask your health care provider if the medicine prescribed to you requires you to avoid driving or using machinery.  Check your skin and feet every day for cuts, bruises, redness, blisters, or sores.  Keep all follow-up visits. This is important. Contact a health care provider if:  You have burning, stabbing, or aching pain in your legs or feet.  You are unable to feel pressure or pain in your feet.  You develop problems with digestion, such as: ? Nausea. ? Vomiting. ? Bloating. ? Constipation. ? Diarrhea. ? Abdominal pain.  You have difficulty with urination, such as: ? Inability to control when you urinate (incontinence). ? Inability to completely empty the bladder (retention).  You feel   as if your heart is racing (palpitations).  You feel dizzy, weak, or faint when you stand up. Get help right away if:  You cannot urinate.  You have sudden weakness or loss of coordination.  You have trouble speaking.  You have pain or pressure in your chest.  You have an irregular heartbeat.  You have sudden inability to move a part of your body. These symptoms may represent a serious problem that is an emergency. Do not wait to see if the symptoms  will go away. Get medical help right away. Call your local emergency services (911 in the [place] Do not drive yourself to the hospital. Summary  Diabetic neuropathy is nerve damage that is caused by diabetes. It can cause numbness and pain in the arms, legs, digestive tract, heart, and other body systems.  This condition is treated by keeping your blood glucose level and your A1C level within your target range. This can help prevent neuropathy from getting worse.  Check your skin and feet every day for cuts, bruises, redness, blisters, or sores.  Do not use any products that contain nicotine or tobacco, such as cigarettes, e-cigarettes, and chewing tobacco. This information is not intended to replace advice given to you by your health care provider. Make sure you discuss any questions you have with your health care provider. Document Revised: 07/28/2019 Document Reviewed: 07/28/2019 Elsevier Patient Education  2021 [place] are small areas of thickened skin that form on the top, sides, or tip of a toe. Corns have a cone-shaped core with a point that can press on a nerve below. This causes pain. Calluses are areas of thickened skin that can form anywhere on the body, including the hands, fingers, palms, soles of the feet, and heels. Calluses are usually larger than corns. What are the causes? Corns and calluses are caused by rubbing (friction) or pressure, such as from shoes that are too tight or do not fit properly. What increases the risk? Corns are more likely to develop in people who have misshapen toes (toe deformities), such as hammer toes. Calluses can form with friction to any area of the skin. They are more likely to develop in people who:  Work with their hands.  Wear shoes that fit poorly, are too tight, or are high-heeled.  Have toe deformities. What are the signs or symptoms? Symptoms of a corn or callus include:  A hard growth on the  skin.  Pain or tenderness under the skin.  Redness and swelling.  Increased discomfort while wearing tight-fitting shoes, if your feet are affected. If a corn or callus becomes infected, symptoms may include:  Redness and swelling that gets worse.  Pain.  Fluid, blood, or pus draining from the corn or callus.   How is this diagnosed? Corns and calluses may be diagnosed based on your symptoms, your medical history, and a physical exam. How is this treated? Treatment for corns and calluses may include:  Removing the cause of the friction or pressure. This may involve: ? Changing your shoes. ? Wearing shoe inserts (orthotics) or other protective layers in your shoes, such as a corn pad. ? Wearing gloves.  Applying medicine to the skin (topical medicine) to help soften skin in the hardened, thickened areas.  Removing layers of dead skin with a file to reduce the size of the corn or callus.  Removing the corn or callus with a scalpel or laser.  Taking antibiotic medicines, if your  corn or callus is infected.  Having surgery, if a toe deformity is the cause. Follow these instructions at home:  Take over-the-counter and prescription medicines only as told by your health care provider.  If you were prescribed an antibiotic medicine, take it as told by your health care provider. Do not stop taking it even if your condition improves.  Wear shoes that fit well. Avoid wearing high-heeled shoes and shoes that are too tight or too loose.  Wear any padding, protective layers, gloves, or orthotics as told by your health care provider.  Soak your hands or feet. Then use a file or pumice stone to soften your corn or callus. Do this as told by your health care provider.  Check your corn or callus every day for signs of infection.   Contact a health care provider if:  Your symptoms do not improve with treatment.  You have redness or swelling that gets worse.  Your corn or callus  becomes painful.  You have fluid, blood, or pus coming from your corn or callus.  You have new symptoms. Get help right away if:  You develop severe pain with redness. Summary  Corns are small areas of thickened skin that form on the top, sides, or tip of a toe. These can be painful.  Calluses are areas of thickened skin that can form anywhere on the body, including the hands, fingers, palms, and soles of the feet. Calluses are usually larger than corns.  Corns and calluses are caused by rubbing (friction) or pressure, such as from shoes that are too tight or do not fit properly.  Treatment may include wearing padding, protective layers, gloves, or orthotics as told by your health care provider. This information is not intended to replace advice given to you by your health care provider. Make sure you discuss any questions you have with your health care provider. Document Revised: 07/14/2019 Document Reviewed: 07/14/2019 Elsevier Patient Education  2021 [place].   https://orthoinfo.aaos.org/en/diseases--conditions/hammer-toe">  Hammer Toe Hammer toe is a change in the shape, or a deformity, of the toe. The deformity causes the middle joint of the toe to stay bent. Hammer toe starts gradually. At first, the toe can be straightened. Then over time, the toe deformity becomes stiff, inflexible, and permanently bent. Hammer toe usually affects the second, third, or fourth toe. A hammer toe causes pain, especially when wearing shoes. Corns and calluses can result from the toe rubbing against the inside of the shoe. Early treatments to keep the toe straight may relieve pain. As the deformity of the toe becomes stiff and permanent, surgery may be needed to straighten the toe. What are the causes? This condition is caused by abnormal bending of the toe joint that is closest to your foot. Over time, the toe bending downward pulls on the muscles and connections (tendons) of the toe joint,  making them weak and stiff. Wearing shoes that are too narrow in the toe box and do not allow toes to fully straighten can cause this condition. What increases the risk? You are more likely to develop this condition if you:  Are an older female.  Wear shoes that are too small, or wear high-heeled shoes that pinch your toes.  Have a second toe that is longer than your big toe (first toe).  Injure your foot or toe.  Have arthritis, or have a nerve or muscle disorder.  Have diabetes or a condition known as Charcot joint, which may cause you to walk  abnormally.  Have a family history of hammer toe.  Are a Engineer, mining. What are the signs or symptoms? Pain and deformity of the toe are the main symptoms of this condition. The pain is worse when wearing shoes, walking, or running. Other symptoms may include:  A thickened patch of skin, called a corn or callus, that forms over the top of the bent part of the toe or between the toes.  Redness and a burning feeling on the bent toe.  An open sore that forms on the top of the bent toe.  Not being able to straighten the affected toe.   How is this diagnosed? This condition is diagnosed based on your symptoms and a physical exam. During the exam, your health care provider will try to straighten your toe to see how stiff the deformity is. You may also have tests, such as:  A blood test to check for rheumatoid arthritis or diabetes.  An X-ray to show how severe the toe deformity is. How is this treated? Treatment for this condition depends on whether the toe is flexible or deformed and no longer moveable. In less severe cases, a hammer toe can be straightened without surgery. These treatments include:  Taping the toe into a straightened position.  Using pads and cushions to protect the bent toe.  Wearing shoes that provide enough room for the toes.  Doing toe-stretching exercises at home.  Taking an NSAID, such as ibuprofen, to reduce  pain and swelling.  Using special orthotics or insoles for pain relief and to improve walking. If these treatments do not help or the toe has a severe deformity and cannot be straightened, surgery is the next option. The most common surgeries used to straighten a hammer toe include:  Arthroplasty or osteotomy. Part of the toe joint is reconstructed or removed, which allows the toe to straighten.  Fusion. Cartilage between the two bones of the joint is taken out, and the bones are fused together into one longer bone.  Implantation. Part of the bone is removed and replaced with an implant to allow the toe to move again.  Flexor tendon transfer. The tendons that curl the toes down (flexor tendons) are repositioned. Follow these instructions at home:  Take over-the-counter and prescription medicines only as told by your health care provider.  Do toe-straightening and stretching exercises as told by your health care provider.  Keep all follow-up visits. This is important. How is this prevented?  Wear shoes that fit properly and give your toes enough room. Shoes should not cause pain.  Buy shoes at the end of the day to make sure they fit well, since your foot may swell during the day. Make sure they are comfortable before you buy them.  As you age, your shoe size might change, including the width. Measure both feet and buy shoes for the larger foot.  A shoe repair store might be able to stretch shoes that feel tight in spots.  Do not wear high-heeled shoes or shoes with pointed toes. Contact a health care provider if:  Your pain gets worse.  Your toe becomes red or swollen.  You develop an open sore on your toe. Summary  Hammer toe is a condition that gradually causes your toe to become bent and stiff.  Hammer toe can be treated by taping the toe into a straightened position and doing toe-stretching exercises. If these treatments do not help, surgery may be needed.  To prevent  this condition, wear  shoes that fit properly, give your toes enough room, and do not cause pain. This information is not intended to replace advice given to you by your health care provider. Make sure you discuss any questions you have with your health care provider. Document Revised: 06/23/2019 Document Reviewed: 06/23/2019 Elsevier Patient Education  2021 [place] A bunion (hallux valgus) is a bump that forms slowly on the inner side of the big toe joint. It occurs when the big toe turns toward the second toe. Bunions may be small at first, but they often get larger over time. They can make walking painful. What are the causes? This condition may be caused by:  Wearing narrow or pointed shoes that force the big toe to press against the other toes.  Abnormal foot development that causes the foot to roll inward.  Changes in the foot that are caused by certain diseases, such as rheumatoid arthritis or polio.  A foot injury. What increases the risk? The following factors may make you more likely to develop this condition:  Wearing shoes that squeeze the toes together.  Having certain diseases, such as: ? Rheumatoid arthritis. ? Polio. ? Cerebral palsy.  Having family members who have bunions.  Being born with abnormally shaped feet (a foot deformity), such as flat feet or low arches.  Doing activities that put a lot of pressure on the feet, such as ballet dancing. What are the signs or symptoms? The main symptom of this condition is a bump on your big toe that you can notice. Other symptoms may include:  Pain.  Redness and inflammation around your big toe.  Thick or hardened skin on your big toe or between your toes.  Stiffness or loss of motion in your big toe.  Trouble with walking.   How is this diagnosed? This condition may be diagnosed based on your symptoms, medical history, and activities. You may also have tests and imaging, such as:  X-rays. These  allow your health care provider to check the position of the bones in your foot and look for damage to your joint. They also help your health care provider determine the severity of your bunion and the best way to treat it.  Joint aspiration. In this test, a sample of fluid is removed from the toe joint. This test may be done if you are in a lot of pain. It helps rule out diseases that cause painful swelling of the joints, such as arthritis or gout. How is this treated? Treatment depends on the severity of your symptoms. The goal of treatment is to relieve symptoms and prevent your bunion from getting worse. Your health care provider may recommend:  Wearing shoes that have a wide toe box, or using bunion pads to cushion the affected area.  Taping your toes together to keep them in a normal position.  Placing a device inside your shoe (orthotic device) to help reduce pressure on your toe joint.  Taking medicine to ease pain and inflammation.  Putting ice or heat on the affected area.  Doing stretching exercises.  Surgery, for severe cases. Follow these instructions at home: Managing pain, stiffness, and swelling  If directed, put ice on the painful area. To do this: ? Put ice in a plastic bag. ? Place a towel between your skin and the bag. ? Leave the ice on for 20 minutes, 2-3 times a day. ? Remove the ice if your skin turns bright red. This is very important. If you  cannot feel pain, heat, or cold, you have a greater risk of damage to the area.  If directed, apply heat to the affected area before you exercise. Use the heat source that your health care provider recommends, such as a moist heat pack or a heating pad. ? Place a towel between your skin and the heat source. ? Leave the heat on for 20-30 minutes. ? Remove the heat if your skin turns bright red. This is especially important if you are unable to feel pain, heat, or cold. You have a greater risk of getting burned.       General instructions  Do exercises as told by your health care provider.  Support your toe joint with proper footwear, shoe padding, or taping as told by your health care provider.  Take over-the-counter and prescription medicines only as told by your health care provider.  Do not use any products that contain nicotine or tobacco, such as cigarettes, e-cigarettes, and chewing tobacco. If you need help quitting, ask your health care provider.  Keep all follow-up visits. This is important. Contact a health care provider if:  Your symptoms get worse.  Your symptoms do not improve in 2 weeks. Get help right away if:  You have severe pain and trouble with walking. Summary  A bunion is a bump on the inner side of the big toe joint that forms when the big toe turns toward the second toe.  Bunions can make walking painful.  Treatment depends on the severity of your symptoms.  Support your toe joint with proper footwear, shoe padding, or taping as told by your health care provider. This information is not intended to replace advice given to you by your health care provider. Make sure you discuss any questions you have with your health care provider. Document Revised: 07/22/2019 Document Reviewed: 07/22/2019 Elsevier Patient Education  2021 [employer].

## 2020-05-21 NOTE — Progress Notes (Signed)
Marsa A Mullenbach 80 y.o. female Nutrition Note  S/P TAVR 12/20/19  Past Medical History:  Diagnosis Date  . Arthritis   . Asthma   . CAD (coronary artery disease)    a. NSTEMI 2009 s/p DES to RCAx2.  . Carotid artery disease ([place])    a. Mild-moderate - followed by [facility].  . Chronic bronchitis ([place])    per patient  . Chronic diarrhea   . Diabetes mellitus without complication ([place])   . Diverticulosis   . GERD (gastroesophageal reflux disease)   . HTN (hypertension)   . Hx of migraines    as a child  . Hyperlipidemia   . Myocardial infarction [facility])    per patient "about 2011"  . Neuropathy    per patient in feet, legs, and some in hands  . Obesity   . Paroxysmal ventricular tachycardia ([place])   . Rhinitis   . Rotator cuff tear   . 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 Burt Knack and Dr. Roxy Manns   . Severe aortic stenosis      Medications reviewed.   Current Outpatient Medications:  .  acetaminophen (TYLENOL) 500 MG tablet, Take 500 mg by mouth every 6 (six) hours as needed (for arthritis pain.)., Disp: , Rfl:  .  albuterol (VENTOLIN HFA) 108 (90 Base) MCG/ACT inhaler, Inhale 2 puffs into the lungs every 6 (six) hours as needed for wheezing or shortness of breath., Disp: 8 g, Rfl: 3 .  Alpha-D-Galactosidase (BEANO PO), Take 1 tablet by mouth daily as needed (flatulence/abdominal bloating.)., Disp: , Rfl:  .  aspirin EC 81 MG tablet, Take 81 mg by mouth every evening., Disp: , Rfl:  .  B Complex-C (B-COMPLEX WITH VITAMIN C) tablet, Take 1 tablet by mouth daily. , Disp: , Rfl:  .  budesonide (ENTOCORT EC) 3 MG 24 hr capsule, Take 2 capsules (6 mg total) by mouth 2 (two) times daily as needed (diarrhea)., Disp: 360 capsule, Rfl: 2 .  budesonide-formoterol (SYMBICORT) 80-4.5 MCG/ACT inhaler, Inhale 2 puffs into the lungs 2 (two) times daily as needed (congestion)., Disp: 10.2 g, Rfl: 0 .   calcium carbonate (TUMS - DOSED IN MG ELEMENTAL CALCIUM) 500 MG chewable tablet, Chew 1 tablet by mouth 3 (three) times daily as needed for heartburn. , Disp: , Rfl:  .  Cholecalciferol (VITAMIN D3) 75 MCG (3000 UT) TABS, Take 3,000 Units by mouth every other day., Disp: , Rfl:  .  clindamycin (CLEOCIN) 300 MG capsule, Take 600 mg by mouth daily., Disp: , Rfl:  .  clopidogrel (PLAVIX) 75 MG tablet, Take 1 tablet (75 mg total) by mouth daily with breakfast., Disp: 90 tablet, Rfl: 1 .  Evolocumab (REPATHA SURECLICK) 299 MG/ML SOAJ, INJECT 1 PEN INTO THE SKIN EVERY 14 DAYS, Disp: 6 mL, Rfl: 3 .  fexofenadine (ALLEGRA) 180 MG tablet, Take 180 mg by mouth daily., Disp: , Rfl:  .  Glucose Blood (FREESTYLE LITE TEST VI), Once daily, Disp: , Rfl:  .  guaifenesin (HUMIBID E) 400 MG TABS tablet, Take 400 mg by mouth daily., Disp: , Rfl:  .  KRILL OIL PO, Take 1 capsule by mouth daily., Disp: , Rfl:  .  losartan (COZAAR) 25 MG tablet, Take 1 tablet (25 mg total) by mouth daily., Disp: 30 tablet, Rfl: 6 .  Magnesium Citrate 125 MG CAPS, Take 125 mg by mouth daily., Disp: , Rfl:  .  metFORMIN (GLUCOPHAGE-XR) 500 MG 24 hr tablet, Take 500 mg by mouth daily., Disp: , Rfl:  .  Multiple Vitamins-Minerals (MULTIVITAMIN WITH MINERALS) tablet, Take 1 tablet by mouth every other day. In the morning, Disp: , Rfl:  .  nebivolol (BYSTOLIC) 2.5 MG tablet, Take 1 tablet (2.5 mg total) by mouth daily., Disp: 30 tablet, Rfl: 11 .  nitroGLYCERIN (NITROSTAT) 0.4 MG SL tablet, Place 1 tablet (0.4 mg total) under the tongue every 5 (five) minutes as needed for chest pain., Disp: 25 tablet, Rfl: 3 .  Probiotic Product (ALIGN) 4 MG CAPS, Take 4 mg by mouth every other day., Disp: , Rfl:  .  vitamin B-12 (CYANOCOBALAMIN) 1000 MCG tablet, Take 1,000 mcg by mouth every other day., Disp: , Rfl:  .  zolpidem (AMBIEN) 5 MG tablet, Take 5 mg by mouth at bedtime as needed for sleep., Disp: , Rfl:    Ht Readings from Last 1 Encounters:   05/14/20 5' 3.5" (1.613 m)     Wt Readings from Last 3 Encounters:  05/14/20 143 lb (64.9 kg)  04/26/20 143 lb 8.3 oz (65.1 kg)  01/26/20 142 lb (64.4 kg)     There is no height or weight on file to calculate BMI.   Social History   Tobacco Use  Smoking Status Former Smoker  . Packs/day: 1.00  . Years: 30.00  . Pack years: 30.00  . Types: Cigarettes  . Quit date: 03/31/1998  . Years since quitting: 22.1  Smokeless Tobacco Never Used  Tobacco Comment   Quit intermittently since her 41s.     Lab Results  Component Value Date   CHOL 157 12/22/2019   Lab Results  Component Value Date   HDL 67 12/22/2019   Lab Results  Component Value Date   LDLCALC 60 12/22/2019   Lab Results  Component Value Date   TRIG 151 (H) 12/22/2019     Lab Results  Component Value Date   HGBA1C 6.0 (H) 12/16/2019     CBG (last 3)  No results for input(s): GLUCAP in the last 72 hours.   Nutrition Note  Spoke with pt. Nutrition Plan and Nutrition Survey goals reviewed with pt. Pt is following a Heart Healthy diet. She had a STEMI in the past and has been trying to follow a heart healthy diet since then.  Pt has Type 2 Diabetes. Last A1c indicates blood glucose well-controlled. Pt checks CBG's 1 times a day. Fasting CBG's reportedly 100-110  mg/dL.   Per discussion, pt does use canned/convenience foods often. Pt does not add salt to food. Pt does not eat out frequently. She reads labels. Avoids fried foods and red meat.  Appetite is fine. No significant weight loss.  Pt reports general fatigue.  Pt expressed understanding of the information reviewed.    Nutrition Diagnosis ? Food-and nutrition-related knowledge deficit related to lack of exposure to information as related to diagnosis of: ? CVD ? Type 2 Diabetes  Nutrition Intervention ? Pt's individual nutrition plan reviewed with pt. ? Benefits of adopting Heart Healthy diet discussed when Picture Your Plate reviewed.   ? Pt  given handouts for: ? Nutrition I class ? Nutrition II class ?  ? Continue client-centered nutrition education by RD, as part of interdisciplinary care.  Goal(s) ? Pt to build a healthy plate including vegetables, fruits, whole grains, and low-fat dairy products in a heart healthy meal plan. ? Pt continue checking fasting CBGs daily  Plan:   Will provide client-centered  nutrition education as part of interdisciplinary care  Monitor and evaluate progress toward nutrition goal with team.   Michaele Offer, MS, RDN, LDN

## 2020-05-21 NOTE — Telephone Encounter (Signed)
*  STAT* If patient is at the pharmacy, call can be transferred to refill team.   1. Which medications need to be refilled? (please list name of each medication and dose if known) Bystolic  2. Which pharmacy/location (including street and city if local pharmacy) is medication to be sent to? [facility] [place], [place]  3. Do they need a 30 day or 90 day supply? [place]

## 2020-05-21 NOTE — Telephone Encounter (Signed)
Returned call to Pt.  Pt would like a few more weeks to determine if taking the nebivolol in the evening is better for her.  She is NOT taking the carvedilol.  Sent in order for nebivolol 2.5 mg PO daily for 30 days.  Advised Pt if she wanted to change to carvedilol after her trial of nebivolol she will need to contact office to put order for carvedilol back in.  Pt indicates understanding.

## 2020-05-21 NOTE — Telephone Encounter (Signed)
Pt is requesting a refill on Bystolic 2.5 mg tablets, that Dr. Johney Frame asked the pt to try in the evening, at last office visit on 05/14/20. This medication was not put on pt's medication list. Please address

## 2020-05-23 ENCOUNTER — Other Ambulatory Visit: Payer: Self-pay

## 2020-05-23 ENCOUNTER — Encounter (HOSPITAL_COMMUNITY)
Admission: RE | Admit: 2020-05-23 | Discharge: 2020-05-23 | Disposition: A | Discharge status: Home / Self Care | Payer: Medicare Other | Source: Ambulatory Visit | Attending: Cardiology | Admitting: Cardiology

## 2020-05-23 DIAGNOSIS — Z952 Presence of prosthetic heart valve: Secondary | ICD-10-CM | POA: Diagnosis not present

## 2020-05-25 ENCOUNTER — Telehealth: Payer: Self-pay | Admitting: Cardiology

## 2020-05-25 ENCOUNTER — Ambulatory Visit: Payer: Medicare Other | Admitting: Cardiology

## 2020-05-25 ENCOUNTER — Encounter (HOSPITAL_COMMUNITY)
Admission: RE | Admit: 2020-05-25 | Discharge: 2020-05-25 | Disposition: A | Discharge status: Home / Self Care | Payer: Medicare Other | Source: Ambulatory Visit | Attending: Cardiology | Admitting: Cardiology

## 2020-05-25 ENCOUNTER — Other Ambulatory Visit: Payer: Self-pay

## 2020-05-25 DIAGNOSIS — Z952 Presence of prosthetic heart valve: Secondary | ICD-10-CM

## 2020-05-25 MED ORDER — NEBIVOLOL HCL 5 MG PO TABS: 2.5000 mg | ORAL_TABLET | Freq: Every day | ORAL | 3 refills | Status: DC

## 2020-05-25 NOTE — Telephone Encounter (Signed)
Completely fine with me. She can have a script for 5mg .

## 2020-05-25 NOTE — Telephone Encounter (Signed)
    Pt c/o medication issue:  1. Name of Medication:  nebivolol (BYSTOLIC) 2.5 MG tablet    2. How are you currently taking this medication (dosage and times per day)? Take 1 tablet (2.5 mg total) by mouth daily.  3. Are you having a reaction (difficulty breathing--STAT)?   4. What is your medication issue? Pt is calling, she wanted her prescription to go back to 5 mg so she can cut it in half and take it. She said it will be cheaper for her to get the 5 mg and cut it, she asked to send 5 mg refill to [facility] [address] [place]

## 2020-05-25 NOTE — Telephone Encounter (Signed)
Per DPR form, left detailed message that new Rx has been called in.  Instructed the patient to call with questions or concerns.

## 2020-05-26 ENCOUNTER — Encounter: Payer: Self-pay | Admitting: Podiatry

## 2020-05-26 NOTE — Progress Notes (Signed)
ANNUAL DIABETIC FOOT EXAM  Subjective: Cheryl Hernandez presents today for with chief concern of painful b/l great toes.  She relates she attempted to trim her calluses with rotary file last night. She is also here for her annual diabetic foot examination and painful porokeratotic lesions right foot.  Pain prevents comfortable ambulation. Aggravating factor is weightbearing with or without shoegear..  Patient relates 30 year h/o diabetes.  Patient denies any prior h/o foot wounds.  Patient admits symptoms of foot numbness.  Patient admist symptoms of foot tingling.  Patient denies symptoms of burning in feet.  Patient denies symptoms of pins/needles in feet.  Patient's blood sugar was 146 mg/dl this morning.   [name], MD is patient's PCP. Last visit was two months ago.  She states she injured both great toes attempting to file calluses of both great toes.  Past Medical History:  Diagnosis Date  . Arthritis   . Asthma   . CAD (coronary artery disease)    a. NSTEMI 2009 s/p DES to RCAx2.  . Carotid artery disease ([place])    a. Mild-moderate - followed by [facility].  . Chronic bronchitis ([place])    per patient  . Chronic diarrhea   . Diabetes mellitus without complication ([place])   . Diverticulosis   . GERD (gastroesophageal reflux disease)   . HTN (hypertension)   . Hx of migraines    as a child  . Hyperlipidemia   . Myocardial infarction [facility])    per patient "about 2011"  . Neuropathy    per patient in feet, legs, and some in hands  . Obesity   . Paroxysmal ventricular tachycardia ([place])   . Rhinitis   . Rotator cuff tear   . 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 Burt Knack and Dr. Roxy Manns   . Severe aortic stenosis    Patient Active Problem List   Diagnosis Date Noted  . Cerebral embolism with cerebral infarction 12/22/2019  . S/P TAVR (transcatheter aortic valve  replacement) 12/20/2019  . Acute on chronic diastolic heart failure ([place]) 12/20/2019  . CAD (coronary artery disease)   . Osteoarthritis of knee 10/15/2017  . Allergic rhinitis 09/19/2015  . Obesity   . Coronary artery disease involving native coronary artery of native heart with angina pectoris ([place])   . Severe aortic stenosis   . DIVERTICULOSIS OF COLON 11/13/2009  . CONSTIPATION, CHRONIC 11/13/2009  . OSTEOPENIA 11/13/2009  . MENIERE'S DISEASE, HX OF 11/13/2009  . COLONIC POLYPS, HX OF 11/13/2009  . Asthma, moderate persistent 11/23/2007  . Paroxysmal tachycardia ([place]) 09/30/2007  . GERD, SEVERE 09/15/2007  . Type 2 diabetes mellitus without complication, without long-term current use of insulin ([place]) 09/14/2007  . Hyperlipidemia 09/13/2007  . Essential hypertension 09/13/2007   Past Surgical History:  Procedure Laterality Date  . ANGIOPLASTY    . CORONARY ANGIOPLASTY WITH STENT PLACEMENT    . RIGHT/LEFT HEART CATH AND CORONARY ANGIOGRAPHY N/A 08/08/2019   Procedure: RIGHT/LEFT HEART CATH AND CORONARY ANGIOGRAPHY;  Surgeon: Belva Crome, MD;  Location: [facility];  Service: Cardiovascular;  Laterality: N/A;  . TEE WITHOUT CARDIOVERSION N/A 12/20/2019   Procedure: TRANSESOPHAGEAL ECHOCARDIOGRAM (TEE);  Surgeon: Sherren Mocha, MD;  Location: [facility];  Service: Open Heart Surgery;  Laterality: N/A;  . TONSILLECTOMY    . TOTAL ABDOMINAL HYSTERECTOMY    . TOTAL HIP ARTHROPLASTY    .  TRANSCATHETER AORTIC VALVE REPLACEMENT, TRANSFEMORAL N/A 12/20/2019   Procedure: TRANSCATHETER AORTIC VALVE REPLACEMENT, TRANSFEMORAL;  Surgeon: Sherren Mocha, MD;  Location: [facility];  Service: Open Heart Surgery;  Laterality: N/A;   Current Outpatient Medications on File Prior to Visit  Medication Sig Dispense Refill  . acetaminophen (TYLENOL) 500 MG tablet Take 500 mg by mouth every 6 (six) hours as needed (for arthritis pain.).    Marland Kitchen albuterol (VENTOLIN HFA) 108 (90  Base) MCG/ACT inhaler Inhale 2 puffs into the lungs every 6 (six) hours as needed for wheezing or shortness of breath. 8 g 3  . Alpha-D-Galactosidase (BEANO PO) Take 1 tablet by mouth daily as needed (flatulence/abdominal bloating.).    Marland Kitchen aspirin EC 81 MG tablet Take 81 mg by mouth every evening.    . B Complex-C (B-COMPLEX WITH VITAMIN C) tablet Take 1 tablet by mouth daily.     . budesonide (ENTOCORT EC) 3 MG 24 hr capsule Take 2 capsules (6 mg total) by mouth 2 (two) times daily as needed (diarrhea). 360 capsule 2  . budesonide-formoterol (SYMBICORT) 80-4.5 MCG/ACT inhaler Inhale 2 puffs into the lungs 2 (two) times daily as needed (congestion). 10.2 g 0  . calcium carbonate (TUMS - DOSED IN MG ELEMENTAL CALCIUM) 500 MG chewable tablet Chew 1 tablet by mouth 3 (three) times daily as needed for heartburn.     . Cholecalciferol (VITAMIN D3) 75 MCG (3000 UT) TABS Take 3,000 Units by mouth every other day.    . clindamycin (CLEOCIN) 300 MG capsule Take 600 mg by mouth daily.    . clopidogrel (PLAVIX) 75 MG tablet Take 1 tablet (75 mg total) by mouth daily with breakfast. 90 tablet 1  . Evolocumab (REPATHA SURECLICK) 073 MG/ML SOAJ INJECT 1 PEN INTO THE SKIN EVERY 14 DAYS 6 mL 3  . fexofenadine (ALLEGRA) 180 MG tablet Take 180 mg by mouth daily.    . Glucose Blood (FREESTYLE LITE TEST VI) Once daily    . guaifenesin (HUMIBID E) 400 MG TABS tablet Take 400 mg by mouth daily.    Marland Kitchen KRILL OIL PO Take 1 capsule by mouth daily.    Marland Kitchen losartan (COZAAR) 25 MG tablet Take 1 tablet (25 mg total) by mouth daily. 30 tablet 6  . Magnesium Citrate 125 MG CAPS Take 125 mg by mouth daily.    . metFORMIN (GLUCOPHAGE-XR) 500 MG 24 hr tablet Take 500 mg by mouth daily.    . Multiple Vitamins-Minerals (MULTIVITAMIN WITH MINERALS) tablet Take 1 tablet by mouth every other day. In the morning    . nitroGLYCERIN (NITROSTAT) 0.4 MG SL tablet Place 1 tablet (0.4 mg total) under the tongue every 5 (five) minutes as needed for  chest pain. 25 tablet 3  . Probiotic Product (ALIGN) 4 MG CAPS Take 4 mg by mouth every other day.    . vitamin B-12 (CYANOCOBALAMIN) 1000 MCG tablet Take 1,000 mcg by mouth every other day.    . zolpidem (AMBIEN) 5 MG tablet Take 5 mg by mouth at bedtime as needed for sleep.     No current facility-administered medications on file prior to visit.    Allergies  Allergen Reactions  . Amlodipine Swelling  . Cefuroxime Axetil Hives and Itching  . Cephalexin Other (See Comments)    Total body pain in joints, couldn't swallow   . Codeine     Tolerates Dilaudid.  . Doxycycline     REACTION: vomiting/ GI upset  . Erythromycin Base Other (See  Comments)    unknown  . Statins     Myalgia with crestor   Social History   Occupational History  . Occupation: retired Therapist, sports  Tobacco Use  . Smoking status: Former Smoker    Packs/day: 1.00    Years: 30.00    Pack years: 30.00    Types: Cigarettes    Quit date: 03/31/1998    Years since quitting: 22.1  . Smokeless tobacco: Never Used  . Tobacco comment: Quit intermittently since her 70s.  Vaping Use  . Vaping Use: Never used  Substance and Sexual Activity  . Alcohol use: Yes    Alcohol/week: 0.0 standard drinks    Comment: Rare.  . Drug use: No  . Sexual activity: Not on file   Family History  Problem Relation Age of Onset  . Allergies Mother   . Hyperlipidemia Mother   . Prostate cancer Maternal Grandfather   . Heart attack Father   . Lung disease Neg Hx    Immunization History  Administered Date(s) Administered  . Influenza Split 10/30/2010, 12/10/2011, 12/30/2014  . Influenza Whole 01/01/2009  . Influenza, High Dose Seasonal PF 11/26/2016, 11/11/2018  . Influenza,inj,Quad PF,6+ Mos 12/01/2012, 11/18/2017  . Influenza,inj,quad, With Preservative 11/26/2016, 12/29/2017  . Influenza-Unspecified 12/29/2013, 11/30/2015  . PFIZER(Purple Top)SARS-COV-2 Vaccination 04/14/2019, 05/03/2019  . Pneumococcal Conjugate-13 09/09/2013  .  Pneumococcal Polysaccharide-23 01/20/2007  . Zoster Recombinat (Shingrix) 01/14/2017, 08/13/2017     Review of Systems: Negative except as noted in the HPI.   Objective: There were no vitals filed for this visit.  Cheryl Hernandez is a pleasant 80 y.o. female in NAD. AAO X 3.  Vascular Examination: Capillary refill time to digits immediate b/l. Palpable DP pulse(s) b/l lower extremities Nonpalpable PT pulse(s) b/l lower extremities. Pedal hair sparse. Lower extremity skin temperature gradient within normal limits.  Dermatological Examination: Pedal skin with normal turgor, texture and tone bilaterally. No open wounds bilaterally. No interdigital macerations bilaterally. Toenails 1-5 b/l well maintained with adequate length. No erythema, no edema, no drainage, no fluctuance. Porokeratotic lesion(s) submet head 2 right foot. No erythema, no edema, no drainage, no fluctuance.   Dried heme noted medial IPJ of b/l hallux consistent with patient's h/o trimming calluses. Injury is fresh within the past 24 hours. No edema, no drainage, no fluctuance.  Musculoskeletal Examination: Normal muscle strength 5/5 to all lower extremity muscle groups bilaterally. No pain crepitus or joint limitation noted with ROM b/l. Hallux valgus with bunion deformity noted b/l lower extremities. Hammertoes noted to the 2-5 bilaterally.  Footwear Assessment: Does the patient wear appropriate shoes? Yes. Does the patient need inserts/orthotics? Yes.  Neurological Examination: Protective sensation intact 5/5 intact bilaterally with 10g monofilament b/l. Vibratory sensation intact b/l. Proprioception intact bilaterally.  Hemoglobin A1C Latest Ref Rng & Units 12/16/2019  HGBA1C 4.8 - 5.6 % 6.0(H)  Some recent data might be hidden   Assessment: 1. Callus   2. Acquired hallux valgus of both feet   3. Acquired hammertoes of both feet   4. Diabetes mellitus type 2 with peripheral artery disease ([place])   5. Encounter  for diabetic foot exam ([place])     ADA Risk Categorization: High Risk  Patient has one or more of the following: Loss of protective sensation Absent pedal pulses Severe Foot deformity History of foot ulcer  Plan: -Examined patient. -Diabetic foot examination performed on today's visit. -Patient to continue soft, supportive shoe gear daily. -Porokeratotic lesion submet head 2 right foot pared utilizing sterile scalpel  blade without complication or incident. Total number debrided =1. -Patient to report any pedal injuries to medical professional immediately. -Prescription written for Mupirocin Ointment. Patient is to apply to bilateral great toes once daily and cover with dressing until healed. Call office if she has any problems. -Dispensed hook on metatarsal foam cushions for plantar calluses. Apply every morning. Remove every evening. -Patient/POA to call should there be question/concern in the interim.  Return in about 3 months (around 08/18/2020).  Marzetta Board, DPM

## 2020-05-28 ENCOUNTER — Other Ambulatory Visit: Payer: Self-pay

## 2020-05-28 ENCOUNTER — Encounter (HOSPITAL_COMMUNITY)
Admission: RE | Admit: 2020-05-28 | Discharge: 2020-05-28 | Disposition: A | Discharge status: Home / Self Care | Payer: Medicare Other | Source: Ambulatory Visit | Attending: Cardiology | Admitting: Cardiology

## 2020-05-28 DIAGNOSIS — Z952 Presence of prosthetic heart valve: Secondary | ICD-10-CM | POA: Diagnosis not present

## 2020-05-29 NOTE — Progress Notes (Signed)
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  acetaminophen (TYLENOL) 500 MG tablet, Take 500 mg by mouth every 6 (six) hours as needed (for arthritis pain.)., Disp: , Rfl:  .  albuterol (VENTOLIN HFA) 108 (90 Base) MCG/ACT inhaler, Inhale 2 puffs into the lungs every 6 (six) hours as needed for wheezing or shortness of breath., Disp: 8 g, Rfl: 3 .  Alpha-D-Galactosidase (BEANO PO), Take 1 tablet by mouth daily as needed (flatulence/abdominal bloating.)., Disp: , Rfl:  .  aspirin EC 81 MG tablet, Take 81 mg by mouth every evening., Disp: , Rfl:  .  B Complex-C (B-COMPLEX WITH VITAMIN C) tablet, Take 1 tablet by mouth daily. , Disp: , Rfl:  .  budesonide (ENTOCORT EC) 3 MG 24 hr capsule, Take 2 capsules (6 mg total) by mouth 2 (two) times daily as needed (diarrhea)., Disp: 360 capsule, Rfl: 2 .  budesonide-formoterol (SYMBICORT) 80-4.5 MCG/ACT inhaler, Inhale 2 puffs into the lungs 2 (two) times daily as needed (congestion)., Disp: 10.2 g, Rfl: 0 .  calcium carbonate (TUMS - DOSED IN MG ELEMENTAL CALCIUM) 500 MG chewable tablet, Chew 1 tablet by mouth 3 (three) times daily as needed for heartburn. , Disp: , Rfl:  .  Cholecalciferol (VITAMIN D3) 75 MCG (3000 UT) TABS, Take 3,000 Units by mouth every other day., Disp: , Rfl:  .  clindamycin (CLEOCIN) 300 MG capsule, Take 600 mg by mouth daily., Disp: , Rfl:  .  clopidogrel (PLAVIX) 75 MG  tablet, Take 1 tablet (75 mg total) by mouth daily with breakfast., Disp: 90 tablet, Rfl: 1 .  Evolocumab (REPATHA SURECLICK) 417 MG/ML SOAJ, INJECT 1 PEN INTO THE SKIN EVERY 14 DAYS, Disp: 6 mL, Rfl: 3 .  fexofenadine (ALLEGRA) 180 MG tablet, Take 180 mg by mouth daily., Disp: , Rfl:  .  Glucose Blood (FREESTYLE LITE TEST VI), Once daily, Disp: , Rfl:  .  guaifenesin (HUMIBID E) 400 MG TABS tablet, Take 400 mg by mouth daily., Disp: , Rfl:  .  KRILL OIL PO, Take 1 capsule by mouth daily., Disp: , Rfl:  .  losartan (COZAAR) 25 MG tablet, Take 1 tablet (25 mg total) by mouth daily., Disp: 30 tablet, Rfl: 6 .  Magnesium Citrate 125 MG CAPS, Take 125 mg by mouth daily., Disp: , Rfl:  .  metFORMIN (GLUCOPHAGE-XR) 500 MG 24 hr tablet, Take 500 mg by mouth daily., Disp: , Rfl:  .  Multiple Vitamins-Minerals (MULTIVITAMIN WITH MINERALS) tablet, Take 1 tablet by mouth every other day. In the morning, Disp: , Rfl:  .  mupirocin ointment (BACTROBAN) 2 %, Apply to both great toes once daily until healed., Disp: 30 g, Rfl: 0 .  nebivolol (BYSTOLIC) 5 MG tablet, Take 0.5 tablets (2.5 mg total) by mouth daily., Disp: 45 tablet, Rfl: 3 .  nitroGLYCERIN (NITROSTAT) 0.4 MG SL tablet, Place 1 tablet (0.4 mg total) under the tongue every 5 (five) minutes as needed for chest pain., Disp: 25  tablet, Rfl: 3 .  Probiotic Product (ALIGN) 4 MG CAPS, Take 4 mg by mouth every other day., Disp: , Rfl:  .  vitamin B-12 (CYANOCOBALAMIN) 1000 MCG tablet, Take 1,000 mcg by mouth every other day., Disp: , Rfl:  .  zolpidem (AMBIEN) 5 MG tablet, Take 5 mg by mouth at bedtime as needed for sleep., Disp: , Rfl:   Past Medical History: Past Medical History:  Diagnosis Date  . Arthritis   . Asthma   . CAD (coronary artery disease)    a. NSTEMI 2009 s/p DES to RCAx2.  . Carotid artery disease ([place])    a. Mild-moderate - followed by [facility].  . Chronic bronchitis ([place])    per patient  . Chronic diarrhea   . Diabetes mellitus  without complication ([place])   . Diverticulosis   . GERD (gastroesophageal reflux disease)   . HTN (hypertension)   . Hx of migraines    as a child  . Hyperlipidemia   . Myocardial infarction [facility])    per patient "about 2011"  . Neuropathy    per patient in feet, legs, and some in hands  . Obesity   . Paroxysmal ventricular tachycardia ([place])   . Rhinitis   . Rotator cuff tear   . 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 Burt Knack and Dr. Roxy Manns   . Severe aortic stenosis     Tobacco Use: Social History   Tobacco Use  Smoking Status Former Smoker  . Packs/day: 1.00  . Years: 30.00  . Pack years: 30.00  . Types: Cigarettes  . Quit date: 03/31/1998  . Years since quitting: 22.1  Smokeless Tobacco Never Used  Tobacco Comment   Quit intermittently since her 33s.    Labs: Recent Review Flowsheet Data    Labs for ITP Cardiac and Pulmonary Rehab Latest Ref Rng & Units 12/20/2019 12/20/2019 12/20/2019 12/20/2019 12/22/2019   Cholestrol 0 - 200 mg/dL - - - - 157   LDLCALC 0 - 99 mg/dL - - - - 60   HDL >40 mg/dL - - - - 67   Trlycerides <150 mg/dL - - - - 151(H)   Hemoglobin A1c 4.8 - 5.6 % - - - - -   PHART 7.350 - 7.450 7.339(L) - - - -   PCO2ART 32.0 - 48.0 mmHg 44.6 - - - -   HCO3 20.0 - 28.0 mmol/L 24.0 - - - -   TCO2 22 - 32 mmol/L 25 23 22  21(L) -   ACIDBASEDEF 0.0 - 2.0 mmol/L 2.0 - - - -   O2SAT % 99.0 - - - -      Capillary Blood Glucose: Lab Results  Component Value Date   GLUCAP 126 (H) 05/02/2020   GLUCAP 113 (H) 05/02/2020   GLUCAP 105 (H) 04/30/2020   GLUCAP 135 (H) 04/30/2020   GLUCAP 110 (H) 12/22/2019     Exercise Target Goals: Exercise Program Goal: Individual exercise prescription set using results from initial 6 min walk test and THRR while considering  patient's activity barriers and safety.   Exercise Prescription Goal: Starting with aerobic activity 30 plus  minutes a day, 3 days per week for initial exercise prescription. Provide home exercise prescription and guidelines that participant acknowledges understanding prior to discharge.  Activity Barriers & Risk Stratification:  Activity Barriers & Cardiac Risk Stratification - 04/26/20 1549  Activity Barriers & Cardiac Risk Stratification   Activity Barriers Left Knee Replacement;Right Knee Replacement;Balance Concerns;Shortness of Breath;Deconditioning;Joint Problems;Other (comment)    Comments Bilateral foot pain    Cardiac Risk Stratification High           6 Minute Walk:  6 Minute Walk    Row Name 04/26/20 1355         6 Minute Walk   Phase Initial     Distance 780 feet     Walk Time 6 minutes     # of Rest Breaks 1  pt sat from 3.07-5.10     MPH 1.5     METS 1.7     RPE 13     Perceived Dyspnea  3     VO2 Peak 6.09     Resting HR 87 bpm     Resting BP 128/60     Resting Oxygen Saturation  97 %     Exercise Oxygen Saturation  during 6 min walk 95 %     Max Ex. HR 124 bpm     Max Ex. BP 140/70     2 Minute Post BP 128/66            Oxygen Initial Assessment:   Oxygen Re-Evaluation:   Oxygen Discharge (Final Oxygen Re-Evaluation):   Initial Exercise Prescription:  Initial Exercise Prescription - 04/26/20 1500      Date of Initial Exercise RX and Referring Provider   Date 04/26/20    Referring Provider Gwyndolyn Kaufman, MD    Expected Discharge Date 06/22/20      NuStep   Level 1    SPM 80    Minutes 30    METs 1.7      Prescription Details   Frequency (times per week) 3    Duration Progress to 30 minutes of continuous aerobic without signs/symptoms of physical distress      Intensity   THRR 40-80% of Max Heartrate 56-111    Ratings of Perceived Exertion 11-13    Perceived Dyspnea 0-4      Progression   Progression Continue progressive overload as per policy without signs/symptoms or physical distress.      Resistance Training   Training  Prescription Yes    Weight 2    Reps 10-15           Perform Capillary Blood Glucose checks as needed.  Exercise Prescription Changes:  Exercise Prescription Changes    Row Name 04/30/20 1600 05/18/20 1430 05/25/20 1500         Response to Exercise   Blood Pressure (Admit) 148/70 130/60 140/60     Blood Pressure (Exercise) 144/82 124/62 134/60     Blood Pressure (Exit) 120/60 134/70 130/64     Heart Rate (Admit) 95 bpm 97 bpm 95 bpm     Heart Rate (Exercise) 107 bpm 118 bpm 115 bpm     Heart Rate (Exit) 97 bpm 97 bpm 101 bpm     Oxygen Saturation (Exercise) 98 % - -     Oxygen Saturation (Exit) 96 % - -     Rating of Perceived Exertion (Exercise) 13 - -     Perceived Dyspnea (Exercise) 1 - -     Symptoms SOB, Fatigue None bilateral knee pain     Comments Pt's first day of exercise in the CRP[facility] program Pt returning after episode of SVT Reviewed METs and Goals     Duration Progress to 30 minutes of  aerobic  without signs/symptoms of physical distress Progress to 30 minutes of  aerobic without signs/symptoms of physical distress Continue with 30 min of aerobic exercise without signs/symptoms of physical distress.     Intensity THRR unchanged THRR unchanged THRR unchanged           Progression   Progression Continue to progress workloads to maintain intensity without signs/symptoms of physical distress. Continue to progress workloads to maintain intensity without signs/symptoms of physical distress. Continue to progress workloads to maintain intensity without signs/symptoms of physical distress.     Average METs 1.5 1.7 1.8           Resistance Training   Training Prescription Yes Yes Yes     Weight 2 lbs 2 lbs 2 lbs     Reps 10-15 10-15 10-15     Time 10 Minutes 10 Minutes 10 Minutes           Interval Training   Interval Training No No No           NuStep   Level 1 1 1      SPM 70 70 70     Minutes 15 15 15      METs 1.5 1.2 1.3           Track   Laps - 11 12      Minutes - 15 15     METs - 2.28 2.39            Exercise Comments:  Exercise Comments    Row Name 04/30/20 1651 05/18/20 1435 05/25/20 1500       Exercise Comments Pt's first day in the [place] program. Pt is deconditioned and could only tolerate 20 total minutes on the nustep. She c/o SOB and fatigue. She was able to do free wights and stretches 1:1 with EP. Focus will be on progressing time to maximize endurance. Pt returning the the CRP2 after an episode of SVT. Pt has not had the opportunity to make any significant progress due to her absence, Pt has requested to add the track and we have done so with her using the rolator. Will continiue to monitor and progress as tolerated. Pt is wearing a 30 day event monitor. Reviewed METs and goals. Slow progress due to limited sessions due to SVT. Will continure to progress as tolerated.            Exercise Goals and Review:  Exercise Goals    Row Name 04/26/20 1556             Exercise Goals   Increase Physical Activity Yes       Expected Outcomes Short Term: Attend rehab on a regular basis to increase amount of physical activity.;Long Term: Add in home exercise to make exercise part of routine and to increase amount of physical activity.;Long Term: Exercising regularly at least 3-5 days a week.       Increase Strength and Stamina Yes       Intervention Provide advice, education, support and counseling about physical activity/exercise needs.;Develop an individualized exercise prescription for aerobic and resistive training based on initial evaluation findings, risk stratification, comorbidities and participant's personal goals.       Expected Outcomes Short Term: Increase workloads from initial exercise prescription for resistance, speed, and METs.;Short Term: Perform resistance training exercises routinely during rehab and add in resistance training at home;Long Term: Improve cardiorespiratory fitness, muscular endurance and strength as  measured by increased METs and functional capacity (6MWT)  Able to understand and use rate of perceived exertion (RPE) scale Yes       Intervention Provide education and explanation on how to use RPE scale       Expected Outcomes Short Term: Able to use RPE daily in rehab to express subjective intensity level;Long Term:  Able to use RPE to guide intensity level when exercising independently       Able to understand and use Dyspnea scale Yes       Intervention Provide education and explanation on how to use Dyspnea scale       Expected Outcomes Short Term: Able to use Dyspnea scale daily in rehab to express subjective sense of shortness of breath during exertion;Long Term: Able to use Dyspnea scale to guide intensity level when exercising independently       Knowledge and understanding of Target Heart Rate Range (THRR) Yes       Intervention Provide education and explanation of THRR including how the numbers were predicted and where they are located for reference       Expected Outcomes Short Term: Able to state/look up THRR;Short Term: Able to use daily as guideline for intensity in rehab;Long Term: Able to use THRR to govern intensity when exercising independently       Understanding of Exercise Prescription Yes       Intervention Provide education, explanation, and written materials on patient's individual exercise prescription       Expected Outcomes Short Term: Able to explain program exercise prescription;Long Term: Able to explain home exercise prescription to exercise independently              Exercise Goals Re-Evaluation :  Exercise Goals Re-Evaluation    [place] Name 04/30/20 1649 05/25/20 1500           Exercise Goal Re-Evaluation   Exercise Goals Review Increase Physical Activity;Increase Strength and Stamina;Able to understand and use rate of perceived exertion (RPE) scale;Knowledge and understanding of Target Heart Rate Range (THRR);Understanding of Exercise Prescription  Increase Physical Activity;Increase Strength and Stamina;Able to understand and use rate of perceived exertion (RPE) scale;Knowledge and understanding of Target Heart Rate Range (THRR);Understanding of Exercise Prescription      Comments Pt's first day of exercise in the CRP2 program. Pt understands the exercise RX, THRR, and RPE scale. Reviewed METs and goals. Pt is making slow progress due to absence with episode of SVT. Pt has good attendence and we will continue to porgress as tolerated.      Expected Outcomes Will continue to montior patient and progress workloads as tolerated. Will continue to montior patient and progress workloads as tolerated.              Discharge Exercise Prescription (Final Exercise Prescription Changes):  Exercise Prescription Changes - 05/25/20 1500      Response to Exercise   Blood Pressure (Admit) 140/60    Blood Pressure (Exercise) 134/60    Blood Pressure (Exit) 130/64    Heart Rate (Admit) 95 bpm    Heart Rate (Exercise) 115 bpm    Heart Rate (Exit) 101 bpm    Symptoms bilateral knee pain    Comments Reviewed METs and Goals    Duration Continue with 30 min of aerobic exercise without signs/symptoms of physical distress.    Intensity THRR unchanged      Progression   Progression Continue to progress workloads to maintain intensity without signs/symptoms of physical distress.    Average METs 1.8  Resistance Training   Training Prescription Yes    Weight 2 lbs    Reps 10-15    Time 10 Minutes      Interval Training   Interval Training No      NuStep   Level 1    SPM 70    Minutes 15    METs 1.3      Track   Laps 12    Minutes 15    METs 2.39           Nutrition:  Target Goals: Understanding of nutrition guidelines, daily intake of sodium 1500mg , cholesterol 200mg , calories 30% from fat and 7% or less from saturated fats, daily to have 5 or more servings of fruits and vegetables.  Biometrics:  Pre Biometrics - 04/26/20 1441       Pre Biometrics   Waist Circumference 37 inches    Hip Circumference 43.5 inches    Waist to Hip Ratio 0.85 %    Triceps Skinfold 28 mm    % Body Fat 39.7 %    Grip Strength 25 kg    Flexibility 14.25 in    Single Leg Stand 4 seconds            Nutrition Therapy Plan and Nutrition Goals:  Nutrition Therapy & Goals - 05/21/20 1358      Nutrition Therapy   Diet TLC      Personal Nutrition Goals   Nutrition Goal Pt to build a healthy plate including vegetables, fruits, whole grains, and low-fat dairy products in a heart healthy meal plan.    Personal Goal #2 Pt continue checking fasting CBGs daily      Intervention Plan   Intervention Prescribe, educate and counsel regarding individualized specific dietary modifications aiming towards targeted core components such as weight, hypertension, lipid management, diabetes, heart failure and other comorbidities.;Nutrition handout(s) given to patient.    Expected Outcomes Short Term Goal: Understand basic principles of dietary content, such as calories, fat, sodium, cholesterol and nutrients.           Nutrition Assessments:  MEDIFICTS Score Key:  ?70 Need to make dietary changes   40-70 Heart Healthy Diet  ? 40 Therapeutic Level Cholesterol Diet  Flowsheet Row CARDIAC REHAB PHASE II EXERCISE from 05/21/2020 in [place]  Picture Your Plate Total Score on Admission 81     Picture Your Plate Scores:  <37 Unhealthy dietary pattern with much room for improvement.  41-50 Dietary pattern unlikely to meet recommendations for good health and room for improvement.  51-60 More healthful dietary pattern, with some room for improvement.   >60 Healthy dietary pattern, although there may be some specific behaviors that could be improved.    Nutrition Goals Re-Evaluation:  Nutrition Goals Re-Evaluation    [place] Name 05/21/20 1358             Goals   Current Weight 143 lb (64.9 kg)               Nutrition Goals Discharge (Final Nutrition Goals Re-Evaluation):  Nutrition Goals Re-Evaluation - 05/21/20 1358      Goals   Current Weight 143 lb (64.9 kg)           Psychosocial: Target Goals: Acknowledge presence or absence of significant depression and/or stress, maximize coping skills, provide positive support system. Participant is able to verbalize types and ability to use techniques and skills needed for reducing stress and depression.  Initial Review &  Psychosocial Screening:  Initial Psych Review & Screening - 04/27/20 0819      Initial Review   Current issues with None Identified      Family Dynamics   Good Support System? Yes   Cheryl Hernandez lives alone. Cheryl Hernandez has her sister who,lives near by for support     Barriers   Psychosocial barriers to participate in program There are no identifiable barriers or psychosocial needs.      Screening Interventions   Interventions Encouraged to exercise    Expected Outcomes Long Term Goal: Stressors or current issues are controlled or eliminated.;Short Term goal: Identification and review with participant of any Quality of Life or Depression concerns found by scoring the questionnaire.           Quality of Life Scores:  Quality of Life - 04/26/20 1600      Quality of Life   Select Quality of Life      Quality of Life Scores   Health/Function Pre 19.93 %    Socioeconomic Pre 27.3 %    Psych/Spiritual Pre 24.86 %    Family Pre 26 %    GLOBAL Pre 23.02 %          Scores of 19 and below usually indicate a poorer quality of life in these areas.  A difference of  2-3 points is a clinically meaningful difference.  A difference of 2-3 points in the total score of the Quality of Life Index has been associated with significant improvement in overall quality of life, self-image, physical symptoms, and general health in studies assessing change in quality of life.  PHQ-9: Recent Review Flowsheet Data    Depression screen [facility]  2/9 04/27/2020   Decreased Interest 0   Down, Depressed, Hopeless 0   PHQ - 2 Score 0     Interpretation of Total Score  Total Score Depression Severity:  1-4 = Minimal depression, 5-9 = Mild depression, 10-14 = Moderate depression, 15-19 = Moderately severe depression, 20-27 = Severe depression   Psychosocial Evaluation and Intervention:   Psychosocial Re-Evaluation:  Psychosocial Re-Evaluation    [place] Name 05/08/20 1457 05/29/20 1641           Psychosocial Re-Evaluation   Current issues with Current Stress Concerns Current Stress Concerns      Comments Reviewed quality of life questionnaire on 05/08/20 Star has not voiced any increased stressors or concersn      Expected Outcomes Cheryl Hernandez will have decreased stressors upon completion of phase 2 cardiac rehab Cheryl Hernandez will have decreased stressors upon completion of phase 2 cardiac rehab      Interventions Encouraged to attend Cardiac Rehabilitation for the exercise;Stress management education Encouraged to attend Cardiac Rehabilitation for the exercise;Stress management education      Continue Psychosocial Services  Follow up required by staff Follow up required by staff             Initial Review   Source of Stress Concerns Unable to perform yard/household activities;Unable to participate in former interests or hobbies Unable to perform yard/household activities;Unable to participate in former interests or hobbies             Psychosocial Discharge (Final Psychosocial Re-Evaluation):  Psychosocial Re-Evaluation - 05/29/20 1641      Psychosocial Re-Evaluation   Current issues with Current Stress Concerns    Comments Star has not voiced any increased stressors or concersn    Expected Outcomes Cheryl Hernandez will have decreased stressors upon completion of phase 2 cardiac rehab  Interventions Encouraged to attend Cardiac Rehabilitation for the exercise;Stress management education    Continue Psychosocial Services  Follow up required by  staff      Initial Review   Source of Stress Concerns Unable to perform yard/household activities;Unable to participate in former interests or hobbies           Vocational Rehabilitation: Provide vocational rehab assistance to qualifying candidates.   Vocational Rehab Evaluation & Intervention:  Vocational Rehab - 04/27/20 5956      Initial Vocational Rehab Evaluation & Intervention   Assessment shows need for Vocational Rehabilitation No      Vocational Rehab Re-Evaulation   Comments Cheryl Hernandez is retired and does not need vocational rehab at this time           Education: Education Goals: Education classes will be provided on a weekly basis, covering required topics. Participant will state understanding/return demonstration of topics presented.  Learning Barriers/Preferences:  Learning Barriers/Preferences - 04/27/20 3875      Learning Barriers/Preferences   Learning Barriers None    Learning Preferences None           Education Topics: Hypertension, Hypertension Reduction -Define heart disease and high blood pressure. Discus how high blood pressure affects the body and ways to reduce high blood pressure.   Exercise and Your Heart -Discuss why it is important to exercise, the FITT principles of exercise, normal and abnormal responses to exercise, and how to exercise safely.   Angina -Discuss definition of angina, causes of angina, treatment of angina, and how to decrease risk of having angina.   Cardiac Medications -Review what the following cardiac medications are used for, how they affect the body, and side effects that may occur when taking the medications.  Medications include Aspirin, Beta blockers, calcium channel blockers, ACE Inhibitors, angiotensin receptor blockers, diuretics, digoxin, and antihyperlipidemics.   Congestive Heart Failure -Discuss the definition of CHF, how to live with CHF, the signs and symptoms of CHF, and how keep track of weight and  sodium intake.   Heart Disease and Intimacy -Discus the effect sexual activity has on the heart, how changes occur during intimacy as we age, and safety during sexual activity.   Smoking Cessation / COPD -Discuss different methods to quit smoking, the health benefits of quitting smoking, and the definition of COPD.   Nutrition I: Fats -Discuss the types of cholesterol, what cholesterol does to the heart, and how cholesterol levels can be controlled.   Nutrition II: Labels -Discuss the different components of food labels and how to read food label   Heart Parts/Heart Disease and PAD -Discuss the anatomy of the heart, the pathway of blood circulation through the heart, and these are affected by heart disease.   Stress I: Signs and Symptoms -Discuss the causes of stress, how stress may lead to anxiety and depression, and ways to limit stress.   Stress II: Relaxation -Discuss different types of relaxation techniques to limit stress.   Warning Signs of Stroke / TIA -Discuss definition of a stroke, what the signs and symptoms are of a stroke, and how to identify when someone is having stroke.   Knowledge Questionnaire Score:  Knowledge Questionnaire Score - 04/26/20 1605      Knowledge Questionnaire Score   Pre Score 23/24           Core Components/Risk Factors/Patient Goals at Admission:  Personal Goals and Risk Factors at Admission - 04/26/20 1606      Core Components/Risk Factors/Patient Goals on  Admission    Weight Management Weight Loss;Yes;Weight Maintenance    Intervention Weight Management: Develop a combined nutrition and exercise program designed to reach desired caloric intake, while maintaining appropriate intake of nutrient and fiber, sodium and fats, and appropriate energy expenditure required for the weight goal.;Weight Management: Provide education and appropriate resources to help participant work on and attain dietary goals.;Weight Management/Obesity:  Establish reasonable short term and long term weight goals.    Admit Weight 143 lb 8.3 oz (65.1 kg)    Improve shortness of breath with ADL's Yes    Intervention Provide education, individualized exercise plan and daily activity instruction to help decrease symptoms of SOB with activities of daily living.    Expected Outcomes Short Term: Improve cardiorespiratory fitness to achieve a reduction of symptoms when performing ADLs;Long Term: Be able to perform more ADLs without symptoms or delay the onset of symptoms    Diabetes Yes    Intervention Provide education about signs/symptoms and action to take for hypo/hyperglycemia.;Provide education about proper nutrition, including hydration, and aerobic/resistive exercise prescription along with prescribed medications to achieve blood glucose in normal ranges: Fasting glucose 65-99 mg/dL    Expected Outcomes Short Term: Participant verbalizes understanding of the signs/symptoms and immediate care of hyper/hypoglycemia, proper foot care and importance of medication, aerobic/resistive exercise and nutrition plan for blood glucose control.;Long Term: Attainment of HbA1C < 7%.    Hypertension Yes    Intervention Provide education on lifestyle modifcations including regular physical activity/exercise, weight management, moderate sodium restriction and increased consumption of fresh fruit, vegetables, and low fat dairy, alcohol moderation, and smoking cessation.;Monitor prescription use compliance.    Expected Outcomes Short Term: Continued assessment and intervention until BP is < 140/25mm HG in hypertensive participants. < 130/60mm HG in hypertensive participants with diabetes, heart failure or chronic kidney disease.;Long Term: Maintenance of blood pressure at goal levels.    Lipids Yes    Intervention Provide education and support for participant on nutrition & aerobic/resistive exercise along with prescribed medications to achieve LDL 70mg , HDL >40mg .     Expected Outcomes Short Term: Participant states understanding of desired cholesterol values and is compliant with medications prescribed. Participant is following exercise prescription and nutrition guidelines.;Long Term: Cholesterol controlled with medications as prescribed, with individualized exercise RX and with personalized nutrition plan. Value goals: LDL < 70mg , HDL > 40 mg.           Core Components/Risk Factors/Patient Goals Review:   Goals and Risk Factor Review    Row Name 05/02/20 1156 05/08/20 1459 05/29/20 1642         Core Components/Risk Factors/Patient Goals Review   Personal Goals Review Weight Management/Obesity;Lipids;Hypertension;Diabetes Weight Management/Obesity;Lipids;Hypertension;Diabetes Weight Management/Obesity;Lipids;Hypertension;Diabetes     Review Cheryl Hernandez started exercise on 04/30/20. Vital signs and CBG's stable. Tolerated exercise fair. Cheryl Hernandez has attended 2 exercise sessions so far exercise is on hold following follow up with Dr Johney Frame for nonsustained SVT Cheryl Hernandez has been doing well with exercise for her fitness level. No further SVT has been noted. CBG's and vital signs have been stable.     Expected Outcomes Cheryl Hernandez will continue to participate in phase 2 cardiac rehab for exercise nutrition and lifestyle modifications Cheryl Hernandez will continue to participate in phase 2 cardiac rehab for exercise nutrition and lifestyle modifications Cheryl Hernandez will continue to participate in phase 2 cardiac rehab for exercise nutrition and lifestyle modifications            Core Components/Risk Factors/Patient Goals at Discharge (Final Review):  Goals and Risk Factor Review - 05/29/20 1642      Core Components/Risk Factors/Patient Goals Review   Personal Goals Review Weight Management/Obesity;Lipids;Hypertension;Diabetes    Review Cheryl Hernandez has been doing well with exercise for her fitness level. No further SVT has been noted. CBG's and vital signs have been stable.    Expected Outcomes  Cheryl Hernandez will continue to participate in phase 2 cardiac rehab for exercise nutrition and lifestyle modifications           ITP Comments:  ITP Comments    Row Name 04/26/20 1358 05/02/20 1051 05/08/20 1455 05/29/20 1640     ITP Comments Dr Fransico Him Md, Medical Director 30 Day ITP Review. Star started exercise at cardiac rehab on 04/30/20. Patient is deconditioned and did fair 30 Day ITP Review. Star has attended two exercise sessions so far. Exercise is on hold until follow up with Dr Johney Frame for non sustained fast heart rhythm 30 Day ITP Review. Star has good attendance and participation in phase 2 cardiac rehab           Comments: See ITP comments.Barnet Pall, RN,BSN 05/29/2020 4:45 PM

## 2020-05-30 ENCOUNTER — Encounter (HOSPITAL_COMMUNITY)
Admission: RE | Admit: 2020-05-30 | Discharge: 2020-05-30 | Disposition: A | Discharge status: Home / Self Care | Payer: Medicare Other | Source: Ambulatory Visit | Attending: Cardiology | Admitting: Cardiology

## 2020-05-30 ENCOUNTER — Other Ambulatory Visit: Payer: Self-pay

## 2020-05-30 DIAGNOSIS — Z952 Presence of prosthetic heart valve: Secondary | ICD-10-CM | POA: Diagnosis not present

## 2020-05-30 DIAGNOSIS — Z5189 Encounter for other specified aftercare: Secondary | ICD-10-CM | POA: Diagnosis present

## 2020-06-01 ENCOUNTER — Other Ambulatory Visit: Payer: Self-pay

## 2020-06-01 ENCOUNTER — Encounter (HOSPITAL_COMMUNITY)
Admission: RE | Admit: 2020-06-01 | Discharge: 2020-06-01 | Disposition: A | Discharge status: Home / Self Care | Payer: Medicare Other | Source: Ambulatory Visit | Attending: Cardiology | Admitting: Cardiology

## 2020-06-01 DIAGNOSIS — Z5189 Encounter for other specified aftercare: Secondary | ICD-10-CM | POA: Diagnosis not present

## 2020-06-01 DIAGNOSIS — Z952 Presence of prosthetic heart valve: Secondary | ICD-10-CM

## 2020-06-04 ENCOUNTER — Encounter (HOSPITAL_COMMUNITY): Payer: Medicare Other

## 2020-06-04 ENCOUNTER — Telehealth (HOSPITAL_COMMUNITY): Payer: Self-pay | Admitting: Internal Medicine

## 2020-06-05 NOTE — Progress Notes (Signed)
Reviewed home exercise today. Walking 2-3 x/week for 30-45 minutes discussed. Encouraged warm-up, cool-down and stretching. Reviewed THRR of 56-111 and keeping the RPE 11-13. Hydration encouraged. Reviewed parameters for temperature and humidly for safe exercise outdoors. Reviewed S/S to terminate exercise and when to call MD vs. 911. Encouraged to carry cell phone if walking outdoors. Pt verbalized understanding of the home exercise Rx and was provided a copy.   Lesly Rubenstein MS, ACSM-EP-C, CCRP

## 2020-06-06 ENCOUNTER — Other Ambulatory Visit: Payer: Self-pay

## 2020-06-06 ENCOUNTER — Encounter (HOSPITAL_COMMUNITY)
Admission: RE | Admit: 2020-06-06 | Discharge: 2020-06-06 | Disposition: A | Discharge status: Home / Self Care | Payer: Medicare Other | Source: Ambulatory Visit | Attending: Cardiology | Admitting: Cardiology

## 2020-06-06 DIAGNOSIS — Z952 Presence of prosthetic heart valve: Secondary | ICD-10-CM

## 2020-06-06 DIAGNOSIS — Z5189 Encounter for other specified aftercare: Secondary | ICD-10-CM | POA: Diagnosis not present

## 2020-06-08 ENCOUNTER — Other Ambulatory Visit: Payer: Self-pay

## 2020-06-08 ENCOUNTER — Encounter (HOSPITAL_COMMUNITY)
Admission: RE | Admit: 2020-06-08 | Discharge: 2020-06-08 | Disposition: A | Discharge status: Home / Self Care | Payer: Medicare Other | Source: Ambulatory Visit | Attending: Cardiology | Admitting: Cardiology

## 2020-06-08 DIAGNOSIS — Z952 Presence of prosthetic heart valve: Secondary | ICD-10-CM

## 2020-06-08 DIAGNOSIS — Z5189 Encounter for other specified aftercare: Secondary | ICD-10-CM | POA: Diagnosis not present

## 2020-06-11 ENCOUNTER — Other Ambulatory Visit: Payer: Self-pay

## 2020-06-11 ENCOUNTER — Encounter (HOSPITAL_COMMUNITY)
Admission: RE | Admit: 2020-06-11 | Discharge: 2020-06-11 | Disposition: A | Discharge status: Home / Self Care | Payer: Medicare Other | Source: Ambulatory Visit | Attending: Cardiology | Admitting: Cardiology

## 2020-06-11 DIAGNOSIS — Z952 Presence of prosthetic heart valve: Secondary | ICD-10-CM

## 2020-06-11 DIAGNOSIS — Z5189 Encounter for other specified aftercare: Secondary | ICD-10-CM | POA: Diagnosis not present

## 2020-06-13 ENCOUNTER — Encounter (HOSPITAL_COMMUNITY)
Admission: RE | Admit: 2020-06-13 | Discharge: 2020-06-13 | Disposition: A | Discharge status: Home / Self Care | Payer: Medicare Other | Source: Ambulatory Visit | Attending: Cardiology | Admitting: Cardiology

## 2020-06-13 ENCOUNTER — Other Ambulatory Visit: Payer: Self-pay

## 2020-06-13 DIAGNOSIS — Z952 Presence of prosthetic heart valve: Secondary | ICD-10-CM

## 2020-06-13 DIAGNOSIS — Z5189 Encounter for other specified aftercare: Secondary | ICD-10-CM | POA: Diagnosis not present

## 2020-06-15 ENCOUNTER — Other Ambulatory Visit: Payer: Self-pay

## 2020-06-15 ENCOUNTER — Encounter (HOSPITAL_COMMUNITY)
Admission: RE | Admit: 2020-06-15 | Discharge: 2020-06-15 | Disposition: A | Discharge status: Home / Self Care | Payer: Medicare Other | Source: Ambulatory Visit | Attending: Cardiology | Admitting: Cardiology

## 2020-06-15 DIAGNOSIS — Z952 Presence of prosthetic heart valve: Secondary | ICD-10-CM

## 2020-06-15 DIAGNOSIS — Z5189 Encounter for other specified aftercare: Secondary | ICD-10-CM | POA: Diagnosis not present

## 2020-06-18 ENCOUNTER — Encounter (HOSPITAL_COMMUNITY)
Admission: RE | Admit: 2020-06-18 | Discharge: 2020-06-18 | Disposition: A | Discharge status: Home / Self Care | Payer: Medicare Other | Source: Ambulatory Visit | Attending: Cardiology | Admitting: Cardiology

## 2020-06-18 ENCOUNTER — Other Ambulatory Visit: Payer: Self-pay

## 2020-06-18 DIAGNOSIS — Z952 Presence of prosthetic heart valve: Secondary | ICD-10-CM

## 2020-06-18 DIAGNOSIS — Z5189 Encounter for other specified aftercare: Secondary | ICD-10-CM | POA: Diagnosis not present

## 2020-06-20 ENCOUNTER — Other Ambulatory Visit: Payer: Self-pay

## 2020-06-20 ENCOUNTER — Encounter (HOSPITAL_COMMUNITY)
Admission: RE | Admit: 2020-06-20 | Discharge: 2020-06-20 | Disposition: A | Discharge status: Home / Self Care | Payer: Medicare Other | Source: Ambulatory Visit | Attending: Cardiology | Admitting: Cardiology

## 2020-06-20 DIAGNOSIS — Z952 Presence of prosthetic heart valve: Secondary | ICD-10-CM

## 2020-06-20 DIAGNOSIS — Z5189 Encounter for other specified aftercare: Secondary | ICD-10-CM | POA: Diagnosis not present

## 2020-06-20 NOTE — Progress Notes (Signed)
Discharge Progress Report  Patient Details  Name: Cheryl Hernandez MRN: [id number] Date of Birth: 1941/02/17 Referring Provider:   Flowsheet Row CARDIAC REHAB PHASE II ORIENTATION from 04/26/2020 in [place]  Referring Provider Gwyndolyn Kaufman, MD       Number of Visits: 17  Reason for Discharge:  Patient reached a stable level of exercise.  Smoking History:  Social History   Tobacco Use  Smoking Status Former Smoker  . Packs/day: 1.00  . Years: 30.00  . Pack years: 30.00  . Types: Cigarettes  . Quit date: 03/31/1998  . Years since quitting: 22.2  Smokeless Tobacco Never Used  Tobacco Comment   Quit intermittently since her 42s.    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  ADL [facility]:   Initial Exercise Prescription:  Initial Exercise Prescription - 04/26/20 1500      Date of Initial Exercise RX and Referring Provider   Date 04/26/20    Referring Provider Gwyndolyn Kaufman, MD    Expected Discharge Date 06/22/20      NuStep   Level 1    SPM 80    Minutes 30    METs 1.7      Prescription Details   Frequency (times per week) 3    Duration Progress to 30 minutes of continuous aerobic without signs/symptoms of physical distress      Intensity   THRR 40-80% of Max Heartrate 56-111    Ratings of Perceived Exertion 11-13    Perceived Dyspnea 0-4      Progression   Progression Continue progressive overload as per policy without signs/symptoms or physical distress.      Resistance Training   Training Prescription Yes    Weight 2    Reps 10-15           Discharge Exercise Prescription (Final Exercise Prescription Changes):  Exercise Prescription Changes - 06/22/20 1520      Response to Exercise   Blood Pressure (Admit) 148/62    Blood Pressure (Exercise) 138/62    Blood Pressure (Exit) 130/60    Heart Rate (Admit) 99 bpm    Heart Rate (Exercise) 124 bpm    Heart Rate (Exit) 110 bpm     Rating of Perceived Exertion (Exercise) 11    Symptoms None    Comments Pt's last day in the CRP2 program    Duration Continue with 30 min of aerobic exercise without signs/symptoms of physical distress.    Intensity THRR unchanged      Progression   Progression Continue to progress workloads to maintain intensity without signs/symptoms of physical distress.    Average METs 1.9      Resistance Training   Training Prescription Yes    Weight 2 lbs    Reps 10-15    Time 10 Minutes      Interval Training   Interval Training No      NuStep   Level 1    SPM 85    Minutes 15    METs 1.9      Track   Laps 7    Minutes 15    METs 1.93      Home Exercise Plan   Plans to continue exercise at Home (comment)    Frequency Add 2 additional days to program exercise sessions.    Initial Home Exercises Provided 05/30/20           Functional Capacity:  6 Minute Walk  [place] Name 04/26/20 1355 06/15/20 1320       6 Minute Walk   Phase Initial Discharge    Distance 780 feet 1087 feet    Distance % Change -- 39.36 %    Distance Feet Change -- 307 ft    Walk Time 6 minutes 6 minutes    # of Rest Breaks 1  pt sat from 3.07-5.10 0    MPH 1.5 2.1    METS 1.7 2.32    RPE 13 11    Perceived Dyspnea  3 2    VO2 Peak 6.09 8.1    Symptoms -- Yes (comment)    Comments -- SOB, RPD = 2    Resting HR 87 bpm 98 bpm    Resting BP 128/60 144/60    Resting Oxygen Saturation  97 % 96 %    Exercise Oxygen Saturation  during 6 min walk 95 % 95 %    Max Ex. HR 124 bpm 118 bpm    Max Ex. BP 140/70 152/64    2 Minute Post BP 128/66 --           Psychological, QOL, Others - Outcomes: PHQ 2/9: Depression screen [facility] 2/9 07/09/2020 04/27/2020  Decreased Interest 0 0  Down, Depressed, Hopeless 0 0  PHQ - 2 Score 0 0  Some recent data might be hidden    Quality of Life:  Quality of Life - 06/20/20 1430      Quality of Life   Select Quality of Life      Quality of Life Scores    Health/Function Post 24.17 %    Socioeconomic Post 25.8 %    Psych/Spiritual Post 23.21 %    Family Post 28.5 %    GLOBAL Post 24.77 %           Personal Goals: Goals established at orientation with interventions provided to work toward goal.  Personal Goals and Risk Factors at Admission - 04/26/20 1606      Core Components/Risk Factors/Patient Goals on Admission    Weight Management Weight Loss;Yes;Weight Maintenance    Intervention Weight Management: Develop a combined nutrition and exercise program designed to reach desired caloric intake, while maintaining appropriate intake of nutrient and fiber, sodium and fats, and appropriate energy expenditure required for the weight goal.;Weight Management: Provide education and appropriate resources to help participant work on and attain dietary goals.;Weight Management/Obesity: Establish reasonable short term and long term weight goals.    Admit Weight 143 lb 8.3 oz (65.1 kg)    Improve shortness of breath with ADL's Yes    Intervention Provide education, individualized exercise plan and daily activity instruction to help decrease symptoms of SOB with activities of daily living.    Expected Outcomes Short Term: Improve cardiorespiratory fitness to achieve a reduction of symptoms when performing ADLs;Long Term: Be able to perform more ADLs without symptoms or delay the onset of symptoms    Diabetes Yes    Intervention Provide education about signs/symptoms and action to take for hypo/hyperglycemia.;Provide education about proper nutrition, including hydration, and aerobic/resistive exercise prescription along with prescribed medications to achieve blood glucose in normal ranges: Fasting glucose 65-99 mg/dL    Expected Outcomes Short Term: Participant verbalizes understanding of the signs/symptoms and immediate care of hyper/hypoglycemia, proper foot care and importance of medication, aerobic/resistive exercise and nutrition plan for blood glucose  control.;Long Term: Attainment of HbA1C < 7%.    Hypertension Yes    Intervention Provide education on lifestyle  modifcations including regular physical activity/exercise, weight management, moderate sodium restriction and increased consumption of fresh fruit, vegetables, and low fat dairy, alcohol moderation, and smoking cessation.;Monitor prescription use compliance.    Expected Outcomes Short Term: Continued assessment and intervention until BP is < 140/21m HG in hypertensive participants. < 130/857mHG in hypertensive participants with diabetes, heart failure or chronic kidney disease.;Long Term: Maintenance of blood pressure at goal levels.    Lipids Yes    Intervention Provide education and support for participant on nutrition & aerobic/resistive exercise along with prescribed medications to achieve LDL <7064mHDL >53m27m  Expected Outcomes Short Term: Participant states understanding of desired cholesterol values and is compliant with medications prescribed. Participant is following exercise prescription and nutrition guidelines.;Long Term: Cholesterol controlled with medications as prescribed, with individualized exercise RX and with personalized nutrition plan. Value goals: LDL < 70mg87mL > 40 mg.            Personal Goals Discharge:  Goals and Risk Factor Review    Row Name 05/02/20 1156 05/08/20 1459 05/29/20 1642 07/09/20 1039       Core Components/Risk Factors/Patient Goals Review   Personal Goals Review Weight Management/Obesity;Lipids;Hypertension;Diabetes Weight Management/Obesity;Lipids;Hypertension;Diabetes Weight Management/Obesity;Lipids;Hypertension;Diabetes Weight Management/Obesity;Lipids;Hypertension;Diabetes    Review Cheryl Chickted exercise on 04/30/20. Vital signs and CBG's stable. Tolerated exercise fair. Cheryl Chickattended 2 exercise sessions so far exercise is on hold following follow up with Dr PembeJohney Framenonsustained SVT Cheryl Chickbeen doing well with exercise  for her fitness level. No further SVT has been noted. CBG's and vital signs have been stable. Cheryl Chickbeen doing well with exercise for her fitness level. Cheryl Hernandez completed exercise on 06/22/20.    Expected Outcomes Cheryl Hernandez continue to participate in phase 2 cardiac rehab for exercise nutrition and lifestyle modifications Cheryl Hernandez continue to participate in phase 2 cardiac rehab for exercise nutrition and lifestyle modifications Cheryl Hernandez continue to participate in phase 2 cardiac rehab for exercise nutrition and lifestyle modifications Cheryl Hernandez continue to participate in phase 2 cardiac rehab for exercise nutrition and lifestyle modifications           Exercise Goals and Review:  Exercise Goals    Row Name 04/26/20 1556             Exercise Goals   Increase Physical Activity Yes       Expected Outcomes Short Term: Attend rehab on a regular basis to increase amount of physical activity.;Long Term: Add in home exercise to make exercise part of routine and to increase amount of physical activity.;Long Term: Exercising regularly at least 3-5 days a week.       Increase Strength and Stamina Yes       Intervention Provide advice, education, support and counseling about physical activity/exercise needs.;Develop an individualized exercise prescription for aerobic and resistive training based on initial evaluation findings, risk stratification, comorbidities and participant's personal goals.       Expected Outcomes Short Term: Increase workloads from initial exercise prescription for resistance, speed, and METs.;Short Term: Perform resistance training exercises routinely during rehab and add in resistance training at home;Long Term: Improve cardiorespiratory fitness, muscular endurance and strength as measured by increased METs and functional capacity (6MWT)       Able to understand and use rate of perceived exertion (RPE) scale Yes       Intervention Provide education and explanation on how to  use RPE scale       Expected Outcomes Short Term:  Able to use RPE daily in rehab to express subjective intensity level;Long Term:  Able to use RPE to guide intensity level when exercising independently       Able to understand and use Dyspnea scale Yes       Intervention Provide education and explanation on how to use Dyspnea scale       Expected Outcomes Short Term: Able to use Dyspnea scale daily in rehab to express subjective sense of shortness of breath during exertion;Long Term: Able to use Dyspnea scale to guide intensity level when exercising independently       Knowledge and understanding of Target Heart Rate Range (THRR) Yes       Intervention Provide education and explanation of THRR including how the numbers were predicted and where they are located for reference       Expected Outcomes Short Term: Able to state/look up THRR;Short Term: Able to use daily as guideline for intensity in rehab;Long Term: Able to use THRR to govern intensity when exercising independently       Understanding of Exercise Prescription Yes       Intervention Provide education, explanation, and written materials on patient's individual exercise prescription       Expected Outcomes Short Term: Able to explain program exercise prescription;Long Term: Able to explain home exercise prescription to exercise independently              Exercise Goals Re-Evaluation:  Exercise Goals Re-Evaluation    Row Name 04/30/20 1649 05/25/20 1500 05/30/20 1036 06/22/20 1523       Exercise Goal Re-Evaluation   Exercise Goals Review Increase Physical Activity;Increase Strength and Stamina;Able to understand and use rate of perceived exertion (RPE) scale;Knowledge and understanding of Target Heart Rate Range (THRR);Understanding of Exercise Prescription Increase Physical Activity;Increase Strength and Stamina;Able to understand and use rate of perceived exertion (RPE) scale;Knowledge and understanding of Target Heart Rate Range  (THRR);Understanding of Exercise Prescription Understanding of Exercise Prescription;Able to check pulse independently;Knowledge and understanding of Target Heart Rate Range (THRR);Able to understand and use rate of perceived exertion (RPE) scale;Increase Strength and Stamina;Increase Physical Activity Increase Physical Activity;Increase Strength and Stamina;Able to understand and use rate of perceived exertion (RPE) scale;Knowledge and understanding of Target Heart Rate Range (THRR);Able to check pulse independently;Understanding of Exercise Prescription    Comments Pt's first day of exercise in the CRP2 program. Pt understands the exercise RX, THRR, and RPE scale. Reviewed METs and goals. Pt is making slow progress due to absence with episode of SVT. Pt has good attendence and we will continue to porgress as tolerated. Reviewed home exercise Rx. Walking at home encouraged 2-3x/week. 2-3 sessions of 10-15 minutes discussed. Pt graduated from the [facility] program today with an average MET level of 1.9. Pt will continue her walking at home as well as doing floor exercises.    Expected Outcomes Will continue to montior patient and progress workloads as tolerated. Will continue to montior patient and progress workloads as tolerated. Pt will walk at home 2-3 x/week. Pt will continue to exercise at home on her own.           Nutrition & Weight - Outcomes:  Pre Biometrics - 04/26/20 1441      Pre Biometrics   Waist Circumference 37 inches    Hip Circumference 43.5 inches    Waist to Hip Ratio 0.85 %    Triceps Skinfold 28 mm    % Body Fat 39.7 %    Grip Strength 25  kg    Flexibility 14.25 in    Single Leg Stand 4 seconds            Nutrition:  Nutrition Therapy & Goals - 05/21/20 1358      Nutrition Therapy   Diet TLC      Personal Nutrition Goals   Nutrition Goal Pt to build a healthy plate including vegetables, fruits, whole grains, and low-fat dairy products in a heart healthy meal plan.     Personal Goal #2 Pt continue checking fasting CBGs daily      Intervention Plan   Intervention Prescribe, educate and counsel regarding individualized specific dietary modifications aiming towards targeted core components such as weight, hypertension, lipid management, diabetes, heart failure and other comorbidities.;Nutrition handout(s) given to patient.    Expected Outcomes Short Term Goal: Understand basic principles of dietary content, such as calories, fat, sodium, cholesterol and nutrients.           Nutrition Discharge:   Education Questionnaire Score:  Knowledge Questionnaire Score - 06/20/20 1430      Knowledge Questionnaire Score   Post Score 24/24           Goals reviewed with patient; copy given to patient.Cheryl Hernandez graduated from cardiac rehab program on 06/22/20 with completion of 17 exercise sessions in Phase II. Pt maintained good attendance and progressed nicely during her participation in rehab as evidenced by  A small increase in her MET level.   Medication list reconciled. Repeat  PHQ score- 0 .  Pt has made significant lifestyle changes and should be commended for her success. Pt feels she has achieved her goals during cardiac rehab.   Pt plans to continue exercise by walking as tolerated.Cheryl Hernandez increased her distance on her post exercise walk test by 307 feet. Cheryl Hernandez did not have any further SVT during exercise. We are proud of Cheryl Hernandez's progress.Barnet Pall, RN,BSN 07/09/2020 4:41 PM

## 2020-06-22 ENCOUNTER — Other Ambulatory Visit: Payer: Self-pay

## 2020-06-22 ENCOUNTER — Encounter (HOSPITAL_COMMUNITY)
Admission: RE | Admit: 2020-06-22 | Discharge: 2020-06-22 | Disposition: A | Discharge status: Home / Self Care | Payer: Medicare Other | Source: Ambulatory Visit | Attending: Cardiology | Admitting: Cardiology

## 2020-06-22 DIAGNOSIS — Z5189 Encounter for other specified aftercare: Secondary | ICD-10-CM | POA: Diagnosis not present

## 2020-06-22 DIAGNOSIS — Z952 Presence of prosthetic heart valve: Secondary | ICD-10-CM

## 2020-06-27 ENCOUNTER — Ambulatory Visit: Payer: Medicare Other | Admitting: Pulmonary Disease

## 2020-06-27 NOTE — Progress Notes (Addendum)
Cardiology Office Note:    Date:  06/28/2020   ID:  NAILANI FULL, DOB [birth year], MRN [id number]  PCP:  [name], MD   [place]  Cardiologist:  Sinclair Grooms, MD  Advanced Practice Provider:  No care team member to display Electrophysiologist:  None   Referring MD: [name], MD    History of Present Illness:    Cheryl Hernandez is a 80 y.o. 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  Patient has a history of NSTEMI s/p PCI to RCA in2009. She was followed by Dr. [name] and noteda 2-3 year hx ofprogressive exertional shortness of breath and fatigue.TTEs revealedpresence of aortic stenosis that has gradually increased in severity. Diagnosticcathwas performed by Dr. 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 Catheterization also confirmed the presence of aortic stenosis with peak to peak and mean transvalvular gradients measured 33 and 29 mmHg respectively, corresponding to aortic valve area calculated only 0.81 cm. Right-sided pressures were normal.Follow-upTTE 10/07/19 revealedparadoxical severe low-flow low gradient aortic stenosis.  She was evaluated by the multidisciplinary valve team and underwent successful TAVR with a10m Medtronic Evolut Pro +THV via the TF approach on 12/20/19. Post operativeTTEshowedEF70%, normally functioning TAVR with a mean gradient of10.539mg and noPVL. Mild-mod MR.Unfortunately, patientdeveloped a persistent headache and blurry vision s/p TAVR. 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 She was seen by neurology and underwent full  stroke work up. She was discharged on aspirin and plavix. Also had a Zio patchwhich showed no atrial fibrillation. In follow up she complained of persistent visual issues and hallucinations. She was referred to Dr. RoChauncy Passyith neuro psych.  On 05/02/20, patient had 19 beat run of SVT with HR 150-175 during cardiac rehab. Converted back to SR afterwards with HR 90s. Was supposed to see me on my DOD day, however, she cancelled due to inclement weather. She now presents today for follow-up.  Last seen in clinic 05/14/20 where she was doing overall well. She was having some daytime fatigue which she thought may have been related to her bystolic.   Patient states she is continuing to have shortness of breath on exertion. Planned to see Pulmonary in the next couple of weeks. No significant LE edema. Still is having some trouble sleeping at night and has been started on zolpidem. No orthopnea, PND, or palpitations. Continues to walk every other day and uses the glider. She paces herself and takes breaks as needed. She is concerned about her heart rate as every time it goes over 110, she feels poorly.   Past Medical History:  Diagnosis Date  . Arthritis   . Asthma   . CAD (coronary artery disease)    a. NSTEMI 2009 s/p DES to RCAx2.  . Carotid artery disease ([facility]   a. Mild-moderate - followed by [facility].  . Chronic bronchitis ([facility]   per patient  . Chronic diarrhea   . Diabetes mellitus without complication ([facility]  . Diverticulosis   . GERD (gastroesophageal reflux disease)   . HTN (hypertension)   . Hx of migraines    as a child  . Hyperlipidemia   . [facility]   [facility] "about  2011"  . Neuropathy    per patient in feet, legs, and some in hands  . Obesity   . Paroxysmal ventricular tachycardia ([place])   . Rhinitis   . Rotator cuff tear   . 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 Burt Knack and Dr. Roxy Manns   . Severe aortic stenosis     Past Surgical History:  Procedure Laterality Date  . ANGIOPLASTY    . CORONARY ANGIOPLASTY WITH STENT PLACEMENT    . RIGHT/LEFT HEART CATH AND CORONARY ANGIOGRAPHY N/A 08/08/2019   Procedure: RIGHT/LEFT HEART CATH AND CORONARY ANGIOGRAPHY;  Surgeon: Belva Crome, MD;  Location: [facility];  Service: Cardiovascular;  Laterality: N/A;  . TEE WITHOUT CARDIOVERSION N/A 12/20/2019   Procedure: TRANSESOPHAGEAL ECHOCARDIOGRAM (TEE);  Surgeon: Sherren Mocha, MD;  Location: [facility];  Service: Open Heart Surgery;  Laterality: N/A;  . TONSILLECTOMY    . TOTAL ABDOMINAL HYSTERECTOMY    . TOTAL HIP ARTHROPLASTY    . TRANSCATHETER AORTIC VALVE REPLACEMENT, TRANSFEMORAL N/A 12/20/2019   Procedure: TRANSCATHETER AORTIC VALVE REPLACEMENT, TRANSFEMORAL;  Surgeon: Sherren Mocha, MD;  Location: [facility];  Service: Open Heart Surgery;  Laterality: N/A;    Current Medications: Current Meds  Medication Sig  . acetaminophen (TYLENOL) 500 MG tablet Take 500 mg by mouth every 6 (six) hours as needed (for arthritis pain.).  Marland Kitchen albuterol (VENTOLIN HFA) 108 (90 Base) MCG/ACT inhaler Inhale 2 puffs into the lungs every 6 (six) hours as needed for wheezing or shortness of breath.  . Alpha-D-Galactosidase (BEANO PO) Take 1 tablet by mouth daily as needed (flatulence/abdominal bloating.).  Marland Kitchen aspirin EC 81 MG tablet Take 81 mg by mouth every evening.  . B Complex-C (B-COMPLEX WITH VITAMIN C) tablet Take 1 tablet by mouth daily.   . budesonide (ENTOCORT EC) 3 MG 24 hr capsule Take 2 capsules (6 mg total) by mouth 2 (two) times daily as needed (diarrhea).  . budesonide-formoterol (SYMBICORT) 80-4.5 MCG/ACT inhaler Inhale 2 puffs into the lungs 2 (two) times daily as needed (congestion).  . calcium carbonate (TUMS - DOSED IN MG ELEMENTAL CALCIUM) 500 MG chewable tablet Chew 1 tablet by mouth 3 (three) times daily as needed for  heartburn.   . carvedilol (COREG) 6.25 MG tablet Take 1 tablet (6.25 mg total) by mouth 2 (two) times daily with a meal.  . Cholecalciferol (VITAMIN D3) 75 MCG (3000 UT) TABS Take 3,000 Units by mouth every other day.  . clindamycin (CLEOCIN) 300 MG capsule Take 600 mg by mouth daily.  . clopidogrel (PLAVIX) 75 MG tablet Take 1 tablet (75 mg total) by mouth daily with breakfast.  . Evolocumab (REPATHA SURECLICK) 076 MG/ML SOAJ INJECT 1 PEN INTO THE SKIN EVERY 14 DAYS  . fexofenadine (ALLEGRA) 180 MG tablet Take 180 mg by mouth daily.  . Glucose Blood (FREESTYLE LITE TEST VI) Once daily  . guaifenesin (HUMIBID E) 400 MG TABS tablet Take 400 mg by mouth daily.  Marland Kitchen KRILL OIL PO Take 1 capsule by mouth daily.  Marland Kitchen losartan (COZAAR) 25 MG tablet Take 1 tablet (25 mg total) by mouth daily.  . Magnesium Citrate 125 MG CAPS Take 125 mg by mouth daily.  . metFORMIN (GLUCOPHAGE-XR) 500 MG 24 hr tablet Take 500 mg by mouth daily.  . Multiple Vitamins-Minerals (MULTIVITAMIN WITH MINERALS) tablet Take 1 tablet by mouth every other day.  In the morning  . mupirocin ointment (BACTROBAN) 2 % Apply to both great toes once daily until healed.  . nebivolol (BYSTOLIC) 5 MG tablet Take 0.5 tablets (2.5 mg total) by mouth daily.  . nitroGLYCERIN (NITROSTAT) 0.4 MG SL tablet Place 1 tablet (0.4 mg total) under the tongue every 5 (five) minutes as needed for chest pain.  . Probiotic Product (ALIGN) 4 MG CAPS Take 4 mg by mouth every other day.  . zolpidem (AMBIEN) 5 MG tablet Take 5 mg by mouth at bedtime as needed for sleep.  . [DISCONTINUED] carvedilol (COREG) 3.125 MG tablet Take 3.125 mg by mouth 2 (two) times daily with a meal.  . [DISCONTINUED] vitamin B-12 (CYANOCOBALAMIN) 1000 MCG tablet Take 1,000 mcg by mouth every other day.     Allergies:   Amlodipine, Cefuroxime axetil, Cephalexin, Codeine, Doxycycline, Erythromycin base, and Statins   Social History   Socioeconomic History  . Marital status: Widowed     Spouse name: Not on file  . Number of children: Not on file  . Years of education: Not on file  . Highest education level: Not on file  Occupational History  . Occupation: retired Therapist, sports  Tobacco Use  . Smoking status: Former Smoker    Packs/day: 1.00    Years: 30.00    Pack years: 30.00    Types: Cigarettes    Quit date: 03/31/1998    Years since quitting: 22.2  . Smokeless tobacco: Never Used  . Tobacco comment: Quit intermittently since her 64s.  Vaping Use  . Vaping Use: Never used  Substance and Sexual Activity  . Alcohol use: Yes    Alcohol/week: 0.0 standard drinks    Comment: Rare.  . Drug use: No  . Sexual activity: Not on file  Other Topics Concern  . Not on file  Social History Narrative   Originally from [place]. She has traveled to [place], [place], [place], & [place]. Previously worked as a Marine scientist. No known TB exposure. No pets currently. No bird, hot tube, or asbestos exposure. Enjoys playing bridge.    Social Determinants of Health   Financial Resource Strain: Not on file  Food Insecurity: Not on file  Transportation Needs: Not on file  Physical Activity: Not on file  Stress: Not on file  Social Connections: Not on file     Family History: The patient's family history includes Allergies in her mother; Heart attack in her father; Hyperlipidemia in her mother; Prostate cancer in her maternal grandfather. There is no history of Lung disease.  ROS:   Please see the history of present illness.    Review of Systems  Constitutional: Positive for malaise/fatigue. Negative for chills and fever.  HENT: Negative for hearing loss.   Eyes: Negative for blurred vision and redness.  Respiratory: Positive for shortness of breath.   Cardiovascular: Positive for palpitations. Negative for chest pain, orthopnea, claudication, leg swelling and PND.  Gastrointestinal: Negative for melena, nausea and vomiting.  Genitourinary: Negative for flank pain.  Musculoskeletal: Positive for joint pain  and myalgias.  Neurological: Negative for dizziness and loss of consciousness.  Endo/Heme/Allergies: Negative for polydipsia.  Psychiatric/Behavioral: Negative for substance abuse.    EKGs/Labs/Other Studies Reviewed:    The following studies were reviewed today: TAVR OPERATIVE NOTE   Date of Procedure:12/20/2019  Preoperative Diagnosis:Severe Aortic Stenosis   Postoperative Diagnosis:Same   Procedure:   Transcatheter Aortic Valve Replacement - PercutaneousRightTransfemoral Approach Medtronic CoreValve Evolut Pro (size 34m, serial # [id number]  Co-Surgeons:Clarence H. ORoxy Manns MD  and Sherren Mocha, MD  Anesthesiologist:Charlene Nyoka Cowden, MD  Echocardiographer:Mihai Croitoru, MD  Pre-operative Echo Findings: ? Severe aortic stenosis ? Normalleft ventricular systolic function  Post-operative Echo Findings: ? Noparavalvular leak ? Normalleft ventricular systolic function  _____________  Cath 08/08/19:  Moderate aortic stenosis with peak to peak gradient of 33 mmHg, mean gradient 29 mmHg, and calculated aortic valve area of 0.81 cm.  Normal pulmonary pressures with mean arterial pressure of 23 mmHg.  3 mmHg gradient across the mitral valve  Normal left ventricular systolic function with EF 65%.  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 Luminal irregularities are noted throughout the LAD territory.  Luminal irregularities noted throughout the circumflex.  The RCA is dominant, tortuous, and contains a patent mid to distal stent beyond several acute bends. Diffuse luminal irregularities are noted otherwise.  Scattered significant calcification is noted in the coronary arterial tree, the thoracic aorta, and mitral  annulus.  RECOMMENDATIONS:   There is discordance between aortic stenosis and coronary disease severity and symptoms. Dyspnea on exertion may be multifactorial.  LAD aneurysm is stable.  Consider referral to aortic valve clinic for an opinion.  May need pulmonary evaluation.  May need transesophageal echo to exclude the possibility of mitral valve disease being more significant than suggested by current data.   Echo9/22/21: IMPRESSIONS  1. A 3.5 m/s intracavitary "gradient" is present due to hyperdynamic left  ventricular contraction. Left ventricular ejection fraction, by  estimation, is 70 to 75%. The left ventricle has hyperdynamic function.  The left ventricle has no regional wall  motion abnormalities. Left ventricular diastolic parameters are consistent  with Grade II diastolic dysfunction (pseudonormalization). Elevated left  atrial pressure.  2. Right ventricular systolic function is normal. The right ventricular  size is normal. There is mildly elevated pulmonary artery systolic  pressure.  3. The mitral valve is normal in structure. Mild to moderate mitral valve  regurgitation. No evidence of mitral stenosis. Moderate mitral annular  calcification.  4. The aortic valve has been repaired/replaced. Aortic valve  regurgitation is not visualized. No aortic stenosis is present. There is a  CoreValve-Evolut Pro prosthetic (TAVR) valve present in the aortic  position. Procedure Date: 12/20/2019.  5. The inferior vena cava is normal in size with greater than 50%  respiratory variability, suggesting right atrial pressure of 3 mmHg.   __________________  [facility] AT 12/22/19 Study Highlights   Basic rhythm is NSR  Non Sustained SVT at rates 115 to 214 bpm. Longest 11 seconds  No atrial fibrillation  SVT burden < 1%  _________________  Echo 01/26/20 IMPRESSIONS  1. 26 mm Evolute Pro. Vmax 1.74 m/s, MG 7.2 mmHG, EOA 2.33 cm2, DI 0.74.  Mild  paravalvular leak is the 5-6 o'clock position. The aortic valve has  been repaired/replaced. Aortic valve regurgitation is mild. There is a 26  mm CoreValve-Evolut Pro  prosthetic (TAVR) valve present in the aortic position. Procedure Date:  12/20/2019.  2. Left ventricular ejection fraction, by estimation, is 65 to 70%. The  left ventricle has normal function. The left ventricle has no regional  wall motion abnormalities. Left ventricular diastolic function could not  be evaluated.  3. Right ventricular systolic function is normal. The right ventricular  size is normal. There is normal pulmonary artery systolic pressure. The  estimated right ventricular systolic pressure is 02.7 mmHg.  4. Left atrial size was mildly dilated.  5. Mild calcific mitral stenosis (  degnerative). MVA by VTI 2.2 cm2. The  mitral valve is degenerative. Mild mitral valve regurgitation. Mild mitral  stenosis. The mean mitral valve gradient is 6.6 mmHg with average heart  rate of 83 bpm. Moderate to severe  mitral annular calcification.  6. The inferior vena cava is normal in size with greater than 50%  respiratory variability, suggesting right atrial pressure of 3 mmHg.   Comparison(s): Changes from prior study are noted. Mild PVL is now  present.   EKG 06/28/20: NSR with HR 94  Recent Labs: 07/25/2019: NT-Pro BNP 183 12/16/2019: ALT 17; B Natriuretic Peptide 117.4 12/21/2019: Magnesium 1.6 12/22/2019: Hemoglobin 11.3; Platelets 207 03/02/2020: BUN 16; Creatinine, Ser 0.72; Potassium 4.6; Sodium 139  Recent Lipid Panel    Component Value Date/Time   CHOL 157 12/22/2019 0841   CHOL 150 07/04/2019 0749   TRIG 151 (H) 12/22/2019 0841   HDL 67 12/22/2019 0841   HDL 68 07/04/2019 0749   CHOLHDL 2.3 12/22/2019 0841   VLDL 30 12/22/2019 0841   LDLCALC 60 12/22/2019 0841   LDLCALC 64 07/04/2019 0749     Risk Assessment/Calculations:       Physical Exam:    VS:  BP (!) 140/58   Pulse 94   Ht _0   (1.6 m)   Wt 142 lb 12.8 oz (64.8 kg)   SpO2 98%   BMI 25.30 kg/m     Wt Readings from Last 3 Encounters:  06/28/20 142 lb 12.8 oz (64.8 kg)  05/14/20 143 lb (64.9 kg)  04/26/20 143 lb 8.3 oz (65.1 kg)     GEN:  Well nourished, well developed in no acute distress HEENT: Normal NECK: No JVD; No carotid bruits CARDIAC: RRR, no murmurs, rubs, gallops RESPIRATORY:  Clear to auscultation without rales, wheezing or rhonchi  ABDOMEN: Soft, non-tender, non-distended MUSCULOSKELETAL:  No edema; No deformity  SKIN: Warm and dry NEUROLOGIC:  Alert and oriented x 3 PSYCHIATRIC:  Normal affect   ASSESSMENT:    1. Coronary artery disease involving native coronary artery of native heart with angina pectoris ([place])   2. SVT (supraventricular tachycardia) ([place])   3. S/P TAVR (transcatheter aortic valve replacement)   4. Severe aortic stenosis   5. Essential hypertension   6. Other fatigue   7. 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 Bilateral carotid artery stenosis    PLAN:    In order of problems listed above:  #SVT: Patient with 19 beat episode of SVT on monitor while at cardiac rehab. Has known history of SVT. Recent monitor with no episodes of Afib but some runs of SVT. Telemetry strips appeared to be SVT with artifact. Cardiac monitor without significant arrhythmias.  -Recent 30day monitor with no sustained arrhythmias -Increase coreg to 6.96m BID  #Severe, paradoxical AS s/p TAVR 12/20/19: TTE with LVEF 65% with normal functioning TAVR with mean gradient 735mg and mild PVL. NYHA class II symptoms. Doing well.  -Follow-up with structural heart team as scheduled -Continue ASA and plavix -Will touch base with Dr. CoBurt Knackbout duration of plavix given that she has had recent stroke -SBE ppx prescribed  #Mild MS #Severe MAC: Ideally, would like to have her heart rate better controlled to improve diastolic filling. -Increase coreg to 6.2599mBID -Serial TTEs for monitoring  #Post-Op CVA: Improving with no significant residual symptoms. -Follow-up with neuro -Continue ASA and plavix as above -Will touch base with Dr. CooBurt Knackgarding duration of plavix given TAVR/stroke  #Carotid  Artery Disease: 25% stenosis bilaterally on CTA neck in 12/21/19 -Continue DAPT as above -On repatha  #CAD s/p RCA PCI in the past: No anginal symptoms. -Continue DAPT as above -Continue BB and ARB as above  #HTN: -Losartan 54m daily -Continue coreg 6.240mBID  #Fatigue: Likely multifactorial in the setting of lack of sleep and possible beta blocker use. Given need for BB as this time, will continue medication and follow symptoms. -Continue BB -Sleep management per PCP    Medication Adjustments/Labs and Tests Ordered: Current medicines are reviewed at length with the patient today.  Concerns regarding medicines are outlined above.  Orders Placed This Encounter  Procedures  . EKG 12-Lead   Meds ordered this encounter  Medications  . carvedilol (COREG) 6.25 MG tablet    Sig: Take 1 tablet (6.25 mg total) by mouth 2 (two) times daily with a meal.    Dispense:  180 tablet    Refill:  1    Dose increase    Patient Instructions  Medication Instructions:   INCREASE YOUR CARVEDILOL TO 6.25 MG BY MOUTH TWICE DAILY WITH MEALS  *If you need a refill on your cardiac medications before your next appointment, please call your pharmacy*    Follow-Up:  2 MONTHS IN THE OFFICE WITH DR. PEJohney Frame    Signed, HeFreada BergeronMD  06/28/2020 3:24 PM    [facility]

## 2020-06-28 ENCOUNTER — Ambulatory Visit: Payer: Medicare Other | Admitting: Cardiology

## 2020-06-28 ENCOUNTER — Other Ambulatory Visit: Payer: Self-pay

## 2020-06-28 ENCOUNTER — Encounter: Payer: Self-pay | Admitting: Cardiology

## 2020-06-28 VITALS — BP 140/58 | HR 94 | Ht 63.0 in | Wt 142.8 lb

## 2020-06-28 DIAGNOSIS — I471 Supraventricular tachycardia: Secondary | ICD-10-CM

## 2020-06-28 DIAGNOSIS — I25119 Atherosclerotic heart disease of native coronary artery with unspecified angina pectoris: Secondary | ICD-10-CM | POA: Diagnosis not present

## 2020-06-28 DIAGNOSIS — I10 Essential (primary) hypertension: Secondary | ICD-10-CM

## 2020-06-28 DIAGNOSIS — Z952 Presence of prosthetic heart valve: Secondary | ICD-10-CM | POA: Diagnosis not present

## 2020-06-28 DIAGNOSIS — I6523 Occlusion and stenosis of bilateral carotid arteries: Secondary | ICD-10-CM

## 2020-06-28 DIAGNOSIS — R5383 Other fatigue: Secondary | ICD-10-CM

## 2020-06-28 DIAGNOSIS — I350 Nonrheumatic aortic (valve) stenosis: Secondary | ICD-10-CM | POA: Diagnosis not present

## 2020-06-28 DIAGNOSIS — I6310 Cerebral infarction due to embolism of unspecified precerebral artery: Secondary | ICD-10-CM

## 2020-06-28 MED ORDER — CARVEDILOL 6.25 MG PO TABS: 6.2500 mg | ORAL_TABLET | Freq: Two times a day (BID) | ORAL | 1 refills | Status: DC

## 2020-06-28 NOTE — Patient Instructions (Signed)
Medication Instructions:   INCREASE YOUR CARVEDILOL TO 6.25 MG BY MOUTH TWICE DAILY WITH MEALS  *If you need a refill on your cardiac medications before your next appointment, please call your pharmacy*    Follow-Up:  2 MONTHS IN THE OFFICE WITH DR. Johney Frame

## 2020-07-03 ENCOUNTER — Other Ambulatory Visit: Payer: Self-pay

## 2020-07-03 ENCOUNTER — Encounter: Payer: Self-pay | Admitting: Pulmonary Disease

## 2020-07-03 ENCOUNTER — Ambulatory Visit: Payer: Medicare Other | Admitting: Pulmonary Disease

## 2020-07-03 DIAGNOSIS — J4540 Moderate persistent asthma, uncomplicated: Secondary | ICD-10-CM | POA: Diagnosis not present

## 2020-07-03 DIAGNOSIS — I479 Paroxysmal tachycardia, unspecified: Secondary | ICD-10-CM | POA: Diagnosis not present

## 2020-07-03 MED ORDER — BUDESONIDE-FORMOTEROL FUMARATE 80-4.5 MCG/ACT IN AERO: 2.0000 | INHALATION_SPRAY | Freq: Two times a day (BID) | RESPIRATORY_TRACT | 5 refills | Status: DC | PRN

## 2020-07-03 NOTE — Assessment & Plan Note (Signed)
Coreg is being titrated and we will see if her asthma symptoms get worse, she will contact us should that occur

## 2020-07-03 NOTE — Assessment & Plan Note (Signed)
It does seem that she needs twice daily Symbicort.  Okay to decrease dose to 1 puff twice a day if she is feeling well. Hardly needs albuterol for rescue -so cannot describe increased heart rate to this.

## 2020-07-03 NOTE — Patient Instructions (Signed)
Refills on symbicort  X 5

## 2020-07-03 NOTE — Progress Notes (Signed)
   Subjective:    Patient ID: Cheryl Hernandez, female    DOB: [birth year], 80 y.o.   MRN: [id number]  HPI  80 yo remote smoker  for follow-up of moderate persistent asthma and chronic allergic rhinitis PMH - TAVR   Chief Complaint  Patient presents with  . Follow-up    Nasal congestion, doing ok some cough from congestion   APP visit 07/2019 pred taper She underwent TAVR in 2021, unfortunately shortness of breath has persisted.  Heart rate is high especially on exertion.  She was transitioned from Bystolic to Coreg 7.04 twice daily and is tolerating this well. She will occasionally decrease Symbicort to 1 puff twice daily, if she misses a dose, she develops chest tightness  Chest x-ray 11/2019 suggested interstitial markings but CT angiogram chest was reviewed 10/2019 which does not show any evidence of ILD  Significant tests/ events reviewed CT angiogram chest 10/2019 no evidence of ILD  PFT 08/2015: FVC 2.24 L (85%) FEV1 1.82 L (93%) ratio 81 no bronchodilator response TLC 4.49 L (91%) RV 90% ERV 27% DLCO uncorrected 81% (hemoglobin 13.6)   11/2014:FVC 3.52 L (130%) FEV1 2.81 L (139%) FEV1/FVC 0.80   09/13/10:FVC 2.49 L (87%) FEV1 1.92 L (89%) FEV1/FVC 0.77 Review of Systems neg for any significant sore throat, dysphagia, itching, sneezing, nasal congestion or excess/ purulent secretions, fever, chills, sweats, unintended wt loss, pleuritic or exertional cp, hempoptysis, orthopnea pnd or change in chronic leg swelling. Also denies presyncope, palpitations, heartburn, abdominal pain, nausea, vomiting, diarrhea or change in bowel or urinary habits, dysuria,hematuria, rash, arthralgias, visual complaints, headache, numbness weakness or ataxia.     Objective:   Physical Exam  Gen. Pleasant, elderly, well-nourished, in no distress ENT - no thrush, no pallor/icterus,no post nasal drip Neck: No JVD, no thyromegaly, no carotid bruits Lungs: no use of accessory muscles, no  dullness to percussion, clear without rales or rhonchi  Cardiovascular: Rhythm regular, heart sounds  normal, no murmurs or gallops, no peripheral edema Musculoskeletal: No deformities, no cyanosis or clubbing        Assessment & Plan:

## 2020-08-22 ENCOUNTER — Encounter: Payer: Self-pay | Admitting: Neurology

## 2020-08-26 NOTE — Progress Notes (Deleted)
Cardiology Office Note:    Date:  08/26/2020   ID:  Cheryl, Hernandez 02-06-1941, MRN [id number]  PCP:  [name], MD   [place]  Cardiologist:  Sinclair Grooms, MD  Advanced Practice Provider:  No care team member to display Electrophysiologist:  None   Referring MD: [name], MD    History of Present Illness:    Cheryl Hernandez is a 80 y.o. 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  Patient has a history of NSTEMI s/p PCI to RCA in2009. She was followed by Dr. [name] and noteda 2-3 year hx ofprogressive exertional shortness of breath and fatigue.TTEs revealedpresence of aortic stenosis that has gradually increased in severity. Diagnosticcathwas performed by Dr. 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 Catheterization also confirmed the presence of aortic stenosis with peak to peak and mean transvalvular gradients measured 33 and 29 mmHg respectively, corresponding to aortic valve area calculated only 0.81 cm. Right-sided pressures were normal.Follow-upTTE 10/07/19 revealedparadoxical severe low-flow low gradient aortic stenosis.  She was evaluated by the multidisciplinary valve team and underwent successful TAVR with a39m Medtronic Evolut Pro +THV via the TF approach on 12/20/19. Post operativeTTEshowedEF70%, normally functioning TAVR with a mean gradient of10.529mg and noPVL. Mild-mod MR.Unfortunately, patientdeveloped a persistent headache and blurry vision s/p TAVR. 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 She was seen by neurology and underwent full  stroke work up. She was discharged on aspirin and plavix. Also had a Zio patchwhich showed no atrial fibrillation. In follow up she complained of persistent visual issues and hallucinations. She was referred to Dr. RoChauncy Passyith neuro psych.  On 05/02/20, patient had 19 beat run of SVT with HR 150-175 during cardiac rehab. Converted back to SR afterwards with HR 90s. Seen on 05/14/20 where she was doing overall well. She was having some daytime fatigue which she thought may have been related to her bystolic. We changed her to coreg. During our last visit on 06/28/20, we increased her coreg due to palpitations and rapid HR which were bothersome to her.  Today,   Past Medical History:  Diagnosis Date  . Arthritis   . Asthma   . CAD (coronary artery disease)    a. NSTEMI 2009 s/p DES to RCAx2.  . Carotid artery disease ([facility]   a. Mild-moderate - followed by [facility].  . Chronic bronchitis ([facility]   per patient  . Chronic diarrhea   . Diabetes mellitus without complication ([facility]  . Diverticulosis   . GERD (gastroesophageal reflux disease)   . HTN (hypertension)   . Hx of migraines    as a child  . Hyperlipidemia   . [facility] ([facility]   per patient "about 2011"  . Neuropathy    per patient in feet, legs, and some in hands  . Obesity   . Paroxysmal ventricular tachycardia ([facility]  . Rhinitis   . Rotator cuff tear   . 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 CoBurt Knacknd Dr. OwRoxy Manns . Severe aortic stenosis     Past  Surgical History:  Procedure Laterality Date  . ANGIOPLASTY    . CORONARY ANGIOPLASTY WITH STENT PLACEMENT    . RIGHT/LEFT HEART CATH AND CORONARY ANGIOGRAPHY N/A 08/08/2019   Procedure: RIGHT/LEFT HEART CATH AND CORONARY ANGIOGRAPHY;  Surgeon: Belva Crome, MD;  Location: [place] [facility];  Service: Cardiovascular;  Laterality: N/A;  . TEE WITHOUT CARDIOVERSION  N/A 12/20/2019   Procedure: TRANSESOPHAGEAL ECHOCARDIOGRAM (TEE);  Surgeon: Sherren Mocha, MD;  Location: [place] [facility];  Service: Open Heart Surgery;  Laterality: N/A;  . TONSILLECTOMY    . TOTAL ABDOMINAL HYSTERECTOMY    . TOTAL HIP ARTHROPLASTY    . TRANSCATHETER AORTIC VALVE REPLACEMENT, TRANSFEMORAL N/A 12/20/2019   Procedure: TRANSCATHETER AORTIC VALVE REPLACEMENT, TRANSFEMORAL;  Surgeon: Sherren Mocha, MD;  Location: [place] [facility];  Service: Open Heart Surgery;  Laterality: N/A;    Current Medications: No outpatient medications have been marked as taking for the 08/30/20 encounter (Appointment) with Freada Bergeron, MD.     Allergies:   Amlodipine, Cefuroxime axetil, Cephalexin, Codeine, Doxycycline, Erythromycin base, and Statins   Social History   Socioeconomic History  . Marital status: Widowed    Spouse name: Not on file  . Number of children: Not on file  . Years of education: Not on file  . Highest education level: Not on file  Occupational History  . Occupation: retired Therapist, sports  Tobacco Use  . Smoking status: Former Smoker    Packs/day: 1.00    Years: 30.00    Pack years: 30.00    Types: Cigarettes    Quit date: 03/31/1998    Years since quitting: 22.4  . Smokeless tobacco: Never Used  . Tobacco comment: Quit intermittently since her 99s.  Vaping Use  . Vaping Use: Never used  Substance and Sexual Activity  . Alcohol use: Yes    Alcohol/week: 0.0 standard drinks    Comment: Rare.  . Drug use: No  . Sexual activity: Not on file  Other Topics Concern  . Not on file  Social History Narrative   Originally from [place]. She has traveled to [place], [place], [place], & [place]. Previously worked as a Marine scientist. No known TB exposure. No pets currently. No bird, hot tube, or asbestos exposure. Enjoys playing bridge.    Social Determinants of Health   Financial Resource Strain: Not on file  Food Insecurity: Not on file  Transportation Needs: Not on file  Physical  Activity: Not on file  Stress: Not on file  Social Connections: Not on file     Family History: The patient's family history includes Allergies in her mother; Heart attack in her father; Hyperlipidemia in her mother; Prostate cancer in her maternal grandfather. There is no history of Lung disease.  ROS:   Please see the history of present illness.    Review of Systems  Constitutional: Positive for malaise/fatigue. Negative for chills and fever.  HENT: Negative for hearing loss.   Eyes: Negative for blurred vision and redness.  Respiratory: Positive for shortness of breath.   Cardiovascular: Positive for palpitations. Negative for chest pain, orthopnea, claudication, leg swelling and PND.  Gastrointestinal: Negative for melena, nausea and vomiting.  Genitourinary: Negative for flank pain.  Musculoskeletal: Positive for joint pain and myalgias.  Neurological: Negative for dizziness and loss of consciousness.  Endo/Heme/Allergies: Negative for polydipsia.  Psychiatric/Behavioral: Negative for substance abuse.    EKGs/Labs/Other Studies Reviewed:    The following studies were reviewed today: TAVR OPERATIVE NOTE   Date of Procedure:12/20/2019  Preoperative Diagnosis:Severe Aortic Stenosis   Postoperative Diagnosis:Same   Procedure:   Transcatheter Aortic Valve Replacement - PercutaneousRightTransfemoral Approach Medtronic CoreValve Evolut Pro (size 42m, serial # [id number]  Co-Surgeons:Clarence H. ORoxy Manns MD and MSherren Mocha MD  Anesthesiologist:Charlene GNyoka Cowden MD  Echocardiographer:Mihai Croitoru, MD  Pre-operative Echo Findings: ? Severe aortic stenosis ? Normalleft ventricular systolic function  Post-operative Echo Findings: ? Noparavalvular leak ? Normalleft ventricular systolic function  _____________  Cath 08/08/19:  Moderate  aortic stenosis with peak to peak gradient of 33 mmHg, mean gradient 29 mmHg, and calculated aortic valve area of 0.81 cm.  Normal pulmonary pressures with mean arterial pressure of 23 mmHg.  3 mmHg gradient across the mitral valve  Normal left ventricular systolic function with EF 65%.  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 Luminal irregularities are noted throughout the LAD territory.  Luminal irregularities noted throughout the circumflex.  The RCA is dominant, tortuous, and contains a patent mid to distal stent beyond several acute bends. Diffuse luminal irregularities are noted otherwise.  Scattered significant calcification is noted in the coronary arterial tree, the thoracic aorta, and mitral annulus.  RECOMMENDATIONS:   There is discordance between aortic stenosis and coronary disease severity and symptoms. Dyspnea on exertion may be multifactorial.  LAD aneurysm is stable.  Consider referral to aortic valve clinic for an opinion.  May need pulmonary evaluation.  May need transesophageal echo to exclude the possibility of mitral valve disease being more significant than suggested by current data.   Echo9/22/21: IMPRESSIONS  1. A 3.5 m/s intracavitary "gradient" is present due to hyperdynamic left  ventricular contraction. Left ventricular ejection fraction, by  estimation, is 70 to 75%. The left ventricle has hyperdynamic function.  The left ventricle has no regional wall  motion abnormalities. Left ventricular diastolic parameters are consistent  with Grade II diastolic dysfunction (pseudonormalization). Elevated left  atrial pressure.  2. Right ventricular systolic function is normal. The right ventricular  size is normal. There is mildly elevated pulmonary artery systolic  pressure.  3. The mitral valve is normal in structure. Mild to moderate  mitral valve  regurgitation. No evidence of mitral stenosis. Moderate mitral annular  calcification.  4. The aortic valve has been repaired/replaced. Aortic valve  regurgitation is not visualized. No aortic stenosis is present. There is a  CoreValve-Evolut Pro prosthetic (TAVR) valve present in the aortic  position. Procedure Date: 12/20/2019.  5. The inferior vena cava is normal in size with greater than 50%  respiratory variability, suggesting right atrial pressure of 3 mmHg.   __________________  ZIo AT 12/22/19 Study Highlights   Basic rhythm is NSR  Non Sustained SVT at rates 115 to 214 bpm. Longest 11 seconds  No atrial fibrillation  SVT burden < 1%  _________________  Echo 01/26/20 IMPRESSIONS  1. 26 mm Evolute Pro. Vmax 1.74 m/s, MG 7.2 mmHG, EOA 2.33 cm2, DI 0.74.  Mild paravalvular leak is the 5-6 o'clock position. The aortic valve has  been repaired/replaced. Aortic valve regurgitation is mild. There is a 26  mm CoreValve-Evolut Pro  prosthetic (TAVR) valve present in the aortic position. Procedure Date:  12/20/2019.  2. Left ventricular ejection fraction, by estimation, is 65 to 70%. The  left ventricle has normal function. The left ventricle has no regional  wall motion abnormalities. Left ventricular diastolic function could not  be evaluated.  3. Right ventricular systolic function is normal. The right ventricular  size is normal. There is normal pulmonary artery systolic pressure. The  estimated right ventricular systolic pressure is 25.0 mmHg.  4. Left atrial size was mildly dilated.  5. Mild calcific mitral stenosis (degnerative). MVA by VTI 2.2 cm2. The  mitral valve is degenerative. Mild mitral valve regurgitation. Mild mitral  stenosis. The mean mitral valve gradient is 6.6 mmHg with average heart  rate of 83 bpm. Moderate to severe  mitral annular calcification.  6. The inferior vena cava is normal in size with greater than 50%   respiratory variability, suggesting right atrial pressure of 3 mmHg.   Comparison(s): Changes from prior study are noted. Mild PVL is now  present.   EKG 06/28/20: NSR with HR 94  Recent Labs: 12/16/2019: ALT 17; B Natriuretic Peptide 117.4 12/21/2019: Magnesium 1.6 12/22/2019: Hemoglobin 11.3; Platelets 207 03/02/2020: BUN 16; Creatinine, Ser 0.72; Potassium 4.6; Sodium 139  Recent Lipid Panel    Component Value Date/Time   CHOL 157 12/22/2019 0841   CHOL 150 07/04/2019 0749   TRIG 151 (H) 12/22/2019 0841   HDL 67 12/22/2019 0841   HDL 68 07/04/2019 0749   CHOLHDL 2.3 12/22/2019 0841   VLDL 30 12/22/2019 0841   LDLCALC 60 12/22/2019 0841   LDLCALC 64 07/04/2019 0749     Risk Assessment/Calculations:       Physical Exam:    VS:  There were no vitals taken for this visit.    Wt Readings from Last 3 Encounters:  07/03/20 142 lb (64.4 kg)  06/28/20 142 lb 12.8 oz (64.8 kg)  05/14/20 143 lb (64.9 kg)     GEN:  Well nourished, well developed in no acute distress HEENT: Normal NECK: No JVD; No carotid bruits CARDIAC: RRR, no murmurs, rubs, gallops RESPIRATORY:  Clear to auscultation without rales, wheezing or rhonchi  ABDOMEN: Soft, non-tender, non-distended MUSCULOSKELETAL:  No edema; No deformity  SKIN: Warm and dry NEUROLOGIC:  Alert and oriented x 3 PSYCHIATRIC:  Normal affect   ASSESSMENT:    No diagnosis found. PLAN:    In order of problems listed above:  #SVT: Patient with 19 beat episode of SVT on monitor while at cardiac rehab. Has known history of SVT. Recent monitor with no episodes of Afib but some runs of SVT. Telemetry strips appeared to be SVT with artifact. Cardiac monitor without significant arrhythmias.  -Recent 30day monitor with no sustained arrhythmias -Continue coreg 6.68m BID  #Severe, paradoxical AS s/p TAVR 12/20/19: TTE with LVEF 65% with normal functioning TAVR with mean gradient 767mg and mild PVL. NYHA class II symptoms. Doing  well.  -Follow-up with structural heart team as scheduled -Continue ASA and plavix -Will touch base with Dr. CoBurt Knackbout duration of plavix given that she has had recent stroke -SBE ppx prescribed  #Mild MS #Severe MAC: Ideally, would like to have her heart rate better controlled to improve diastolic filling. -Continue coreg 6.2533mID -Serial TTEs for monitoring  #Post-Op CVA: Improving with no significant residual symptoms. -Follow-up with neuro -Continue ASA 45m67mily and plavix 75mg22mly as above -Will touch base with Dr. CoopeBurt Knackrding duration of plavix given TAVR/stroke  #Carotid Artery Disease: 25% stenosis bilaterally on CTA neck in 12/21/19 -Continue ASA 45mg 33my and plavix 75mg d52m as above -On repatha 140mg q217ms  #CAD s/p RCA PCI in the past: No anginal symptoms. -Continue ASA 45mg dai35mnd plavix 75mg dail60m above -Continue coreg 6.25mg BID -4minue losartan 25mg daily 42mN: -Losartan 25mg daily -43minue coreg 6.25mg30m  BID  #Fatigue: Likely multifactorial in the setting of lack of sleep and possible beta blocker use. Given need for BB as this time, will continue medication and follow symptoms. -Continue BB -Sleep management per PCP   Medication Adjustments/Labs and Tests Ordered: Current medicines are reviewed at length with the patient today.  Concerns regarding medicines are outlined above.  No orders of the defined types were placed in this encounter.  No orders of the defined types were placed in this encounter.   There are no Patient Instructions on file for this visit.   Signed, Freada Bergeron, MD  08/26/2020 3:16 PM    [facility]

## 2020-08-30 ENCOUNTER — Ambulatory Visit: Payer: Medicare Other | Admitting: Cardiology

## 2020-08-30 ENCOUNTER — Other Ambulatory Visit: Payer: Self-pay

## 2020-08-30 ENCOUNTER — Encounter: Payer: Self-pay | Admitting: Cardiology

## 2020-08-30 VITALS — BP 130/80 | HR 65 | Ht 63.0 in | Wt 137.8 lb

## 2020-08-30 DIAGNOSIS — Z952 Presence of prosthetic heart valve: Secondary | ICD-10-CM | POA: Diagnosis not present

## 2020-08-30 DIAGNOSIS — I350 Nonrheumatic aortic (valve) stenosis: Secondary | ICD-10-CM | POA: Diagnosis not present

## 2020-08-30 DIAGNOSIS — G453 Amaurosis fugax: Secondary | ICD-10-CM | POA: Diagnosis not present

## 2020-08-30 DIAGNOSIS — I471 Supraventricular tachycardia: Secondary | ICD-10-CM | POA: Diagnosis not present

## 2020-08-30 DIAGNOSIS — I050 Rheumatic mitral stenosis: Secondary | ICD-10-CM

## 2020-08-30 DIAGNOSIS — I10 Essential (primary) hypertension: Secondary | ICD-10-CM

## 2020-08-30 DIAGNOSIS — I6310 Cerebral infarction due to embolism of unspecified precerebral artery: Secondary | ICD-10-CM

## 2020-08-30 DIAGNOSIS — I25119 Atherosclerotic heart disease of native coronary artery with unspecified angina pectoris: Secondary | ICD-10-CM

## 2020-08-30 DIAGNOSIS — I2510 Atherosclerotic heart disease of native coronary artery without angina pectoris: Secondary | ICD-10-CM

## 2020-08-30 DIAGNOSIS — I6523 Occlusion and stenosis of bilateral carotid arteries: Secondary | ICD-10-CM

## 2020-08-30 MED ORDER — CLOPIDOGREL BISULFATE 75 MG PO TABS: 75.0000 mg | ORAL_TABLET | Freq: Every day | ORAL | 3 refills | Status: DC

## 2020-08-30 NOTE — Patient Instructions (Signed)
Medication Instructions:  Start Plavix 75 mg daily *If you need a refill on your cardiac medications before your next appointment, please call your pharmacy*   Lab Work: none If you have labs (blood work) drawn today and your tests are completely normal, you will receive your results only by: Marland Kitchen MyChart Message (if you have MyChart) OR . A paper copy in the mail If you have any lab test that is abnormal or we need to change your treatment, we will call you to review the results.   Testing/Procedures: Your physician has requested that you have an echocardiogram. Echocardiography is a painless test that uses sound waves to create images of your heart. It provides your doctor with information about the size and shape of your heart and how well your heart's chambers and valves are working. This procedure takes approximately one hour. There are no restrictions for this procedure.   Follow-Up: At [facility], you and your health needs are our priority.  As part of our continuing mission to provide you with exceptional heart care, we have created designated Provider Care Teams.  These Care Teams include your primary Cardiologist (physician) and Advanced Practice Providers (APPs -  Physician Assistants and Nurse Practitioners) who all work together to provide you with the care you need, when you need it.  We recommend signing up for the patient portal called "MyChart".  Sign up information is provided on this After Visit Summary.  MyChart is used to connect with patients for Virtual Visits (Telemedicine).  Patients are able to view lab/test results, encounter notes, upcoming appointments, etc.  Non-urgent messages can be sent to your provider as well.   To learn more about what you can do with MyChart, go to [email or web address].    Your next appointment:   3 month(s)  The format for your next appointment:   In Person  Provider:   You may see Dr. Johney Frame or one of the following Advanced  Practice Providers on your designated Care Team:    Richardson Dopp, PA-C  Robbie Lis, [place]    Other Instructions none

## 2020-08-30 NOTE — Progress Notes (Signed)
Cardiology Office Note:    Date:  08/30/2020   ID:  Cheryl Hernandez, DOB [birth year], MRN [id number]  PCP:  Cheryl Solian, MD   [place]  Cardiologist:  Sinclair Grooms, MD  Advanced Practice Provider:  No care team member to display Electrophysiologist:  None   Referring MD: Cheryl Solian, MD    History of Present Illness:    Cheryl Hernandez is a 80 y.o. 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  Patient has a history of NSTEMI s/p PCI to RCA in2009. She was followed by Dr. Tamala Hernandez and noteda 2-3 year hx ofprogressive exertional shortness of breath and fatigue.TTEs revealedpresence of aortic stenosis that has gradually increased in severity. Diagnosticcathwas performed by Dr. 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 Catheterization also confirmed the presence of aortic stenosis with peak to peak and mean transvalvular gradients measured 33 and 29 mmHg respectively, corresponding to aortic valve area calculated only 0.81 cm. Right-sided pressures were normal.Follow-upTTE 10/07/19 revealedparadoxical severe low-flow low gradient aortic stenosis.  She was evaluated by the multidisciplinary valve team and underwent successful TAVR with a51m Medtronic Evolut Pro +THV via the TF approach on 12/20/19. Post operativeTTEshowedEF70%, normally functioning TAVR with a mean gradient of10.531mg and noPVL. Mild-mod MR.Unfortunately, patientdeveloped a persistent headache and blurry vision s/p TAVR. 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 She was seen by neurology and underwent full  stroke work up. She was discharged on aspirin and plavix. Also had a Zio patchwhich showed no atrial fibrillation. In follow up she complained of persistent visual issues and hallucinations. She was referred to Dr. RoChauncy Passyith neuro psych.  On 05/02/20, patient had 19 beat run of SVT with HR 150-175 during cardiac rehab. Converted back to SR afterwards with HR 90s. Seen on 05/14/20 where she was doing overall well. She was having some daytime fatigue which she thought may have been related to her bystolic. We changed her to coreg. During our last visit on 06/28/20, we increased her coreg due to palpitations and rapid HR which were bothersome to her.  Today, the patient states she is concerned about changes in her vision that have developed since our last visit. Specifically, she has noticed increased "white floaters" in her vision that are more pervasive than those she experienced after her TAVR. Also had two episodes which she describes as "a shade coming down over her left eye". Each episode lasted a couple of hours before resolving. Did not go to the hospital at that time, but called her PCP who set her up to see Neurology (still waiting for her appointment) Notably, both episodes occurred after her plavix was stopped.    Otherwise, she feels well and feels stronger. Her fatigue has improved and she denies any palpitations. Tolerating the coreg well. She denies any chest pain, shortness of breath, or exertional symptoms. No headaches, lightheadedness, or syncope to report. Also has no lower extremity edema, orthopnea or PND.   Past Medical History:  Diagnosis Date  . Arthritis   . Asthma   . CAD (coronary artery disease)    a. NSTEMI 2009 s/p DES to RCAx2.  . [place]   a. Mild-moderate -  followed by [facility].  . Chronic bronchitis ([place])    per patient  . Chronic diarrhea   . Diabetes mellitus without complication ([place])   . Diverticulosis   . GERD (gastroesophageal  reflux disease)   . HTN (hypertension)   . Hx of migraines    as a child  . Hyperlipidemia   . Myocardial infarction [facility])    per patient "about 2011"  . Neuropathy    per patient in feet, legs, and some in hands  . Obesity   . Paroxysmal ventricular tachycardia ([place])   . Rhinitis   . Rotator cuff tear   . 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 Burt Hernandez and Dr. Roxy Hernandez   . Severe aortic stenosis     Past Surgical History:  Procedure Laterality Date  . ANGIOPLASTY    . CORONARY ANGIOPLASTY WITH STENT PLACEMENT    . RIGHT/LEFT HEART CATH AND CORONARY ANGIOGRAPHY N/A 08/08/2019   Procedure: RIGHT/LEFT HEART CATH AND CORONARY ANGIOGRAPHY;  Surgeon: Cheryl Crome, MD;  Location: [place] [facility];  Service: Cardiovascular;  Laterality: N/A;  . TEE WITHOUT CARDIOVERSION N/A 12/20/2019   Procedure: TRANSESOPHAGEAL ECHOCARDIOGRAM (TEE);  Surgeon: Cheryl Mocha, MD;  Location: [place] [facility];  Service: Open Heart Surgery;  Laterality: N/A;  . TONSILLECTOMY    . TOTAL ABDOMINAL HYSTERECTOMY    . TOTAL HIP ARTHROPLASTY    . TRANSCATHETER AORTIC VALVE REPLACEMENT, TRANSFEMORAL N/A 12/20/2019   Procedure: TRANSCATHETER AORTIC VALVE REPLACEMENT, TRANSFEMORAL;  Surgeon: Cheryl Mocha, MD;  Location: [place] [facility];  Service: Open Heart Surgery;  Laterality: N/A;    Current Medications: Current Meds  Medication Sig  . acetaminophen (TYLENOL) 500 MG tablet Take 500 mg by mouth every 6 (six) hours as needed (for arthritis pain.).  Marland Kitchen albuterol (VENTOLIN HFA) 108 (90 Base) MCG/ACT inhaler Inhale 2 puffs into the lungs every 6 (six) hours as needed for wheezing or shortness of breath.  . Alpha-D-Galactosidase (BEANO PO) Take 1 tablet by mouth daily as needed (flatulence/abdominal bloating.).  Marland Kitchen aspirin EC 81 MG tablet Take 81 mg by mouth every evening.  . B Complex-C (B-COMPLEX WITH VITAMIN C)  tablet Take 1 tablet by mouth daily.   . budesonide (ENTOCORT EC) 3 MG 24 hr capsule Take 2 capsules (6 mg total) by mouth 2 (two) times daily as needed (diarrhea).  . budesonide-formoterol (SYMBICORT) 80-4.5 MCG/ACT inhaler Inhale 2 puffs into the lungs 2 (two) times daily as needed (congestion).  . calcium carbonate (TUMS - DOSED IN MG ELEMENTAL CALCIUM) 500 MG chewable tablet Chew 1 tablet by mouth 3 (three) times daily as needed for heartburn.   . carvedilol (COREG) 6.25 MG tablet Take 1 tablet (6.25 mg total) by mouth 2 (two) times daily with a meal.  . Cholecalciferol (VITAMIN D3) 75 MCG (3000 UT) TABS Take 3,000 Units by mouth every other day.  . clopidogrel (PLAVIX) 75 MG tablet Take 1 tablet (75 mg total) by mouth daily.  . Evolocumab (REPATHA SURECLICK) 676 MG/ML SOAJ INJECT 1 PEN INTO THE SKIN EVERY 14 DAYS  . fexofenadine (ALLEGRA) 180 MG tablet Take 180 mg by mouth daily.  . Glucose Blood (FREESTYLE LITE TEST VI) Once daily  . guaifenesin (HUMIBID E) 400 MG TABS tablet Take 400 mg by mouth daily.  Marland Kitchen KRILL OIL PO Take 1 capsule by mouth daily.  Marland Kitchen losartan (COZAAR) 25 MG tablet  Take 1 tablet (25 mg total) by mouth daily.  . Magnesium Citrate 125 MG CAPS Take 125 mg by mouth daily.  . metFORMIN (GLUCOPHAGE-XR) 500 MG 24 hr tablet Take 500 mg by mouth daily.  . Multiple Vitamins-Minerals (MULTIVITAMIN WITH MINERALS) tablet Take 1 tablet by mouth every other day. In the morning  . mupirocin ointment (BACTROBAN) 2 % Apply to both great toes once daily until healed.  . nitroGLYCERIN (NITROSTAT) 0.4 MG SL tablet Place 1 tablet (0.4 mg total) under the tongue every 5 (five) minutes as needed for chest pain.  . Probiotic Product (ALIGN) 4 MG CAPS Take 4 mg by mouth every other day.  . Turmeric 400 MG CAPS Take by mouth daily.  Marland Kitchen zolpidem (AMBIEN) 5 MG tablet Take 5 mg by mouth at bedtime as needed for sleep.  . [DISCONTINUED] clopidogrel (PLAVIX) 75 MG tablet Take 1 tablet (75 mg total) by  mouth daily with breakfast.     Allergies:   Amlodipine, Cefuroxime axetil, Cephalexin, Codeine, Doxycycline, Erythromycin base, and Statins   Social History   Socioeconomic History  . Marital status: Widowed    Spouse name: Not on file  . Number of children: Not on file  . Years of education: Not on file  . Highest education level: Not on file  Occupational History  . Occupation: retired Therapist, sports  Tobacco Use  . Smoking status: Former Smoker    Packs/day: 1.00    Years: 30.00    Pack years: 30.00    Types: Cigarettes    Quit date: 03/31/1998    Years since quitting: 22.4  . Smokeless tobacco: Never Used  . Tobacco comment: Quit intermittently since her 55s.  Vaping Use  . Vaping Use: Never used  Substance and Sexual Activity  . Alcohol use: Yes    Alcohol/week: 0.0 standard drinks    Comment: Rare.  . Drug use: No  . Sexual activity: Not on file  Other Topics Concern  . Not on file  Social History Narrative   Originally from [place]. She has traveled to [place], [place], [place], & [place]. Previously worked as a Marine scientist. No known TB exposure. No pets currently. No bird, hot tube, or asbestos exposure. Enjoys playing bridge.    Social Determinants of Health   Financial Resource Strain: Not on file  Food Insecurity: Not on file  Transportation Needs: Not on file  Physical Activity: Not on file  Stress: Not on file  Social Connections: Not on file     Family History: The patient's family history includes Allergies in her mother; Heart attack in her father; Hyperlipidemia in her mother; Prostate cancer in her maternal grandfather. There is no history of Lung disease.  ROS:   Please see the history of present illness.    Review of Systems  Constitutional: Negative for chills, fever and malaise/fatigue.  HENT: Negative for hearing loss.   Eyes: Positive for blurred vision. Negative for redness.  Respiratory: Negative for shortness of breath.   Cardiovascular: Negative for chest pain,  palpitations, orthopnea, claudication, leg swelling and PND.  Gastrointestinal: Negative for melena, nausea and vomiting.  Genitourinary: Negative for flank pain.  Musculoskeletal: Negative for joint pain and myalgias.  Neurological: Negative for dizziness and loss of consciousness.  Endo/Heme/Allergies: Negative for polydipsia.  Psychiatric/Behavioral: Positive for hallucinations. Negative for substance abuse.    EKGs/Labs/Other Studies Reviewed:    The following studies were reviewed today: TAVR OPERATIVE NOTE   Date of Procedure:12/20/2019  Preoperative Diagnosis:Severe Aortic Stenosis  Postoperative Diagnosis:Same   Procedure:   Transcatheter Aortic Valve Replacement - PercutaneousRightTransfemoral Approach Medtronic CoreValve Evolut Pro (size 88m, serial # [id number]  Co-Surgeons:Clarence H. ORoxy Manns MD and MSherren Mocha MD  Anesthesiologist:Charlene GNyoka Cowden MD  Echocardiographer:Mihai Croitoru, MD  Pre-operative Echo Findings: ? Severe aortic stenosis ? Normalleft ventricular systolic function  Post-operative Echo Findings: ? Noparavalvular leak ? Normalleft ventricular systolic function  _____________  Cath 08/08/19:  Moderate aortic stenosis with peak to peak gradient of 33 mmHg, mean gradient 29 mmHg, and calculated aortic valve area of 0.81 cm.  Normal pulmonary pressures with mean arterial pressure of 23 mmHg.  3 mmHg gradient across the mitral valve  Normal left ventricular systolic function with EF 65%.  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 Luminal irregularities are noted throughout the LAD territory.  Luminal irregularities noted throughout the circumflex.  The RCA is dominant, tortuous, and contains a patent mid to  distal stent beyond several acute bends. Diffuse luminal irregularities are noted otherwise.  Scattered significant calcification is noted in the coronary arterial tree, the thoracic aorta, and mitral annulus.  RECOMMENDATIONS:   There is discordance between aortic stenosis and coronary disease severity and symptoms. Dyspnea on exertion may be multifactorial.  LAD aneurysm is stable.  Consider referral to aortic valve clinic for an opinion.  May need pulmonary evaluation.  May need transesophageal echo to exclude the possibility of mitral valve disease being more significant than suggested by current data.   Echo9/22/21: IMPRESSIONS  1. A 3.5 m/s intracavitary "gradient" is present due to hyperdynamic left  ventricular contraction. Left ventricular ejection fraction, by  estimation, is 70 to 75%. The left ventricle has hyperdynamic function.  The left ventricle has no regional wall  motion abnormalities. Left ventricular diastolic parameters are consistent  with Grade II diastolic dysfunction (pseudonormalization). Elevated left  atrial pressure.  2. Right ventricular systolic function is normal. The right ventricular  size is normal. There is mildly elevated pulmonary artery systolic  pressure.  3. The mitral valve is normal in structure. Mild to moderate mitral valve  regurgitation. No evidence of mitral stenosis. Moderate mitral annular  calcification.  4. The aortic valve has been repaired/replaced. Aortic valve  regurgitation is not visualized. No aortic stenosis is present. There is a  CoreValve-Evolut Pro prosthetic (TAVR) valve present in the aortic  position. Procedure Date: 12/20/2019.  5. The inferior vena cava is normal in size with greater than 50%  respiratory variability, suggesting right atrial pressure of 3 mmHg.   __________________  ZIo AT 12/22/19 Study Highlights   Basic rhythm is NSR  Non Sustained SVT at rates 115 to 214 bpm.  Longest 11 seconds  No atrial fibrillation  SVT burden < 1%  _________________  Echo 01/26/20 IMPRESSIONS  1. 26 mm Evolute Pro. Vmax 1.74 m/s, MG 7.2 mmHG, EOA 2.33 cm2, DI 0.74.  Mild paravalvular leak is the 5-6 o'clock position. The aortic valve has  been repaired/replaced. Aortic valve regurgitation is mild. There is a 26  mm CoreValve-Evolut Pro  prosthetic (TAVR) valve present in the aortic position. Procedure Date:  12/20/2019.  2. Left ventricular ejection fraction, by estimation, is 65 to 70%. The  left ventricle has normal function. The left ventricle has no regional  wall motion abnormalities. Left ventricular diastolic function could not  be evaluated.  3. Right ventricular systolic function is normal. The right ventricular  size is normal. There is normal  pulmonary artery systolic pressure. The  estimated right ventricular systolic pressure is 02.5 mmHg.  4. Left atrial size was mildly dilated.  5. Mild calcific mitral stenosis (degnerative). MVA by VTI 2.2 cm2. The  mitral valve is degenerative. Mild mitral valve regurgitation. Mild mitral  stenosis. The mean mitral valve gradient is 6.6 mmHg with average heart  rate of 83 bpm. Moderate to severe  mitral annular calcification.  6. The inferior vena cava is normal in size with greater than 50%  respiratory variability, suggesting right atrial pressure of 3 mmHg.   Comparison(s): Changes from prior study are noted. Mild PVL is now  present.   EKG: 08/30/2020: EKG is not ordered today. 06/28/20: NSR with HR 94  Recent Labs: 12/16/2019: ALT 17; B Natriuretic Peptide 117.4 12/21/2019: Magnesium 1.6 12/22/2019: Hemoglobin 11.3; Platelets 207 03/02/2020: BUN 16; Creatinine, Ser 0.72; Potassium 4.6; Sodium 139  Recent Lipid Panel    Component Value Date/Time   CHOL 157 12/22/2019 0841   CHOL 150 07/04/2019 0749   TRIG 151 (H) 12/22/2019 0841   HDL 67 12/22/2019 0841   HDL 68 07/04/2019 0749   CHOLHDL 2.3  12/22/2019 0841   VLDL 30 12/22/2019 0841   LDLCALC 60 12/22/2019 0841   LDLCALC 64 07/04/2019 0749     Risk Assessment/Calculations:       Physical Exam:    VS:  BP 130/80   Pulse 65   Ht _0  (1.6 m)   Wt 137 lb 12.8 oz (62.5 kg)   SpO2 96%   BMI 24.41 kg/m     Wt Readings from Last 3 Encounters:  08/30/20 137 lb 12.8 oz (62.5 kg)  07/03/20 142 lb (64.4 kg)  06/28/20 142 lb 12.8 oz (64.8 kg)     GEN:  Well nourished, well developed in no acute distress HEENT: Normal NECK: No JVD; No carotid bruits CARDIAC: RRR, 2/6 early-peaking systolic murmur, no rubs, no gallops RESPIRATORY:  Clear to auscultation without rales, wheezing or rhonchi  ABDOMEN: Soft, non-tender, non-distended MUSCULOSKELETAL:  No edema; No deformity  SKIN: Warm and dry NEUROLOGIC:  Alert and oriented x 3 PSYCHIATRIC:  Normal affect   ASSESSMENT:    1. Amaurosis fugax of left eye   2. SVT (supraventricular tachycardia) ([place])   3. Severe aortic stenosis   4. S/P TAVR (transcatheter aortic valve replacement)   5. Mitral valve stenosis, unspecified etiology   6. 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 Bilateral carotid artery stenosis   8. Coronary artery disease involving native coronary artery of native heart without angina pectoris   9. Essential hypertension   10. Coronary artery disease involving native coronary artery of native heart with angina pectoris ([place])    PLAN:    In order of problems listed above:  #Concern for Amaurosis Fugax: Patient with 2 episodes of the sensation of a "shade coming over her left eye" that lasted for several hours before resolving. Symptoms highly concerning for amaurosis fugax. Multiple monitors with no evidence of Afib, however, patient has frequent palpitations. TAVR valve with normal gradients on last echo in 12/2019. Given concern for amaurosis fugax, however, will refer to EP for consideration of loop recorder as well as  repeat TTE to reassess the TAVR valve. In the interim, I will resume plavix in addition to ASA. -Refer to EP for consideration of loop -Check TTE to reassess TAVR valve and gradients to ensure no thrombus -Resume plavix 44m daily for now  -Continue  ASA 10m daily -Continue repatha 1482mq2weeks  #SVT: Patient with 19 beat episode of SVT on monitor while at cardiac rehab. Has known history of SVT. Recent monitor with no episodes of Afib but some runs of SVT. Telemetry strips appeared to be SVT with artifact. Cardiac monitor without significant arrhythmias.  -Recent 30day monitor with no sustained arrhythmias -Continue coreg 6.2577mID -Will refer for loop as above  #Severe, paradoxical AS s/p TAVR 12/20/19: TTE with LVEF 65% with normal functioning TAVR with mean gradient 7mm73mand mild PVL. NYHA class II symptoms. Doing well.  -Follow-up with structural heart team as scheduled -Continue ASA 81mg80mly -Resume plavix 75mg 49my as above -SBE ppx prescribed  #Mild MS #Severe MAC: -Continue coreg 6.25mg B55mSerial TTEs for monitoring  #Post-Op CVA: Now with episode of floaters and concern for amaurosis fugax as above -Will refer to neuro -Continue ASA 81mg da55mand plavix 75mg dai43ms above -Loop recorder as above  #Carotid Artery Disease: 25% stenosis bilaterally on CTA neck in 12/21/19 -Continue ASA 81mg dail64md plavix 75mg daily67mabove -On repatha 140mg q2week17mCAD s/p RCA PCI in the past: No anginal symptoms. -Continue ASA 81mg daily a61mlavix 75mg daily as55mve -Continue coreg 6.25mg BID -Cont62m losartan 25mg daily  #HT73mLosartan 25mg daily -Cont10m coreg 6.25mg BID  #Floate29mPossibly related to post-TAVR stroke vs primary ophthalmologic issue. -Manage amaurosis fugax as above -Referred to Neuro as above -Patient will call her ophthalmologist   Follow-up in 3 months.  Medication Adjustments/Labs and Tests Ordered: Current medicines are  reviewed at length with the patient today.  Concerns regarding medicines are outlined above.  Orders Placed This Encounter  Procedures  . Ambulatory referral to Neurology  . Ambulatory referral to Cardiac Electrophysiology  . ECHOCARDIOGRAM COMPLETE   Meds ordered this encounter  Medications  . clopidogrel (PLAVIX) 75 MG tablet    Sig: Take 1 tablet (75 mg total) by mouth daily.    Dispense:  90 tablet    Refill:  3    Patient Instructions  Medication Instructions:  Start Plavix 75 mg daily *If you need a refill on your cardiac medications before your next appointment, please call your pharmacy*   Lab Work: none If you have labs (blood work) drawn today and your tests are completely normal, you will receive your results only by: . MyChart Message Marland Kitchenif you have MyChart) OR . A paper copy in the mail If you have any lab test that is abnormal or we need to change your treatment, we will call you to review the results.   Testing/Procedures: Your physician has requested that you have an echocardiogram. Echocardiography is a painless test that uses sound waves to create images of your heart. It provides your doctor with information about the size and shape of your heart and how well your heart's chambers and valves are working. This procedure takes approximately one hour. There are no restrictions for this procedure.   Follow-Up: At [facility], [facility] are our priority.  As part of our continuing mission to provide you with exceptional heart care, we have created designated Provider Care Teams.  These Care Teams include your primary Cardiologist (physician) and Advanced Practice Providers (APPs -  Physician Assistants and Nurse Practitioners) who all work together to provide you with the care you need, when you need it.  We recommend signing up for the patient portal called "MyChart".  Sign up information is provided on  this After Visit Summary.  MyChart is used to  connect with patients for Virtual Visits (Telemedicine).  Patients are able to view lab/test results, encounter notes, upcoming appointments, etc.  Non-urgent messages can be sent to your provider as well.   To learn more about what you can do with MyChart, go to [email or web address].    Your next appointment:   3 month(s)  The format for your next appointment:   In Person  Provider:   You may see Dr. Johney Frame or one of the following Advanced Practice Providers on your designated Care Team:    Richardson Dopp, PA-C  Robbie Lis, [place]    Other Instructions none   I,Mathew Stumpf,acting as a scribe for Freada Bergeron, MD.,have documented all relevant documentation on the behalf of Freada Bergeron, MD,as directed by  Freada Bergeron, MD while in the presence of Freada Bergeron, MD.  I, Freada Bergeron, MD, have reviewed all documentation for this visit. The documentation on 08/30/20 for the exam, diagnosis, procedures, and orders are all accurate and complete.  Signed, Freada Bergeron, MD  08/30/2020 6:14 PM    [facility]

## 2020-08-31 ENCOUNTER — Encounter: Payer: Self-pay | Admitting: Podiatry

## 2020-08-31 ENCOUNTER — Ambulatory Visit: Payer: Medicare Other | Admitting: Podiatry

## 2020-08-31 DIAGNOSIS — I63439 Cerebral infarction due to embolism of unspecified posterior cerebral artery: Secondary | ICD-10-CM | POA: Insufficient documentation

## 2020-08-31 DIAGNOSIS — E1151 Type 2 diabetes mellitus with diabetic peripheral angiopathy without gangrene: Secondary | ICD-10-CM | POA: Diagnosis not present

## 2020-08-31 DIAGNOSIS — Q828 Other specified congenital malformations of skin: Secondary | ICD-10-CM | POA: Diagnosis not present

## 2020-08-31 NOTE — Progress Notes (Signed)
  Subjective:  Patient ID: Cheryl Hernandez, female    DOB: 05-20-1940,  MRN: [id number]  Cheryl Hernandez presents to clinic today for painful porokeratotic lesions plantar aspect of right foot.  Pain prevent comfortable ambulation. Aggravating factor is weightbearing with or without shoegear.  Patient states her foot felt much better after her last debridement. She states she has not had to wear her pad as much.   She also states both of her great toes have healed with use of Mupirocin Ointment.  Her blood glucose was 125 mg/dl this morning.  PCP is Dr. [name] and last visit was 05/30/2020.  Allergies  Allergen Reactions  . Amlodipine Swelling  . Cefuroxime Axetil Hives and Itching  . Cephalexin Other (See Comments)    Total body pain in joints, couldn't swallow   . Codeine     Tolerates Dilaudid.  . Doxycycline     REACTION: vomiting/ GI upset  . Erythromycin Base Other (See Comments)    unknown  . Linaclotide     Other reaction(s): Diarrhea  . Statins     Myalgia with crestor  . Telmisartan     Other reaction(s): Unknown    Review of Systems: Negative except as noted in the HPI. Objective:   Constitutional Cheryl Hernandez is a pleasant 80 y.o. Caucasian female, WD, WN in NAD. AAO x 3.   Vascular Capillary refill time to digits immediate b/l. Palpable DP pulse(s) b/l lower extremities Nonpalpable PT pulse(s) b/l lower extremities. Pedal hair sparse. Lower extremity skin temperature gradient within normal limits. No pain with calf compression b/l. No edema noted b/l lower extremities. No cyanosis or clubbing noted.  Neurologic Normal speech. Oriented to person, place, and time. Protective sensation intact 5/5 intact bilaterally with 10g monofilament b/l. Vibratory sensation intact b/l.  Dermatologic Pedal skin with normal turgor, texture and tone bilaterally. No open wounds bilaterally. No interdigital macerations bilaterally. Toenails 1-5 bilaterally well  maintained with adequate length. No erythema, no edema, no drainage, no fluctuance. Porokeratotic lesion(s) submet head 2 right foot. No erythema, no edema, no drainage, no fluctuance.  Orthopedic: Normal muscle strength 5/5 to all lower extremity muscle groups bilaterally. No pain crepitus or joint limitation noted with ROM b/l. Hallux valgus with bunion deformity noted b/l lower extremities. Hammertoe(s) noted to the 2-5 bilaterally.   Radiographs: None Assessment:   1. Porokeratosis   2. Diabetes mellitus type 2 with peripheral artery disease ([place])    Plan:  Patient was evaluated and treated and all questions answered. -Examined patient. -Patient to continue soft, supportive shoe gear daily. -Painful porokeratotic lesion(s) submet head 2 right foot pared and enucleated with sterile scalpel blade without incident. Total number of lesions debrided=1. -Patient to report any pedal injuries to medical professional immediately. -Patient/POA to call should there be question/concern in the interim.  Return in about 3 months (around 12/01/2020).  Marzetta Board, DPM

## 2020-09-12 ENCOUNTER — Other Ambulatory Visit: Payer: Self-pay | Admitting: Physician Assistant

## 2020-09-19 LAB — HM DIABETES EYE EXAM

## 2020-09-26 ENCOUNTER — Ambulatory Visit (HOSPITAL_COMMUNITY): Payer: Medicare Other | Attending: Cardiovascular Disease

## 2020-09-26 ENCOUNTER — Other Ambulatory Visit: Payer: Self-pay

## 2020-09-26 DIAGNOSIS — I6523 Occlusion and stenosis of bilateral carotid arteries: Secondary | ICD-10-CM

## 2020-09-26 DIAGNOSIS — I050 Rheumatic mitral stenosis: Secondary | ICD-10-CM | POA: Insufficient documentation

## 2020-09-26 DIAGNOSIS — I10 Essential (primary) hypertension: Secondary | ICD-10-CM | POA: Insufficient documentation

## 2020-09-26 DIAGNOSIS — I471 Supraventricular tachycardia: Secondary | ICD-10-CM | POA: Insufficient documentation

## 2020-09-26 DIAGNOSIS — I350 Nonrheumatic aortic (valve) stenosis: Secondary | ICD-10-CM | POA: Insufficient documentation

## 2020-09-26 DIAGNOSIS — I2510 Atherosclerotic heart disease of native coronary artery without angina pectoris: Secondary | ICD-10-CM | POA: Diagnosis present

## 2020-09-26 DIAGNOSIS — Z952 Presence of prosthetic heart valve: Secondary | ICD-10-CM | POA: Diagnosis not present

## 2020-09-26 DIAGNOSIS — I6310 Cerebral infarction due to embolism of unspecified precerebral artery: Secondary | ICD-10-CM | POA: Diagnosis present

## 2020-09-26 LAB — ECHOCARDIOGRAM COMPLETE
AR max vel: 2.36 cm2
AV Area VTI: 2.44 cm2
AV Area mean vel: 2.35 cm2
AV Mean grad: 9.6 mmHg
AV Peak grad: 17.1 mmHg
Ao pk vel: 2.07 m/s
Area-P 1/2: 2.32 cm2
S' Lateral: 2.3 cm

## 2020-09-27 ENCOUNTER — Telehealth: Payer: Self-pay

## 2020-09-27 DIAGNOSIS — I350 Nonrheumatic aortic (valve) stenosis: Secondary | ICD-10-CM

## 2020-09-27 DIAGNOSIS — I050 Rheumatic mitral stenosis: Secondary | ICD-10-CM

## 2020-09-27 DIAGNOSIS — Z952 Presence of prosthetic heart valve: Secondary | ICD-10-CM

## 2020-09-27 NOTE — Telephone Encounter (Signed)
-----   Message from Freada Bergeron, MD sent at 09/27/2020  8:41 AM EDT ----- Her echo looks good with normal pumping function. Her TAVR valve looks great with no evidence of narrowing or clot. 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

## 2020-09-27 NOTE — Telephone Encounter (Signed)
The patient has been notified of the result and verbalized understanding.  All questions (if any) were answered. Cheryl Iba, RN 09/27/2020 9:55 AM  Orders placed for repeat echo in one year.

## 2020-10-04 ENCOUNTER — Other Ambulatory Visit: Payer: Self-pay

## 2020-10-04 ENCOUNTER — Encounter: Payer: Self-pay | Admitting: Internal Medicine

## 2020-10-04 ENCOUNTER — Ambulatory Visit: Payer: Medicare Other | Admitting: Internal Medicine

## 2020-10-04 ENCOUNTER — Telehealth: Payer: Self-pay | Admitting: Cardiology

## 2020-10-04 VITALS — BP 160/72 | HR 88 | Ht 63.0 in | Wt 138.6 lb

## 2020-10-04 DIAGNOSIS — G453 Amaurosis fugax: Secondary | ICD-10-CM | POA: Diagnosis not present

## 2020-10-04 DIAGNOSIS — I471 Supraventricular tachycardia: Secondary | ICD-10-CM

## 2020-10-04 NOTE — Telephone Encounter (Signed)
Spoke with pt who states her Carvedilol was increased to 6.25mg  bid in April.  Pt has been taking medication as prescribed and BP has been better controlled.  Pt reports BP yesterday of 101/49 and states she felt "really bad"  Pt denies CP, SOB or dizziness.  Pt states she had taken the Carvedilol 6.25mg  - 1 tablet yesterday am and took 1/2 of tablet last night.  This morning BP is 104/61.  Pt sates she is afraid to take full dose of Carvedilol today and wants to know if she should take only 1/2 dose again.  She states she is well hydrated but continues on a low Na+ diet.   Will forward information to Dr Johney Frame and pharmacy team for review and recommendation.  Pt verbalizes understanding and agrees with current plan.

## 2020-10-04 NOTE — Patient Instructions (Addendum)
Medication Instructions:  Your physician recommends that you continue on your current medications as directed. Please refer to the Current Medication list given to you today.  Labwork: None ordered.  Testing/Procedures: None ordered.  Follow-Up: Your physician wants you to follow-up in: as needed with Gregg Taylor, MD    Any Other Special Instructions Will Be Listed Below (If Applicable).  If you need a refill on your cardiac medications before your next appointment, please call your pharmacy.       

## 2020-10-04 NOTE — Progress Notes (Signed)
HPI Ms. Furlan is referred today by Dr. Johney Frame for evaluation of TIA's and SVT. She is a pleasant 80 yo woman with CAD, HTN, and AS s/p TAVR. She wore a cardiac monitor demonstrating a long RP (very short PR) tachycardia which was non-sustained. She has had a h/o amaurosis and is referred today to consider ILR insertion. She has known carotid disease and is pending carotid U/S. She does not had symptomatic atrial fib. She feels her heart racing at times. She felt the SVT when she wore her cardiac monitor though the longest was 19 beats.  Allergies  Allergen Reactions   Amlodipine Swelling   Cefuroxime Axetil Hives and Itching   Cephalexin Other (See Comments)    Total body pain in joints, couldn't swallow    Codeine     Tolerates Dilaudid.   Doxycycline     REACTION: vomiting/ GI upset   Erythromycin Base Other (See Comments)    unknown   Linaclotide     Other reaction(s): Diarrhea   Statins     Myalgia with crestor   Telmisartan     Other reaction(s): Unknown     Current Outpatient Medications  Medication Sig Dispense Refill   acetaminophen (TYLENOL) 500 MG tablet Take 500 mg by mouth every 6 (six) hours as needed (for arthritis pain.).     albuterol (VENTOLIN HFA) 108 (90 Base) MCG/ACT inhaler Inhale 2 puffs into the lungs every 6 (six) hours as needed for wheezing or shortness of breath. 8 g 3   Alpha-D-Galactosidase (BEANO PO) Take 1 tablet by mouth daily as needed (flatulence/abdominal bloating.).     aspirin EC 81 MG tablet Take 81 mg by mouth every evening.     B Complex-C (B-COMPLEX WITH VITAMIN C) tablet Take 1 tablet by mouth daily.      budesonide (ENTOCORT EC) 3 MG 24 hr capsule Take 2 capsules (6 mg total) by mouth 2 (two) times daily as needed (diarrhea). 360 capsule 2   budesonide-formoterol (SYMBICORT) 80-4.5 MCG/ACT inhaler Inhale 2 puffs into the lungs 2 (two) times daily as needed (congestion). 10.2 g 5   calcium carbonate (TUMS - DOSED IN MG ELEMENTAL  CALCIUM) 500 MG chewable tablet Chew 1 tablet by mouth 3 (three) times daily as needed for heartburn.      carvedilol (COREG) 6.25 MG tablet Take 1 tablet (6.25 mg total) by mouth 2 (two) times daily with a meal. 180 tablet 1   Cholecalciferol (VITAMIN D3) 75 MCG (3000 UT) TABS Take 3,000 Units by mouth every other day.     clopidogrel (PLAVIX) 75 MG tablet Take 1 tablet (75 mg total) by mouth daily. 90 tablet 3   docusate sodium (COLACE) 100 MG capsule daily.     Evolocumab (REPATHA SURECLICK) 161 MG/ML SOAJ INJECT 1 PEN INTO THE SKIN EVERY 14 DAYS 6 mL 3   fexofenadine (ALLEGRA) 180 MG tablet Take 180 mg by mouth daily.     Glucose Blood (FREESTYLE LITE TEST VI) Once daily     guaifenesin (HUMIBID E) 400 MG TABS tablet Take 400 mg by mouth daily.     KRILL OIL PO Take 1 capsule by mouth daily.     losartan (COZAAR) 25 MG tablet TAKE 1 TABLET BY MOUTH DAILY 90 tablet 3   Magnesium Citrate 125 MG CAPS Take 125 mg by mouth daily.     metFORMIN (GLUCOPHAGE-XR) 500 MG 24 hr tablet Take 500 mg by mouth daily.     Multiple  Vitamins-Minerals (MULTIVITAMIN WITH MINERALS) tablet Take 1 tablet by mouth every other day. In the morning     mupirocin ointment (BACTROBAN) 2 % Apply to both great toes once daily until healed. 30 g 0   nitroGLYCERIN (NITROSTAT) 0.4 MG SL tablet Place 1 tablet (0.4 mg total) under the tongue every 5 (five) minutes as needed for chest pain. 25 tablet 3   Probiotic Product (ALIGN) 4 MG CAPS Take 4 mg by mouth every other day.     rosuvastatin (CRESTOR) 5 MG tablet Takes it about twice a week     temazepam (RESTORIL) 15 MG capsule Take one po daily at bedtime as needed for sleep     Turmeric 400 MG CAPS Take by mouth daily.     zolpidem (AMBIEN) 5 MG tablet Take 5 mg by mouth at bedtime as needed for sleep.     No current facility-administered medications for this visit.     Past Medical History:  Diagnosis Date   Arthritis    Asthma    CAD (coronary artery disease)     a. NSTEMI 2009 s/p DES to RCAx2.   Carotid artery disease ([place])    a. Mild-moderate - followed by [facility].   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 Burt Knack and Dr. Roxy Manns    Severe aortic stenosis     ROS:   All systems reviewed and negative except as noted in the HPI.   Past Surgical History:  Procedure Laterality Date   ANGIOPLASTY     CORONARY ANGIOPLASTY WITH STENT PLACEMENT     RIGHT/LEFT HEART CATH AND CORONARY ANGIOGRAPHY N/A 08/08/2019   Procedure: RIGHT/LEFT HEART CATH AND CORONARY ANGIOGRAPHY;  Surgeon: Belva Crome, MD;  Location: [facility];  Service: Cardiovascular;  Laterality: N/A;   TEE WITHOUT CARDIOVERSION N/A 12/20/2019   Procedure: TRANSESOPHAGEAL ECHOCARDIOGRAM (TEE);  Surgeon: Sherren Mocha, MD;  Location: [facility];  Service: Open Heart Surgery;  Laterality: N/A;   TONSILLECTOMY     TOTAL ABDOMINAL HYSTERECTOMY     TOTAL HIP ARTHROPLASTY     TRANSCATHETER AORTIC VALVE REPLACEMENT, TRANSFEMORAL N/A 12/20/2019   Procedure: TRANSCATHETER AORTIC VALVE REPLACEMENT, TRANSFEMORAL;  Surgeon: Sherren Mocha, MD;  Location: [facility];  Service: Open Heart Surgery;  Laterality: N/A;     Family History  Problem Relation Age of Onset   Allergies Mother    Hyperlipidemia Mother    Prostate cancer Maternal Grandfather    Heart attack Father    Lung disease Neg Hx      Social History    Socioeconomic History   Marital status: Widowed    Spouse name: Not on file   Number of children: Not on file   Years of education: Not on file   Highest education level:  Not on file  Occupational History   Occupation: retired Therapist, sports  Tobacco Use   Smoking status: Former    Packs/day: 1.00    Years: 30.00    Pack years: 30.00    Types: Cigarettes    Quit date: 03/31/1998    Years since quitting: 22.5   Smokeless tobacco: Never   Tobacco comments:    Quit intermittently since her 32s.  Vaping Use   Vaping Use: Never used  Substance and Sexual Activity   Alcohol use: Yes    Alcohol/week: 0.0 standard drinks    Comment: Rare.   Drug use: No   Sexual activity: Not on file  Other Topics Concern   Not on file  Social History Narrative   Originally from [place]. She has traveled to [place], [place], [place], & [place]. Previously worked as a Marine scientist. No known TB exposure. No pets currently. No bird, hot tube, or asbestos exposure. Enjoys playing bridge.    Social Determinants of Health   Financial Resource Strain: Not on file  Food Insecurity: Not on file  Transportation Needs: Not on file  Physical Activity: Not on file  Stress: Not on file  Social Connections: Not on file  Intimate Partner Violence: Not on file     BP (!) 160/72   Pulse 88   Ht 5\' 3"  (1.6 m)   Wt 138 lb 9.6 oz (62.9 kg)   SpO2 94%   BMI 24.55 kg/m   Physical Exam:  Well appearing NAD HEENT: Unremarkable Neck:  No JVD, no thyromegally Lymphatics:  No adenopathy Back:  No CVA tenderness Lungs:  Clear with no wheezes HEART:  Regular rate rhythm, no murmurs, no rubs, no clicks Abd:  soft, positive bowel sounds, no organomegally, no rebound, no guarding Ext:  2 plus pulses, no edema, no cyanosis, no clubbing Skin:  No rashes no nodules Neuro:  CN II through XII intact, motor grossly intact  EKG - NSR  Assess/Plan:  Amaursosis Fugax - I have discussed the indications for ILR insertion. She would like to undergo  carotid u/s first, and if there is no progression of her carotid disease, then we will consider placing the ILR. SVT - I have reviewed her strips. She has non-sustained atrial tachy likely near the AV node as her PR interval in tachy is very short. She is mildly symptomatic. No additional treatment at this time. AS - she is s/p TAVR. No symptoms.  Carleene Overlie Orlyn Odonoghue,MD

## 2020-10-04 NOTE — Telephone Encounter (Signed)
    Pt c/o medication issue:  1. Name of Medication:   carvedilol (COREG) 6.25 MG tablet    2. How are you currently taking this medication (dosage and times per day)? Take 1 tablet (6.25 mg total) by mouth 2 (two) times daily with a meal.  3. Are you having a reaction (difficulty breathing--STAT)?   4. What is your medication issue? Pt said her blood sugar is low, she wanted to know if she needs to hold taking her carvedilol

## 2020-10-04 NOTE — Telephone Encounter (Signed)
Spoke with pt and confirmed she is not taking Bystolic or Clonidine.  Removed these medications from her MAR.  Carvedilol 6.25mg  bid has lowered her BP's but only for the past 2 days have they been so low that pt has not felt well.  Pt advised per pharmacy team she may take Carvedilol 6.25mg  - 1/2 tablet this am and pm then she can resume her regular dosing of Carvedilol 6.25mg  - 1 tablet by mouth twice daily and continue to keep BP log.  Pt verbalizes understanding and agrees with current plan.  Pt will notify office for BP below 100/60.

## 2020-10-04 NOTE — Telephone Encounter (Signed)
Would see how her other  BP readings have been trending since she's been on this dose of carvedilol since April. Has she just had the 2 softer BP readings yesterday and today or has this been a more consistent trend over the past few months?  If it's just been these 2 softer readings and her BP has been otherwise stable, would recommend she take 1/2 tab of carvedilol today due to soft BP and then continue to monitor BP with plan to remain on current dose of carvedilol.  Med list with pt also needs to be clarified: -She has Bystolic 5mg  daily listed. She should not be taking this in addition to her carvedilol. -She has clonidine 0.1mg  listed but daily vs 1x dose is not specified. Please remove from med list if this was a 1x dose. If it's a daily dose and her BP have been trending lower, would recommend stopping clonidine.

## 2020-10-09 ENCOUNTER — Telehealth: Payer: Self-pay | Admitting: Pulmonary Disease

## 2020-10-09 MED ORDER — PREDNISONE 10 MG PO TABS: ORAL_TABLET | ORAL | 0 refills | Status: DC

## 2020-10-09 NOTE — Telephone Encounter (Signed)
Called and spoke with Patient.  Patient stated over the weekend she started having chest congestion.  Patient stated she had some left over Prednisone that she took and it helped.  Patient stated she would like a Prednisone prescription sent to [facility].  Patient denies any fever, but stated she is having a productive cough, with yellow to brown sputum.  Message routed to Dr. Elsworth Soho to advise

## 2020-10-09 NOTE — Telephone Encounter (Signed)
Called and spoke with Patient. Dr. Bari Mantis recommendations given.  Understanding stated.  Prednisone prescription sent to requested pharmacy. Nothing further at this time.

## 2020-10-09 NOTE — Telephone Encounter (Signed)
Pt stated that she is feeling very congested and stated that she took some prednisone and it made a huge difference and she states that she knows that she is in need of some prednisone.  Pharmacy;  [phone] - [place], [place]; [phone]

## 2020-10-11 ENCOUNTER — Other Ambulatory Visit: Payer: Self-pay

## 2020-10-11 DIAGNOSIS — I6523 Occlusion and stenosis of bilateral carotid arteries: Secondary | ICD-10-CM

## 2020-10-15 ENCOUNTER — Telehealth: Payer: Self-pay | Admitting: Cardiology

## 2020-10-15 NOTE — Telephone Encounter (Signed)
The patient has been having dizziness for a while now but last week she had a stronger episode. On Saturday around 3 pm patient started feeling her ears getting congested and then got weak this causing her to check her BP because this is how an "episode starts". 114/60 86  then 3:30 106/55 85 3:40 131/59 81, 4 pm 113/58 81 states she was lightheaded and dizzy. She states all BP reading were in the sitting position. She is concerned that her Losartan is causing this. Patient has self reduced her Carvedilol to 3.125 mg BID on Saturday night. She has not taken her Losartan today. Current BP 164/79 HR 84 retake 152/75 HR 87. Gave ED precautions and dicussed her BP readings now currently on the higher side and educated about the risk of holding BP medications and it going too high. Will forward to MD for advisement.

## 2020-10-15 NOTE — Telephone Encounter (Signed)
If she thinks her dizziness is related to the losartan and got better with stopping the medication than she is right and maybe her blood pressure was too low leading to her symptoms. Decreasing the dose of the coreg would help as well. Based on her blood pressures today, I would say she needs at least a little more blood pressure coverage. She can either do this by increasing her coreg back to 6.25mg  BID OR by starting back losartan at a reduced dose of 12.5mg  daily.  If her symptoms did not get better with stopping the medication than it is not due to the losartan and we need to think of other causes (dehydration, vertigo etc).

## 2020-10-15 NOTE — Telephone Encounter (Signed)
Patient states for the past few weeks she has been having dizziness on and off. She states the dizziness is accompanied by congestion, weakness, and tingling in hands and feet. States states her BP also got down really low during the episode of dizziness. At one point, it got so bad while she was driving that she couldn't remain in her lane on the road. She states she has not taken her Losartan today because she assumes it may be causing the dizziness. She states since holding the Losartan she hasn't had any symptoms and she would like to know if she needs to discontinue. Please advise.  STAT if patient feels like he/she is going to faint   Are you dizzy now?  Not currently  Do you feel faint or have you passed out?  No   Do you have any other symptoms?  Weakness, tingling in hands and feet, congestion  Have you checked your HR and BP (record if available)?   W/o medication: 124/64  132/67 150/82

## 2020-10-16 ENCOUNTER — Other Ambulatory Visit: Payer: Self-pay | Admitting: Physician Assistant

## 2020-10-16 DIAGNOSIS — Z952 Presence of prosthetic heart valve: Secondary | ICD-10-CM

## 2020-10-16 NOTE — Telephone Encounter (Signed)
Spoke with the pt and she states she will continue taking coreg 6.25 mg po bid and hold off on losartan for the next week, and touch base with [place] sometime next week to report how she is doing on this regimen, and feeling. Pt states she held off on losartan yesterday and took her coreg, and has been asymptomatic.  Informed the pt that sounds good, and to remember to mychart or call the office next week, to report how she is doing since holding losartan and continuing coreg 6.25 mg po bid. Pt verbalized understanding and agrees with this plan. Will forward this message to Dr. Johney Frame as an Juluis Rainier, to make her aware of this plan.

## 2020-10-16 NOTE — Telephone Encounter (Signed)
Great plan! Thank you!

## 2020-10-17 NOTE — Progress Notes (Signed)
NEUROLOGY CONSULTATION NOTE  Cheryl Hernandez MRN: [id number] DOB: [birth year]  Referring provider: [name], MD Primary care provider: [name], MD  Reason for consult:  stroke  Assessment/Plan:   Cardioembolic strokes s/p TAVR Left amaurosis fugax. She does have left ICA stenosis but not significant enough to warrant surgical intervention - agree with evaluating for paroxysmal atrial fibrillation Left internal carotid artery stenosis. 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  LDL goal less than 70 - Glycemic control.  Hgb A1c goal less than 7 - Normotensive blood pressure 2. Plan for implantable loop recorder 3. For further evaluation of neuropathy, check ANA with reflex, B12, TSH, ACE, SPEP/IFE 4. Follow up 6 months.    Subjective:  Cheryl Hernandez is an 80 year old right-handed female with CAD, carotid artery disease, DM II, HLD and asthma who presents for stroke.  History supplemented by cardiology and referring provider's notes.  She had a postoperative stroke in September 2021 following TAVR for aortic stenosis.  She developed headache and blurred vision.  MRI of brain showed multifocal acute infarcts predominantly in the bilateral posterior circulation involving the left cerebellum, bilateral occipital and posterior parietal lobes, and right thalamus with trace anterior circulation involvement in the superior frontal lobes.  CTA of head and neck showed atherosclerotic calcification of the carotic bifurcation (25% stenosis) bilaterally and in the cavernous carotid bilaterally as well as probable right P3 segment stenosis.  She was discharged on dual antiplatelet therapy, ASA and  Plavix.  She had a 14 day Zio patch which was negative for a fib.  Following this stroke, she endorsed visual hallucinations and was referred to neuropsychology with Dr. Chauncy Passy.  In February 2022, she had a run of SVT which was converted back to sinus rhythm.  She had a 30 day cardiac event monitor from mid February to mid March which reveald no a fib, SVT or VT.  In June, she had two episodes back to back of vision loss in the corner of her left eye.  Each episode lasted 30 minutes and resolved.  Notes mention that she was not taking Plavix at that time but she says she was never off of Plavix 75mg . Echocardiogram from 09/26/2020 showed EF 65-70% with mild to moderate mitral stenosis but TAVR valve looked okay with no evidence of narrowing or clot.  Carotid doppler on 10/18/2020 showed 40-59% stenosis left ICA, 1-39% stenosis right ICA.  Plan is for implantable loop recorder.    She is also concerned about her neuropathy.  She notes more numbness on bottom of her feet. Some pins and needles but no pain.    Current medications:  ASA 81mg , Plavix 75mg , Coreg, metformin, Repatha  PAST MEDICAL HISTORY: Past Medical History:  Diagnosis Date   Arthritis    Asthma    CAD (coronary artery disease)    a. NSTEMI 2009 s/p DES to RCAx2.   Carotid artery disease ([place])    a. Mild-moderate - followed by [facility].   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 Burt Knack  and Dr. Roxy Manns    Severe aortic stenosis     PAST SURGICAL HISTORY: Past Surgical History:  Procedure Laterality Date   ANGIOPLASTY     CORONARY ANGIOPLASTY WITH STENT PLACEMENT     RIGHT/LEFT HEART CATH AND CORONARY ANGIOGRAPHY N/A 08/08/2019   Procedure: RIGHT/LEFT HEART CATH AND CORONARY ANGIOGRAPHY;  Surgeon: Belva Crome, MD;  Location: [facility];  Service: Cardiovascular;  Laterality: N/A;   TEE WITHOUT CARDIOVERSION N/A 12/20/2019   Procedure: TRANSESOPHAGEAL ECHOCARDIOGRAM (TEE);  Surgeon: Sherren Mocha, MD;  Location: [facility];  Service: Open Heart Surgery;  Laterality: N/A;   TONSILLECTOMY     TOTAL ABDOMINAL HYSTERECTOMY     TOTAL HIP ARTHROPLASTY     TRANSCATHETER AORTIC VALVE REPLACEMENT, TRANSFEMORAL N/A 12/20/2019   Procedure: TRANSCATHETER AORTIC VALVE REPLACEMENT, TRANSFEMORAL;  Surgeon: Sherren Mocha, MD;  Location: [facility];  Service: Open Heart Surgery;  Laterality: N/A;    MEDICATIONS: Current Outpatient Medications on File Prior to Visit  Medication Sig Dispense Refill   acetaminophen (TYLENOL) 500 MG tablet Take 500 mg by mouth every 6 (six) hours as needed (for arthritis pain.).     albuterol (VENTOLIN HFA) 108 (90 Base) MCG/ACT inhaler Inhale 2 puffs into the lungs every 6 (six) hours as needed for wheezing or shortness of breath. 8 g 3   Alpha-D-Galactosidase (BEANO PO) Take 1 tablet by mouth daily as needed (flatulence/abdominal bloating.).     aspirin EC 81 MG tablet Take 81 mg by mouth every evening.     B Complex-C (B-COMPLEX WITH VITAMIN C) tablet Take 1 tablet by mouth daily.      budesonide (ENTOCORT EC) 3 MG 24 hr capsule Take 2 capsules (6 mg total) by mouth 2 (two) times daily as needed (diarrhea). 360 capsule 2   budesonide-formoterol (SYMBICORT) 80-4.5 MCG/ACT inhaler Inhale 2 puffs into the lungs 2 (two) times daily as needed (congestion). 10.2 g 5   calcium carbonate (TUMS - DOSED IN MG ELEMENTAL CALCIUM) 500 MG  chewable tablet Chew 1 tablet by mouth 3 (three) times daily as needed for heartburn.      carvedilol (COREG) 6.25 MG tablet Take 1 tablet (6.25 mg total) by mouth 2 (two) times daily with a meal. 180 tablet 1   Cholecalciferol (VITAMIN D3) 75 MCG (3000 UT) TABS Take 3,000 Units by mouth every other day.     clopidogrel (PLAVIX) 75 MG tablet Take 1 tablet (75 mg total) by mouth daily. 90 tablet 3   docusate sodium (COLACE) 100 MG capsule daily.     Evolocumab (REPATHA SURECLICK) 694 MG/ML SOAJ INJECT 1 PEN INTO THE SKIN EVERY 14 DAYS 6 mL 3   fexofenadine (ALLEGRA) 180 MG tablet Take 180 mg by mouth daily.     Glucose Blood (FREESTYLE LITE TEST VI) Once daily     guaifenesin (HUMIBID E) 400 MG TABS tablet Take 400 mg by mouth daily.     KRILL OIL PO Take 1 capsule by mouth daily.  losartan (COZAAR) 25 MG tablet TAKE 1 TABLET BY MOUTH DAILY 90 tablet 3   Magnesium Citrate 125 MG CAPS Take 125 mg by mouth daily.     metFORMIN (GLUCOPHAGE-XR) 500 MG 24 hr tablet Take 500 mg by mouth daily.     Multiple Vitamins-Minerals (MULTIVITAMIN WITH MINERALS) tablet Take 1 tablet by mouth every other day. In the morning     mupirocin ointment (BACTROBAN) 2 % Apply to both great toes once daily until healed. 30 g 0   nitroGLYCERIN (NITROSTAT) 0.4 MG SL tablet Place 1 tablet (0.4 mg total) under the tongue every 5 (five) minutes as needed for chest pain. 25 tablet 3   predniSONE (DELTASONE) 10 MG tablet Take 4 tabs daily x4day,3tabs dailyx 4days,2tabs daily x 4days,1 tab daily x4 days 40 tablet 0   Probiotic Product (ALIGN) 4 MG CAPS Take 4 mg by mouth every other day.     rosuvastatin (CRESTOR) 5 MG tablet Takes it about twice a week     temazepam (RESTORIL) 15 MG capsule Take one po daily at bedtime as needed for sleep     Turmeric 400 MG CAPS Take by mouth daily.     zolpidem (AMBIEN) 5 MG tablet Take 5 mg by mouth at bedtime as needed for sleep.     No current facility-administered medications on file  prior to visit.    ALLERGIES: Allergies  Allergen Reactions   Amlodipine Swelling   Cefuroxime Axetil Hives and Itching   Cephalexin Other (See Comments)    Total body pain in joints, couldn't swallow    Codeine     Tolerates Dilaudid.   Doxycycline     REACTION: vomiting/ GI upset   Erythromycin Base Other (See Comments)    unknown   Linaclotide     Other reaction(s): Diarrhea   Statins     Myalgia with crestor   Telmisartan     Other reaction(s): Unknown    FAMILY HISTORY: Family History  Problem Relation Age of Onset   Allergies Mother    Hyperlipidemia Mother    Prostate cancer Maternal Grandfather    Heart attack Father    Lung disease Neg Hx     Objective:  Blood pressure 129/71, pulse 98, height 5\' 3"  (1.6 m), weight 137 lb (62.1 kg), SpO2 97 %. General: No acute distress.  Patient appears well-groomed.   Head:  Normocephalic/atraumatic Eyes:  fundi examined but not visualized Neck: supple, no paraspinal tenderness, full range of motion Back: No paraspinal tenderness Heart: regular rate and rhythm Lungs: Clear to auscultation bilaterally. Vascular: No carotid bruits. Neurological Exam: Mental status: alert and oriented to person, place, and time, recent and remote memory intact, fund of knowledge intact, attention and concentration intact, speech fluent and not dysarthric, language intact. Cranial nerves: CN I: not tested CN II: pupils equal, round and reactive to light, visual fields intact CN III, IV, VI:  full range of motion, no nystagmus, no ptosis CN V: facial sensation intact. CN VII: upper and lower face symmetric CN VIII: hearing intact CN IX, X: gag intact, uvula midline CN XI: sternocleidomastoid and trapezius muscles intact CN XII: tongue midline Bulk & Tone: normal, no fasciculations. Motor:  muscle strength 5/5 throughout Sensation:  Pinprick and vibratory sensation reduced in feet, proprioception reduced in toes. Deep Tendon Reflexes:   1+upper extremities, absent lower extremities  toes downgoing.   Finger to nose testing:  Without dysmetria.   Heel to shin:  Without dysmetria.   Gait:  Mildly cautious.  Romberg negative.    Thank you for allowing me to take part in the care of this patient.  Metta Clines, DO  CC: [name], MD

## 2020-10-18 ENCOUNTER — Ambulatory Visit (HOSPITAL_COMMUNITY)
Admission: RE | Admit: 2020-10-18 | Discharge: 2020-10-18 | Disposition: A | Discharge status: Home / Self Care | Payer: Medicare Other | Source: Ambulatory Visit | Attending: Vascular Surgery | Admitting: Vascular Surgery

## 2020-10-18 ENCOUNTER — Encounter: Payer: Self-pay | Admitting: Vascular Surgery

## 2020-10-18 ENCOUNTER — Other Ambulatory Visit: Payer: Self-pay

## 2020-10-18 ENCOUNTER — Ambulatory Visit: Payer: Medicare Other | Admitting: Vascular Surgery

## 2020-10-18 VITALS — BP 167/72 | HR 86 | Temp 98.2°F | Resp 16 | Ht 63.0 in | Wt 137.0 lb

## 2020-10-18 DIAGNOSIS — I6522 Occlusion and stenosis of left carotid artery: Secondary | ICD-10-CM | POA: Diagnosis not present

## 2020-10-18 DIAGNOSIS — I6523 Occlusion and stenosis of bilateral carotid arteries: Secondary | ICD-10-CM | POA: Diagnosis present

## 2020-10-18 NOTE — Progress Notes (Signed)
Patient name: Cheryl Hernandez MRN: [id number] DOB: [birth year] Sex: female  HPI: Cheryl Hernandez is a 80 y.o. female, who we have followed since 2018 for asymptomatic carotid occlusive disease.about a month ago she had an episode where she had some loss of vision in the corner of her left eye.  This lasted about 30 minutes.  She has not had an episode since then.  She does have a history of some atrial tachycardia and is under evaluation by Dr. Lovena Le as well.  She did have a stroke in the past perioperatively with her TAVR aortic valve replacement.  He had an MRI at that time which showed multiple infarcts bilateral posterior circulation and some trace anterior circulation as well certainly suggestive of a central embolic source rather than carotid disease.  Other medical problems include coronary artery disease, diabetes, hypertension, hyperlipidemia, peripheral neuropathy of which have been stable.  She is on Plavix aspirin and Repatha.  Past Medical History:  Diagnosis Date   Arthritis    Asthma    CAD (coronary artery disease)    a. NSTEMI 2009 s/p DES to RCAx2.   Carotid artery disease ([place])    a. Mild-moderate - followed by [facility].   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 Burt Knack and Dr. Roxy Manns    Severe aortic stenosis    Past Surgical History:  Procedure Laterality Date   ANGIOPLASTY     CORONARY ANGIOPLASTY WITH STENT PLACEMENT      RIGHT/LEFT HEART CATH AND CORONARY ANGIOGRAPHY N/A 08/08/2019   Procedure: RIGHT/LEFT HEART CATH AND CORONARY ANGIOGRAPHY;  Surgeon: Belva Crome, MD;  Location: [facility];  Service: Cardiovascular;  Laterality: N/A;   TEE WITHOUT CARDIOVERSION N/A 12/20/2019   Procedure: TRANSESOPHAGEAL ECHOCARDIOGRAM (TEE);  Surgeon: Sherren Mocha, MD;  Location: [facility];  Service: Open Heart Surgery;  Laterality: N/A;   TONSILLECTOMY     TOTAL ABDOMINAL HYSTERECTOMY     TOTAL HIP ARTHROPLASTY     TRANSCATHETER AORTIC VALVE REPLACEMENT, TRANSFEMORAL N/A 12/20/2019   Procedure: TRANSCATHETER AORTIC VALVE REPLACEMENT, TRANSFEMORAL;  Surgeon: Sherren Mocha, MD;  Location: [facility];  Service: Open Heart Surgery;  Laterality: N/A;    Family History  Problem Relation Age of Onset   Allergies Mother    Hyperlipidemia Mother    Prostate cancer Maternal Grandfather    Heart attack Father    Lung disease Neg Hx     SOCIAL HISTORY: Social History   Socioeconomic History   Marital status: Widowed    Spouse name: Not on file   Number of children: Not on file   Years of education: Not on file   Highest education level: Not on file  Occupational History   Occupation: retired Therapist, sports  Tobacco Use   Smoking  status: Former    Packs/day: 1.00    Years: 30.00    Pack years: 30.00    Types: Cigarettes    Quit date: 03/31/1998    Years since quitting: 22.5   Smokeless tobacco: Never   Tobacco comments:    Quit intermittently since her 10s.  Vaping Use   Vaping Use: Never used  Substance and Sexual Activity   Alcohol use: Yes    Alcohol/week: 0.0 standard drinks    Comment: Rare.   Drug use: No   Sexual activity: Not on file  Other Topics Concern   Not on file  Social History Narrative   Originally from [place]. She has traveled to [place], [place], [place], & [place]. Previously worked as a Marine scientist. No known TB exposure. No pets currently. No bird, hot tube, or asbestos exposure. Enjoys  playing bridge.    Social Determinants of Health   Financial Resource Strain: Not on file  Food Insecurity: Not on file  Transportation Needs: Not on file  Physical Activity: Not on file  Stress: Not on file  Social Connections: Not on file  Intimate Partner Violence: Not on file    Allergies  Allergen Reactions   Amlodipine Swelling   Cefuroxime Axetil Hives and Itching   Cephalexin Other (See Comments)    Total body pain in joints, couldn't swallow    Codeine     Tolerates Dilaudid.   Doxycycline     REACTION: vomiting/ GI upset   Erythromycin Base Other (See Comments)    unknown   Linaclotide     Other reaction(s): Diarrhea   Statins     Myalgia with crestor   Telmisartan     Other reaction(s): Unknown    Current Outpatient Medications  Medication Sig Dispense Refill   acetaminophen (TYLENOL) 500 MG tablet Take 500 mg by mouth every 6 (six) hours as needed (for arthritis pain.).     albuterol (VENTOLIN HFA) 108 (90 Base) MCG/ACT inhaler Inhale 2 puffs into the lungs every 6 (six) hours as needed for wheezing or shortness of breath. 8 g 3   Alpha-D-Galactosidase (BEANO PO) Take 1 tablet by mouth daily as needed (flatulence/abdominal bloating.).     aspirin EC 81 MG tablet Take 81 mg by mouth every evening.     B Complex-C (B-COMPLEX WITH VITAMIN C) tablet Take 1 tablet by mouth daily.      budesonide (ENTOCORT EC) 3 MG 24 hr capsule Take 2 capsules (6 mg total) by mouth 2 (two) times daily as needed (diarrhea). 360 capsule 2   budesonide-formoterol (SYMBICORT) 80-4.5 MCG/ACT inhaler Inhale 2 puffs into the lungs 2 (two) times daily as needed (congestion). 10.2 g 5   calcium carbonate (TUMS - DOSED IN MG ELEMENTAL CALCIUM) 500 MG chewable tablet Chew 1 tablet by mouth 3 (three) times daily as needed for heartburn.      carvedilol (COREG) 6.25 MG tablet Take 1 tablet (6.25 mg total) by mouth 2 (two) times daily with a meal. 180 tablet 1   Cholecalciferol (VITAMIN D3) 75  MCG (3000 UT) TABS Take 3,000 Units by mouth every other day.     clopidogrel (PLAVIX) 75 MG tablet Take 1 tablet (75 mg total) by mouth daily. 90 tablet 3   docusate sodium (COLACE) 100 MG capsule daily.     Evolocumab (REPATHA SURECLICK) 176 MG/ML SOAJ INJECT 1 PEN INTO THE SKIN EVERY 14 DAYS 6 mL 3   fexofenadine (ALLEGRA) 180 MG tablet Take 180 mg by mouth daily.  Glucose Blood (FREESTYLE LITE TEST VI) Once daily     guaifenesin (HUMIBID E) 400 MG TABS tablet Take 400 mg by mouth daily.     KRILL OIL PO Take 1 capsule by mouth daily.     Magnesium Citrate 125 MG CAPS Take 125 mg by mouth daily.     metFORMIN (GLUCOPHAGE-XR) 500 MG 24 hr tablet Take 500 mg by mouth daily.     Multiple Vitamins-Minerals (MULTIVITAMIN WITH MINERALS) tablet Take 1 tablet by mouth every other day. In the morning     mupirocin ointment (BACTROBAN) 2 % Apply to both great toes once daily until healed. 30 g 0   nitroGLYCERIN (NITROSTAT) 0.4 MG SL tablet Place 1 tablet (0.4 mg total) under the tongue every 5 (five) minutes as needed for chest pain. 25 tablet 3   predniSONE (DELTASONE) 10 MG tablet Take 4 tabs daily x4day,3tabs dailyx 4days,2tabs daily x 4days,1 tab daily x4 days 40 tablet 0   Probiotic Product (ALIGN) 4 MG CAPS Take 4 mg by mouth every other day.     rosuvastatin (CRESTOR) 5 MG tablet Takes it about twice a week     temazepam (RESTORIL) 15 MG capsule Take one po daily at bedtime as needed for sleep     Turmeric 400 MG CAPS Take by mouth daily.     zolpidem (AMBIEN) 5 MG tablet Take 5 mg by mouth at bedtime as needed for sleep.     losartan (COZAAR) 25 MG tablet TAKE 1 TABLET BY MOUTH DAILY (Patient not taking: Reported on 10/18/2020) 90 tablet 3   No current facility-administered medications for this visit.    ROS:   General:  No weight loss, Fever, chills  HEENT: No recent headaches, no nasal bleeding, no visual changes, no sore throat  Neurologic: No dizziness, blackouts, seizures. No  recent symptoms of stroke or mini- stroke. No recent episodes of slurred speech, or temporary blindness.  Cardiac: No recent episodes of chest pain/pressure, no shortness of breath at rest.  No shortness of breath with exertion.  Denies history of atrial fibrillation or irregular heartbeat  Vascular: No history of rest pain in feet.  No history of claudication.  No history of non-healing ulcer, No history of DVT   Pulmonary: No home oxygen, no productive cough, no hemoptysis,  No asthma or wheezing  Musculoskeletal:  [ ]  Arthritis, [ ]  Low back pain,  [ ]  Joint pain  Hematologic:No history of hypercoagulable state.  No history of easy bleeding.  No history of anemia  Gastrointestinal: No hematochezia or melena,  No gastroesophageal reflux, no trouble swallowing  Urinary: [ ]  chronic Kidney disease, [ ]  on HD - [ ]  MWF or [ ]  TTHS, [ ]  Burning with urination, [ ]  Frequent urination, [ ]  Difficulty urinating;   Skin: No rashes  Psychological: No history of anxiety,  No history of depression   Physical Examination  Vitals:   10/18/20 1527 10/18/20 1529  BP: 137/73 (!) 167/72  Pulse: 86   Resp: 16   Temp: 98.2 F (36.8 C)   SpO2: 96%   Weight: 137 lb (62.1 kg)   Height: 5\' 3"  (1.6 m)     Body mass index is 24.27 kg/m.  General:  Alert and oriented, no acute distress HEENT: Normal Neck: No JVD Cardiac: Regular Rate and Rhythm  Skin: No rash Extremity Pulses:  2+ radial, brachial pulses bilaterally Musculoskeletal: No deformity or edema  Neurologic: Upper and lower extremity motor 5/5 and symmetric  DATA:  Patient had a carotid duplex exam today which showed no significant right internal carotid artery stenosis.  Less than 60% left internal carotid artery stenosis.  Velocities are essentially unchanged from August 2021.  ASSESSMENT: Approximately 50% left internal carotid artery stenosis essentially unchanged from 1 year ago less than 40% right internal carotid artery  stenosis.  Certainly it would be possible to have embolic debris from a 62% lesion from her left carotid although this would be less likely.  I discussed with the patient today the possibility of arch aortogram carotid angiogram for further evaluation as this would be considering looking for ulcerated plaque.  Other potential imaging would be a CT angiogram but this would be less diagnostic for ulcerated plaque.  I would only consider this if she had an additional event as it would be fairly invasive and certainly would not be without risk.   PLAN: Continued medical management with antiplatelet agent and cholesterol-lowering agent.  Dr. Lovena Le is apparently considering a loop recorder.  The patient will follow up with [place] in 1 year with repeat carotid duplex scan.  She would prefer to see our MD rather than PA.  Therefore she will be scheduled to see Dr. Fortunato Curling in 1 year.  Ruta Hinds, MD [facility] Office: [place], MD [facility] Office: [phone]

## 2020-10-19 ENCOUNTER — Ambulatory Visit: Payer: Medicare Other | Admitting: Neurology

## 2020-10-19 ENCOUNTER — Other Ambulatory Visit: Payer: Medicare Other

## 2020-10-19 ENCOUNTER — Encounter: Payer: Self-pay | Admitting: Neurology

## 2020-10-19 VITALS — BP 129/71 | HR 98 | Ht 63.0 in | Wt 137.0 lb

## 2020-10-19 DIAGNOSIS — Z952 Presence of prosthetic heart valve: Secondary | ICD-10-CM | POA: Diagnosis not present

## 2020-10-19 DIAGNOSIS — I6522 Occlusion and stenosis of left carotid artery: Secondary | ICD-10-CM

## 2020-10-19 DIAGNOSIS — G629 Polyneuropathy, unspecified: Secondary | ICD-10-CM

## 2020-10-19 DIAGNOSIS — I25119 Atherosclerotic heart disease of native coronary artery with unspecified angina pectoris: Secondary | ICD-10-CM

## 2020-10-19 DIAGNOSIS — I10 Essential (primary) hypertension: Secondary | ICD-10-CM

## 2020-10-19 DIAGNOSIS — I63431 Cerebral infarction due to embolism of right posterior cerebral artery: Secondary | ICD-10-CM

## 2020-10-19 DIAGNOSIS — E119 Type 2 diabetes mellitus without complications: Secondary | ICD-10-CM

## 2020-10-19 LAB — VITAMIN B12: Vitamin B-12: 464 pg/mL (ref 211–911)

## 2020-10-19 LAB — TSH: TSH: 0.99 u[IU]/mL (ref 0.35–5.50)

## 2020-10-19 NOTE — Progress Notes (Unsigned)
nasal

## 2020-10-19 NOTE — Patient Instructions (Signed)
Agree with implantable loop recorder Will check neuropathy labs:  ANA with reflex, B12, TSH, ACE, SPEP/IFE Follow up 6 months.

## 2020-10-22 LAB — ANGIOTENSIN CONVERTING ENZYME: Angiotensin-Converting Enzyme: 14 U/L (ref 9–67)

## 2020-10-23 NOTE — Progress Notes (Signed)
LMOVM for the pt to give [place] a call back.

## 2020-10-30 ENCOUNTER — Telehealth: Payer: Self-pay | Admitting: Internal Medicine

## 2020-10-30 LAB — PROTEIN ELECTROPHORESIS
A/G Ratio: 1 (ref 0.7–1.7)
Albumin ELP: 3.4 g/dL (ref 2.9–4.4)
Alpha 1: 0.3 g/dL (ref 0.0–0.4)
Alpha 2: 0.9 g/dL (ref 0.4–1.0)
Beta: 1.1 g/dL (ref 0.7–1.3)
Gamma Globulin: 1.1 g/dL (ref 0.4–1.8)
Globulin, Total: 3.4 g/dL (ref 2.2–3.9)
Total Protein: 6.8 g/dL (ref 6.0–8.5)

## 2020-10-30 LAB — ANA W/REFLEX: Anti Nuclear Antibody (ANA): NEGATIVE

## 2020-10-30 LAB — IMMUNOFIXATION, SERUM
IgA/Immunoglobulin A, Serum: 333 mg/dL (ref 64–422)
IgG (Immunoglobin G), Serum: 1069 mg/dL (ref 586–1602)
IgM (Immunoglobulin M), Srm: 85 mg/dL (ref 26–217)

## 2020-10-30 NOTE — Telephone Encounter (Signed)
Returned call to pt.  Pt scheduled for loop implant on November 21, 2020 at 3:15 pm for amaurosis fugax.  Gave CPT code for loop so she can call her insurance to find out copay.  She will call if she needs further assistance.

## 2020-10-30 NOTE — Telephone Encounter (Signed)
   Pt would like to know the name of the manufacturer of her loop recorder

## 2020-10-30 NOTE — Telephone Encounter (Signed)
    Pt is requesting to speak with Dr. Tanna Furry nurse, she said she has questions about procedure about an implant

## 2020-11-01 ENCOUNTER — Telehealth: Payer: Self-pay | Admitting: Interventional Cardiology

## 2020-11-01 NOTE — Telephone Encounter (Signed)
Patient is asking that Eliezer Lofts gives her a call back. She wants to cancel the machine because she doesn't know what the cost will be

## 2020-11-01 NOTE — Telephone Encounter (Signed)
Sorry, was this meant for pre-op?

## 2020-11-21 ENCOUNTER — Ambulatory Visit: Payer: Medicare Other | Admitting: Internal Medicine

## 2020-11-22 ENCOUNTER — Telehealth: Payer: Self-pay | Admitting: Cardiology

## 2020-11-22 NOTE — Telephone Encounter (Signed)
Agree with Dr Theodosia Blender rec.

## 2020-11-22 NOTE — Telephone Encounter (Signed)
Pt is following up late on telephone conversation she had with Dr. Johney Frame and myself on 7/18 about her medication regimen for BP coverage.  Pt was to call us back one week after the 7/18 call to report how she was feeling since taking coreg 6.25 mg po bid only and holding her losartan.  Pt is just now calling back about this.  Pt states she has been doing very well on coreg 6.25 bid and holding the losartan.  Pt states she has been off losartan since the 7/18 telephone call.  She states she has no more dizziness and her BPs stay [id number] systolic and [id number] diastolic.  Pt is asking since she is doing so well on just the coreg regimen, is Dr. Johney Frame okay with her still remaining off the losartan and just continuing the coreg?  Pt apologizes for now just giving [place] an update.  Advised the pt to continue her regimen as is and I will forward this message to Dr. Jacolyn Reedy covering Cardiologist and Pharmacist in BP clinic to further review and advise on if she should just continue with coreg, or restart losartan. Advised the pt to continue monitoring her numbers at home as well.  Pt verbalized understanding and agrees with this plan.

## 2020-11-22 NOTE — Telephone Encounter (Signed)
Supple, Harlon Flor, RPH-CPP     2:42 PM Note Agree with Dr Theodosia Blender rec.     Sueanne Margarita, MD to Me     2:34 PM  She does have DM so she needs to check with her PCP if they are ok with her staying off Losartan  Turner, Eber Hong, MD to Me     2:33 PM  I am fine with staying off Losartan

## 2020-11-22 NOTE — Telephone Encounter (Signed)
Pt c/o medication issue:  1. Name of Medication:  losartan (COZAAR) 25 MG tablet [id number]   2. How are you currently taking this medication (dosage and times per day)? Not currently taking   3. Are you having a reaction (difficulty breathing--STAT)? No   4. What is your medication issue? States she previously discussed with Dr. Johney Frame starting back on this medication at a half dose. Wants to discuss beginning it again.

## 2020-11-22 NOTE — Telephone Encounter (Signed)
Pt aware of recommendations per Dr Radford Pax and [place] PharmD. Pt states she will be going into see her PCP in a month for a physical, and will touch base with him then.   Pt verbalized understanding and agrees with this plan. Pt was more than gracious for all the assistance provided.

## 2020-11-30 ENCOUNTER — Ambulatory Visit: Payer: Medicare Other | Admitting: Physician Assistant

## 2020-12-10 ENCOUNTER — Ambulatory Visit: Payer: Medicare Other | Admitting: Podiatry

## 2021-02-14 ENCOUNTER — Other Ambulatory Visit: Payer: Self-pay

## 2021-02-14 ENCOUNTER — Ambulatory Visit (HOSPITAL_COMMUNITY): Payer: Medicare Other | Attending: Cardiology

## 2021-02-14 ENCOUNTER — Encounter: Payer: Self-pay | Admitting: Physician Assistant

## 2021-02-14 ENCOUNTER — Ambulatory Visit: Payer: Medicare Other | Admitting: Physician Assistant

## 2021-02-14 VITALS — BP 128/72 | HR 88 | Ht 63.0 in | Wt 139.0 lb

## 2021-02-14 DIAGNOSIS — I10 Essential (primary) hypertension: Secondary | ICD-10-CM

## 2021-02-14 DIAGNOSIS — Z952 Presence of prosthetic heart valve: Secondary | ICD-10-CM | POA: Diagnosis not present

## 2021-02-14 DIAGNOSIS — G453 Amaurosis fugax: Secondary | ICD-10-CM

## 2021-02-14 DIAGNOSIS — I6523 Occlusion and stenosis of bilateral carotid arteries: Secondary | ICD-10-CM | POA: Diagnosis not present

## 2021-02-14 DIAGNOSIS — I6310 Cerebral infarction due to embolism of unspecified precerebral artery: Secondary | ICD-10-CM

## 2021-02-14 LAB — ECHOCARDIOGRAM COMPLETE
AR max vel: 1.88 cm2
AV Area VTI: 2.2 cm2
AV Area mean vel: 2.21 cm2
AV Mean grad: 9 mmHg
AV Peak grad: 18.5 mmHg
Ao pk vel: 2.15 m/s
Area-P 1/2: 4.1 cm2
Height: 63 in
MV M vel: 5.71 m/s
MV Peak grad: 130.4 mmHg
MV VTI: 2.22 cm2
P 1/2 time: 204 msec
Radius: 0.5 cm
S' Lateral: 3 cm
Weight: 2224 oz

## 2021-02-14 NOTE — Progress Notes (Signed)
[facility]                                       Cardiology Office Note    Date:  02/14/2021   ID:  Cheryl Hernandez, DOB 09-04-1940, MRN [id number]  PCP:  [name], MD  Cardiologist: Dr. Johney Frame / Dr. Burt Knack & Dr. Roxy Manns (TAVR)  CC: 1 year s/p TAVR  History of Present Illness:  Cheryl Hernandez is a 80 y.o. 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   Patient has a history of NSTEMI s/p PCI to RCA in 2009. She was followed by Dr. [name] and noted  a 2-3 year hx of progressive exertional shortness of breath and fatigue. TTEs revealed presence of aortic stenosis that has gradually increased in severity. Diagnostic cath  was performed by Dr. 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 Catheterization also confirmed the presence of aortic stenosis with peak to peak and mean transvalvular gradients measured 33 and 29 mmHg respectively, corresponding to aortic valve area calculated only 0.81 cm.  Right-sided pressures were normal. Follow-up TTE 10/07/19 revealed paradoxical severe low-flow low gradient aortic stenosis.   She was evaluated by the multidisciplinary valve team and underwent successful TAVR with a 26 mm Medtronic Evolut Pro + THV via the TF approach on 12/20/19. Post operative TTE showed EF 70%, normally functioning TAVR with a mean gradient of 10.5 mmHg and no PVL. Mild-mod MR. Unfortunately, patient developed a persistent headache and blurry vision s/p TAVR. 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 She was seen by neurology and underwent full stroke work up. She  was discharged on aspirin and plavix. Also had a Zio patch which showed no atrial fibrillation. In follow up she complained of persistent visual issues and hallucinations. She was referred to Dr. Chauncy Passy with neuro psych. 1 month echo showed EF 65%, normally functioning TAVR with a mean gradient of 7 mm hg and new mild PVL.   On 05/02/20, patient had 19 beat run of SVT with HR 150-175 during cardiac rehab. Converted back to SR afterwards with HR 90s. Seen on 05/14/20 where she was doing overall well. She was having some daytime fatigue which she thought may have been related to her bystolic. We changed her to coreg. During our last visit on 06/28/20, we increased her coreg due to palpitations and rapid HR which were bothersome to her.   She was seen back in clinic on 08/30/20 for follow up and complained of white floaters and 2 episodes of the sensation of a "shade coming over her left eye" worrisome for amaurosis fugax. Of note these episodes occurred off plavix. She was put back on DAPT with aspirin and plavix and referred to Dr. Lovena Le for consideration of a loop and seen on 10/04/20. Plan was to repeat carotid dopplers prior to considering loop. This showed 1-39% RICA and 16-96% LICA stenosis. She was seen by Dr. Oneida Alar who offered her arch aortogram carotid angiogram for further evaluation as this would be considering looking for ulcerated plaque.  Other  potential imaging would be a CT angiogram but this would be less diagnostic for ulcerated plaque. Ultimately, continued medical management was decided with 1 year follow up. She was seen by neuro on 10/19/20 who agreed for evaluating for afib. Loop recorder insertion was planned for 11/21/20 but pt cancelled due to not knowing how much it might cost. She is now wearing an apple watch that monitor for afib.  Today the patient presents to clinic for follow up.  She is here alone.  She is doing well from a cardiac and neurological standpoint.  She has not had  any more episodes of visual disturbance since being put on Plavix.  She has been diligent with her apple watch that screens for atrial fibrillation.  She has not had any worrisome alerts.  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  Her biggest issue is chronic insomnia for which she only sleeps 2 to 4 hours a night despite taking Ambien prescribed by her PCP.  She feels constantly fatigued.  Otherwise she has no chest pain, shortness of breath, lower extremity edema, orthopnea or PND.  No dizziness or syncope.   Past Medical History:  Diagnosis Date   Arthritis    Asthma    CAD (coronary artery disease)    a. NSTEMI 2009 s/p DES to RCAx2.   Carotid artery disease ([place])    a. Mild-moderate - followed by [facility].   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 Burt Knack and Dr. Roxy Manns    Severe aortic stenosis     Past Surgical History:  Procedure Laterality Date   ANGIOPLASTY     CORONARY ANGIOPLASTY WITH STENT PLACEMENT     RIGHT/LEFT HEART CATH AND CORONARY ANGIOGRAPHY N/A 08/08/2019   Procedure: RIGHT/LEFT HEART CATH AND CORONARY ANGIOGRAPHY;  Surgeon: Belva Crome, MD;  Location: [facility];  Service: Cardiovascular;  Laterality: N/A;   TEE WITHOUT CARDIOVERSION N/A 12/20/2019   Procedure: TRANSESOPHAGEAL ECHOCARDIOGRAM (TEE);  Surgeon: Sherren Mocha, MD;  Location: [facility];  Service: Open Heart Surgery;   Laterality: N/A;   TONSILLECTOMY     TOTAL ABDOMINAL HYSTERECTOMY     TOTAL HIP ARTHROPLASTY     TRANSCATHETER AORTIC VALVE REPLACEMENT, TRANSFEMORAL N/A 12/20/2019   Procedure: TRANSCATHETER AORTIC VALVE REPLACEMENT, TRANSFEMORAL;  Surgeon: Sherren Mocha, MD;  Location: [facility];  Service: Open Heart Surgery;  Laterality: N/A;    Current Medications: Outpatient Medications Prior to Visit  Medication Sig Dispense Refill   acetaminophen (TYLENOL) 500 MG tablet Take 500 mg by mouth every 6 (six) hours as needed (for arthritis pain.).     albuterol (VENTOLIN HFA) 108 (90 Base) MCG/ACT inhaler Inhale 2 puffs into the lungs every 6 (six) hours as needed for wheezing or shortness of breath. 8 g 3  Alpha-D-Galactosidase (BEANO PO) Take 1 tablet by mouth daily as needed (flatulence/abdominal bloating.).     aspirin EC 81 MG tablet Take 81 mg by mouth every evening.     B Complex-C (B-COMPLEX WITH VITAMIN C) tablet Take 1 tablet by mouth daily.      budesonide (ENTOCORT EC) 3 MG 24 hr capsule Take 2 capsules (6 mg total) by mouth 2 (two) times daily as needed (diarrhea). 360 capsule 2   budesonide-formoterol (SYMBICORT) 80-4.5 MCG/ACT inhaler Inhale 2 puffs into the lungs 2 (two) times daily as needed (congestion). 10.2 g 5   calcium carbonate (TUMS - DOSED IN MG ELEMENTAL CALCIUM) 500 MG chewable tablet Chew 1 tablet by mouth 3 (three) times daily as needed for heartburn.      carvedilol (COREG) 6.25 MG tablet Take 1 tablet (6.25 mg total) by mouth 2 (two) times daily with a meal. 180 tablet 1   Cholecalciferol (VITAMIN D3) 75 MCG (3000 UT) TABS Take 3,000 Units by mouth every other day.     clopidogrel (PLAVIX) 75 MG tablet Take 1 tablet (75 mg total) by mouth daily. 90 tablet 3   docusate sodium (COLACE) 100 MG capsule daily.     Evolocumab (REPATHA SURECLICK) 017 MG/ML SOAJ INJECT 1 PEN INTO THE SKIN EVERY 14 DAYS 6 mL 3   fexofenadine (ALLEGRA) 180 MG tablet Take 180 mg by mouth  daily.     Glucose Blood (FREESTYLE LITE TEST VI) Once daily     guaifenesin (HUMIBID E) 400 MG TABS tablet Take 400 mg by mouth daily.     KRILL OIL PO Take 1 capsule by mouth daily.     losartan (COZAAR) 25 MG tablet TAKE 1 TABLET BY MOUTH DAILY 90 tablet 3   Magnesium Citrate 125 MG CAPS Take 125 mg by mouth daily.     metFORMIN (GLUCOPHAGE-XR) 500 MG 24 hr tablet Take 500 mg by mouth daily.     Multiple Vitamins-Minerals (MULTIVITAMIN WITH MINERALS) tablet Take 1 tablet by mouth every other day. In the morning     nitroGLYCERIN (NITROSTAT) 0.4 MG SL tablet Place 1 tablet (0.4 mg total) under the tongue every 5 (five) minutes as needed for chest pain. 25 tablet 3   Probiotic Product (ALIGN) 4 MG CAPS Take 4 mg by mouth every other day.     rosuvastatin (CRESTOR) 5 MG tablet      Turmeric 400 MG CAPS Take by mouth daily.     zolpidem (AMBIEN) 5 MG tablet Take 5 mg by mouth at bedtime as needed for sleep.     mupirocin ointment (BACTROBAN) 2 % Apply to both great toes once daily until healed. (Patient not taking: Reported on 02/14/2021) 30 g 0   No facility-administered medications prior to visit.     Allergies:   Amlodipine, Cefuroxime axetil, Cephalexin, Codeine, Doxycycline, Erythromycin base, Linaclotide, Statins, and Telmisartan   Social History   Socioeconomic History   Marital status: Widowed    Spouse name: Not on file   Number of children: Not on file   Years of education: Not on file   Highest education level: Not on file  Occupational History   Occupation: retired Therapist, sports  Tobacco Use   Smoking status: Former    Packs/day: 1.00    Years: 30.00    Pack years: 30.00    Types: Cigarettes    Quit date: 03/31/1998    Years since quitting: 22.8   Smokeless tobacco: Never   Tobacco comments:  Quit intermittently since her 69s.  Vaping Use   Vaping Use: Never used  Substance and Sexual Activity   Alcohol use: Yes    Alcohol/week: 0.0 standard drinks    Comment: Rare.    Drug use: No   Sexual activity: Not on file  Other Topics Concern   Not on file  Social History Narrative   Originally from [place]. She has traveled to [place], [place], [place], & [place]. Previously worked as a Marine scientist. No known TB exposure. No pets currently. No bird, hot tube, or asbestos exposure. Enjoys playing bridge.    Social Determinants of Health   Financial Resource Strain: Not on file  Food Insecurity: Not on file  Transportation Needs: Not on file  Physical Activity: Not on file  Stress: Not on file  Social Connections: Not on file     Family History:  The patient's family history includes Allergies in her mother; Heart attack in her father; Hyperlipidemia in her mother; Prostate cancer in her maternal grandfather.     ROS:   Please see the history of present illness.    ROS All other systems reviewed and are negative.   PHYSICAL EXAM:   VS:  BP 128/72   Pulse 88   Ht 5\' 3"  (1.6 m)   Wt 139 lb (63 kg)   SpO2 96%   BMI 24.62 kg/m    GEN: Well nourished, well developed, in no acute distress HEENT: normal Neck: no JVD or masses Cardiac: RRR; soft flow murmur. No rubs, or gallops,no edema  Respiratory:  clear to auscultation bilaterally, normal work of breathing GI: soft, nontender, nondistended, + BS MS: no deformity or atrophy Skin: warm and dry, no rash.  Neuro:  Alert and Oriented x 3, Strength and sensation are intact Psych: euthymic mood, full affect   Wt Readings from Last 3 Encounters:  02/14/21 139 lb (63 kg)  10/19/20 137 lb (62.1 kg)  10/18/20 137 lb (62.1 kg)      Studies/Labs Reviewed:   EKG:  EKG is NOT ordered today.    Recent Labs: 03/02/2020: BUN 16; Creatinine, Ser 0.72; Potassium 4.6; Sodium 139 10/19/2020: TSH 0.99   Lipid Panel    Component Value Date/Time   CHOL 157 12/22/2019 0841   CHOL 150 07/04/2019 0749   TRIG 151 (H) 12/22/2019 0841   HDL 67 12/22/2019 0841   HDL 68 07/04/2019 0749   CHOLHDL 2.3 12/22/2019 0841   VLDL 30 12/22/2019  0841   LDLCALC 60 12/22/2019 0841   LDLCALC 64 07/04/2019 0749    Additional studies/ records that were reviewed today include:  TAVR OPERATIVE NOTE     Date of Procedure:                12/20/2019   Preoperative Diagnosis:      Severe Aortic Stenosis    Postoperative Diagnosis:    Same    Procedure:        Transcatheter Aortic Valve Replacement - Percutaneous Right Transfemoral Approach             Medtronic CoreValve Evolut Pro (size 26 mm, serial # [id number])              Co-Surgeons:                        Valentina Gu. Roxy Manns, MD and Sherren Mocha, MD   Anesthesiologist:  Belinda Block, MD   Echocardiographer:              Sanda Klein, MD   Pre-operative Echo Findings: Severe aortic stenosis Normal left ventricular systolic function   Post-operative Echo Findings: No paravalvular leak Normal left ventricular systolic function   _____________   Echo 12/21/19:  IMPRESSIONS   1. A 3.5 m/s intracavitary "gradient" is present due to hyperdynamic left  ventricular contraction. Left ventricular ejection fraction, by  estimation, is 70 to 75%. The left ventricle has hyperdynamic function.  The left ventricle has no regional wall  motion abnormalities. Left ventricular diastolic parameters are consistent  with Grade II diastolic dysfunction (pseudonormalization). Elevated left  atrial pressure.   2. Right ventricular systolic function is normal. The right ventricular  size is normal. There is mildly elevated pulmonary artery systolic  pressure.   3. The mitral valve is normal in structure. Mild to moderate mitral valve  regurgitation. No evidence of mitral stenosis. Moderate mitral annular  calcification.   4. The aortic valve has been repaired/replaced. Aortic valve  regurgitation is not visualized. No aortic stenosis is present. There is a  CoreValve-Evolut Pro prosthetic (TAVR) valve present in the aortic  position. Procedure Date: 12/20/2019.   5.  The inferior vena cava is normal in size with greater than 50%  respiratory variability, suggesting right atrial pressure of 3 mmHg.    __________________  ZIo AT 12/22/19 Study Highlights  Basic rhythm is NSR Non Sustained SVT at rates 115 to 214 bpm. Longest 11 seconds No atrial fibrillation SVT burden < 1%   _________________  Echo 01/26/20 IMPRESSIONS   1. 26 mm Evolute Pro. Vmax 1.74 m/s, MG 7.2 mmHG, EOA 2.33 cm2, DI 0.74.  Mild paravalvular leak is the 5-6 o'clock position. The aortic valve has  been repaired/replaced. Aortic valve regurgitation is mild. There is a 26  mm CoreValve-Evolut Pro  prosthetic (TAVR) valve present in the aortic position. Procedure Date:  12/20/2019.   2. Left ventricular ejection fraction, by estimation, is 65 to 70%. The  left ventricle has normal function. The left ventricle has no regional  wall motion abnormalities. Left ventricular diastolic function could not  be evaluated.   3. Right ventricular systolic function is normal. The right ventricular  size is normal. There is normal pulmonary artery systolic pressure. The  estimated right ventricular systolic pressure is 29.5 mmHg.   4. Left atrial size was mildly dilated.   5. Mild calcific mitral stenosis (degnerative). MVA by VTI 2.2 cm2. The  mitral valve is degenerative. Mild mitral valve regurgitation. Mild mitral  stenosis. The mean mitral valve gradient is 6.6 mmHg with average heart  rate of 83 bpm. Moderate to severe   mitral annular calcification.   6. The inferior vena cava is normal in size with greater than 50%  respiratory variability, suggesting right atrial pressure of 3 mmHg.   Comparison(s): Changes from prior study are noted. Mild PVL is now  present.   _______________________  Echo 02/14/21 IMPRESSIONS  1. Left ventricular ejection fraction, by estimation, is 60 to 65%. The left ventricle has normal function. The left ventricle has no regional wall motion  abnormalities. Left ventricular diastolic parameters are consistent with Grade II diastolic  dysfunction (pseudonormalization).  2. Right ventricular systolic function is normal. The right ventricular size is normal. There is normal pulmonary artery systolic pressure. The estimated right ventricular systolic pressure is 28.4 mmHg.  3. The mitral valve is degenerative. Moderate mitral valve regurgitation. The  mean mitral valve gradient is 7.0 mmHg but MVA by VTI is 2.2 cm^2, suspect no more than mild mitral stenosis with elevated gradient due to MR and high flow.  4. Bioprosthetic aortic valve s/p TAVR with 26 mm Medtronic Evolut Pro THV. Mean gradient 11 mmHg. There was mild peri-valvular leakage.  5. Left atrial size was mildly dilated.  6. The inferior vena cava is normal in size with greater than 50% respiratory variability, suggesting right atrial pressure of 3 mmHg.    ASSESSMENT & PLAN:   Severe AS s/p TAVR: Severe AS s/p TAVR: echo today showed EF 60%, normally functioning TAVR with a mean gradient of 8 mmHg and mild PVL. There is mod MR/mild MS and mild TR. She has NYHA class I symptoms.  She does have fatigue, but I think this is related to her chronic insomnia.  SBE prophylaxis discussed; she has amoxicillin.  She will continue on indefinite aspirin and Plavix given recurrent TIAs off of Plavix.   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  Carotid artery disease: most recent dopplers showed 25-00% LICA stenosis. Continue antiplatelet therapy. Intolerant to statins. On Repatha. Followed by Dr. Carlis Abbott.   HTN: BP well controlled today.  No changes made.  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  They have recommended continued DAPT as well as atrial fibrillation monitoring.  She is wearing an apple watch that screens her for A. fib.  She has declined a loop recorder due to not knowing how much it might cost.  Medication Adjustments/Labs and Tests  Ordered: Current medicines are reviewed at length with the patient today.  Concerns regarding medicines are outlined above.  Medication changes, Labs and Tests ordered today are listed in the Patient Instructions below. Patient Instructions  Medication Instructions:  Your physician recommends that you continue on your current medications as directed. Please refer to the Current Medication list given to you today.  *If you need a refill on your cardiac medications before your next appointment, please call your pharmacy*   Lab Work: None ordered   If you have labs (blood work) drawn today and your tests are completely normal, you will receive your results only by: [place] (if you have MyChart) OR A paper copy in the mail If you have any lab test that is abnormal or we need to change your treatment, we will call you to review the results.   Testing/Procedures: None ordered    Follow-Up: Follow up as scheduled  Follow up :1}  Other Instructions None      Signed, Angelena Form, PA-C  02/14/2021 3:59 PM    [facility] [place], [place], [place]  [address] Phone: [phone]; Fax: [phone]

## 2021-02-14 NOTE — Patient Instructions (Signed)
Medication Instructions:  Your physician recommends that you continue on your current medications as directed. Please refer to the Current Medication list given to you today.  *If you need a refill on your cardiac medications before your next appointment, please call your pharmacy*   Lab Work: None ordered   If you have labs (blood work) drawn today and your tests are completely normal, you will receive your results only by: [place] (if you have MyChart) OR A paper copy in the mail If you have any lab test that is abnormal or we need to change your treatment, we will call you to review the results.   Testing/Procedures: None ordered    Follow-Up: Follow up as scheduled  Follow up :1}  Other Instructions None

## 2021-03-21 ENCOUNTER — Other Ambulatory Visit: Payer: Self-pay

## 2021-03-21 DIAGNOSIS — I25119 Atherosclerotic heart disease of native coronary artery with unspecified angina pectoris: Secondary | ICD-10-CM

## 2021-03-21 MED ORDER — CARVEDILOL 6.25 MG PO TABS: 6.2500 mg | ORAL_TABLET | Freq: Two times a day (BID) | ORAL | 3 refills | Status: DC

## 2021-04-10 ENCOUNTER — Telehealth: Payer: Self-pay | Admitting: Cardiology

## 2021-04-10 ENCOUNTER — Other Ambulatory Visit: Payer: Self-pay | Admitting: Interventional Cardiology

## 2021-04-10 NOTE — Telephone Encounter (Signed)
°  She wants to make sure the [employer] is going to cover her Repatha, before she orders a refill.   She states she didn't have to apply last year, so she is assuming it's the same this year.

## 2021-04-10 NOTE — Telephone Encounter (Signed)
Called and provided the [employer] approval info to the pt. They voiced gratitude and understanding Pharmacy Card CARD NO. [id number]   CARD STATUS Active   BIN [id number]   PCN [id number]   PC GROUP [id number]   HELP DESK [phone]   PROVIDER PDMI   PROCESSOR PDMI

## 2021-04-10 NOTE — Telephone Encounter (Signed)
Pt's [employer] expired in December, can you re-enroll her please?

## 2021-04-27 NOTE — Progress Notes (Deleted)
Cardiology Office Note:    Date:  04/27/2021   ID:  [name], [name] [birth year], MRN [id number]  PCP:  [name], MD   [place]  Cardiologist:  Sinclair Grooms, MD  Advanced Practice Provider:  No care team member to display Electrophysiologist:  None   Referring MD: [name], MD    History of Present Illness:    Cheryl Hernandez is a 81 y.o. 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   Patient has a history of NSTEMI s/p PCI to RCA in 2009. She was followed by Dr. [name] and noted  a 2-3 year hx of progressive exertional shortness of breath and fatigue. TTEs revealed presence of aortic stenosis that has gradually increased in severity. Diagnostic cath  was performed by Dr. 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 Catheterization also confirmed the presence of aortic stenosis with peak to peak and mean transvalvular gradients measured 33 and 29 mmHg respectively, corresponding to aortic valve area calculated only 0.81 cm.  Right-sided pressures were normal. Follow-up TTE 10/07/19 revealed paradoxical severe low-flow low gradient aortic stenosis.   She was evaluated by the multidisciplinary valve team and underwent successful TAVR with a 26 mm Medtronic Evolut Pro + THV via the TF approach on 12/20/19. Post operative TTE showed EF 70%, normally functioning TAVR with a mean gradient of 10.5 mmHg and no PVL. Mild-mod MR. Unfortunately, patient developed a persistent headache and blurry vision s/p TAVR. 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 She was seen by neurology and underwent full  stroke work up. She was discharged on aspirin and plavix. Also had a Zio patch which showed no atrial fibrillation. In follow up she complained of persistent visual issues and hallucinations. She was referred to Dr. Chauncy Passy with neuro psych.   On 05/02/20, patient had 19 beat run of SVT with HR 150-175 during cardiac rehab. Converted back to SR afterwards with HR 90s. Seen on 05/14/20 where she was doing overall well. She was having some daytime fatigue which she thought may have been related to her bystolic. We changed her to coreg. During our last visit on 06/28/20, we increased her coreg due to palpitations and rapid HR which were bothersome to her.  She was seen back in clinic on 08/30/20 for follow up and complained of white floaters and 2 episodes of the sensation of a "shade coming over her left eye" worrisome for amaurosis fugax. Of note these episodes occurred off plavix. She was put back on DAPT with aspirin and plavix and referred to Dr. Lovena Le for consideration of a loop and seen on 10/04/20. Plan was to repeat carotid dopplers prior to considering loop. This showed 1-39% RICA and 26-83% LICA stenosis. She was seen by Dr. Oneida Alar who offered her arch aortogram carotid angiogram for further evaluation as this would be considering looking for ulcerated plaque.  Other potential imaging would be a CT angiogram but this would be less diagnostic for ulcerated plaque. Ultimately, continued medical management was decided with 1 year follow up. She was seen by neuro on 10/19/20 who agreed for evaluating for afib. Loop recorder insertion was planned for 11/21/20  but pt cancelled due to not knowing how much it might cost. She is now wearing an apple watch that monitor for afib.  Last seen in clinic by Cheryl Hernandez on 01/2021 where she was doing well other than insomnia.  Today, ***  Past Medical History:  Diagnosis Date   Arthritis    Asthma    CAD (coronary artery disease)    a. NSTEMI 2009 s/p DES  to RCAx2.   Carotid artery disease ([place])    a. Mild-moderate - followed by [facility].   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 Burt Knack and Dr. Roxy Manns    Severe aortic stenosis     Past Surgical History:  Procedure Laterality Date   ANGIOPLASTY     CORONARY ANGIOPLASTY WITH STENT PLACEMENT     RIGHT/LEFT HEART CATH AND CORONARY ANGIOGRAPHY N/A 08/08/2019   Procedure: RIGHT/LEFT HEART CATH AND CORONARY ANGIOGRAPHY;  Surgeon: Belva Crome, MD;  Location: [place] [facility];  Service: Cardiovascular;  Laterality: N/A;   TEE WITHOUT CARDIOVERSION N/A 12/20/2019   Procedure: TRANSESOPHAGEAL ECHOCARDIOGRAM (TEE);  Surgeon: Sherren Mocha, MD;  Location: [place] [facility];  Service: Open Heart Surgery;  Laterality: N/A;   TONSILLECTOMY     TOTAL ABDOMINAL HYSTERECTOMY     TOTAL HIP ARTHROPLASTY     TRANSCATHETER AORTIC VALVE REPLACEMENT, TRANSFEMORAL N/A 12/20/2019   Procedure: TRANSCATHETER AORTIC VALVE REPLACEMENT, TRANSFEMORAL;  Surgeon: Sherren Mocha, MD;  Location: [place] [facility];  Service: Open Heart Surgery;  Laterality: N/A;    Current Medications: No outpatient medications have been marked as taking for the 05/01/21 encounter (Appointment) with Freada Bergeron, MD.     Allergies:   Amlodipine, Cefuroxime axetil, Cephalexin, Codeine, Doxycycline, Erythromycin base, Linaclotide, Statins, and Telmisartan   Social History   Socioeconomic  History   Marital status: Widowed    Spouse name: Not on file   Number of children: Not on file   Years of education: Not on file   Highest education level: Not on file  Occupational History   Occupation: retired Therapist, sports  Tobacco Use   Smoking status: Former    Packs/day: 1.00    Years: 30.00    Pack years: 30.00    Types: Cigarettes    Quit date: 03/31/1998    Years since quitting: 23.0   Smokeless tobacco: Never   Tobacco comments:    Quit intermittently since her 11s.  Vaping Use   Vaping Use: Never used  Substance and Sexual Activity   Alcohol use: Yes    Alcohol/week: 0.0 standard drinks    Comment: Rare.   Drug use: No   Sexual activity: Not on file  Other Topics Concern   Not on file  Social History Narrative   Originally from [place]. She has traveled to [place], [place],  [place], & [place]. Previously worked as a Marine scientist. No known TB exposure. No pets currently. No bird, hot tube, or asbestos exposure. Enjoys playing bridge.    Social Determinants of Health   Financial Resource Strain: Not on file  Food Insecurity: Not on file  Transportation Needs: Not on file  Physical Activity: Not on file  Stress: Not on file  Social Connections: Not on file     Family History: The patient's family history includes Allergies in her mother; Heart attack in her father; Hyperlipidemia in her mother; Prostate cancer in her maternal grandfather. There is no history of Lung disease.  ROS:   Please see the history of present illness.    Review of Systems  Constitutional:  Negative for chills, fever and malaise/fatigue.  HENT:  Negative for hearing loss.   Eyes:  Positive for blurred vision. Negative for redness.  Respiratory:  Negative for shortness of breath.   Cardiovascular:  Negative for chest pain, palpitations, orthopnea, claudication, leg swelling and PND.  Gastrointestinal:  Negative for melena, nausea and vomiting.  Genitourinary:  Negative for flank pain.  Musculoskeletal:  Negative for  joint pain and myalgias.  Neurological:  Negative for dizziness and loss of consciousness.  Endo/Heme/Allergies:  Negative for polydipsia.  Psychiatric/Behavioral:  Positive for hallucinations. Negative for substance abuse.    EKGs/Labs/Other Studies Reviewed:    The following studies were reviewed today: TAVR OPERATIVE NOTE     Date of Procedure:                12/20/2019   Preoperative Diagnosis:      Severe Aortic Stenosis    Postoperative Diagnosis:    Same    Procedure:        Transcatheter Aortic Valve Replacement - Percutaneous Right Transfemoral Approach             Medtronic CoreValve Evolut Pro (size 26 mm, serial # [id number])              Co-Surgeons:                        Valentina Gu. Roxy Manns, MD and Sherren Mocha, MD   Anesthesiologist:                  Belinda Block, MD   Echocardiographer:              Sanda Klein, MD   Pre-operative Echo Findings: Severe aortic stenosis Normal left ventricular systolic function   Post-operative Echo Findings: No paravalvular leak Normal left ventricular systolic function   _____________   Cath 08/08/19: Moderate aortic stenosis with peak to peak gradient of 33 mmHg, mean gradient 29 mmHg, and calculated aortic valve area of 0.81 cm. Normal pulmonary pressures with mean arterial pressure of 23 mmHg. 3 mmHg gradient across the mitral valve Normal left ventricular systolic function with EF 65%. Widely patent left main Proximal LAD saccular aneurysm not substantially different in appearance than 2006 and 2012 angiogram comparisons.  Luminal irregularities are noted throughout the LAD territory. Luminal irregularities noted throughout the circumflex. The RCA is dominant, tortuous, and contains a patent mid to distal stent beyond several acute bends.  Diffuse luminal irregularities are noted otherwise. Scattered significant calcification is noted in the coronary arterial tree, the thoracic aorta, and mitral annulus.    RECOMMENDATIONS:   There is discordance between aortic stenosis and coronary disease severity and symptoms.  Dyspnea on exertion may be multifactorial. LAD aneurysm is stable.  Consider referral to aortic valve clinic for an opinion. May need pulmonary evaluation. May need transesophageal echo to exclude the possibility of mitral valve disease being more significant than suggested by current data.     Echo 12/21/19:  IMPRESSIONS   1. A 3.5 m/s intracavitary "gradient" is present due to hyperdynamic left  ventricular contraction. Left ventricular ejection fraction, by  estimation, is 70 to 75%. The left ventricle has hyperdynamic function.  The left ventricle has no regional wall  motion abnormalities. Left ventricular diastolic parameters are consistent  with Grade II diastolic dysfunction (pseudonormalization). Elevated left  atrial pressure.   2. Right ventricular systolic function is normal. The right ventricular  size is normal. There is mildly elevated pulmonary artery systolic  pressure.   3. The mitral valve is normal in structure. Mild to moderate mitral valve  regurgitation. No evidence of mitral stenosis. Moderate mitral annular  calcification.   4. The aortic valve has been repaired/replaced. Aortic valve  regurgitation is not visualized. No aortic stenosis is present. There is a  CoreValve-Evolut Pro prosthetic (TAVR) valve present in the aortic  position. Procedure Date: 12/20/2019.   5. The inferior vena cava is normal in size with greater than 50%  respiratory variability, suggesting right atrial pressure of 3 mmHg.    __________________   ZIo AT 12/22/19 Study Highlights   Basic rhythm is NSR Non Sustained SVT at rates 115 to 214 bpm. Longest 11 seconds No atrial fibrillation SVT burden < 1%   _________________   Echo 01/26/20 IMPRESSIONS   1. 26 mm Evolute Pro. Vmax 1.74 m/s, MG 7.2 mmHG, EOA 2.33 cm2, DI 0.74.  Mild paravalvular leak is the 5-6 o'clock  position. The aortic valve has  been repaired/replaced. Aortic valve regurgitation is mild. There is a 26  mm CoreValve-Evolut Pro  prosthetic (TAVR) valve present in the aortic position. Procedure Date:  12/20/2019.   2. Left ventricular ejection fraction, by estimation, is 65 to 70%. The  left ventricle has normal function. The left ventricle has no regional  wall motion abnormalities. Left ventricular diastolic function could not  be evaluated.   3. Right ventricular systolic function is normal. The right ventricular  size is normal. There is normal pulmonary artery systolic pressure. The  estimated right ventricular systolic pressure is 81.8 mmHg.   4. Left atrial size was mildly dilated.   5. Mild calcific mitral stenosis (degnerative). MVA by VTI 2.2 cm2. The  mitral valve is degenerative. Mild mitral valve regurgitation. Mild mitral  stenosis. The mean mitral valve gradient is 6.6 mmHg with average heart  rate of 83 bpm. Moderate to severe   mitral annular calcification.   6. The inferior vena cava is normal in size with greater than 50%  respiratory variability, suggesting right atrial pressure of 3 mmHg.   Comparison(s): Changes from prior study are noted. Mild PVL is now  present.    EKG: 08/30/2020: EKG is not ordered today. 06/28/20: NSR with HR 94  Recent Labs: 10/19/2020: TSH 0.99  Recent Lipid Panel    Component Value Date/Time   CHOL 157 12/22/2019 0841   CHOL 150 07/04/2019 0749   TRIG 151 (H) 12/22/2019 0841   HDL 67 12/22/2019 0841   HDL 68 07/04/2019 0749   CHOLHDL 2.3 12/22/2019 0841   VLDL 30 12/22/2019 0841   LDLCALC 60 12/22/2019 0841   LDLCALC 64 07/04/2019 0749     Risk Assessment/Calculations:       Physical Exam:  VS:  There were no vitals taken for this visit.    Wt Readings from Last 3 Encounters:  02/14/21 139 lb (63 kg)  10/19/20 137 lb (62.1 kg)  10/18/20 137 lb (62.1 kg)     GEN:  Well nourished, well developed in no acute  distress HEENT: Normal NECK: No JVD; No carotid bruits CARDIAC: RRR, 2/6 early-peaking systolic murmur, no rubs, no gallops RESPIRATORY:  Clear to auscultation without rales, wheezing or rhonchi  ABDOMEN: Soft, non-tender, non-distended MUSCULOSKELETAL:  No edema; No deformity  SKIN: Warm and dry NEUROLOGIC:  Alert and oriented x 3 PSYCHIATRIC:  Normal affect   ASSESSMENT:    No diagnosis found.  PLAN:    In order of problems listed above:  #Concern for Amaurosis Fugax: Patient with 2 episodes of the sensation of a "shade coming over her left eye" that lasted for several hours before resolving. Symptoms highly concerning for amaurosis fugax. Multiple monitors with no evidence of Afib, however, patient has frequent palpitations. TTE 01/2021 with EF 60%, normally functioning TAVR with a mean gradient of 8 mmHg and mild PVL. There is mod MR/mild MS and mild TR. Given concern for amaurosis fugax, we referred for loop but patient declined due to cost. Now monitoring with apple watch.  -Continue apple watch -Continue plavix 90m daily for now  -Continue ASA 852mdaily -Continue repatha 14035m2weeks  #SVT: Patient with 19 beat episode of SVT on monitor while at cardiac rehab. Has known history of SVT. Recent monitor with no episodes of Afib but some runs of SVT. Now monitoring with apple watch. -Continue coreg 6.67m66mD  #Severe, paradoxical AS s/p TAVR 12/20/19: TTE with LVEF 65% with normal functioning TAVR with mean gradient 7mmH47mnd mild PVL. NYHA class II symptoms. Doing well.  -Follow-up with structural heart team as scheduled -Continue ASA 81mg 39my -Continue plavix 75mg d12m as above -SBE ppx prescribed  #Mild MS #Severe MAC: -Continue coreg 6.67mg BI19merial TTEs for monitoring   #Post-Op CVA: Symptoms resolved.  -Continue ASA 81mg dai38mnd plavix 75mg dail68m above   #Carotid Artery Disease: Most recent dopplers showed 40-59% LIC06-23%osis.  -Continue ASA  81mg daily76m plavix 75mg daily 48mbove -On repatha 140mg q2weeks34mCAD s/p RCA PCI in the past: No anginal symptoms. -Continue ASA 81mg daily an28mavix 75mg daily as 45me -Continue coreg 6.67mg BID -Conti51mlosartan 67mg daily   #HT37mLosartan 67mg daily -Conti82mcoreg 6.67mg BID     Medic56mn Adjustments/Labs and Tests Ordered: Current medicines are reviewed at length with the patient today.  Concerns regarding medicines are outlined above.  No orders of the defined types were placed in this encounter.  No orders of the defined types were placed in this encounter.   There are no Patient Instructions on file for this visit.   I,Mathew Stumpf,acting as a scribe for Delois Tolbert Education administratorertonFreada Bergeroned all relevant documentation on the behalf of Chidera Dearcos E PembertonFreada Bergeron  Jashawna Reever E PembertonFreada Bergeronresence of Mahogani Holohan E PembertonFreada Bergeron PembertonFreada Bergeron all documentation for this visit. The documentation on 04/27/21 for the exam, diagnosis, procedures, and orders are all accurate and complete.  Signed, Tirza Senteno E PembertonFreada Bergeron8 PM    [facility]

## 2021-05-01 ENCOUNTER — Other Ambulatory Visit: Payer: Self-pay

## 2021-05-01 ENCOUNTER — Ambulatory Visit: Payer: Medicare Other | Admitting: Cardiology

## 2021-05-01 ENCOUNTER — Encounter: Payer: Self-pay | Admitting: Cardiology

## 2021-05-01 VITALS — BP 110/70 | HR 84 | Ht 63.0 in | Wt 140.0 lb

## 2021-05-01 DIAGNOSIS — I10 Essential (primary) hypertension: Secondary | ICD-10-CM

## 2021-05-01 DIAGNOSIS — I350 Nonrheumatic aortic (valve) stenosis: Secondary | ICD-10-CM | POA: Diagnosis not present

## 2021-05-01 DIAGNOSIS — Z952 Presence of prosthetic heart valve: Secondary | ICD-10-CM

## 2021-05-01 DIAGNOSIS — I25119 Atherosclerotic heart disease of native coronary artery with unspecified angina pectoris: Secondary | ICD-10-CM

## 2021-05-01 DIAGNOSIS — I471 Supraventricular tachycardia: Secondary | ICD-10-CM

## 2021-05-01 DIAGNOSIS — I6523 Occlusion and stenosis of bilateral carotid arteries: Secondary | ICD-10-CM

## 2021-05-01 DIAGNOSIS — G453 Amaurosis fugax: Secondary | ICD-10-CM

## 2021-05-01 DIAGNOSIS — I050 Rheumatic mitral stenosis: Secondary | ICD-10-CM

## 2021-05-01 NOTE — Patient Instructions (Signed)
Medication Instructions:   Your physician recommends that you continue on your current medications as directed. Please refer to the Current Medication list given to you today.  *If you need a refill on your cardiac medications before your next appointment, please call your pharmacy*   Follow-Up: At [facility], you and your health needs are our priority.  As part of our continuing mission to provide you with exceptional heart care, we have created designated Provider Care Teams.  These Care Teams include your primary Cardiologist (physician) and Advanced Practice Providers (APPs -  Physician Assistants and Nurse Practitioners) who all work together to provide you with the care you need, when you need it.  We recommend signing up for the patient portal called "MyChart".  Sign up information is provided on this After Visit Summary.  MyChart is used to connect with patients for Virtual Visits (Telemedicine).  Patients are able to view lab/test results, encounter notes, upcoming appointments, etc.  Non-urgent messages can be sent to your provider as well.   To learn more about what you can do with MyChart, go to [email or web address].    Your next appointment:   6 month(s)  The format for your next appointment:   In Person  Provider:   Dr. Johney Frame OR AN EXTENDER

## 2021-05-01 NOTE — Progress Notes (Signed)
Cardiology Office Note:    Date:  05/01/2021   ID:  Cheryl Hernandez, DOB 1940-06-06, MRN [id number]  PCP:  [name], MD   [place]  Cardiologist:  Sinclair Grooms, MD  Advanced Practice Provider:  No care team member to display Electrophysiologist:  None   Referring MD: [name], MD    History of Present Illness:    Cheryl Hernandez is a 81 y.o. 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   Patient has a history of NSTEMI s/p PCI to RCA in 2009. She was followed by Dr. [name] and noted  a 2-3 year hx of progressive exertional shortness of breath and fatigue. TTEs revealed presence of aortic stenosis that has gradually increased in severity. Diagnostic cath  was performed by Dr. 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 Catheterization also confirmed the presence of aortic stenosis with peak to peak and mean transvalvular gradients measured 33 and 29 mmHg respectively, corresponding to aortic valve area calculated only 0.81 cm.  Right-sided pressures were normal. Follow-up TTE 10/07/19 revealed paradoxical severe low-flow low gradient aortic stenosis.   She was evaluated by the multidisciplinary valve team and underwent successful TAVR with a 26 mm Medtronic Evolut Pro + THV via the TF approach on 12/20/19. Post operative TTE showed EF 70%, normally functioning TAVR with a mean gradient of 10.5 mmHg and no PVL. Mild-mod MR. Unfortunately, patient developed a persistent headache and blurry vision s/p TAVR. 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 She was seen by neurology and underwent full  stroke work up. She was discharged on aspirin and plavix. Also had a Zio patch which showed no atrial fibrillation. In follow up she complained of persistent visual issues and hallucinations. She was referred to Dr. Chauncy Passy with neuro psych.   On 05/02/20, patient had 19 beat run of SVT with HR 150-175 during cardiac rehab. Converted back to SR afterwards with HR 90s. Seen on 05/14/20 where she was doing overall well. She was having some daytime fatigue which she thought may have been related to her bystolic. We changed her to coreg. During our last visit on 06/28/20, we increased her coreg due to palpitations and rapid HR which were bothersome to her.  She was seen back in clinic on 08/30/20 for follow up and complained of white floaters and 2 episodes of the sensation of a "shade coming over her left eye" worrisome for amaurosis fugax. Of note these episodes occurred off plavix. She was put back on DAPT with aspirin and plavix and referred to Dr. Lovena Le for consideration of a loop and seen on 10/04/20. Plan was to repeat carotid dopplers prior to considering loop. This showed 1-39% RICA and 30-09% LICA stenosis. She was seen by Dr. Oneida Alar who offered her arch aortogram carotid angiogram for further evaluation as this would be considering looking for ulcerated plaque.  Other potential imaging would be a CT angiogram but this would be less diagnostic for ulcerated plaque. Ultimately, continued medical management was decided with 1 year follow up. She was seen by neuro on 10/19/20 who agreed for evaluating for afib. Loop recorder insertion was planned for 11/21/20  but pt cancelled due to not knowing how much it might cost. She is now wearing an apple watch that monitor for afib.  Last seen in clinic by Nell Range on 01/2021 where she was doing well other than insomnia.  Today, the patient states that she is feeling fine. Lately her energy levels are increasing. She still becomes short of breath associated  with elevated heart rates. She continues to do well on carvedilol.   At one time, her smart watch detected a short episode of SVT, but she did not feel this. Otherwise no significant arrhythmias.  Sometimes she suffers from epistaxis, which she attributes to the low humidity.  She denies any chest pain, lightheadedness, headaches, syncope, orthopnea, PND, or lower extremity edema.  On occasion she will notice a few eye floaters, but her vision is generally improved. Insomnia is significantly improved.   Past Medical History:  Diagnosis Date   Arthritis    Asthma    CAD (coronary artery disease)    a. NSTEMI 2009 s/p DES to RCAx2.   Carotid artery disease ([place])    a. Mild-moderate - followed by [facility].   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 Burt Knack and Dr. Roxy Manns    Severe aortic stenosis     Past Surgical History:  Procedure Laterality Date   ANGIOPLASTY     CORONARY ANGIOPLASTY WITH STENT PLACEMENT     RIGHT/LEFT HEART CATH AND CORONARY ANGIOGRAPHY N/A 08/08/2019   Procedure: RIGHT/LEFT HEART CATH AND CORONARY ANGIOGRAPHY;  Surgeon: Belva Crome, MD;  Location: [place] [facility];  Service: Cardiovascular;  Laterality: N/A;   TEE WITHOUT CARDIOVERSION N/A 12/20/2019   Procedure: TRANSESOPHAGEAL ECHOCARDIOGRAM (TEE);  Surgeon: Sherren Mocha, MD;  Location: [place] [facility];  Service: Open Heart Surgery;  Laterality: N/A;   TONSILLECTOMY      TOTAL ABDOMINAL HYSTERECTOMY     TOTAL HIP ARTHROPLASTY     TRANSCATHETER AORTIC VALVE REPLACEMENT, TRANSFEMORAL N/A 12/20/2019   Procedure: TRANSCATHETER AORTIC VALVE REPLACEMENT, TRANSFEMORAL;  Surgeon: Sherren Mocha, MD;  Location: [place] [facility];  Service: Open Heart Surgery;  Laterality: N/A;    Current Medications: Current Meds  Medication Sig   acetaminophen (TYLENOL) 500 MG tablet Take 500 mg by mouth every 6 (six) hours as needed (for arthritis pain.).   albuterol (VENTOLIN HFA) 108 (90 Base) MCG/ACT inhaler Inhale 2 puffs into the lungs every 6 (six) hours as needed for wheezing or shortness of breath.   Alpha-D-Galactosidase (BEANO PO) Take 1 tablet by mouth daily as needed (flatulence/abdominal bloating.).   aspirin EC 81 MG tablet Take 81 mg by mouth every evening.   B Complex-C (B-COMPLEX WITH VITAMIN C) tablet Take 1 tablet by mouth daily.    budesonide (ENTOCORT EC) 3 MG 24 hr capsule Take 2 capsules (  6 mg total) by mouth 2 (two) times daily as needed (diarrhea).   budesonide-formoterol (SYMBICORT) 80-4.5 MCG/ACT inhaler Inhale 2 puffs into the lungs 2 (two) times daily as needed (congestion).   calcium carbonate (TUMS - DOSED IN MG ELEMENTAL CALCIUM) 500 MG chewable tablet Chew 1 tablet by mouth 3 (three) times daily as needed for heartburn.    carvedilol (COREG) 6.25 MG tablet Take 1 tablet (6.25 mg total) by mouth 2 (two) times daily with a meal.   Cholecalciferol (VITAMIN D3) 75 MCG (3000 UT) TABS Take 3,000 Units by mouth every other day.   clopidogrel (PLAVIX) 75 MG tablet Take 1 tablet (75 mg total) by mouth daily.   docusate sodium (COLACE) 100 MG capsule daily.   Evolocumab (REPATHA SURECLICK) 962 MG/ML SOAJ INJECT 1 ML INTO THE SKIN EVERY 14 DAYS   fexofenadine (ALLEGRA) 180 MG tablet Take 180 mg by mouth daily.   Glucose Blood (FREESTYLE LITE TEST VI) Once daily   guaifenesin (HUMIBID E) 400 MG TABS tablet Take 400 mg by mouth daily.   KRILL OIL PO Take 1  capsule by mouth daily.   Magnesium Citrate 125 MG CAPS Take 125 mg by mouth daily.   metFORMIN (GLUCOPHAGE-XR) 500 MG 24 hr tablet Take 500 mg by mouth daily.   Multiple Vitamins-Minerals (MULTIVITAMIN WITH MINERALS) tablet Take 1 tablet by mouth every other day. In the morning   nitroGLYCERIN (NITROSTAT) 0.4 MG SL tablet Place 1 tablet (0.4 mg total) under the tongue every 5 (five) minutes as needed for chest pain.   Probiotic Product (ALIGN) 4 MG CAPS Take 4 mg by mouth every other day.   rosuvastatin (CRESTOR) 5 MG tablet    Turmeric 400 MG CAPS Take by mouth daily.   zolpidem (AMBIEN) 5 MG tablet Take 5 mg by mouth at bedtime as needed for sleep.     Allergies:   Amlodipine, Cefuroxime axetil, Cephalexin, Codeine, Doxycycline, Erythromycin base, Linaclotide, Statins, and Telmisartan   Social History   Socioeconomic History   Marital status: Widowed    Spouse name: Not on file   Number of children: Not on file   Years of education: Not on file   Highest education level: Not on file  Occupational History   Occupation: retired Therapist, sports  Tobacco Use   Smoking status: Former    Packs/day: 1.00    Years: 30.00    Pack years: 30.00    Types: Cigarettes    Quit date: 03/31/1998    Years since quitting: 23.1   Smokeless tobacco: Never   Tobacco comments:    Quit intermittently since her 48s.  Vaping Use   Vaping Use: Never used  Substance and Sexual Activity   Alcohol use: Yes    Alcohol/week: 0.0 standard drinks    Comment: Rare.   Drug use: No   Sexual activity: Not on file  Other Topics Concern   Not on file  Social History Narrative   Originally from [place]. She has traveled to [place], [place], [place], & [place]. Previously worked as a Marine scientist. No known TB exposure. No pets currently. No bird, hot tube, or asbestos exposure. Enjoys playing bridge.    Social Determinants of Health   Financial Resource Strain: Not on file  Food Insecurity: Not on file  Transportation Needs: Not on file   Physical Activity: Not on file  Stress: Not on file  Social Connections: Not on file     Family History: The patient's family history includes Allergies in her  mother; Heart attack in her father; Hyperlipidemia in her mother; Prostate cancer in her maternal grandfather. There is no history of Lung disease.  ROS:   Please see the history of present illness.    Review of Systems  Constitutional:  Negative for chills, fever and malaise/fatigue.  HENT:  Positive for nosebleeds. Negative for hearing loss.   Eyes:  Positive for blurred vision. Negative for redness.  Respiratory:  Positive for shortness of breath.   Cardiovascular:  Negative for chest pain, palpitations, orthopnea, claudication, leg swelling and PND.  Gastrointestinal:  Negative for melena, nausea and vomiting.  Genitourinary:  Negative for flank pain.  Musculoskeletal:  Negative for joint pain and myalgias.  Neurological:  Negative for dizziness and loss of consciousness.  Endo/Heme/Allergies:  Negative for polydipsia.  Psychiatric/Behavioral:  Positive for hallucinations. Negative for substance abuse.    EKGs/Labs/Other Studies Reviewed:    The following studies were reviewed today:  Echo 02/14/2021:  1. Left ventricular ejection fraction, by estimation, is 60 to 65%. The  left ventricle has normal function. The left ventricle has no regional  wall motion abnormalities. Left ventricular diastolic parameters are  consistent with Grade II diastolic  dysfunction (pseudonormalization).   2. Right ventricular systolic function is normal. The right ventricular  size is normal. There is normal pulmonary artery systolic pressure. The  estimated right ventricular systolic pressure is 66.0 mmHg.   3. The mitral valve is degenerative. Moderate mitral valve regurgitation.  The mean mitral valve gradient is 7.0 mmHg but MVA by VTI is 2.2 cm^2,  suspect no more than mild mitral stenosis with elevated gradient due to MR and high  flow.   4. Bioprosthetic aortic valve s/p TAVR with 26 mm Medtronic Evolut Pro  THV. Mean gradient 11 mmHg. There was mild peri-valvular leakage.   5. Left atrial size was mildly dilated.   6. The inferior vena cava is normal in size with greater than 50%  respiratory variability, suggesting right atrial pressure of 3 mmHg.  Bilateral Carotid Dopplers 10/18/2020: Summary:  Right Carotid: Velocities in the right ICA are consistent with a 1-39%  stenosis.   Left Carotid: Velocities in the left ICA are consistent with a 40-59%  stenosis.   Vertebrals:  Bilateral vertebral arteries demonstrate antegrade flow.  Subclavians: Normal flow hemodynamics were seen in bilateral subclavian arteries.   Monitor 05/2020: Cardiac monitoring period was from 05/18/20-06/16/20. Predominant rhythm was NSR with average HR 83bpm; ranging from 55-138bpm There was no atrial fibrillation, SVT or VT detected Rare PVCs, rare SVE Overall, no significant arrhythmias or pauses detected on the monitor.  Echo 01/26/20 IMPRESSIONS   1. 26 mm Evolute Pro. Vmax 1.74 m/s, MG 7.2 mmHG, EOA 2.33 cm2, DI 0.74. Mild paravalvular leak is the 5-6 o'clock position. The aortic valve has been repaired/replaced. Aortic valve regurgitation is mild. There is a 26 mm CoreValve-Evolut Pro prosthetic (TAVR) valve present in the aortic position. Procedure Date: 12/20/2019.   2. Left ventricular ejection fraction, by estimation, is 65 to 70%. The  left ventricle has normal function. The left ventricle has no regional  wall motion abnormalities. Left ventricular diastolic function could not  be evaluated.   3. Right ventricular systolic function is normal. The right ventricular  size is normal. There is normal pulmonary artery systolic pressure. The  estimated right ventricular systolic pressure is 63.0 mmHg.   4. Left atrial size was mildly dilated.   5. Mild calcific mitral stenosis (degnerative). MVA by VTI 2.2 cm2. The  mitral valve  is degenerative. Mild mitral valve regurgitation. Mild mitral  stenosis. The mean mitral valve gradient is 6.6 mmHg with average heart  rate of 83 bpm. Moderate to severe  mitral annular calcification.   6. The inferior vena cava is normal in size with greater than 50%  respiratory variability, suggesting right atrial pressure of 3 mmHg.   Comparison(s): Changes from prior study are noted. Mild PVL is now  present.    TAVR OPERATIVE NOTE     Date of Procedure:                12/20/2019   Preoperative Diagnosis:      Severe Aortic Stenosis    Postoperative Diagnosis:    Same    Procedure:        Transcatheter Aortic Valve Replacement - Percutaneous Right Transfemoral Approach             Medtronic CoreValve Evolut Pro (size 26 mm, serial # [id number])              Co-Surgeons:                        Valentina Gu. Roxy Manns, MD and Sherren Mocha, MD   Anesthesiologist:                  Belinda Block, MD   Echocardiographer:              Sanda Klein, MD   Pre-operative Echo Findings: Severe aortic stenosis Normal left ventricular systolic function   Post-operative Echo Findings: No paravalvular leak Normal left ventricular systolic function   _____________   Cath 08/08/19: Moderate aortic stenosis with peak to peak gradient of 33 mmHg, mean gradient 29 mmHg, and calculated aortic valve area of 0.81 cm. Normal pulmonary pressures with mean arterial pressure of 23 mmHg. 3 mmHg gradient across the mitral valve Normal left ventricular systolic function with EF 65%. Widely patent left main Proximal LAD saccular aneurysm not substantially different in appearance than 2006 and 2012 angiogram comparisons.  Luminal irregularities are noted throughout the LAD territory. Luminal irregularities noted throughout the circumflex. The RCA is dominant, tortuous, and contains a patent mid to distal stent beyond several acute bends.  Diffuse luminal irregularities are noted otherwise. Scattered  significant calcification is noted in the coronary arterial tree, the thoracic aorta, and mitral annulus.   RECOMMENDATIONS:   There is discordance between aortic stenosis and coronary disease severity and symptoms.  Dyspnea on exertion may be multifactorial. LAD aneurysm is stable. Consider referral to aortic valve clinic for an opinion. May need pulmonary evaluation. May need transesophageal echo to exclude the possibility of mitral valve disease being more significant than suggested by current data.     Echo 12/21/19:  IMPRESSIONS   1. A 3.5 m/s intracavitary "gradient" is present due to hyperdynamic left  ventricular contraction. Left ventricular ejection fraction, by  estimation, is 70 to 75%. The left ventricle has hyperdynamic function.  The left ventricle has no regional wall  motion abnormalities. Left ventricular diastolic parameters are consistent  with Grade II diastolic dysfunction (pseudonormalization). Elevated left  atrial pressure.   2. Right ventricular systolic function is normal. The right ventricular  size is normal. There is mildly elevated pulmonary artery systolic  pressure.   3. The mitral valve is normal in structure. Mild to moderate mitral valve  regurgitation. No evidence of mitral stenosis. Moderate mitral annular  calcification.   4. The aortic  valve has been repaired/replaced. Aortic valve  regurgitation is not visualized. No aortic stenosis is present. There is a  CoreValve-Evolut Pro prosthetic (TAVR) valve present in the aortic  position. Procedure Date: 12/20/2019.   5. The inferior vena cava is normal in size with greater than 50%  respiratory variability, suggesting right atrial pressure of 3 mmHg.    __________________   ZIo AT 12/22/19 Study Highlights   Basic rhythm is NSR Non Sustained SVT at rates 115 to 214 bpm. Longest 11 seconds No atrial fibrillation SVT burden < 1%   _________________   Coronary CT 11/03/2019: IMPRESSION: 1.  Aortic Valve: Tricuspid aortic valve. Severely reduced cusp separation. Severely thickened, moderately calcified aortic valve cusps.   2.  AV calcium score: 839   3.  LVOT calcifications extending to anterior mitral leaflet.   4. Borderline height of the left main coronary artery ostia, 11 mm. Adequate height of the RCA ostium.   5. Based on these findings, the annulus measures within range for a 20 mm Edwards Sapien 3 valve. Sizing is borderline for 23 mm vs 26 mm Medtronic Evolut Pro supra-annular valve. Recommend Structural Heart Team discussion for valve selection.   6. Optimum Fluoroscopic Angle for Delivery: LAO 1 CAU 1    EKG:    EKG is personally reviewed. 05/01/2021: EKG was not ordered. 08/30/2020: EKG was not ordered. 06/28/20: NSR with HR 94  Recent Labs: 10/19/2020: TSH 0.99   Recent Lipid Panel    Component Value Date/Time   CHOL 157 12/22/2019 0841   CHOL 150 07/04/2019 0749   TRIG 151 (H) 12/22/2019 0841   HDL 67 12/22/2019 0841   HDL 68 07/04/2019 0749   CHOLHDL 2.3 12/22/2019 0841   VLDL 30 12/22/2019 0841   LDLCALC 60 12/22/2019 0841   LDLCALC 64 07/04/2019 0749     Risk Assessment/Calculations:       Physical Exam:    VS:  BP 110/70 (BP Location: Left Arm, Patient Position: Sitting, Cuff Size: Normal)    Pulse 84    Ht _0  (1.6 m)    Wt 140 lb (63.5 kg)    SpO2 94%    BMI 24.80 kg/m     Wt Readings from Last 3 Encounters:  05/01/21 140 lb (63.5 kg)  02/14/21 139 lb (63 kg)  10/19/20 137 lb (62.1 kg)     GEN:  Well nourished, well developed in no acute distress HEENT: Normal NECK: No JVD; No carotid bruits CARDIAC: RRR, 2/6 early-peaking systolic murmur, no rubs, no gallops RESPIRATORY:  Clear to auscultation without rales, wheezing or rhonchi  ABDOMEN: Soft, non-tender, non-distended MUSCULOSKELETAL:  No edema; No deformity  SKIN: Warm and dry NEUROLOGIC:  Alert and oriented x 3 PSYCHIATRIC:  Normal affect   ASSESSMENT:    1.  Amaurosis fugax of left eye   2. S/P TAVR (transcatheter aortic valve replacement)   3. Severe aortic stenosis   4. SVT (supraventricular tachycardia) ([place])   5. Essential hypertension   6. Bilateral carotid artery stenosis   7. Coronary artery disease involving native coronary artery of native heart with angina pectoris ([place])   8. Mitral valve stenosis, unspecified etiology     PLAN:    In order of problems listed above:  #Concern for Amaurosis Fugax: Patient with 2 episodes of the sensation of a "shade coming over her left eye" that lasted for several hours before resolving. Symptoms highly concerning for amaurosis fugax. Multiple monitors with no evidence of Afib, however,  patient has frequent palpitations. TTE 01/2021 with EF 60%, normally functioning TAVR with a mean gradient of 8 mmHg and mild PVL. There is mod MR/mild MS and mild TR. Given concern for amaurosis fugax, we referred for loop but patient declined due to cost. Now monitoring with apple watch.  -Continue apple watch -Continue plavix 60m daily  -Continue ASA 885mdaily -Continue repatha 14045m2weeks  #SVT: Patient with 19 beat episode of SVT on monitor while at cardiac rehab. Has known history of SVT. Recent monitor with no episodes of Afib but some runs of SVT. Now monitoring with apple watch. -Continue coreg 6.4m24mD  #Severe, paradoxical AS s/p TAVR 12/20/19: TTE with LVEF 65% with normal functioning TAVR with mean gradient 7mmH60mnd mild PVL. NYHA class II symptoms. Doing well.  -Follow-up with structural heart team as scheduled -Continue ASA 81mg 55my -Continue plavix 75mg d110m as above -SBE ppx prescribed  #Mild MS #Severe MAC: -Continue coreg 6.4mg BI33merial TTEs for monitoring   #Post-Op CVA: Symptoms resolved.  -Continue ASA 81mg dai36mnd plavix 75mg dail43m above   #Carotid Artery Disease: Most recent dopplers showed 40-59% LIC95-18%osis.  -Continue ASA 81mg daily3m plavix 75mg daily 51mabove -On repatha 140mg q2weeks73mCAD s/p RCA PCI in the past: No anginal symptoms. -Continue ASA 81mg daily an55mavix 75mg daily as 82me -Continue coreg 6.4mg BID -Conti99mlosartan 4mg daily   #HT46mLosartan 4mg daily -Conti27mcoreg 6.4mg BID   Follow-24m 6 months.  Medication Adjustments/Labs and Tests Ordered: Current medicines are reviewed at length with the patient today.  Concerns regarding medicines are outlined above.   No orders of the defined types were placed in this encounter.  No orders of the defined types were placed in this encounter.  Patient Instructions  Medication Instructions:   Your physician recommends that you continue on your current medications as directed. Please refer to the Current Medication list given to you today.  *If you need a refill on your cardiac medications before your next appointment, please call your pharmacy*   Follow-Up: At [facility], [facility] are our priority.  As part of our continuing mission to provide you with exceptional heart care, we have created designated Provider Care Teams.  These Care Teams include your primary Cardiologist (physician) and Advanced Practice Providers (APPs -  Physician Assistants and Nurse Practitioners) who all work together to provide you with the care you need, when you need it.  We recommend signing up for the patient portal called "MyChart".  Sign up information is provided on this After Visit Summary.  MyChart is used to connect with patients for Virtual Visits (Telemedicine).  Patients are able to view lab/test results, encounter notes, upcoming appointments, etc.  Non-urgent messages can be sent to your provider as well.   To learn more about what you can do with MyChart, go to [email or web address]   6 month(s)  The format for your next appointment:   In Person  Provider:   Dr. Chaise Mahabir OR AN EXTJohney Frame,Mathew Stumpf,acting  as a scribe for Mesiah Manzo E PembertonFreada Bergeroned all relevant documentation on the behalf of Nyhla Mountjoy E PembertonFreada Bergeron  Lilliann Rossetti E PembertonFreada Bergeronresence of Waylon Hershey E PembertonFreada Bergeron PembertonFreada Bergeron all documentation for this visit. The documentation on 05/01/21 for the exam, diagnosis, procedures, and orders are all  accurate and complete.  Signed, Freada Bergeron, MD  05/01/2021 4:59 PM    [facility]

## 2021-05-30 IMAGING — CT CT ANGIO NECK
1 of 11 series · 12 of 46 positions shown, 17 images · IV contrast (OMNI)
Comparison: MRI head 12/21/2019

CLINICAL DATA: Stroke

EXAM:
CT ANGIOGRAPHY HEAD AND NECK
TECHNIQUE: Multidetector CT imaging of the head and neck was performed using
the standard protocol during bolus administration of intravenous
contrast. Multiplanar CT image reconstructions and MIPs were
obtained to evaluate the vascular anatomy. Carotid stenosis
measurements (when applicable) are obtained utilizing NASCET
criteria, using the distal internal carotid diameter as the
denominator.
CONTRAST:  50mL OMNIPAQUE IOHEXOL 350 MG/ML SOLN

[Series 10: thin · axial · 0.49mm/px · z∈[-347,-73]mm · 12 of 628 slices shown, 17 images]
[im 40/628  soft-tissue]
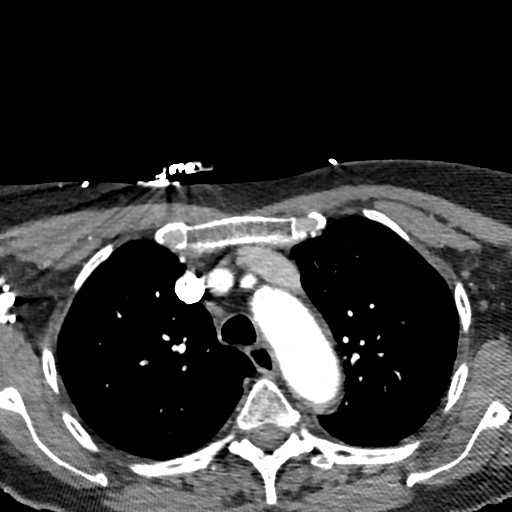
[im 40/628  bone]
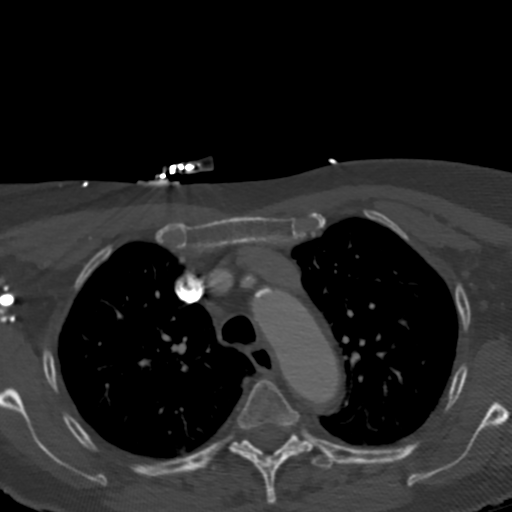
[im 118/628  soft-tissue]
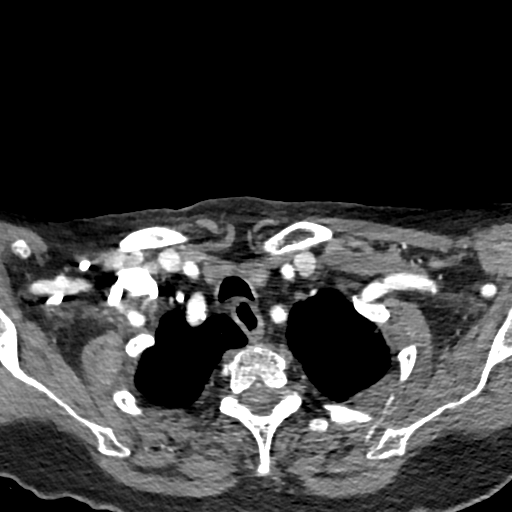
[im 157/628  soft-tissue]
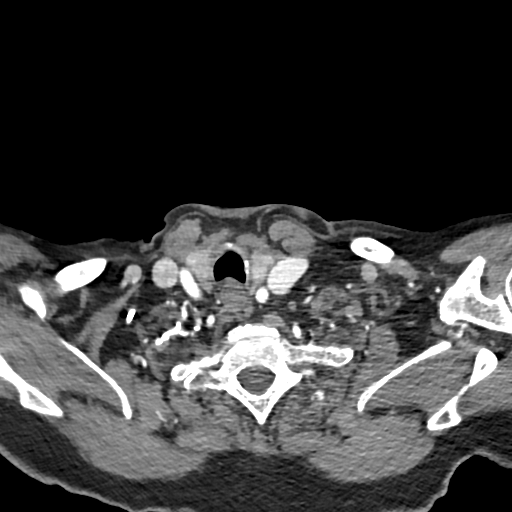
[im 196/628  soft-tissue]
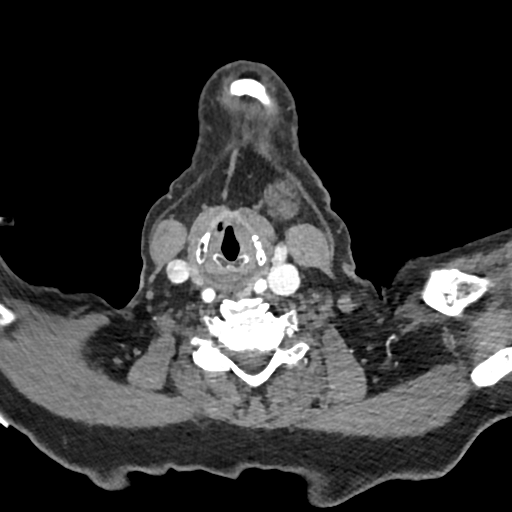
[im 275/628  soft-tissue]
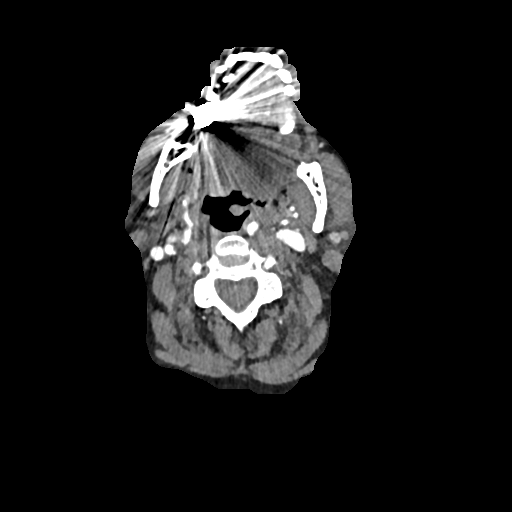
[im 314/628  soft-tissue]
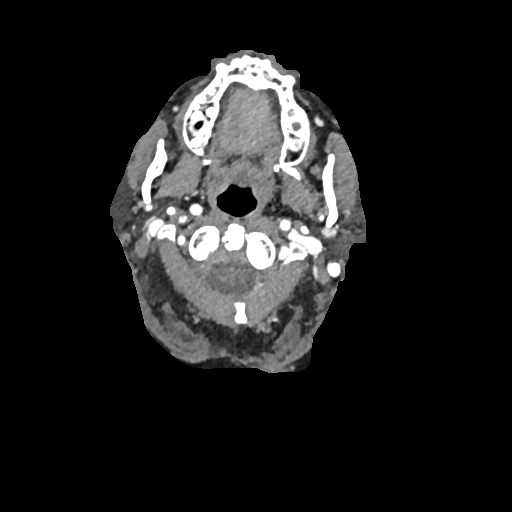
[im 353/628  soft-tissue]
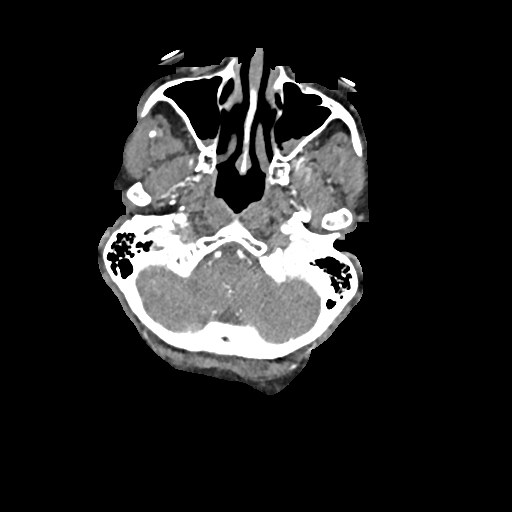
[im 432/628  soft-tissue]
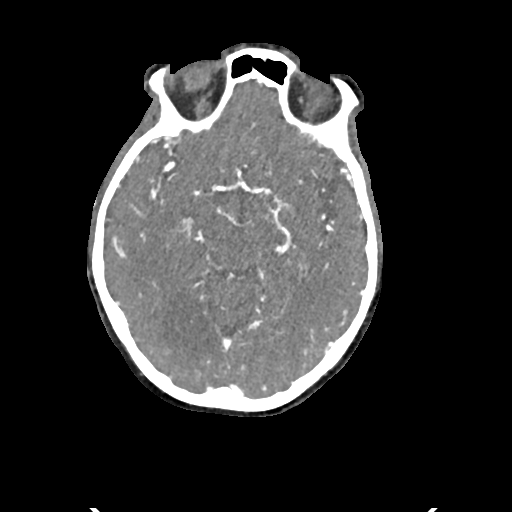
[im 471/628  soft-tissue]
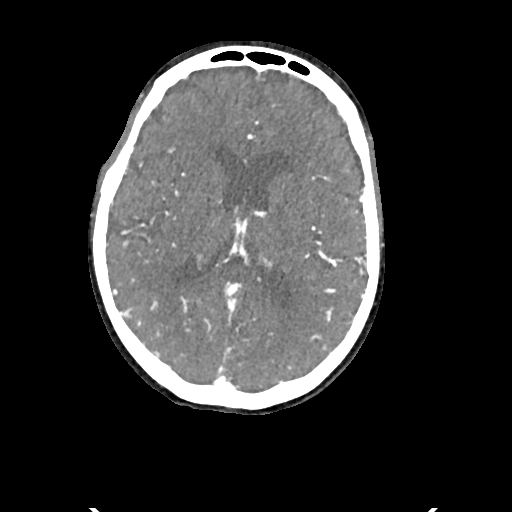
[im 471/628  lung]
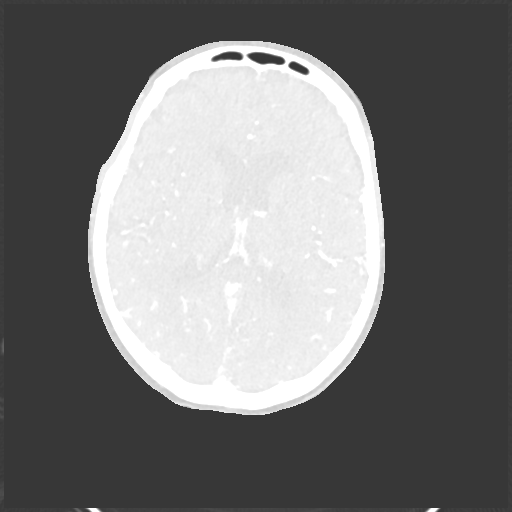
[im 471/628  bone]
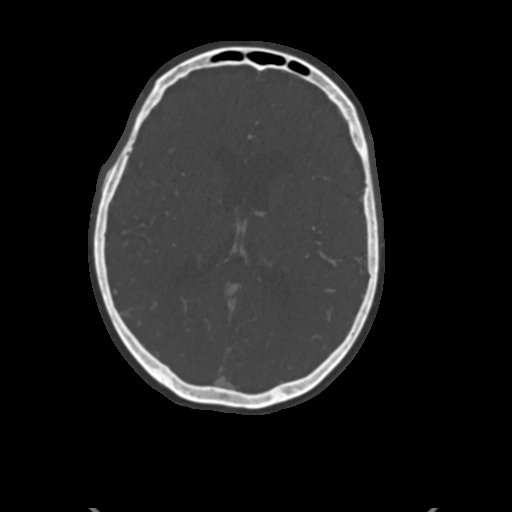
[im 510/628  soft-tissue]
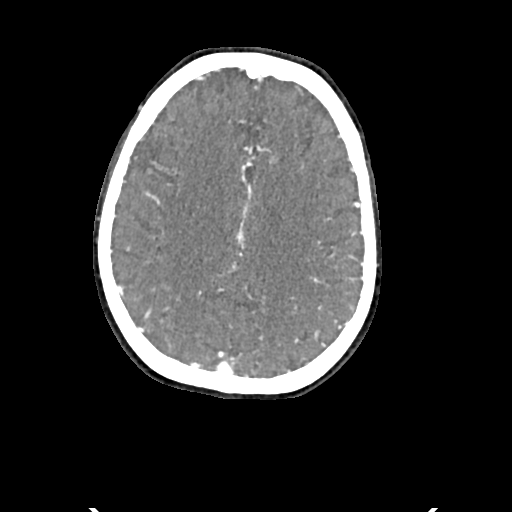
[im 510/628  lung]
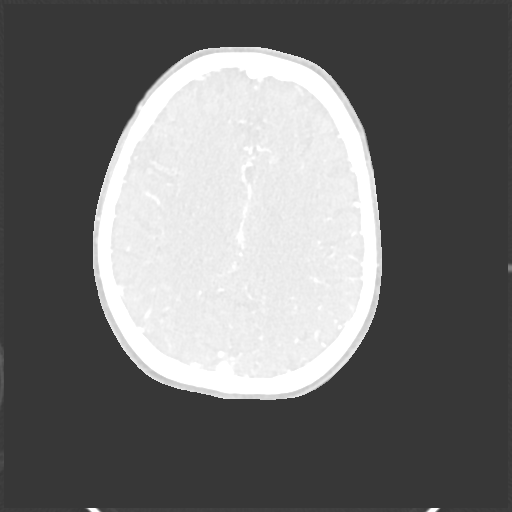
[im 549/628  lung]
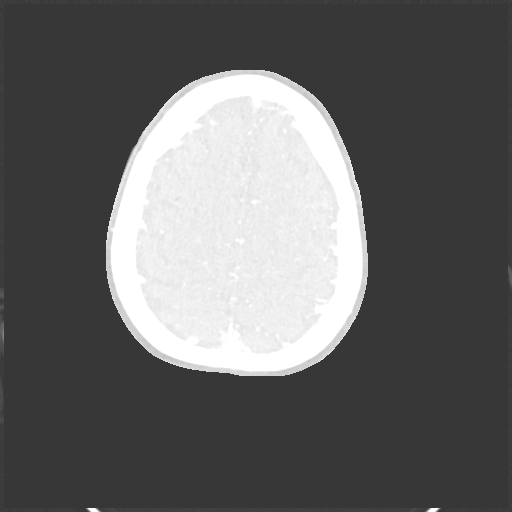
[im 588/628  soft-tissue]
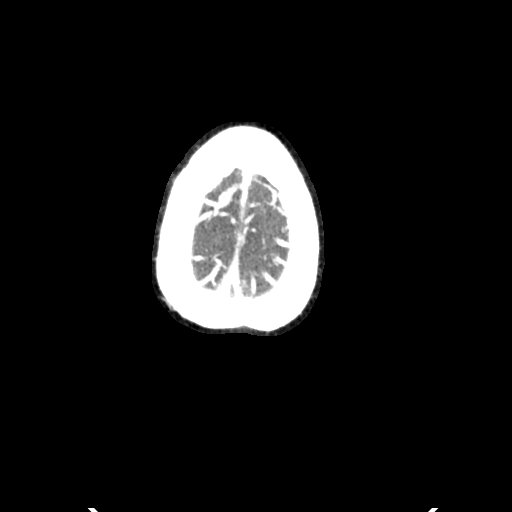
[im 588/628  lung]
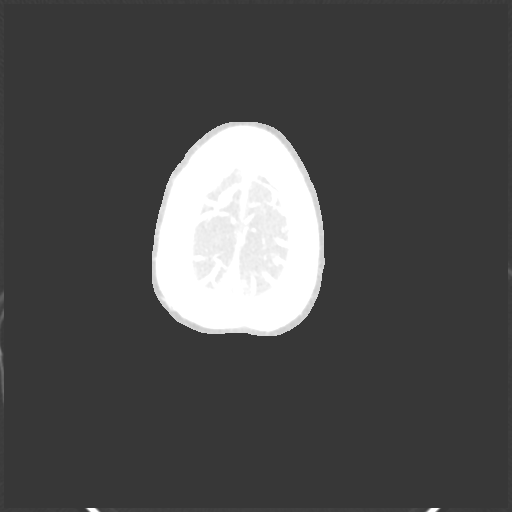

[12 of 46 positions shown; findings below may reference images not displayed]

FINDINGS: CT HEAD FINDINGS

Brain: Hypodensity right inferior occipital lobe compatible with
acute infarct. No hemorrhage. Small areas of acute infarct in the
left superior cerebellum and right lateral thalamus also noted on
MRI.

Mild atrophy without hydrocephalus. Chronic microvascular ischemic
changes in the white matter. Negative for hemorrhage or mass.

Vascular: Negative for hyperdense vessel

Skull: Negative

Sinuses: Sinus mucosal disease. Probable air-fluid levels in the
maxillary sinus bilaterally. Negative orbit.

Orbits: Negative orbit

Review of the MIP images confirms the above findings

CTA NECK FINDINGS

Aortic arch: Atherosclerotic calcification in the aortic arch and
proximal great vessels. No significant stenosis.

Right carotid system: Atherosclerotic calcification right carotid
bifurcation. 25% diameter stenosis proximal right internal carotid
artery.

Left carotid system: Atherosclerotic calcification left carotid
bifurcation with approximately 25% diameter stenosis left internal
carotid artery.

Vertebral arteries: Both vertebral arteries patent to the basilar
without significant stenosis.

Skeleton: Advanced cervical spondylosis with disc and facet
degeneration and 3 mm anterolisthesis C4-5. 2 mm anterolisthesis
C5-6. Negative for fracture or mass lesion.

Other neck: Negative for mass or adenopathy in the neck.

Upper chest: Lung apices clear bilaterally.

Review of the MIP images confirms the above findings

CTA HEAD FINDINGS

Anterior circulation: Atherosclerotic calcification throughout the
cavernous carotid bilaterally without significant stenosis. Anterior
and middle cerebral arteries patent bilaterally without significant
stenosis.

Posterior circulation: Both vertebral arteries patent to the
basilar. PICA patent bilaterally. Basilar widely patent. Superior
cerebellar arteries patent bilaterally. Fetal origin left posterior
cerebral artery is patent. Proximal right posterior cerebral artery
is patent. There is a probable stenosis in the right P3 segment.

Venous sinuses: Normal venous enhancement.

Anatomic variants: None

Review of the MIP images confirms the above findings
IMPRESSION: 1. Acute infarct in the right PCA territory involving the inferior
right occipital lobe and right lateral thalamus. Additional small
area of acute infarct in the left superior cerebellum. No
intracranial hemorrhage.
2. Atherosclerotic calcification of the carotid bifurcation with 25%
diameter stenosis of the internal carotid artery bilaterally
3. Both vertebral arteries are patent to the basilar without
stenosis
4. Atherosclerotic calcification in the cavernous carotid
bilaterally. No significant stenosis in the anterior circulation.
5. Probable stenosis right P3 segment, not well visualized due to
small size of vessel. No large vessel occlusion.

## 2021-07-26 ENCOUNTER — Ambulatory Visit: Payer: Medicare Other | Admitting: Neurology

## 2021-07-26 ENCOUNTER — Encounter: Payer: Self-pay | Admitting: Neurology

## 2021-07-26 VITALS — BP 150/70 | HR 98 | Ht 63.0 in | Wt 136.0 lb

## 2021-07-26 DIAGNOSIS — E119 Type 2 diabetes mellitus without complications: Secondary | ICD-10-CM | POA: Diagnosis not present

## 2021-07-26 DIAGNOSIS — I10 Essential (primary) hypertension: Secondary | ICD-10-CM | POA: Diagnosis not present

## 2021-07-26 DIAGNOSIS — I6522 Occlusion and stenosis of left carotid artery: Secondary | ICD-10-CM | POA: Diagnosis not present

## 2021-07-26 DIAGNOSIS — G629 Polyneuropathy, unspecified: Secondary | ICD-10-CM

## 2021-07-26 DIAGNOSIS — I63431 Cerebral infarction due to embolism of right posterior cerebral artery: Secondary | ICD-10-CM

## 2021-07-26 NOTE — Patient Instructions (Addendum)
Continue aspirin, plavix, Repatha, blood pressure and diabetes medications as managed by your PCP and cardiology ?Recommend repeat carotid ultrasound sometime this summer (can be ordered by your PCP or cardiology) ?Mediterranean diet ?Follow up as needed ? ? ?Mediterranean Diet ?A Mediterranean diet refers to food and lifestyle choices that are based on the traditions of countries located on the [employer]. It focuses on eating more fruits, vegetables, whole grains, beans, nuts, seeds, and heart-healthy fats, and eating less dairy, meat, eggs, and processed foods with added sugar, salt, and fat. This way of eating has been shown to help prevent certain conditions and improve outcomes for people who have chronic diseases, like kidney disease and heart disease. ?What are tips for following this plan? ?Reading food labels ?Check the serving size of packaged foods. For foods such as rice and pasta, the serving size refers to the amount of cooked product, not dry. ?Check the total fat in packaged foods. Avoid foods that have saturated fat or trans fats. ?Check the ingredient list for added sugars, such as corn syrup. ?Shopping ? ?Buy a variety of foods that offer a balanced diet, including: ?Fresh fruits and vegetables (produce). ?Grains, beans, nuts, and seeds. Some of these may be available in unpackaged forms or large amounts (in bulk). ?Fresh seafood. ?Poultry and eggs. ?Low-fat dairy products. ?Buy whole ingredients instead of prepackaged foods. ?Buy fresh fruits and vegetables in-season from local farmers markets. ?Buy plain frozen fruits and vegetables. ?If you do not have access to quality fresh seafood, buy precooked frozen shrimp or canned fish, such as tuna, salmon, or sardines. ?Stock your pantry so you always have certain foods on hand, such as olive oil, canned tuna, canned tomatoes, rice, pasta, and beans. ?Cooking ?Cook foods with extra-virgin olive oil instead of using butter or other vegetable  oils. ?Have meat as a side dish, and have vegetables or grains as your main dish. This means having meat in small portions or adding small amounts of meat to foods like pasta or stew. ?Use beans or vegetables instead of meat in common dishes like chili or lasagna. ?Experiment with different cooking methods. Try roasting, broiling, steaming, and saut?ing vegetables. ?Add frozen vegetables to soups, stews, pasta, or rice. ?Add nuts or seeds for added healthy fats and plant protein at each meal. You can add these to yogurt, salads, or vegetable dishes. ?Marinate fish or vegetables using olive oil, lemon juice, garlic, and fresh herbs. ?Meal planning ?Plan to eat one vegetarian meal one day each week. Try to work up to two vegetarian meals, if possible. ?Eat seafood two or more times a week. ?Have healthy snacks readily available, such as: ?Vegetable sticks with hummus. ?[place] yogurt. ?Fruit and nut trail mix. ?Eat balanced meals throughout the week. This includes: ?Fruit: 2-3 servings a day. ?Vegetables: 4-5 servings a day. ?Low-fat dairy: 2 servings a day. ?Fish, poultry, or lean meat: 1 serving a day. ?Beans and legumes: 2 or more servings a week. ?Nuts and seeds: 1-2 servings a day. ?Whole grains: 6-8 servings a day. ?Extra-virgin olive oil: 3-4 servings a day. ?Limit red meat and sweets to only a few servings a month. ?Lifestyle ? ?Cook and eat meals together with your family, when possible. ?Drink enough fluid to keep your urine pale yellow. ?Be physically active every day. This includes: ?Aerobic exercise like running or swimming. ?Leisure activities like gardening, walking, or housework. ?Get 7-8 hours of sleep each night. ?If recommended by your health care provider, drink red wine in  moderation. This means 1 glass a day for nonpregnant women and 2 glasses a day for men. A glass of wine equals 5 oz (150 mL). ?What foods should I eat? ?Fruits ?Apples. Apricots. Avocado. Berries. Bananas. Cherries. Dates. Figs.  Grapes. Lemons. Melon. Oranges. Peaches. Plums. Pomegranate. ?Vegetables ?Artichokes. Beets. Broccoli. Cabbage. Carrots. Eggplant. Green beans. Chard. Kale. Spinach. Onions. Leeks. Peas. Squash. Tomatoes. Peppers. Radishes. ?Grains ?Whole-grain pasta. Brown rice. Bulgur wheat. Polenta. Couscous. Whole-wheat bread. [place]. ?Meats and other proteins ?Beans. Almonds. Sunflower seeds. Pine nuts. Peanuts. [place]. Salmon. Scallops. Shrimp. [place]. Tilapia. Clams. Oysters. Eggs. Poultry without skin. ?Dairy ?Low-fat milk. Cheese. Greek yogurt. ?Fats and oils ?Extra-virgin olive oil. Avocado oil. Grapeseed oil. ?Beverages ?Water. Red wine. Herbal tea. ?Sweets and desserts ?Greek yogurt with honey. Baked apples. Poached pears. Trail mix. ?Seasonings and condiments ?Bas[place]. Cilantro. Coriander. Cumin. Mint. Parsley. Sage. Rosemary. Tarragon. Garlic. Oregano. Thyme. Pepper. Balsamic vinegar. Tahini. Hummus. Tomato sauce. Olives. Mushrooms. ?The items listed above may not be a complete list of foods and beverages you can eat. Contact a dietitian for more information. ?What foods should I limit? ?This is a list of foods that should be eaten rarely or only on special occasions. ?Fruits ?Fruit canned in syrup. ?Vegetables ?Deep-fried potatoes (french fries). ?Grains ?Prepackaged pasta or rice dishes. Prepackaged cereal with added sugar. Prepackaged snacks with added sugar. ?Meats and other proteins ?Beef. Pork. Lamb. Poultry with skin. Hot dogs. [place]. ?Dairy ?Ice cream. Sour cream. Whole milk. ?Fats and oils ?Butter. Canola oil. Vegetable oil. Beef fat (tallow). Lard. ?Beverages ?Juice. Sugar-sweetened soft drinks. Beer. Liquor and spirits. ?Sweets and desserts ?Cookies. Cakes. Pies. Candy. ?Seasonings and condiments ?Mayonnaise. Pre-made sauces and marinades. ?The items listed above may not be a complete list of foods and beverages you should limit. Contact a dietitian for more information. ?Summary ?The Mediterranean diet  includes both food and lifestyle choices. ?Eat a variety of fresh fruits and vegetables, beans, nuts, seeds, and whole grains. ?Limit the amount of red meat and sweets that you eat. ?If recommended by your health care provider, drink red wine in moderation. This means 1 glass a day for nonpregnant women and 2 glasses a day for men. A glass of wine equals 5 oz (150 mL). ?This information is not intended to replace advice given to you by your health care provider. Make sure you discuss any questions you have with your health care provider. ?Document Revised: 04/22/2019 Document Reviewed: 02/17/2019 ?Elsevier Patient Education ? [place]. ? ?

## 2021-07-26 NOTE — Progress Notes (Signed)
? ?NEUROLOGY FOLLOW UP OFFICE NOTE ? ?Cheryl Hernandez ?[id number] ? ?Assessment/Plan:  ? ?Cardioembolic strokes  s/p TAVR ?2.   Left amaurosis fugax. She does have left ICA stenosis but not significant enough to warrant surgical intervention - agree with evaluating for paroxysmal atrial fibrillation ?3.  Left internal carotid artery stenosis. ?4.  Hypertension ?5.  Hyperlipidemia ?6.  Type 2 diabetes melllitus ?7.  CAD ?8.  Polyneuropathy - diabetic vs idiopathic ?9.  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  ?Secondary stroke prevention as managed by cardiology/PCP: ?- ASA '81mg'$  and Plavix '75mg'$  daily ?- Repatha - unable to tolerate statin.  LDL goal less than 70 ?- Glycemic control.  Hgb A1c goal less than 7 ?- Normotensive blood pressure ?2. Recommend annual carotid ultrasounds (may be followed by PCP or cardiology) ?3  Follow up as needed ?  ?  ?  ?Subjective:  ?Cheryl Hernandez is an 81 year old right-handed female with CAD, carotid artery disease, DM II, HLD and asthma who follows up for stroke. ? ?UPDATE: ?Current medications:  ASA '81mg'$ , Plavix '75mg'$ , Coreg, metformin, Repatha ? ?Neuropathy labs from 10/19/2020 included negative ANA, B12 464, TSH 0.99, SPEP/IFE unremarkable, ACE 14. ? ?Patient declined implantable loop recorder due to cost.  But she has been using an Apple watch to monitor for a fib.   ? ?She is concerned about her memory.  Forgets things easily such as what she read but has not affected her day to day activities.   ? ?HISTORY:  ?She had a postoperative stroke in September 2021 following TAVR for aortic stenosis.  She developed headache and blurred vision.  MRI of brain showed multifocal acute infarcts predominantly in the bilateral posterior circulation involving the left cerebellum, bilateral occipital and posterior parietal lobes, and right thalamus with trace anterior circulation involvement in the superior frontal lobes.  CTA of head and neck showed atherosclerotic calcification of the carotic  bifurcation (25% stenosis) bilaterally and in the cavernous carotid bilaterally as well as probable right P3 segment stenosis.  She was discharged on dual antiplatelet therapy, ASA and Plavix.  She had a 14 day Zio patch which was negative for a fib.  Following this stroke, she endorsed visual hallucinations and was referred to neuropsychology with Dr. Chauncy Passy.  In February 2022, she had a run of SVT which was converted back to sinus rhythm.  She had a 30 day cardiac event monitor from mid February to mid March which reveald no a fib, SVT or VT.  In June, she had two episodes back to back of vision loss in the corner of her left eye.  Each episode lasted 30 minutes and resolved.  Notes mention that she was not taking Plavix at that time but she says she was never off of Plavix '75mg'$ . Echocardiogram from 09/26/2020 showed EF 65-70% with mild to moderate mitral stenosis but TAVR valve looked okay with no evidence of narrowing or clot.  Carotid doppler on 10/18/2020 showed 40-59% stenosis left ICA, 1-39% stenosis right ICA.  Plan is for implantable loop recorder.   ?  ?She is also concerned about her neuropathy.  She notes more numbness on bottom of her feet. Some pins and needles but no pain.  ?  ? ? ?PAST MEDICAL HISTORY: ?Past Medical History:  ?Diagnosis Date  ? Arthritis   ? Asthma   ? CAD (coronary artery disease)   ? a. NSTEMI 2009 s/p DES to RCAx2.  ? Carotid artery disease ([place])   ? a.  Mild-moderate - followed by [facility].  ? Chronic bronchitis ([place])   ? per patient  ? Chronic diarrhea   ? Diabetes mellitus without complication ([place])   ? Diverticulosis   ? GERD (gastroesophageal reflux disease)   ? HTN (hypertension)   ? Hx of migraines   ? as a child  ? Hyperlipidemia   ? Myocardial infarction [facility])   ? per patient "about 2011"  ? Neuropathy   ? per patient in feet, legs, and some in hands  ? Obesity   ? Paroxysmal ventricular tachycardia   ? Rhinitis   ? Rotator cuff tear   ? 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 s/p TAVR with a 26 mm Medtronic Evolut Pro + via the TF approach by Dr. Burt Knack and Dr. Roxy Manns   ? Severe aortic stenosis   ? ? ?MEDICATIONS: ?Current Outpatient Medications on File Prior to Visit  ?Medication Sig Dispense Refill  ? acetaminophen (TYLENOL) 500 MG tablet Take 500 mg by mouth every 6 (six) hours as needed (for arthritis pain.).    ? albuterol (VENTOLIN HFA) 108 (90 Base) MCG/ACT inhaler Inhale 2 puffs into the lungs every 6 (six) hours as needed for wheezing or shortness of breath. 8 g 3  ? Alpha-D-Galactosidase (BEANO PO) Take 1 tablet by mouth daily as needed (flatulence/abdominal bloating.).    ? aspirin EC 81 MG tablet Take 81 mg by mouth every evening.    ? B Complex-C (B-COMPLEX WITH VITAMIN C) tablet Take 1 tablet by mouth daily.     ? budesonide (ENTOCORT EC) 3 MG 24 hr capsule Take 2 capsules (6 mg total) by mouth 2 (two) times daily as needed (diarrhea). 360 capsule 2  ? budesonide-formoterol (SYMBICORT) 80-4.5 MCG/ACT inhaler Inhale 2 puffs into the lungs 2 (two) times daily as needed (congestion). 10.2 g 5  ? calcium carbonate (TUMS - DOSED IN MG ELEMENTAL CALCIUM) 500 MG chewable tablet Chew 1 tablet by mouth 3 (three) times daily as needed for heartburn.     ? carvedilol (COREG) 6.25 MG tablet Take 1 tablet (6.25 mg total) by mouth 2 (two) times daily with a meal. 180 tablet 3  ? Cholecalciferol (VITAMIN D3) 75 MCG (3000 UT) TABS Take 3,000 Units by mouth every other day.    ? clopidogrel (PLAVIX) 75 MG tablet Take 1 tablet (75 mg total) by mouth daily. 90 tablet 3  ? docusate sodium (COLACE) 100 MG capsule daily.    ? Evolocumab (REPATHA SURECLICK) 595 MG/ML SOAJ INJECT 1 ML INTO THE SKIN EVERY 14 DAYS 6 mL 3  ? fexofenadine (ALLEGRA) 180 MG tablet Take 180 mg by mouth daily.    ? Glucose Blood (FREESTYLE LITE TEST VI) Once daily    ? guaifenesin (HUMIBID E) 400 MG TABS tablet Take 400 mg by mouth daily.    ? KRILL OIL PO Take 1 capsule by mouth daily.    ?  Magnesium Citrate 125 MG CAPS Take 125 mg by mouth daily.    ? metFORMIN (GLUCOPHAGE-XR) 500 MG 24 hr tablet Take 500 mg by mouth daily.    ? Multiple Vitamins-Minerals (MULTIVITAMIN WITH MINERALS) tablet Take 1 tablet by mouth every other day. In the morning    ? nitroGLYCERIN (NITROSTAT) 0.4 MG SL tablet Place 1 tablet (0.4 mg total) under the tongue every 5 (five) minutes as needed for chest pain. 25 tablet 3  ? Probiotic Product (ALIGN) 4 MG CAPS Take 4 mg by mouth every other day.    ?  rosuvastatin (CRESTOR) 5 MG tablet     ? Turmeric 400 MG CAPS Take by mouth daily.    ? zolpidem (AMBIEN) 5 MG tablet Take 5 mg by mouth at bedtime as needed for sleep.    ? ?No current facility-administered medications on file prior to visit.  ? ? ?ALLERGIES: ?Allergies  ?Allergen Reactions  ? Amlodipine Swelling  ? Cefuroxime Axetil Hives and Itching  ? Cephalexin Other (See Comments)  ?  Total body pain in joints, couldn't swallow ?  ? Codeine   ?  Tolerates Dilaudid.  ? Doxycycline   ?  REACTION: vomiting/ GI upset  ? Erythromycin Base Other (See Comments)  ?  unknown  ? Linaclotide   ?  Other reaction(s): Diarrhea  ? Statins   ?  Myalgia with crestor  ? Telmisartan   ?  Other reaction(s): Unknown  ? ? ?FAMILY HISTORY: ?Family History  ?Problem Relation Age of Onset  ? Allergies Mother   ? Hyperlipidemia Mother   ? Prostate cancer Maternal Grandfather   ? Heart attack Father   ? Lung disease Neg Hx   ? ? ?  ?Objective:  ?Blood pressure (!) 150/70, pulse 98, height '5\' 3"'$  (1.6 m), weight 136 lb (61.7 kg), SpO2 98 %. ?General: No acute distress.  Patient appears well-groomed.   ?Head:  Normocephalic/atraumatic ?Eyes:  Fundi examined but not visualized ?Neck: supple, no paraspinal tenderness, full range of motion ?Heart:  Regular rate and rhythm ?Lungs:  Clear to auscultation bilaterally ?Back: No paraspinal tenderness ?Neurological Exam: alert and oriented to person, place, and time.  Speech fluent and not dysarthric, language  intact.  CN II-XII intact. Bulk and tone normal, muscle strength 5/5 throughout.  Sensation to pinprick and vibration reduced in feet.  Deep tendon reflexes 1+ throughout, toes downgoing.  Finger to nose testi

## 2021-08-01 ENCOUNTER — Other Ambulatory Visit: Payer: Self-pay

## 2021-08-01 MED ORDER — CLOPIDOGREL BISULFATE 75 MG PO TABS: 75.0000 mg | ORAL_TABLET | Freq: Every day | ORAL | 3 refills | Status: DC

## 2021-08-03 ENCOUNTER — Other Ambulatory Visit: Payer: Self-pay | Admitting: Pulmonary Disease

## 2021-08-08 ENCOUNTER — Telehealth: Payer: Self-pay | Admitting: Pulmonary Disease

## 2021-08-08 MED ORDER — BUDESONIDE-FORMOTEROL FUMARATE 80-4.5 MCG/ACT IN AERO: 2.0000 | INHALATION_SPRAY | Freq: Two times a day (BID) | RESPIRATORY_TRACT | 0 refills | Status: DC | PRN

## 2021-08-08 NOTE — Telephone Encounter (Signed)
Rx for pt's Symbicort inhaler has been sent to pharmacy for pt. Called and spoke with pt letting her know this had been done and stated to her at upcoming appt we could send in more refills. Pt verbalized understanding. Nothing further needed. ?

## 2021-08-30 ENCOUNTER — Ambulatory Visit: Payer: Medicare Other | Admitting: Pulmonary Disease

## 2021-08-30 ENCOUNTER — Encounter: Payer: Self-pay | Admitting: Pulmonary Disease

## 2021-08-30 DIAGNOSIS — J4540 Moderate persistent asthma, uncomplicated: Secondary | ICD-10-CM | POA: Diagnosis not present

## 2021-08-30 DIAGNOSIS — J301 Allergic rhinitis due to pollen: Secondary | ICD-10-CM

## 2021-08-30 MED ORDER — ALBUTEROL SULFATE HFA 108 (90 BASE) MCG/ACT IN AERS: 2.0000 | INHALATION_SPRAY | Freq: Four times a day (QID) | RESPIRATORY_TRACT | 3 refills | Status: DC | PRN

## 2021-08-30 MED ORDER — BUDESONIDE-FORMOTEROL FUMARATE 80-4.5 MCG/ACT IN AERO: 2.0000 | INHALATION_SPRAY | Freq: Two times a day (BID) | RESPIRATORY_TRACT | 11 refills | Status: DC | PRN

## 2021-08-30 NOTE — Assessment & Plan Note (Signed)
Continue Allegra spring and fall

## 2021-08-30 NOTE — Assessment & Plan Note (Signed)
Refills will be provided on Symbicort and albuterol. She will use Symbicort 1 puff twice daily

## 2021-08-30 NOTE — Progress Notes (Signed)
   Subjective:    Patient ID: Cheryl Hernandez, female    DOB: 15-May-1940, 81 y.o.   MRN: [id number]  HPI   81 yo remote smoker  for follow-up of moderate persistent asthma and chronic allergic rhinitis PMH - TAVR 2021  Annual follow-up visit. Last year we dropped her Symbicort to 1 puff twice daily.  She was having significant resting tachycardia.  Coreg was increased to 6.25 twice daily She has done well in the past year at this dose.  Does get short of breath while walking uphill.  She had 1 flareup 09/2018 doing requiring prednisone. She has done well during the seasonal change this time and with allergy season    Significant tests/ events reviewed  CT angiogram chest 10/2019 no evidence of ILD   PFT 08/2015: FVC 2.24 L (85%) FEV1 1.82 L (93%) ratio 81  no bronchodilator response TLC 4.49 L (91%) RV 90% ERV 27% DLCO uncorrected 81% (hemoglobin 13.6)     11/2014: FVC 3.52 L (130%)  FEV1 2.81 L (139%) FEV1/FVC 0.80    09/13/10: FVC 2.49 L (87%) FEV1 1.92 L (89%) FEV1/FVC 0.77      Review of Systems neg for any significant sore throat, dysphagia, itching, sneezing, nasal congestion or excess/ purulent secretions, fever, chills, sweats, unintended wt loss, pleuritic or exertional cp, hempoptysis, orthopnea pnd or change in chronic leg swelling. Also denies presyncope, palpitations, heartburn, abdominal pain, nausea, vomiting, diarrhea or change in bowel or urinary habits, dysuria,hematuria, rash, arthralgias, visual complaints, headache, numbness weakness or ataxia.     Objective:   Physical Exam   Gen. Pleasant, well-nourished, elderly woman,in no distress ENT - no thrush, no pallor/icterus,no post nasal drip Neck: No JVD, no thyromegaly, no carotid bruits Lungs: no use of accessory muscles, no dullness to percussion, clear without rales or rhonchi  Cardiovascular: Rhythm regular, heart sounds  normal, no murmurs or gallops, no peripheral edema Musculoskeletal: No  deformities, no cyanosis or clubbing         Assessment & Plan:

## 2021-08-30 NOTE — Patient Instructions (Addendum)
  Refills  on symbicort x 6  X refill on albuterol

## 2021-09-02 ENCOUNTER — Ambulatory Visit (HOSPITAL_COMMUNITY): Payer: Medicare Other | Attending: Cardiovascular Disease

## 2021-09-02 DIAGNOSIS — I050 Rheumatic mitral stenosis: Secondary | ICD-10-CM

## 2021-09-02 DIAGNOSIS — I350 Nonrheumatic aortic (valve) stenosis: Secondary | ICD-10-CM | POA: Diagnosis not present

## 2021-09-02 DIAGNOSIS — Z952 Presence of prosthetic heart valve: Secondary | ICD-10-CM | POA: Insufficient documentation

## 2021-09-02 LAB — ECHOCARDIOGRAM COMPLETE
AR max vel: 1.85 cm2
AV Area VTI: 1.85 cm2
AV Area mean vel: 1.89 cm2
AV Mean grad: 8 mmHg
AV Peak grad: 14 mmHg
Ao pk vel: 1.87 m/s
Area-P 1/2: 3.27 cm2
MV M vel: 5.71 m/s
MV Peak grad: 130.4 mmHg
MV VTI: 1.58 cm2
Radius: 0.5 cm
S' Lateral: 2 cm

## 2021-10-23 ENCOUNTER — Other Ambulatory Visit: Payer: Self-pay | Admitting: *Deleted

## 2021-10-23 DIAGNOSIS — I6523 Occlusion and stenosis of bilateral carotid arteries: Secondary | ICD-10-CM

## 2021-10-25 NOTE — Progress Notes (Unsigned)
Cardiology Office Note:    Date:  10/28/2021   ID:  Cheryl Hernandez, DOB September 21, 1940, MRN [id number]  PCP:  [name], MD   [place] [facility]  Cardiologist:  Sinclair Grooms, MD  Advanced Practice Provider:  No care team member to display Electrophysiologist:  None   Referring MD: [name], MD    History of Present Illness:    Cheryl Hernandez is a 81 y.o. 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   Patient has a history of NSTEMI s/p PCI to RCA in 2009. She was followed by Dr. [name] and noted  a 2-3 year hx of progressive exertional shortness of breath and fatigue. TTEs revealed presence of aortic stenosis that has gradually increased in severity. Diagnostic cath  was performed by Dr. 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 Catheterization also confirmed the presence of aortic stenosis with peak to peak and mean transvalvular gradients measured 33 and 29 mmHg respectively, corresponding to aortic valve area calculated only 0.81 cm.  Right-sided pressures were normal. Follow-up TTE 10/07/19 revealed paradoxical severe low-flow low gradient aortic stenosis.   She was evaluated by the multidisciplinary valve team and underwent successful TAVR with a 26 mm Medtronic Evolut Pro + THV via the TF approach on 12/20/19. Post operative TTE showed EF 70%, normally functioning TAVR with a mean gradient of 10.5 mmHg and no PVL. Mild-mod MR. Unfortunately, patient developed a persistent headache and blurry vision s/p TAVR. 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 She was seen by neurology and underwent full  stroke work up. She was discharged on aspirin and plavix. Also had a Zio patch which showed no atrial fibrillation. In follow up she complained of persistent visual issues and hallucinations. She was referred to Dr. Chauncy Passy with neuro psych.   On 05/02/20, patient had 19 beat run of SVT with HR 150-175 during cardiac rehab. Converted back to SR afterwards with HR 90s. Seen on 05/14/20 where she was doing overall well. She was having some daytime fatigue which she thought may have been related to her bystolic. We changed her to coreg. During our last visit on 06/28/20, we increased her coreg due to palpitations and rapid HR which were bothersome to her.  She was seen back in clinic on 08/30/20 for follow up and complained of white floaters and 2 episodes of the sensation of a "shade coming over her left eye" worrisome for amaurosis fugax. Of note these episodes occurred off plavix. She was put back on DAPT with aspirin and plavix and referred to Dr. Lovena Le for consideration of a loop and seen on 10/04/20. Plan was to repeat carotid dopplers prior to considering loop. This showed 1-39% RICA and 27-51% LICA stenosis. She was seen by Dr. Oneida Alar who offered her arch aortogram carotid angiogram for further evaluation as this would be considering looking for ulcerated plaque.  Other potential imaging would be a CT angiogram but this would be less diagnostic for ulcerated plaque. Ultimately, continued medical management was decided with 1 year follow up. She was seen by neuro on 10/19/20 who agreed for evaluating for afib. Loop recorder insertion was planned for 11/21/20  but pt cancelled due to not knowing how much it might cost. She is now wearing an apple watch that monitor for afib.  Was last seen in clinic on 05/2021 where she was doing well from a CV standpoint. Palpitations generally controlled. Tolerating the coreg.   Today, the patient overall feels okay. Patient states that she is having some chest  congestion and SOB which has been ongoing for the past several weeks. She thinks it may be related to the poor quality of the air. Symptoms are worsened by going outside but can still feel SOB when in the house as she is finding she is more winded with indoor chores. Symptoms improve with the inhaler, but do not completely resolve. Also having some associated palpitations and chest pressure during these episodes. Occasional ankle edema which is not new. No orthopnea or PND. Blood pressure has been well controlled. Tolerating medications.  Past Medical History:  Diagnosis Date   Arthritis    Asthma    CAD (coronary artery disease)    a. NSTEMI 2009 s/p DES to RCAx2.   Carotid artery disease ([place])    a. Mild-moderate - followed by [facility].   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 Burt Knack and Dr. Roxy Manns    Severe aortic stenosis     Past Surgical History:  Procedure Laterality Date   ANGIOPLASTY     CORONARY ANGIOPLASTY WITH STENT PLACEMENT     RIGHT/LEFT HEART CATH AND CORONARY ANGIOGRAPHY N/A 08/08/2019   Procedure: RIGHT/LEFT HEART CATH AND CORONARY ANGIOGRAPHY;  Surgeon: Belva Crome, MD;  Location: [place];  Service: Cardiovascular;  Laterality: N/A;   TEE WITHOUT CARDIOVERSION N/A 12/20/2019   Procedure: TRANSESOPHAGEAL ECHOCARDIOGRAM (TEE);  Surgeon: Sherren Mocha, MD;  Location: [facility];  Service: Open Heart Surgery;   Laterality: N/A;   TONSILLECTOMY     TOTAL ABDOMINAL HYSTERECTOMY     TOTAL HIP ARTHROPLASTY     TRANSCATHETER AORTIC VALVE REPLACEMENT, TRANSFEMORAL N/A 12/20/2019   Procedure: TRANSCATHETER AORTIC VALVE REPLACEMENT, TRANSFEMORAL;  Surgeon: Sherren Mocha, MD;  Location: [facility];  Service: Open Heart Surgery;  Laterality: N/A;    Current Medications: Current Meds  Medication Sig   acetaminophen (TYLENOL) 500 MG tablet Take 500 mg by mouth every 6 (six) hours as needed (for arthritis pain.).   ACETYLCARNITINE HCL PO Take by mouth daily.   albuterol (VENTOLIN HFA) 108 (90 Base) MCG/ACT inhaler Inhale 2 puffs into the lungs every 6 (six) hours as needed for wheezing or shortness of breath.   Alpha-D-Galactosidase (BEANO PO) Take 1 tablet by mouth daily as needed (flatulence/abdominal bloating.).   aspirin EC 81 MG tablet Take 81 mg by mouth every evening.   B Complex-C (B-COMPLEX WITH VITAMIN C) tablet Take 1 tablet by mouth  daily.    budesonide (ENTOCORT EC) 3 MG 24 hr capsule Take 2 capsules (6 mg total) by mouth 2 (two) times daily as needed (diarrhea).   budesonide-formoterol (SYMBICORT) 80-4.5 MCG/ACT inhaler Inhale 2 puffs into the lungs 2 (two) times daily as needed (congestion).   calcium carbonate (TUMS - DOSED IN MG ELEMENTAL CALCIUM) 500 MG chewable tablet Chew 1 tablet by mouth 3 (three) times daily as needed for heartburn.    carvedilol (COREG) 6.25 MG tablet Take 1 tablet (6.25 mg total) by mouth 2 (two) times daily with a meal.   Cholecalciferol (VITAMIN D3) 75 MCG (3000 UT) TABS Take 3,000 Units by mouth every other day.   clopidogrel (PLAVIX) 75 MG tablet Take 1 tablet (75 mg total) by mouth daily.   Coenzyme Q10 200 MG capsule Take 200 mg by mouth daily.   docusate sodium (COLACE) 100 MG capsule daily.   Evolocumab (REPATHA SURECLICK) 213 MG/ML SOAJ INJECT 1 ML INTO THE SKIN EVERY 14 DAYS   fexofenadine (ALLEGRA) 180 MG tablet Take 180 mg by mouth daily.    furosemide (LASIX) 20 MG tablet Take 1 tablet (20 mg total) by mouth daily as needed (For lower extremity swelling).   guaifenesin (HUMIBID E) 400 MG TABS tablet Take 400 mg by mouth daily.   KRILL OIL PO Take 1 capsule by mouth daily.   Magnesium Citrate 125 MG CAPS Take 125 mg by mouth daily.   metFORMIN (GLUCOPHAGE-XR) 500 MG 24 hr tablet Take 500 mg by mouth daily.   Misc Natural Products (LEG VEIN & CIRCULATION PO) Take by mouth daily.   Multiple Vitamins-Minerals (MULTIVITAMIN WITH MINERALS) tablet Take 1 tablet by mouth every other day. In the morning   nitroGLYCERIN (NITROSTAT) 0.4 MG SL tablet Place 1 tablet (0.4 mg total) under the tongue every 5 (five) minutes as needed for chest pain.   Probiotic Product (ALIGN) 4 MG CAPS Take 4 mg by mouth every other day.   Turmeric 400 MG CAPS Take by mouth daily.   zolpidem (AMBIEN) 5 MG tablet Take 5 mg by mouth at bedtime as needed for sleep.     Allergies:   Amlodipine, Cefuroxime axetil, Cephalexin, Codeine, Doxycycline, Erythromycin base, Linaclotide, Statins, and Telmisartan   Social History   Socioeconomic History   Marital status: Widowed    Spouse name: Not on file   Number of children: Not on file   Years of education: Not on file   Highest education level: Not on file  Occupational History   Occupation: retired Therapist, sports  Tobacco Use   Smoking status: Former    Packs/day: 1.00    Years: 30.00    Total pack years: 30.00    Types: Cigarettes    Quit date: 03/31/1998    Years since quitting: 23.5   Smokeless tobacco: Never   Tobacco comments:    Quit intermittently since her 41s.  Vaping Use   Vaping Use: Never used  Substance and Sexual Activity   Alcohol use: Yes    Alcohol/week: 0.0 standard drinks of alcohol    Comment: Rare.   Drug use: No   Sexual activity: Not on file  Other Topics Concern   Not on file  Social History Narrative   Originally from [place]. She has traveled to [place], [place], [place], & [place]. Previously worked  as a Marine scientist. No known TB exposure. No pets currently. No bird, hot tube, or asbestos exposure. Enjoys playing bridge.    Social Determinants of Health  Financial Resource Strain: Not on file  Food Insecurity: Not on file  Transportation Needs: Not on file  Physical Activity: Not on file  Stress: Not on file  Social Connections: Not on file     Family History: The patient's family history includes Allergies in her mother; Heart attack in her father; Hyperlipidemia in her mother; Prostate cancer in her maternal grandfather. There is no history of Lung disease.  ROS:   Please see the history of present illness.    Review of Systems  Constitutional:  Negative for chills, fever and malaise/fatigue.  HENT:  Positive for congestion. Negative for hearing loss.   Eyes:  Negative for redness.  Respiratory:  Positive for shortness of breath.   Cardiovascular:  Positive for chest pain. Negative for palpitations, orthopnea, claudication, leg swelling and PND.  Gastrointestinal:  Negative for melena, nausea and vomiting.  Genitourinary:  Negative for flank pain.  Musculoskeletal:  Negative for joint pain and myalgias.  Neurological:  Negative for dizziness and loss of consciousness.  Endo/Heme/Allergies:  Negative for polydipsia.  Psychiatric/Behavioral:  Negative for substance abuse.     EKGs/Labs/Other Studies Reviewed:    The following studies were reviewed today:  TTE 09/02/21: IMPRESSIONS   1. Left ventricular ejection fraction, by estimation, is 60 to 65%. The  left ventricle has normal function. The left ventricle has no regional  wall motion abnormalities. Left ventricular diastolic parameters are  indeterminate. Elevated left ventricular  end-diastolic pressure.   2. Right ventricular systolic function is normal. The right ventricular  size is normal. There is normal pulmonary artery systolic pressure. The  estimated right ventricular systolic pressure is 32.6 mmHg.   3. The  mitral valve is degenerative. Mild mitral valve regurgitation.  Mild mitral stenosis. The mean mitral valve gradient is 6.0 mmHg. Moderate  mitral annular calcification.   4. The aortic valve is normal in structure. Aortic valve regurgitation is  not visualized. No aortic stenosis is present.   5. The inferior vena cava is normal in size with greater than 50%  respiratory variability, suggesting right atrial pressure of 3 mmHg.   Echo 02/14/2021:  1. Left ventricular ejection fraction, by estimation, is 60 to 65%. The  left ventricle has normal function. The left ventricle has no regional  wall motion abnormalities. Left ventricular diastolic parameters are  consistent with Grade II diastolic  dysfunction (pseudonormalization).   2. Right ventricular systolic function is normal. The right ventricular  size is normal. There is normal pulmonary artery systolic pressure. The  estimated right ventricular systolic pressure is 71.2 mmHg.   3. The mitral valve is degenerative. Moderate mitral valve regurgitation.  The mean mitral valve gradient is 7.0 mmHg but MVA by VTI is 2.2 cm^2,  suspect no more than mild mitral stenosis with elevated gradient due to MR and high flow.   4. Bioprosthetic aortic valve s/p TAVR with 26 mm Medtronic Evolut Pro  THV. Mean gradient 11 mmHg. There was mild peri-valvular leakage.   5. Left atrial size was mildly dilated.   6. The inferior vena cava is normal in size with greater than 50%  respiratory variability, suggesting right atrial pressure of 3 mmHg.  Bilateral Carotid Dopplers 10/18/2020: Summary:  Right Carotid: Velocities in the right ICA are consistent with a 1-39%  stenosis.   Left Carotid: Velocities in the left ICA are consistent with a 40-59%  stenosis.   Vertebrals:  Bilateral vertebral arteries demonstrate antegrade flow.  Subclavians: Normal flow hemodynamics were seen in  bilateral subclavian arteries.   Monitor 05/2020: Cardiac monitoring  period was from 05/18/20-06/16/20. Predominant rhythm was NSR with average HR 83bpm; ranging from 55-138bpm There was no atrial fibrillation, SVT or VT detected Rare PVCs, rare SVE Overall, no significant arrhythmias or pauses detected on the monitor.  Echo 01/26/20 IMPRESSIONS   1. 26 mm Evolute Pro. Vmax 1.74 m/s, MG 7.2 mmHG, EOA 2.33 cm2, DI 0.74. Mild paravalvular leak is the 5-6 o'clock position. The aortic valve has been repaired/replaced. Aortic valve regurgitation is mild. There is a 26 mm CoreValve-Evolut Pro prosthetic (TAVR) valve present in the aortic position. Procedure Date: 12/20/2019.   2. Left ventricular ejection fraction, by estimation, is 65 to 70%. The  left ventricle has normal function. The left ventricle has no regional  wall motion abnormalities. Left ventricular diastolic function could not  be evaluated.   3. Right ventricular systolic function is normal. The right ventricular  size is normal. There is normal pulmonary artery systolic pressure. The  estimated right ventricular systolic pressure is 85.6 mmHg.   4. Left atrial size was mildly dilated.   5. Mild calcific mitral stenosis (degnerative). MVA by VTI 2.2 cm2. The  mitral valve is degenerative. Mild mitral valve regurgitation. Mild mitral  stenosis. The mean mitral valve gradient is 6.6 mmHg with average heart  rate of 83 bpm. Moderate to severe  mitral annular calcification.   6. The inferior vena cava is normal in size with greater than 50%  respiratory variability, suggesting right atrial pressure of 3 mmHg.   Comparison(s): Changes from prior study are noted. Mild PVL is now  present.    TAVR OPERATIVE NOTE     Date of Procedure:                12/20/2019   Preoperative Diagnosis:      Severe Aortic Stenosis    Postoperative Diagnosis:    Same    Procedure:        Transcatheter Aortic Valve Replacement - Percutaneous Right Transfemoral Approach             Medtronic CoreValve Evolut Pro  (size 26 mm, serial # [id number])              Co-Surgeons:                        Valentina Gu. Roxy Manns, MD and Sherren Mocha, MD   Anesthesiologist:                  Belinda Block, MD   Echocardiographer:              Sanda Klein, MD   Pre-operative Echo Findings: Severe aortic stenosis Normal left ventricular systolic function   Post-operative Echo Findings: No paravalvular leak Normal left ventricular systolic function   _____________   Cath 08/08/19: Moderate aortic stenosis with peak to peak gradient of 33 mmHg, mean gradient 29 mmHg, and calculated aortic valve area of 0.81 cm. Normal pulmonary pressures with mean arterial pressure of 23 mmHg. 3 mmHg gradient across the mitral valve Normal left ventricular systolic function with EF 65%. Widely patent left main Proximal LAD saccular aneurysm not substantially different in appearance than 2006 and 2012 angiogram comparisons.  Luminal irregularities are noted throughout the LAD territory. Luminal irregularities noted throughout the circumflex. The RCA is dominant, tortuous, and contains a patent mid to distal stent beyond several acute bends.  Diffuse luminal irregularities are noted otherwise. Scattered significant calcification  is noted in the coronary arterial tree, the thoracic aorta, and mitral annulus.   RECOMMENDATIONS:   There is discordance between aortic stenosis and coronary disease severity and symptoms.  Dyspnea on exertion may be multifactorial. LAD aneurysm is stable. Consider referral to aortic valve clinic for an opinion. May need pulmonary evaluation. May need transesophageal echo to exclude the possibility of mitral valve disease being more significant than suggested by current data.     Echo 12/21/19:  IMPRESSIONS   1. A 3.5 m/s intracavitary "gradient" is present due to hyperdynamic left  ventricular contraction. Left ventricular ejection fraction, by  estimation, is 70 to 75%. The left ventricle has  hyperdynamic function.  The left ventricle has no regional wall  motion abnormalities. Left ventricular diastolic parameters are consistent  with Grade II diastolic dysfunction (pseudonormalization). Elevated left  atrial pressure.   2. Right ventricular systolic function is normal. The right ventricular  size is normal. There is mildly elevated pulmonary artery systolic  pressure.   3. The mitral valve is normal in structure. Mild to moderate mitral valve  regurgitation. No evidence of mitral stenosis. Moderate mitral annular  calcification.   4. The aortic valve has been repaired/replaced. Aortic valve  regurgitation is not visualized. No aortic stenosis is present. There is a  CoreValve-Evolut Pro prosthetic (TAVR) valve present in the aortic  position. Procedure Date: 12/20/2019.   5. The inferior vena cava is normal in size with greater than 50%  respiratory variability, suggesting right atrial pressure of 3 mmHg.    __________________   ZIo AT 12/22/19 Study Highlights   Basic rhythm is NSR Non Sustained SVT at rates 115 to 214 bpm. Longest 11 seconds No atrial fibrillation SVT burden < 1%   _________________   Coronary CT 11/03/2019: IMPRESSION: 1. Aortic Valve: Tricuspid aortic valve. Severely reduced cusp separation. Severely thickened, moderately calcified aortic valve cusps.   2.  AV calcium score: 839   3.  LVOT calcifications extending to anterior mitral leaflet.   4. Borderline height of the left main coronary artery ostia, 11 mm. Adequate height of the RCA ostium.   5. Based on these findings, the annulus measures within range for a 20 mm Edwards Sapien 3 valve. Sizing is borderline for 23 mm vs 26 mm Medtronic Evolut Pro supra-annular valve. Recommend Structural Heart Team discussion for valve selection.   6. Optimum Fluoroscopic Angle for Delivery: LAO 1 CAU 1    EKG:    EKG is personally reviewed. 10/28/21: NSR with HR 89  Recent Labs: No results  found for requested labs within last 365 days.   Recent Lipid Panel    Component Value Date/Time   CHOL 157 12/22/2019 0841   CHOL 150 07/04/2019 0749   TRIG 151 (H) 12/22/2019 0841   HDL 67 12/22/2019 0841   HDL 68 07/04/2019 0749   CHOLHDL 2.3 12/22/2019 0841   VLDL 30 12/22/2019 0841   LDLCALC 60 12/22/2019 0841   LDLCALC 64 07/04/2019 0749     Risk Assessment/Calculations:       Physical Exam:    VS:  BP 130/72   Pulse 89   Ht _0  (1.6 m)   Wt 136 lb (61.7 kg)   BMI 24.09 kg/m     Wt Readings from Last 3 Encounters:  10/28/21 136 lb (61.7 kg)  08/30/21 136 lb 12.8 oz (62.1 kg)  07/26/21 136 lb (61.7 kg)     GEN:  Well nourished, well developed in no acute  distress HEENT: Normal NECK: No JVD; No carotid bruits CARDIAC: RRR, 2/6 early-peaking systolic murmur, new 7-2/5 diastolic murmur RESPIRATORY:  Clear to auscultation without rales, wheezing or rhonchi  ABDOMEN: Soft, non-tender, non-distended MUSCULOSKELETAL:  No edema; No deformity  SKIN: Warm and dry NEUROLOGIC:  Alert and oriented x 3 PSYCHIATRIC:  Normal affect   ASSESSMENT:    1. Severe aortic stenosis   2. Diastolic murmur   3. S/P TAVR (transcatheter aortic valve replacement)   4. Amaurosis fugax of left eye   5. Essential hypertension   6. Bilateral carotid artery stenosis   7. Mitral valve stenosis, unspecified etiology   8. Coronary artery disease of native artery of native heart with stable angina pectoris ([place])   9. SVT (supraventricular tachycardia) ([place])   10. 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 SOB (shortness of breath)     PLAN:    In order of problems listed above:  #SOB: Patient with worsening SOB that usually occurs when walking outside. Attributes it to the poor air quality with the fires. States symptoms improve when going indoors and with use of her inhaler. Continues to have some dyspnea with household chores, however, which is worse  from prior. Given new diastolic murmur on exam and symptoms, will repeat limited TTE to further assess AoV prosthesis to ensure no interval change. -Repeat TTE to assess further -Continue home inhalers  #Concern for Amaurosis Fugax: No recurrence. Patient with 2 episodes in 2022 of the sensation of a "shade coming over her left eye" that lasted for several hours before resolving. Symptoms highly concerning for amaurosis fugax. Multiple monitors with no evidence of Afib, however, patient has frequent palpitations. TTE 01/2021 with EF 60%, normally functioning TAVR with a mean gradient of 8 mmHg and mild PVL. There is mod MR/mild MS and mild TR. Given concern for amaurosis fugax, we referred for loop but patient declined due to cost. Now monitoring with apple watch.  -Continue apple watch -Continue plavix 62m daily  -Continue ASA 839mdaily -Continue repatha 14056m2weeks  #SVT: #Palpitations: Patient with 19 beat episode of SVT on monitor while at cardiac rehab. Has known history of SVT. Cardiac monitor 05/2021 with no episodes of Afib but some runs of SVT. Now monitoring with apple watch as detailed above. -Continue coreg 6.91m76mD  #Severe, paradoxical AS s/p TAVR 9/213/66/44ew Diastolic Murmur: TTE 06/203/4742h LVEF 60-65% with normal functioning TAVR with mean gradient 8mmH79mnd mild PVL. NYHA class II symptoms. On exam today, was noted to have a new diastolic murmur. Will repeat limited TTE for monitoring. -Repeat limited TTE given diastolic murmur on exam -Follow-up with structural heart team as scheduled -Continue ASA 81mg 32my -Continue plavix 75mg d32m as above -SBE ppx prescribed  #Mild MS #Severe MAC: -Continue coreg 6.91mg BI78merial TTEs for monitoring   #Post-Op CVA: Symptoms resolved.  -Continue ASA 81mg dai75mnd plavix 75mg dail38m above   #Carotid Artery Disease: Most recent dopplers showed 40-59% LIC59-56%osis.  -Continue ASA 81mg daily70m plavix 75mg  daily31mabove -On repatha 140mg q2weeks2mpeat carotids scheduled for 10/23/21   #CAD s/p RCA PCI in the past: No anginal symptoms. -Continue ASA 81mg daily an40mavix 75mg daily as 82me -Continue coreg 6.91mg BID -Conti58mlosartan 91mg daily   #HT76mLosartan 91mg daily -Conti76mcoreg 6.91mg BID  #Mild LE13mma: -Start lasix 20mg prn as needed 66mllow-up:  6 months.  Medication Adjustments/Labs and  Tests Ordered: Current medicines are reviewed at length with the patient today.  Concerns regarding medicines are outlined above.   Orders Placed This Encounter  Procedures   EKG 12-Lead   ECHOCARDIOGRAM LIMITED   Meds ordered this encounter  Medications   furosemide (LASIX) 20 MG tablet    Sig: Take 1 tablet (20 mg total) by mouth daily as needed (For lower extremity swelling).    Dispense:  30 tablet    Refill:  3   Patient Instructions  Medication Instructions:   START TAKING FUROSEMIDE (LASIX) 20 MG BY MOUTH DAILY AS NEEDED FOR LOWER EXTREMITY SWELLING  *If you need a refill on your cardiac medications before your next appointment, please call your pharmacy*   Testing/Procedures:  Your physician has requested that you have an LIMITED echocardiogram. Echocardiography is a painless test that uses sound waves to create images of your heart. It provides your doctor with information about the size and shape of your heart and how well your heart's chambers and valves are working. This procedure takes approximately one hour. There are no restrictions for this procedure. SCHEDULE THIS TO BE DONE NOW    Follow-Up:  3 MONTHS WITH DR. Johney Frame    Important Information About Sugar        Signed, Freada Bergeron, MD  10/28/2021 11:05 AM    [place]

## 2021-10-28 ENCOUNTER — Encounter: Payer: Self-pay | Admitting: Cardiology

## 2021-10-28 ENCOUNTER — Ambulatory Visit: Payer: Medicare Other | Admitting: Cardiology

## 2021-10-28 VITALS — BP 130/72 | HR 89 | Ht 63.0 in | Wt 136.0 lb

## 2021-10-28 DIAGNOSIS — I38 Endocarditis, valve unspecified: Secondary | ICD-10-CM | POA: Diagnosis not present

## 2021-10-28 DIAGNOSIS — G453 Amaurosis fugax: Secondary | ICD-10-CM | POA: Diagnosis not present

## 2021-10-28 DIAGNOSIS — I6523 Occlusion and stenosis of bilateral carotid arteries: Secondary | ICD-10-CM

## 2021-10-28 DIAGNOSIS — I350 Nonrheumatic aortic (valve) stenosis: Secondary | ICD-10-CM | POA: Diagnosis not present

## 2021-10-28 DIAGNOSIS — R0602 Shortness of breath: Secondary | ICD-10-CM

## 2021-10-28 DIAGNOSIS — I6310 Cerebral infarction due to embolism of unspecified precerebral artery: Secondary | ICD-10-CM

## 2021-10-28 DIAGNOSIS — Z952 Presence of prosthetic heart valve: Secondary | ICD-10-CM | POA: Diagnosis not present

## 2021-10-28 DIAGNOSIS — I10 Essential (primary) hypertension: Secondary | ICD-10-CM

## 2021-10-28 DIAGNOSIS — I25118 Atherosclerotic heart disease of native coronary artery with other forms of angina pectoris: Secondary | ICD-10-CM

## 2021-10-28 DIAGNOSIS — I050 Rheumatic mitral stenosis: Secondary | ICD-10-CM

## 2021-10-28 DIAGNOSIS — I471 Supraventricular tachycardia: Secondary | ICD-10-CM

## 2021-10-28 MED ORDER — FUROSEMIDE 20 MG PO TABS: 20.0000 mg | ORAL_TABLET | Freq: Every day | ORAL | 3 refills | Status: DC | PRN

## 2021-10-28 NOTE — Patient Instructions (Signed)
Medication Instructions:   START TAKING FUROSEMIDE (LASIX) 20 MG BY MOUTH DAILY AS NEEDED FOR LOWER EXTREMITY SWELLING  *If you need a refill on your cardiac medications before your next appointment, please call your pharmacy*   Testing/Procedures:  Your physician has requested that you have an LIMITED echocardiogram. Echocardiography is a painless test that uses sound waves to create images of your heart. It provides your doctor with information about the size and shape of your heart and how well your heart's chambers and valves are working. This procedure takes approximately one hour. There are no restrictions for this procedure. SCHEDULE THIS TO BE DONE NOW    Follow-Up:  3 MONTHS WITH DR. Johney Frame    Important Information About Sugar

## 2021-11-05 ENCOUNTER — Telehealth: Payer: Self-pay | Admitting: Cardiology

## 2021-11-05 NOTE — Telephone Encounter (Signed)
Spoke with the patient who states that she is feeling much better. Heart rate has come down. She does not feel like she needs her appointment tomorrow. She will call back if symptoms reoccur.

## 2021-11-05 NOTE — Telephone Encounter (Signed)
Pt called back stating she took her carvedilol and she is feeling better, she wants to know does she still need her appt tomorrow.

## 2021-11-05 NOTE — Telephone Encounter (Signed)
Patient c/o Palpitations:  High priority if patient c/o lightheadedness, shortness of breath, or chest pain  How long have you had palpitations/irregular HR/ Afib? Are you having the symptoms now? Worsened today since last appt on 07/31, not actively having symptoms now  Are you currently experiencing lightheadedness, SOB or CP? No   Do you have a history of afib (atrial fibrillation) or irregular heart rhythm? No   Have you checked your BP or HR? (document readings if available): 11/05/21 145/71 HR 80 around 1:00-1:30 pm  Are you experiencing any other symptoms? SOB and fatigue when palp episode occurs.    Episode of irregular heartbeat started around 12:30 pm, HR was 100. Reporting symptoms stop if she stays very still and calm. Patient is wanting an appointment with an EKG.

## 2021-11-05 NOTE — Telephone Encounter (Signed)
Spoke with the patient who reports that she has been having an irregular heart rate for a few hours now. She reports that she can feel her heart racing whenever she gets up and she becomes short of breath. She feels better at rest but still not great. Heart rate has been fluctuating between 80-103. She is wearing an apple watch that is showing that her rhythm is irregular however it is showing "inconclusive". She is having some lightheadedness as well. She reports that she is staying hydrated. She did take her morning dose of carvedilol. I have advised her to go ahead and take her evening dose. Encouraged her to continue to rest and stay hydrated. She will call us back if symptoms do not improve. She has an appointment with Ermalinda Barrios, PA_C tomorrow 8/9.

## 2021-11-06 ENCOUNTER — Ambulatory Visit: Payer: Medicare Other | Admitting: Physician Assistant

## 2021-11-13 ENCOUNTER — Ambulatory Visit (HOSPITAL_COMMUNITY): Payer: Medicare Other | Attending: Cardiology

## 2021-11-13 DIAGNOSIS — I38 Endocarditis, valve unspecified: Secondary | ICD-10-CM | POA: Diagnosis present

## 2021-11-13 DIAGNOSIS — Z952 Presence of prosthetic heart valve: Secondary | ICD-10-CM | POA: Insufficient documentation

## 2021-11-13 DIAGNOSIS — R011 Cardiac murmur, unspecified: Secondary | ICD-10-CM

## 2021-11-13 LAB — ECHOCARDIOGRAM LIMITED
AV Mean grad: 11 mmHg
AV Peak grad: 16.4 mmHg
Ao pk vel: 2.03 m/s
Area-P 1/2: 2.81 cm2
P 1/2 time: 623 msec
S' Lateral: 2 cm

## 2021-11-14 ENCOUNTER — Telehealth: Payer: Self-pay | Admitting: Internal Medicine

## 2021-11-14 NOTE — Telephone Encounter (Signed)
Calling in with questions concerning her echo. Please advise

## 2021-11-14 NOTE — Telephone Encounter (Signed)
Returned call to patient to discuss results of echo.  Per Dr. Gasper Sells: This is Dr. Gasper Sells covering for Dr. Johney Frame. Your results show that your replaced valve looks ok. Your mitral valve looks a bit leakier that in the past.  Given your breathing issues from 10/28/21, a trial of the fluid pill daily may help some of your symptoms.  If you are amenable to this we will try the fluid pill and get some labs for Dr. Mamie Nick later. Please contact our team for any questions or concerns.  Patient states she has been taking Lasix '20mg'$  QD already for the past week and reports no improvement in SOB.   Patient states she called last week and spoke with triage nurse regarding palpitations. See phone note from 11/05/21. An appt was made for patient to see Ermalinda Barrios, PA-C for SOB/palpitations on 8/9, patient cancelled appt because she felt better.  Patient states she has been taking her Carvedilol every 4 hours instead of BID due to palpitations. She reports she started doing this on her own and found it helps.  Patient repeated throughout the phone call that since her visit with Dr. Johney Frame on 10/28/21 she has had increased SOB and palpitations with any kind of activity and it is very limiting.  Advised patient to come in for evaluation by provider as both Dr. Johney Frame and Dr. Gasper Sells are out of the office until end of next week, and symptoms are worse. Offered next available appt with Nicholes Rough, PA-C on 8/18, patient accepted and will see provider tomorrow for further assessment and management of SOB/palpitations.

## 2021-11-14 NOTE — Progress Notes (Deleted)
Office Visit    Patient Name: Cheryl Hernandez Date of Encounter: 11/14/2021  PCP:  [name], MD   [place]  Cardiologist:  Sinclair Grooms, MD  Advanced Practice Provider:  No care team member to display Electrophysiologist:  None   HPI    Cheryl Hernandez is a 81 y.o. 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  History includes NSTEMI status post PCI to RCA 2009.  She been followed by Dr. [name] and noted a 2 to 3-year history of progressive exertional shortness of breath and fatigue.  TTE revealed presence of aortic stenosis that had gradually increased in severity.  Diagnostic cath was performed by Dr. 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  Catheterization also confirmed presence of aortic stenosis with peak to peak and mean transvalvular gradients measuring 33 and 29 mmHg respectively, corresponding to aortic valve area calculated 0.81 cm.  Right-sided pressures were normal.  Follow-up TTE 10/07/2019 revealed paradoxical severe low-flow low gradient aortic stenosis.  After evaluation by the multidisciplinary valve team she underwent successful TAVR with a 26 mm Medtronic Evolut Pro + THV via the TF approach on 12/20/2019.  Unfortunately her surgery was complicated with persistent headache and blurry vision status post TAVR.  MRI of the brain was positive for multifocal acute infarcts.  She was seen by neurology underwent full stroke work-up.  She was discharged on ASA/Plavix.  Also had a Zio patch at that time which showed no atrial fibrillation.  On  05/02/2020 patient had an 19 beat run of SVT with heart rate 1 50-1 75 during cardiac rehab.  Converted back to sinus rhythm afterwards, heart rate in the 90s.  Seen 05/14/2020 where she was doing overall well.  She had some daytime fatigue thought it might related to her Bystolic.  She was changed to Coreg.  She was seen 06/28/2020 and her Coreg was increased due to palpitations and rapid heart rate.  She was then seen back in the clinic 08/30/2020 for follow-up where she complained of white floaters and 2 episodes of sensations of (shade coming over her left eye) worrisome for amaurosis fugax.  Of note, these episodes occurred off of Plavix.  She was placed back on DAPT with aspirin/Plavix and referred to Dr. Lovena Le for consideration of a loop [place] in 10/04/2020.  Plan was to repeat carotid Dopplers prior to considering loop.  Carotid showed 1 to 39% R ICA and 40 to 09% LICA stenosis.  She was seen by Dr. Oneida Alar who offered her arch arteriogram carotid angiogram for further evaluation.  Ultimately, continued medical management was decided with 1 year follow-up.  She was seen by neuro 10/19/2020 who agreed for evaluating for atrial fibrillation.  Loop recorder insertion was planned for 11/21/2020 but patient canceled due to not knowing the cost.  She was wearing an Apple Watch and monitoring for atrial fibrillation.  Seen in the clinic 2/23 where she was doing well from a CV standpoint.  Palpitations were generally well controlled.  She was tolerating Coreg.  She was last seen by Dr. Johney Frame 10/28/2021 and she was feeling well at  that time.  She had some chest congestion and SOB which has been going on the past several weeks.  She thinks it may related to poor quality of the air.  Symptoms are worse by going outside but she could still feel short of breath at her house.  Symptoms improved with inhaler but not completely resolved.  She is also having some associated palpitations and chest pressure during these episodes.   Occasionally she was having some ankle edema which was not new.  Her echocardiogram was done yesterday and showed no PVL, her TAVR valve looked okay.  She had at least moderate MR.  The plan was to increase her Lasix 20 mg p.o. daily, and Dr. Gasper Sells wanted a BMP, BNP, and magnesium done in 2 weeks.  Today, she ***  Past Medical History    Past Medical History:  Diagnosis Date   Arthritis    Asthma    CAD (coronary artery disease)    a. NSTEMI 2009 s/p DES to RCAx2.   Carotid artery disease ([place])    a. Mild-moderate - followed by [facility].   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 Burt Knack and Dr. Roxy Manns    Severe aortic stenosis    Past Surgical History:  Procedure Laterality Date   ANGIOPLASTY     CORONARY ANGIOPLASTY WITH STENT PLACEMENT     RIGHT/LEFT HEART CATH AND CORONARY ANGIOGRAPHY N/A 08/08/2019   Procedure: RIGHT/LEFT HEART CATH AND CORONARY ANGIOGRAPHY;  Surgeon: Belva Crome, MD;  Location: [place] [facility];  Service: Cardiovascular;  Laterality: N/A;   TEE WITHOUT CARDIOVERSION N/A 12/20/2019   Procedure: TRANSESOPHAGEAL ECHOCARDIOGRAM (TEE);  Surgeon: Sherren Mocha, MD;  Location: [place] [facility];  Service: Open Heart Surgery;  Laterality: N/A;   TONSILLECTOMY     TOTAL ABDOMINAL HYSTERECTOMY     TOTAL HIP ARTHROPLASTY     TRANSCATHETER AORTIC VALVE REPLACEMENT, TRANSFEMORAL N/A 12/20/2019   Procedure: TRANSCATHETER  AORTIC VALVE REPLACEMENT, TRANSFEMORAL;  Surgeon: Sherren Mocha, MD;  Location: [place] [facility];  Service: Open Heart Surgery;  Laterality: N/A;    Allergies  Allergies  Allergen Reactions   Amlodipine Swelling   Cefuroxime Axetil Hives and Itching   Cephalexin Other (See Comments)    Total body pain in joints, couldn't swallow    Codeine     Tolerates Dilaudid.   Doxycycline     REACTION: vomiting/ GI upset   Erythromycin Base Other (See Comments)    unknown   Linaclotide     Other reaction(s): Diarrhea   Statins     Myalgia with crestor   Telmisartan     Other reaction(s): Unknown    History of Present Illness    Netty A Hilgert is a 81 y.o. female with a hx of *** last seen ***.  EKGs/Labs/Other Studies Reviewed:   The following studies were reviewed today: ***  EKG:  EKG is *** ordered today.  The ekg ordered today demonstrates ***  Recent Labs: No results found for requested labs within last 365 days.  Recent Lipid Panel    Component Value Date/Time   CHOL 157 12/22/2019 0841   CHOL 150 07/04/2019 0749   TRIG 151 (H) 12/22/2019 0841   HDL 67 12/22/2019 0841   HDL 68 07/04/2019 0749   CHOLHDL 2.3 12/22/2019 0841   VLDL 30 12/22/2019 0841   LDLCALC 60 12/22/2019 0841   LDLCALC 64 07/04/2019 0749    Risk Assessment/Calculations:  {Does this patient have ATRIAL FIBRILLATION?:[id number]}  Home Medications   No outpatient medications have been marked as taking for the 11/15/21 encounter (Appointment) with Elgie Collard, PA-C.     Review of Systems   ***   All other systems reviewed and are otherwise negative except as noted above.  Physical Exam    VS:  There were no vitals taken for this visit. , BMI There is no height or weight on file to calculate BMI.  Wt Readings from Last 3 Encounters:  10/28/21 136 lb (61.7 kg)  08/30/21 136 lb 12.8 oz (62.1 kg)  07/26/21 136 lb (61.7 kg)     GEN: Well nourished, well developed, in no acute  distress. HEENT: normal. Neck: Supple, no JVD, carotid bruits, or masses. Cardiac: ***RRR, no murmurs, rubs, or gallops. No clubbing, cyanosis, edema.  ***Radials/PT 2+ and equal bilaterally.  Respiratory:  ***Respirations regular and unlabored, clear to auscultation bilaterally. GI: Soft, nontender, nondistended. MS: No deformity or atrophy. Skin: Warm and dry, no rash. Neuro:  Strength and sensation are intact. Psych: Normal affect.  Assessment & Plan    SOB Concern amaurosis fugax SVT/palpitations Severe, paradoxical AS s/p TAVR 8/89/16/XIH diastolic murmur -Diastolic murmur is likely mild to moderate mitral stenosis.  Ejection fraction 65 to 70%.  Grade 2 DD.  Bioprosthetic aortic valve status post TAVR.  Aortic valve mean gradient measures 11 mmHg, aortic valve peak gradient measures 16.4 mmHg Postop CVA Carotid artery disease CAD status post RCA PCI in the past Mild lower extremity edema Hypertension  No BP recorded.  {Refresh Note OR Click here to enter BP  :1}***      Disposition: Follow up {follow up:15908} with Sinclair Grooms, MD or APP.  Signed, Elgie Collard, PA-C 11/14/2021, 6:48 PM [facility]

## 2021-11-15 ENCOUNTER — Telehealth: Payer: Self-pay | Admitting: *Deleted

## 2021-11-15 ENCOUNTER — Ambulatory Visit: Payer: Medicare Other | Admitting: Physician Assistant

## 2021-11-15 DIAGNOSIS — I050 Rheumatic mitral stenosis: Secondary | ICD-10-CM

## 2021-11-15 DIAGNOSIS — Z952 Presence of prosthetic heart valve: Secondary | ICD-10-CM

## 2021-11-15 DIAGNOSIS — R0602 Shortness of breath: Secondary | ICD-10-CM

## 2021-11-15 DIAGNOSIS — I25118 Atherosclerotic heart disease of native coronary artery with other forms of angina pectoris: Secondary | ICD-10-CM

## 2021-11-15 DIAGNOSIS — I38 Endocarditis, valve unspecified: Secondary | ICD-10-CM

## 2021-11-15 DIAGNOSIS — Z79899 Other long term (current) drug therapy: Secondary | ICD-10-CM

## 2021-11-15 DIAGNOSIS — I6523 Occlusion and stenosis of bilateral carotid arteries: Secondary | ICD-10-CM

## 2021-11-15 DIAGNOSIS — I471 Supraventricular tachycardia: Secondary | ICD-10-CM

## 2021-11-15 DIAGNOSIS — I6310 Cerebral infarction due to embolism of unspecified precerebral artery: Secondary | ICD-10-CM

## 2021-11-15 DIAGNOSIS — R002 Palpitations: Secondary | ICD-10-CM

## 2021-11-15 DIAGNOSIS — I10 Essential (primary) hypertension: Secondary | ICD-10-CM

## 2021-11-15 DIAGNOSIS — I350 Nonrheumatic aortic (valve) stenosis: Secondary | ICD-10-CM

## 2021-11-15 MED ORDER — FUROSEMIDE 20 MG PO TABS: 20.0000 mg | ORAL_TABLET | Freq: Every day | ORAL | 1 refills | Status: DC

## 2021-11-15 NOTE — Telephone Encounter (Signed)
-----   Message from Werner Lean, MD sent at 11/14/2021 10:43 AM EDT ----- Results: Diastolic murmur is likely MS; no PVL- TAVR looks ok At least Moderate MR Plan: Increase to lasix 20 mg PO daily, BMP BNP and Mg in two weeks to Dr. Lilli Few, MD

## 2021-11-15 NOTE — Telephone Encounter (Signed)
The patient has been notified of the result and verbalized understanding.  All questions (if any) were answered.  Pt aware that she needs to increase her lasix to 20 mg po daily and come in for repeat labs to recheck BMET, PRO-BNP, and Mg level in 2 weeks.  Confirmed the pharmacy of choice with the pt.  Scheduled her for repeat labs in 2 weeks on 11/29/21.  Pt verbalized understanding and agrees with this plan.   Off note:  Pt states she wants to cancel her follow-up appt with Nicholes Rough PA-C for today at 1:55 pm.  She states she doesn't think she needs this appt for she feels fine from a cardiovascular standpoint and isn't having anymore palpitations.  Advised her to keep this and she demanded I cancel this appt.  Advised her to keep [place] posted as needed if symptoms develop.  We will see her as planned for follow-up in Oct.  Pt verbalized understanding and agrees with this plan.

## 2021-11-18 ENCOUNTER — Telehealth: Payer: Self-pay | Admitting: Cardiology

## 2021-11-18 NOTE — Telephone Encounter (Signed)
Pt would like to reschedule her appt with our office now for this week.  She cancelled her appt with Nicholes Rough PA-C for tomorrow on last Friday, stating she was having no cardiac symptoms and felt like the appt wasn't needed at that time.   Pt states as of today her PVC's have returned and she would like to be assessed for that.   She states when she feels her PVC's, this brings mild chest discomfort.  She states this happens in 4 hour increments.  Pt denies any sob, dizziness, diaphoresis, orthopnea, pre-syncopal or syncopal episodes.   Rescheduled her to see Ambrose Pancoast NP on tomorrow 8/22 at 0800.  She is aware to arrive 15 mins prior to this visit.   Advised her to bring in any BP/HR recordings at that time if she has them.  ED precautions provided to the pt if symptoms were to worsen or persist in the meantime between now and her appt tomorrow morning.   Advised her to proceed with current regimen until then, avoid stimulants like chocolate and caffeine.  Reduce sodium and wear compressions during the day.   Pt states she would like to utilize the visit with Jaquelyn Bitter tomorrow, to discuss med management and possibly increasing coreg or starting a regimen as needed for breakthrough palpitations.   Pt aware that med management will be discussed at the visit tomorrow.   Pt aware Dr. Johney Frame is out of the office until later this week, but I will send this information to her to make her aware of this plan. Pt verbalized understanding and agrees with his plan.

## 2021-11-18 NOTE — Telephone Encounter (Signed)
Pt c/o medication issue:  1. Name of Medication:   carvedilol (COREG) 6.25 MG tablet    2. How are you currently taking this medication (dosage and times per day)?  Take 1 tablet (6.25 mg total) by mouth 2 (two) times daily with a meal  3. Are you having a reaction (difficulty breathing--STAT)? NO  4. What is your medication issue? Pt states that medication only holds her for an 4 hours. Pt would like to know if she can take an extra tablet or can something else be suggested. Please advise

## 2021-11-18 NOTE — Progress Notes (Unsigned)
Office Visit    Patient Name: Cheryl Hernandez Date of Encounter: 11/19/2021  Primary Care Provider:  [name], MD Primary Cardiologist:  Sinclair Grooms, MD Primary Electrophysiologist: None  Chief Complaint    Cheryl Hernandez is a 81 y.o. 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  Past Medical History    Past Medical History:  Diagnosis Date   Arthritis    Asthma    CAD (coronary artery disease)    a. NSTEMI 2009 s/p DES to RCAx2.   Carotid artery disease ([place])    a. Mild-moderate - followed by [facility].   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 Burt Knack and Dr. Roxy Manns    Severe aortic stenosis    Past Surgical History:  Procedure Laterality Date   ANGIOPLASTY     CORONARY ANGIOPLASTY WITH STENT PLACEMENT     RIGHT/LEFT HEART CATH AND CORONARY ANGIOGRAPHY N/A 08/08/2019   Procedure: RIGHT/LEFT HEART CATH AND CORONARY ANGIOGRAPHY;  Surgeon: Belva Crome, MD;  Location: [place] [facility];  Service: Cardiovascular;  Laterality: N/A;   TEE WITHOUT CARDIOVERSION N/A 12/20/2019   Procedure: TRANSESOPHAGEAL ECHOCARDIOGRAM (TEE);  Surgeon: Sherren Mocha, MD;  Location: [place] [facility];  Service: Open Heart Surgery;  Laterality: N/A;   TONSILLECTOMY     TOTAL ABDOMINAL  HYSTERECTOMY     TOTAL HIP ARTHROPLASTY     TRANSCATHETER AORTIC VALVE REPLACEMENT, TRANSFEMORAL N/A 12/20/2019   Procedure: TRANSCATHETER AORTIC VALVE REPLACEMENT, TRANSFEMORAL;  Surgeon: Sherren Mocha, MD;  Location: [place] [facility];  Service: Open Heart Surgery;  Laterality: N/A;    Allergies  Allergies  Allergen Reactions   Amlodipine Swelling   Cefuroxime Axetil Hives and Itching   Cephalexin Other (See Comments)    Total body pain in joints, couldn't swallow    Codeine     Tolerates Dilaudid.   Doxycycline     REACTION: vomiting/ GI upset   Erythromycin Base Other (See Comments)    unknown   Linaclotide     Other reaction(s): Diarrhea   Statins     Myalgia with crestor   Telmisartan     Other reaction(s): Unknown    History of Present Illness    Cheryl Hernandez is a 81 year old female with the above-mentioned past medical history who presents today for complaint of PVCs.  Cheryl Hernandez has a significant cardiac history with NSTEMI in 2009 that was treated with PCI to RCA.  She had diagnostic cath performed 07/2019 that revealed widely patent coronary arteries and stents.  Catheterization also revealed presence of aortic stenosis with peak to peak and mean transvalvular gradient of  33 and 29 mmHg.  2D echo was performed 09/2019 with EF of 60-65%, with severe or aortic valve stenosis.  She was referred to the structural heart team and underwent full work-up for TAVR.  She completed a Lexiscan stress test for complaint of shortness of breath and chest pressure.  The test revealed normal resting and stress perfusion and EF of 73% with low risk study.  She had carotid Dopplers performed bilaterally in 11/2016 that revealed left internal carotid velocities of 40-59% stenosis and 1-39% on the right.  In 11/2019 she had TAVR completed that was successful but patient developed multifocal acute infarcts via MRI of the brain.  She underwent full stroke work-up and was discharged on ASA and  Plavix.   Patient had a 19 beat run of SVT with a rate of 150-175 bpm during cardiac rehab.  She converted to sinus rhythm with rate of 90 spontaneously.  Patient was asymptomatic during event and followed up with Dr. Johney Frame in 05/2020.  She wore 30-day monitor with no sustained arrhythmias and carvedilol was increased to 6.25 mg twice daily.  She presented for clinic visit on 08/30/2020 with complaints of floaters and and sensation of shade coming over her left eye.  These episodes occurred while off of Plavix and patient was placed back on DAPT.  She was considered for loop recorder by Dr. Lovena Le but patient canceled due to possible increased cost.  She currently wears Apple Watch that can atrial fibrillation.  She was seen in 7/ 2023 and stated that palpitations were generally controlled.  She did endorse worrisome shortness of breath during visit and also had new diastolic murmur on examination.  2D echo was performed that revealed normal pumping function with mild to moderate MV stenosis.  Most recently on 10/2021 2D echo completed that revealed peak RV-RA gradient of 42 mmHg, degenerative mitral valve with regurgitation and mild to moderate mitral stenosis with mean gradient of 6 mmHg.  She was also started on Lasix as needed for mild lower extremity edema. She contacted office on 11/05/2021 with complaint of palpitations and irregular heart rate.  She endorsed heart rate between 80 and 103 bpm with lightheadedness.  She was encouraged to take an extra dose of carvedilol with decreasing palpitation burden.  Cheryl Hernandez presents today for evaluation of PVCs.  She is here alone for her visit.  She states that her PVCs have been very frequent and was able to provide proof of on her Apple Watch.  Her EKG today showed very minimal PVCs however Apple watch showed frequent bouts of bigeminy and trigeminy.  She endorses fatigue with these PVCs and states that she is not able to work in her garden when they are  increased.  She is euvolemic on examination and lower extremity swelling is currently resolved with increased Lasix.  She is performing daily weights and her weight is similar to previous weight and July at the office.  Patient denies chest pain, palpitations, dyspnea, PND, orthopnea, nausea, vomiting, dizziness, syncope, edema, weight gain, or early satiety.   Home Medications    Current Outpatient Medications  Medication Sig Dispense Refill   acetaminophen (TYLENOL) 500 MG tablet Take 500 mg by mouth every 6 (six) hours as needed (for arthritis pain.).     ACETYLCARNITINE HCL PO Take by mouth daily.     albuterol (VENTOLIN HFA) 108 (90 Base) MCG/ACT inhaler Inhale 2 puffs into the lungs every 6 (six) hours as needed for wheezing or shortness of breath. [place]  g 3   Alpha-D-Galactosidase (BEANO PO) Take 1 tablet by mouth daily as needed (flatulence/abdominal bloating.).     aspirin EC 81 MG tablet Take 81 mg by mouth every evening.     B Complex-C (B-COMPLEX WITH VITAMIN C) tablet Take 1 tablet by mouth daily.      budesonide (ENTOCORT EC) 3 MG 24 hr capsule Take 2 capsules (6 mg total) by mouth 2 (two) times daily as needed (diarrhea). 360 capsule 2   budesonide-formoterol (SYMBICORT) 80-4.5 MCG/ACT inhaler Inhale 2 puffs into the lungs 2 (two) times daily as needed (congestion). 10.2 g 11   calcium carbonate (TUMS - DOSED IN MG ELEMENTAL CALCIUM) 500 MG chewable tablet Chew 1 tablet by mouth 3 (three) times daily as needed for heartburn.      Cholecalciferol (VITAMIN D3) 75 MCG (3000 UT) TABS Take 3,000 Units by mouth every other day.     clopidogrel (PLAVIX) 75 MG tablet Take 1 tablet (75 mg total) by mouth daily. 90 tablet 3   Coenzyme Q10 200 MG capsule Take 200 mg by mouth daily.     docusate sodium (COLACE) 100 MG capsule daily.     Evolocumab (REPATHA SURECLICK) 025 MG/ML SOAJ INJECT 1 ML INTO THE SKIN EVERY 14 DAYS 6 mL 3   fexofenadine (ALLEGRA) 180 MG tablet Take 180 mg by mouth daily.      furosemide (LASIX) 20 MG tablet Take 1 tablet (20 mg total) by mouth daily. 90 tablet 1   Glucose Blood (FREESTYLE LITE TEST VI) Once daily     guaifenesin (HUMIBID E) 400 MG TABS tablet Take 400 mg by mouth daily.     KRILL OIL PO Take 1 capsule by mouth daily.     Magnesium Citrate 125 MG CAPS Take 125 mg by mouth daily.     metFORMIN (GLUCOPHAGE-XR) 500 MG 24 hr tablet Take 500 mg by mouth daily.     Misc Natural Products (LEG VEIN & CIRCULATION PO) Take by mouth daily.     Multiple Vitamins-Minerals (MULTIVITAMIN WITH MINERALS) tablet Take 1 tablet by mouth every other day. In the morning     nitroGLYCERIN (NITROSTAT) 0.4 MG SL tablet Place 1 tablet (0.4 mg total) under the tongue every 5 (five) minutes as needed for chest pain. 25 tablet 3   Probiotic Product (ALIGN) 4 MG CAPS Take 4 mg by mouth every other day.     Turmeric 400 MG CAPS Take by mouth daily.     zolpidem (AMBIEN) 5 MG tablet Take 5 mg by mouth at bedtime as needed for sleep.     ALPRAZolam (XANAX) 0.25 MG tablet Take 0.5 tablets by mouth as needed.     No current facility-administered medications for this visit.     Review of Systems  Please see the history of present illness.    (+) Fatigue (+) Palpitations  All other systems reviewed and are otherwise negative except as noted above.  Physical Exam    Wt Readings from Last 3 Encounters:  11/19/21 136 lb 3.2 oz (61.8 kg)  10/28/21 136 lb (61.7 kg)  08/30/21 136 lb 12.8 oz (62.1 kg)   VS: Vitals:   11/19/21 0818  BP: (!) 146/68  Pulse: 84  SpO2: 95%  ,Body mass index is 23.75 kg/m.  Constitutional:      Appearance: Healthy appearance. Not in distress.  Neck:     Vascular: JVD normal.  Pulmonary:     Effort: Pulmonary effort is normal.  Breath sounds: No wheezing. No rales. Diminished in the bases Cardiovascular:     Normal rate. Regular rhythm. Normal S1. Normal S2.      Murmurs: There is no murmur.  Edema:    Peripheral edema absent.   Abdominal:     Palpations: Abdomen is soft non tender. There is no hepatomegaly.  Skin:    General: Skin is warm and dry.  Neurological:     General: No focal deficit present.     Mental Status: Alert and oriented to person, place and time.     Cranial Nerves: Cranial nerves are intact.  EKG/LABS/Other Studies Reviewed    ECG personally reviewed by me today -sinus rhythm with occasional PVC and rate of 84 with no acute changes  Lab Results  Component Value Date   WBC 10.3 12/22/2019   HGB 11.3 (L) 12/22/2019   HCT 35.7 (L) 12/22/2019   MCV 97.0 12/22/2019   PLT 207 12/22/2019   Lab Results  Component Value Date   CREATININE 0.72 03/02/2020   BUN 16 03/02/2020   NA 139 03/02/2020   K 4.6 03/02/2020   CL 102 03/02/2020   CO2 22 03/02/2020   Lab Results  Component Value Date   ALT 17 12/16/2019   AST 21 12/16/2019   ALKPHOS 67 12/16/2019   BILITOT 0.6 12/16/2019   Lab Results  Component Value Date   CHOL 157 12/22/2019   HDL 67 12/22/2019   LDLCALC 60 12/22/2019   TRIG 151 (H) 12/22/2019   CHOLHDL 2.3 12/22/2019    Lab Results  Component Value Date   HGBA1C 6.0 (H) 12/16/2019    Assessment & Plan    1.  SVT/palpitations: -Previous 30-day event monitor in 05/2020 with no significant arrhythmias or extra beats. -EKG performed in office today with occasional PVCs however Apple Watch does reveal frequent PVCs. -We will increase her carvedilol to 12.5 mg twice daily -We will check BMET, Mg, BNP today -Encouraged importance to avoid dietary stimulants such as caffeine, chocolate, or energy drinks.  2.  Lower extremity edema: -Patient is euvolemic on examination -She is compliant with Lasix daily but does endorse some dry mouth and we will have her take Lasix every other day if dry mouth occurs or weight increases by 2 pounds overnight or shortness of breath increases  3.  Shortness of breath: -Patient reports today shortness of breath has resolved and patient is  euvolemic on examination as noted above  4.  Coronary artery disease: -CAD s/p acute MI 2009 -Patient reports today no angina -Continue GDMT with ASA 81 mg daily, carvedilol 12.5 mg twice daily,and Repatha 140 mg q14 days  5.  Hypertension: -Patient's blood pressure today was slightly elevated at 146/68 -She she states that blood pressures are in the 120s at home. -Have her check blood pressures for next 2 weeks and report back to office with increasing carvedilol as noted above  6.  Mitral stenosis: -2D echo completed that revealed peak RV-RA gradient of 42 mmHg, degenerative mitral valve with regurgitation and mild to moderate mitral stenosis with mean gradient of 6 mmHg -Patient was euvolemic on examination today with no shortness of breath noted -Blood pressure today was slightly elevated and patient will increase carvedilol as noted above  Disposition: Follow-up with Dr. Gwyndolyn Kaufman as scheduled   Medication Adjustments/Labs and Tests Ordered: Current medicines are reviewed at length with the patient today.  Concerns regarding medicines are outlined above.   Signed, Mable Fill, Marissa Nestle, NP  11/19/2021, 8:50 AM [place]

## 2021-11-18 NOTE — Telephone Encounter (Signed)
Unrein, Dmiya A "Lorenza Chick" - 11/18/2021  3:50 PM Werner Lean, MD  Sent: Mon November 18, 2021  4:47 PM  To: Nuala Alpha, LPN; Barbarann Ehlers Junius Creamer., NP          Message  Sounds like she has some SOB complaints that did not improve with the small diuretisc and PVCS in the setting of mild MS; she may need higher diuretic dose, and should get an EKG at the visit.    Thanks,  MAC    Will also send this message to Ambrose Pancoast NP as an Juluis Rainier, since he will be seeing the pt for complaints tomorrow 8/22 at 0800.

## 2021-11-19 ENCOUNTER — Encounter: Payer: Self-pay | Admitting: Vascular Surgery

## 2021-11-19 ENCOUNTER — Ambulatory Visit (HOSPITAL_COMMUNITY)
Admission: RE | Admit: 2021-11-19 | Discharge: 2021-11-19 | Disposition: A | Discharge status: Home / Self Care | Payer: Medicare Other | Source: Ambulatory Visit | Attending: Vascular Surgery | Admitting: Vascular Surgery

## 2021-11-19 ENCOUNTER — Encounter: Payer: Self-pay | Admitting: Nurse Practitioner

## 2021-11-19 ENCOUNTER — Ambulatory Visit: Payer: Medicare Other | Admitting: Nurse Practitioner

## 2021-11-19 ENCOUNTER — Ambulatory Visit: Payer: Medicare Other | Admitting: Vascular Surgery

## 2021-11-19 VITALS — BP 146/68 | HR 84 | Ht 63.5 in | Wt 136.2 lb

## 2021-11-19 DIAGNOSIS — R0602 Shortness of breath: Secondary | ICD-10-CM | POA: Diagnosis not present

## 2021-11-19 DIAGNOSIS — I10 Essential (primary) hypertension: Secondary | ICD-10-CM | POA: Diagnosis not present

## 2021-11-19 DIAGNOSIS — I471 Supraventricular tachycardia: Secondary | ICD-10-CM

## 2021-11-19 DIAGNOSIS — R600 Localized edema: Secondary | ICD-10-CM

## 2021-11-19 DIAGNOSIS — I050 Rheumatic mitral stenosis: Secondary | ICD-10-CM

## 2021-11-19 DIAGNOSIS — I6523 Occlusion and stenosis of bilateral carotid arteries: Secondary | ICD-10-CM | POA: Diagnosis present

## 2021-11-19 DIAGNOSIS — I6529 Occlusion and stenosis of unspecified carotid artery: Secondary | ICD-10-CM | POA: Insufficient documentation

## 2021-11-19 MED ORDER — CARVEDILOL 12.5 MG PO TABS: 12.5000 mg | ORAL_TABLET | Freq: Two times a day (BID) | ORAL | 3 refills | Status: DC

## 2021-11-19 NOTE — Progress Notes (Signed)
Patient name: Cheryl Hernandez MRN: [id number] DOB: 1941-02-25 Sex: female  REASON FOR CONSULT: Surveillance of carotid artery disease  HPI: Cheryl Hernandez is a 81 y.o. female, with multiple medical problems including CAD s/p PCI, carotid artery disease, HTN, HLD, aortic stenosis s/p TAVR, DM that presents for 1 year follow-up for surveillance of her carotid artery disease.  She was previously followed by Dr. Oneida Alar who retired from our practice.  She had a history of stroke around her TAVR aortic valve replacement with MRI evidence of bilateral posterior and anterior circulation infarcts about 2 years ago.  She was last seen 10/18/2020 by Dr. Oneida Alar and had had some left eye vision loss in the corner.  When she last saw Dr. Oneida Alar he recommended continued surveillance with 1 year follow-up.  She had about a 40% right ICA and 50% left ICA stenosis.  She reports no additional events since last evaluation.  Past Medical History:  Diagnosis Date   Arthritis    Asthma    CAD (coronary artery disease)    a. NSTEMI 2009 s/p DES to RCAx2.   Carotid artery disease ([place])    a. Mild-moderate - followed by [facility].   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 Burt Knack and Dr. Roxy Manns    Severe aortic stenosis     Past Surgical History:  Procedure Laterality Date   ANGIOPLASTY     CORONARY ANGIOPLASTY WITH STENT PLACEMENT     RIGHT/LEFT HEART CATH AND  CORONARY ANGIOGRAPHY N/A 08/08/2019   Procedure: RIGHT/LEFT HEART CATH AND CORONARY ANGIOGRAPHY;  Surgeon: Belva Crome, MD;  Location: [place] [facility];  Service: Cardiovascular;  Laterality: N/A;   TEE WITHOUT CARDIOVERSION N/A 12/20/2019   Procedure: TRANSESOPHAGEAL ECHOCARDIOGRAM (TEE);  Surgeon: Sherren Mocha, MD;  Location: [place] [facility];  Service: Open Heart Surgery;  Laterality: N/A;   TONSILLECTOMY     TOTAL ABDOMINAL HYSTERECTOMY     TOTAL HIP ARTHROPLASTY     TRANSCATHETER AORTIC VALVE REPLACEMENT, TRANSFEMORAL N/A 12/20/2019   Procedure: TRANSCATHETER AORTIC VALVE REPLACEMENT, TRANSFEMORAL;  Surgeon: Sherren Mocha, MD;  Location: [place] [facility];  Service: Open Heart Surgery;  Laterality: N/A;    Family History  Problem Relation Age of Onset   Allergies Mother    Hyperlipidemia Mother    Prostate cancer Maternal Grandfather    Heart attack Father    Lung disease Neg Hx     SOCIAL HISTORY: Social History   Socioeconomic History   Marital status: Widowed    Spouse name: Not on file   Number of children: Not on file   Years of education: Not on file   Highest education level: Not on file  Occupational History   Occupation: retired Therapist, sports  Tobacco Use   Smoking status: Former  Packs/day: 1.00    Years: 30.00    Total pack years: 30.00    Types: Cigarettes    Quit date: 03/31/1998    Years since quitting: 23.6   Smokeless tobacco: Never   Tobacco comments:    Quit intermittently since her 45s.  Vaping Use   Vaping Use: Never used  Substance and Sexual Activity   Alcohol use: Yes    Alcohol/week: 0.0 standard drinks of alcohol    Comment: Rare.   Drug use: No   Sexual activity: Not on file  Other Topics Concern   Not on file  Social History Narrative   Originally from [place]. She has traveled to [place], [place], [place], & [place]. Previously worked as a Marine scientist. No known TB exposure. No pets currently. No bird, hot tube, or asbestos exposure. Enjoys playing bridge.     Social Determinants of Health   Financial Resource Strain: Not on file  Food Insecurity: Not on file  Transportation Needs: Not on file  Physical Activity: Not on file  Stress: Not on file  Social Connections: Not on file  Intimate Partner Violence: Not on file    Allergies  Allergen Reactions   Amlodipine Swelling   Cefuroxime Axetil Hives and Itching   Cephalexin Other (See Comments)    Total body pain in joints, couldn't swallow    Codeine     Tolerates Dilaudid.   Doxycycline     REACTION: vomiting/ GI upset   Erythromycin Base Other (See Comments)    unknown   Linaclotide     Other reaction(s): Diarrhea   Statins     Myalgia with crestor   Telmisartan     Other reaction(s): Unknown    Current Outpatient Medications  Medication Sig Dispense Refill   acetaminophen (TYLENOL) 500 MG tablet Take 500 mg by mouth every 6 (six) hours as needed (for arthritis pain.).     ACETYLCARNITINE HCL PO Take by mouth daily.     albuterol (VENTOLIN HFA) 108 (90 Base) MCG/ACT inhaler Inhale 2 puffs into the lungs every 6 (six) hours as needed for wheezing or shortness of breath. 18 g 3   Alpha-D-Galactosidase (BEANO PO) Take 1 tablet by mouth daily as needed (flatulence/abdominal bloating.).     ALPRAZolam (XANAX) 0.25 MG tablet Take 0.5 tablets by mouth as needed.     aspirin EC 81 MG tablet Take 81 mg by mouth every evening.     B Complex-C (B-COMPLEX WITH VITAMIN C) tablet Take 1 tablet by mouth daily.      budesonide (ENTOCORT EC) 3 MG 24 hr capsule Take 2 capsules (6 mg total) by mouth 2 (two) times daily as needed (diarrhea). 360 capsule 2   budesonide-formoterol (SYMBICORT) 80-4.5 MCG/ACT inhaler Inhale 2 puffs into the lungs 2 (two) times daily as needed (congestion). 10.2 g 11   calcium carbonate (TUMS - DOSED IN MG ELEMENTAL CALCIUM) 500 MG chewable tablet Chew 1 tablet by mouth 3 (three) times daily as needed for heartburn.      carvedilol (COREG) 12.5 MG tablet Take 1  tablet (12.5 mg total) by mouth 2 (two) times daily. 180 tablet 3   Cholecalciferol (VITAMIN D3) 75 MCG (3000 UT) TABS Take 3,000 Units by mouth every other day.     clopidogrel (PLAVIX) 75 MG tablet Take 1 tablet (75 mg total) by mouth daily. 90 tablet 3   Coenzyme Q10 200 MG capsule Take 200 mg by mouth daily.     docusate sodium (COLACE) 100 MG  capsule daily.     Evolocumab (REPATHA SURECLICK) 322 MG/ML SOAJ INJECT 1 ML INTO THE SKIN EVERY 14 DAYS 6 mL 3   fexofenadine (ALLEGRA) 180 MG tablet Take 180 mg by mouth daily.     furosemide (LASIX) 20 MG tablet Take 1 tablet (20 mg total) by mouth daily. 90 tablet 1   Glucose Blood (FREESTYLE LITE TEST VI) Once daily     guaifenesin (HUMIBID E) 400 MG TABS tablet Take 400 mg by mouth daily.     KRILL OIL PO Take 1 capsule by mouth daily.     Magnesium Citrate 125 MG CAPS Take 125 mg by mouth daily.     metFORMIN (GLUCOPHAGE-XR) 500 MG 24 hr tablet Take 500 mg by mouth daily.     Misc Natural Products (LEG VEIN & CIRCULATION PO) Take by mouth daily.     Multiple Vitamins-Minerals (MULTIVITAMIN WITH MINERALS) tablet Take 1 tablet by mouth every other day. In the morning     nitroGLYCERIN (NITROSTAT) 0.4 MG SL tablet Place 1 tablet (0.4 mg total) under the tongue every 5 (five) minutes as needed for chest pain. 25 tablet 3   Probiotic Product (ALIGN) 4 MG CAPS Take 4 mg by mouth every other day.     Turmeric 400 MG CAPS Take by mouth daily.     zolpidem (AMBIEN) 5 MG tablet Take 5 mg by mouth at bedtime as needed for sleep. (Patient not taking: Reported on 11/19/2021)     No current facility-administered medications for this visit.    REVIEW OF SYSTEMS:  '[X]'$  denotes positive finding, '[ ]'$  denotes negative finding Cardiac  Comments:  Chest pain or chest pressure:    Shortness of breath upon exertion:    Short of breath when lying flat:    Irregular heart rhythm:        Vascular    Pain in calf, thigh, or hip brought on by ambulation:    Pain  in feet at night that wakes you up from your sleep:     Blood clot in your veins:    Leg swelling:         Pulmonary    Oxygen at home:    Productive cough:     Wheezing:         Neurologic    Sudden weakness in arms or legs:     Sudden numbness in arms or legs:     Sudden onset of difficulty speaking or slurred speech:    Temporary loss of vision in one eye:     Problems with dizziness:         Gastrointestinal    Blood in stool:     Vomited blood:         Genitourinary    Burning when urinating:     Blood in urine:        Psychiatric    Major depression:         Hematologic    Bleeding problems:    Problems with blood clotting too easily:        Skin    Rashes or ulcers:        Constitutional    Fever or chills:      PHYSICAL EXAM: Vitals:   11/19/21 1544 11/19/21 1549  BP: (!) 118/56 (!) 124/56  Pulse: 77 79  Resp: 14   Temp: (!) 97.5 F (36.4 C)   TempSrc: Temporal   SpO2: 94%   Weight: 133 lb (60.3  kg)   Height: '5\' 3"'$  (1.6 m)     GENERAL: The patient is a well-nourished female, in no acute distress. The vital signs are documented above. CARDIAC: There is a regular rate and rhythm.  VASCULAR:  Previous neck incisions PULMONARY: No respiratory distress. ABDOMEN: Soft and non-tender. MUSCULOSKELETAL: There are no major deformities or cyanosis. NEUROLOGIC: No focal weakness or paresthesias are detected. SKIN: There are no ulcers or rashes noted. PSYCHIATRIC: The patient has a normal affect.  DATA:   Carotid duplex today shows minimal 1 to 39% stenosis bilaterally  Assessment/Plan:  81 year old female presents for 1 year follow-up for surveillance of her carotid artery disease.  She had bilateral strokes several years ago at the time of TAVR aortic valve replacement in 2021 felt to be cardioembolic.  She was seen last seen in our practice last year by Dr. Oneida Alar and had some left eye vision loss but her carotid stenosis was barely 50% and Dr.  Oneida Alar did not want a pursue further work-up unless she had a second event.  She has had no additional neurologic events over the last year.  Carotid study today shows 1 to 39% stenosis bilaterally.  We will see her again in 1 year.  Discussed in the setting of asymptomatic carotid disease there is no role for intervention unless greater than 80% stenosis.   Marty Heck, MD [facility] Office: [phone]

## 2021-11-19 NOTE — Patient Instructions (Addendum)
Medication Instructions:  Increase Carvedilol 12.5 mg twice day   *If you need a refill on your cardiac medications before your next appointment, please call your pharmacy*   Lab Work: Mag, Bmp, Pro Bnp -  today   If you have labs (blood work) drawn today and your tests are completely normal, you will receive your results only by: [place] (if you have MyChart) OR A paper copy in the mail If you have any lab test that is abnormal or we need to change your treatment, we will call you to review the results.   Testing/Procedures: None ordered    Follow-Up: Follow up as scheduled 1}    Other Instructions Please check your blood pressure everyday for 2 weeks and record your readings.

## 2021-11-20 LAB — BASIC METABOLIC PANEL
BUN/Creatinine Ratio: 20 (ref 12–28)
BUN: 17 mg/dL (ref 8–27)
CO2: 26 mmol/L (ref 20–29)
Calcium: 10.6 mg/dL — ABNORMAL HIGH (ref 8.7–10.3)
Chloride: 98 mmol/L (ref 96–106)
Creatinine, Ser: 0.86 mg/dL (ref 0.57–1.00)
Glucose: 103 mg/dL — ABNORMAL HIGH (ref 70–99)
Potassium: 4.6 mmol/L (ref 3.5–5.2)
Sodium: 138 mmol/L (ref 134–144)
eGFR: 67 mL/min/{1.73_m2} (ref >59)

## 2021-11-20 LAB — PRO B NATRIURETIC PEPTIDE: NT-Pro BNP: 250 pg/mL (ref 0–738)

## 2021-11-20 LAB — MAGNESIUM: Magnesium: 2.2 mg/dL (ref 1.6–2.3)

## 2021-11-27 ENCOUNTER — Telehealth: Payer: Self-pay | Admitting: Pulmonary Disease

## 2021-11-27 NOTE — Telephone Encounter (Signed)
Called and advised the patient that generic is not covered and it would be $300 with a discount card and the name brand is $91.06.  She verbalized understanding.  Nothing further needed.  Called and spoke with the pharmacy, I was advised that her insurance does not cover the generic and with the discount card it would be $300, the name brand is $91.06.  He stated that she must be in the donut hole.  Called and spoke with patient, advised we can contact the pharmacy and change to the generic.  She verbalized understanding.

## 2021-11-27 NOTE — Telephone Encounter (Signed)
Patient called to ask if Dr. Elsworth Soho could change her prescription to the [facility] generic Symbicort because it is much cheaper for her to purchase.  She stated her strength for that med. Is 80-4.5.  Please advise and call patient to confirm.  [phone]

## 2021-11-29 ENCOUNTER — Other Ambulatory Visit: Payer: Medicare Other

## 2021-12-09 NOTE — Progress Notes (Signed)
Office Visit    Patient Name: Cheryl Hernandez Date of Encounter: 12/09/2021  Primary Care Provider:  [name], MD Primary Cardiologist:  Sinclair Grooms, MD Primary Electrophysiologist: None  Chief Complaint    Cheryl Hernandez is a 81 y.o. 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  Past Medical History    Past Medical History:  Diagnosis Date   Arthritis    Asthma    CAD (coronary artery disease)    a. NSTEMI 2009 s/p DES to RCAx2.   Carotid artery disease ([place])    a. Mild-moderate - followed by [facility].   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 Burt Knack and Dr. Roxy Manns    Severe aortic stenosis    Past Surgical History:  Procedure Laterality Date   ANGIOPLASTY     CORONARY ANGIOPLASTY WITH STENT PLACEMENT     RIGHT/LEFT HEART CATH AND CORONARY ANGIOGRAPHY N/A 08/08/2019   Procedure: RIGHT/LEFT HEART CATH AND CORONARY ANGIOGRAPHY;  Surgeon: Belva Crome, MD;  Location: [facility];  Service: Cardiovascular;  Laterality: N/A;   TEE WITHOUT CARDIOVERSION N/A 12/20/2019   Procedure: TRANSESOPHAGEAL ECHOCARDIOGRAM (TEE);  Surgeon: Sherren Mocha, MD;  Location: [facility];  Service: Open Heart Surgery;  Laterality: N/A;   TONSILLECTOMY     TOTAL ABDOMINAL  HYSTERECTOMY     TOTAL HIP ARTHROPLASTY     TRANSCATHETER AORTIC VALVE REPLACEMENT, TRANSFEMORAL N/A 12/20/2019   Procedure: TRANSCATHETER AORTIC VALVE REPLACEMENT, TRANSFEMORAL;  Surgeon: Sherren Mocha, MD;  Location: [facility];  Service: Open Heart Surgery;  Laterality: N/A;    Allergies  Allergies  Allergen Reactions   Amlodipine Swelling   Cefuroxime Axetil Hives and Itching   Cephalexin Other (See Comments)    Total body pain in joints, couldn't swallow    Codeine     Tolerates Dilaudid.   Doxycycline     REACTION: vomiting/ GI upset   Erythromycin Base Other (See Comments)    unknown   Linaclotide     Other reaction(s): Diarrhea   Statins     Myalgia with crestor   Telmisartan     Other reaction(s): Unknown    History of Present Illness    Cheryl Hernandez is a 81 year old female with the above-mentioned past medical history who presents today for complaint of PVCs.  Cheryl Hernandez has a significant cardiac history with NSTEMI in 2009 that was treated with PCI to RCA.  She had diagnostic cath performed 07/2019 that revealed widely patent coronary arteries and stents.  Catheterization also revealed presence of aortic stenosis with peak to peak and mean transvalvular gradient of  33 and 29 mmHg.  2D echo was performed 09/2019 with EF of 60-65%, with severe or aortic valve stenosis.  She was referred to the structural heart team and underwent full work-up for TAVR.  She completed a Lexiscan stress test for complaint of shortness of breath and chest pressure.  The test revealed normal resting and stress perfusion and EF of 73% with low risk study.  She had carotid Dopplers performed bilaterally in 11/2016 that revealed left internal carotid velocities of 40-59% stenosis and 1-39% on the right.  In 11/2019 she had TAVR completed that was successful but patient developed multifocal acute infarcts via MRI of the brain.  She underwent full stroke work-up and was discharged on ASA and  Plavix.    Patient had a 19 beat run of SVT with a rate of 150-175 bpm during cardiac rehab.  She converted to sinus rhythm with rate of 90 spontaneously.  Patient was asymptomatic during event and followed up with Dr. Johney Frame in 05/2020.  She wore 30-day monitor with no sustained arrhythmias and carvedilol was increased to 6.25 mg twice daily.  She presented for clinic visit on 08/30/2020 with complaints of floaters and and sensation of shade coming over her left eye.  These episodes occurred while off of Plavix and patient was placed back on DAPT.  She was considered for loop recorder by Dr. Lovena Le but patient canceled due to possible increased cost.  She currently wears Apple Watch that can atrial fibrillation.  She was seen in follow-up and was euvolemic and endorsed some fatigue with PVCs while working in her garden.  Her carvedilol was increased to 12.5 mg twice daily and patient was advised to avoid triggers such as caffeine, chocolate.  Cheryl Hernandez presents today alone for 1 month follow-up. Since last being seen in the office patient reports she has been doing much better with her blood pressures however she is still experiencing frequent bigeminy and chest tightness.  She has been compliant with her current medications and denies any medication side effects.  She is euvolemic on examination denies any orthopnea dyspnea..  Patient denies chest pain, palpitations,  PND, nausea, vomiting, dizziness, syncope, edema, weight gain, or early satiety.  Home Medications    Current Outpatient Medications  Medication Sig Dispense Refill   acetaminophen (TYLENOL) 500 MG tablet Take 500 mg by mouth every 6 (six) hours as needed (for arthritis pain.).     ACETYLCARNITINE HCL PO Take by mouth daily.     albuterol (VENTOLIN HFA) 108 (90 Base) MCG/ACT inhaler Inhale 2 puffs into the lungs every 6 (six) hours as needed for wheezing or shortness of breath. 18 g 3   Alpha-D-Galactosidase (BEANO PO) Take 1 tablet by  mouth daily as needed (flatulence/abdominal bloating.).     ALPRAZolam (XANAX) 0.25 MG tablet Take 0.5 tablets by mouth as needed.     aspirin EC 81 MG tablet Take 81 mg by mouth every evening.     B Complex-C (B-COMPLEX WITH VITAMIN C) tablet Take 1 tablet by mouth daily.      budesonide (ENTOCORT EC) 3 MG 24 hr capsule Take 2 capsules (6 mg total) by mouth 2 (two) times daily as needed (diarrhea). 360 capsule 2   budesonide-formoterol (SYMBICORT) 80-4.5 MCG/ACT inhaler Inhale 2 puffs into the lungs 2 (two) times daily as needed (congestion). 10.2 g 11   calcium carbonate (TUMS - DOSED IN MG ELEMENTAL CALCIUM) 500 MG chewable tablet Chew 1 tablet by mouth 3 (three) times daily as  needed for heartburn.      carvedilol (COREG) 12.5 MG tablet Take 1 tablet (12.5 mg total) by mouth 2 (two) times daily. 180 tablet 3   Cholecalciferol (VITAMIN D3) 75 MCG (3000 UT) TABS Take 3,000 Units by mouth every other day.     clopidogrel (PLAVIX) 75 MG tablet Take 1 tablet (75 mg total) by mouth daily. 90 tablet 3   Coenzyme Q10 200 MG capsule Take 200 mg by mouth daily.     docusate sodium (COLACE) 100 MG capsule daily.     Evolocumab (REPATHA SURECLICK) 300 MG/ML SOAJ INJECT 1 ML INTO THE SKIN EVERY 14 DAYS 6 mL 3   fexofenadine (ALLEGRA) 180 MG tablet Take 180 mg by mouth daily.     furosemide (LASIX) 20 MG tablet Take 1 tablet (20 mg total) by mouth daily. 90 tablet 1   Glucose Blood (FREESTYLE LITE TEST VI) Once daily     guaifenesin (HUMIBID E) 400 MG TABS tablet Take 400 mg by mouth daily.     KRILL OIL PO Take 1 capsule by mouth daily.     Magnesium Citrate 125 MG CAPS Take 125 mg by mouth daily.     metFORMIN (GLUCOPHAGE-XR) 500 MG 24 hr tablet Take 500 mg by mouth daily.     Misc Natural Products (LEG VEIN & CIRCULATION PO) Take by mouth daily.     Multiple Vitamins-Minerals (MULTIVITAMIN WITH MINERALS) tablet Take 1 tablet by mouth every other day. In the morning     nitroGLYCERIN (NITROSTAT) 0.4 MG  SL tablet Place 1 tablet (0.4 mg total) under the tongue every 5 (five) minutes as needed for chest pain. 25 tablet 3   Probiotic Product (ALIGN) 4 MG CAPS Take 4 mg by mouth every other day.     Turmeric 400 MG CAPS Take by mouth daily.     zolpidem (AMBIEN) 5 MG tablet Take 5 mg by mouth at bedtime as needed for sleep. (Patient not taking: Reported on 11/19/2021)     No current facility-administered medications for this visit.     Review of Systems  Please see the history of present illness.    (+) Frequent palpitations (+) Shortness of breath  All other systems reviewed and are otherwise negative except as noted above.  Physical Exam    Wt Readings from Last 3 Encounters:  11/19/21 133 lb (60.3 kg)  11/19/21 136 lb 3.2 oz (61.8 kg)  10/28/21 136 lb (61.7 kg)   PQ:ZRAQT were no vitals filed for this visit.,There is no height or weight on file to calculate BMI.  Constitutional:      Appearance: Healthy appearance. Not in distress.  Neck:     Vascular: JVD normal.  Pulmonary:     Effort: Pulmonary effort is normal.     Breath sounds: No wheezing. No rales. Diminished in the bases Cardiovascular:     Normal rate. Regular rhythm. Normal S1. Normal S2.      Murmurs: Systolic and diastolic murmur present 2/6 Edema:    Peripheral edema absent.  Abdominal:     Palpations: Abdomen is soft non tender. There is no hepatomegaly.  Skin:    General: Skin is warm and dry.  Neurological:     General: No focal deficit present.     Mental Status: Alert and oriented to person, place and time.     Cranial Nerves: Cranial nerves are intact.  EKG/LABS/Other Studies Reviewed    ECG personally reviewed by me today -none completed today  Lab Results  Component Value Date   CREATININE 0.86 11/19/2021   BUN 17 11/19/2021   NA 138 11/19/2021   K 4.6 11/19/2021   CL 98 11/19/2021   CO2 26 11/19/2021   Lab Results  Component Value Date   ALT 17 12/16/2019   AST 21 12/16/2019    ALKPHOS 67 12/16/2019   BILITOT 0.6 12/16/2019   Lab Results  Component Value Date   CHOL 157 12/22/2019   HDL 67 12/22/2019   LDLCALC 60 12/22/2019   TRIG 151 (H) 12/22/2019   CHOLHDL 2.3 12/22/2019    Lab Results  Component Value Date   HGBA1C 6.0 (H) 12/16/2019    Assessment & Plan    1.  History of SVT: -Previous 30-day event monitor in 05/2020 with no significant arrhythmias or extra beats. -Encouraged importance to avoid dietary stimulants such as caffeine, chocolate, or energy drinks. -Patient continues to endorse bigeminy and shortness of breath -We will discontinue carvedilol 12.5 mg twice daily and start Toprol-XL 37.5 mg daily  2.  Lower extremity edema: -No lower extremity edema present on examination  3.  Coronary artery disease -CAD s/p acute MI 2009 -Patient reports today no angina -Continue GDMT with ASA 81 mg daily, carvedilol 12.5 mg twice daily,and Repatha 140 mg q14 days  4.  Hypertension -Patient's blood pressure today was well controlled at 118/84  5.  Mitral stenosis -2D echo completed that revealed peak RV-RA gradient of 42 mmHg, degenerative mitral valve with regurgitation and mild to moderate mitral stenosis with mean gradient of 6 mmHg -Patient was euvolemic on examination today with no shortness of breath noted     Disposition: Follow-up with [name], MD or APP in 1 months    Medication Adjustments/Labs and Tests Ordered: Current medicines are reviewed at length with the patient today.  Concerns regarding medicines are outlined above.   Signed, Mable Fill, Marissa Nestle, NP 12/09/2021, 4:25 PM [place]

## 2021-12-12 ENCOUNTER — Ambulatory Visit: Payer: Medicare Other | Attending: Nurse Practitioner | Admitting: Nurse Practitioner

## 2021-12-12 ENCOUNTER — Encounter: Payer: Self-pay | Admitting: Nurse Practitioner

## 2021-12-12 VITALS — BP 118/84 | HR 52 | Ht 63.0 in | Wt 135.0 lb

## 2021-12-12 DIAGNOSIS — I25118 Atherosclerotic heart disease of native coronary artery with other forms of angina pectoris: Secondary | ICD-10-CM

## 2021-12-12 DIAGNOSIS — I050 Rheumatic mitral stenosis: Secondary | ICD-10-CM | POA: Diagnosis not present

## 2021-12-12 DIAGNOSIS — Z952 Presence of prosthetic heart valve: Secondary | ICD-10-CM | POA: Diagnosis not present

## 2021-12-12 DIAGNOSIS — R600 Localized edema: Secondary | ICD-10-CM

## 2021-12-12 DIAGNOSIS — I471 Supraventricular tachycardia: Secondary | ICD-10-CM | POA: Diagnosis not present

## 2021-12-12 MED ORDER — METOPROLOL SUCCINATE ER 25 MG PO TB24: 37.5000 mg | ORAL_TABLET | Freq: Every day | ORAL | 3 refills | Status: DC

## 2021-12-12 NOTE — Patient Instructions (Addendum)
Medication Instructions:  Your physician has recommended you make the following change in your medication:   Stop taking Coreg/Carvedilol  Start taking Toprol XL/metoprolol succinate 37.'5mg'$  (1 and 1/2 tablets) daily at bedtime *If you need a refill on your cardiac medications before your next appointment, please call your pharmacy*   Follow-Up: At [facility], you and your health needs are our priority.  As part of our continuing mission to provide you with exceptional heart care, we have created designated Provider Care Teams.  These Care Teams include your primary Cardiologist (physician) and Advanced Practice Providers (APPs -  Physician Assistants and Nurse Practitioners) who all work together to provide you with the care you need, when you need it.  We recommend signing up for the patient portal called "MyChart".  Sign up information is provided on this After Visit Summary.  MyChart is used to connect with patients for Virtual Visits (Telemedicine).  Patients are able to view lab/test results, encounter notes, upcoming appointments, etc.  Non-urgent messages can be sent to your provider as well.   To learn more about what you can do with MyChart, go to [email or web address].    Your next appointment:   1 month(s)  The format for your next appointment:   In Person  Provider:   Ambrose Pancoast, NP

## 2022-01-24 NOTE — Progress Notes (Unsigned)
Cardiology Office Note:    Date:  01/24/2022   ID:  Cheryl Hernandez, DOB 02/24/1941, MRN [id number]  PCP:  [name], MD   [place]  Cardiologist:  Sinclair Grooms, MD  Advanced Practice Provider:  No care team member to display Electrophysiologist:  None   Referring MD: [name], MD    History of Present Illness:    Cheryl Hernandez is a 81 y.o. 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   Patient has a history of NSTEMI s/p PCI to RCA in 2009. She was followed by Dr. [name] and noted  a 2-3 year hx of progressive exertional shortness of breath and fatigue. TTEs revealed presence of aortic stenosis that has gradually increased in severity. Diagnostic cath  was performed by Dr. 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 Catheterization also confirmed the presence of aortic stenosis with peak to peak and mean transvalvular gradients measured 33 and 29 mmHg respectively, corresponding to aortic valve area calculated only 0.81 cm.  Right-sided pressures were normal. Follow-up TTE 10/07/19 revealed paradoxical severe low-flow low gradient aortic stenosis.   She was evaluated by the multidisciplinary valve team and underwent successful TAVR with a 26 mm Medtronic Evolut Pro + THV via the TF approach on 12/20/19. Post operative TTE showed EF 70%, normally functioning TAVR with a mean gradient of 10.5 mmHg and no PVL. Mild-mod MR. Unfortunately, patient developed a persistent headache and blurry vision s/p TAVR. 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 She was seen by neurology and underwent full  stroke work up. She was discharged on aspirin and plavix. Also had a Zio patch which showed no atrial fibrillation. In follow up she complained of persistent visual issues and hallucinations. She was referred to Dr. Chauncy Passy with neuro psych.   On 05/02/20, patient had 19 beat run of SVT with HR 150-175 during cardiac rehab. Converted back to SR afterwards with HR 90s. Seen on 05/14/20 where she was doing overall well. She was having some daytime fatigue which she thought may have been related to her bystolic. We changed her to coreg. During our last visit on 06/28/20, we increased her coreg due to palpitations and rapid HR which were bothersome to her.  She was seen back in clinic on 08/30/20 for follow up and complained of white floaters and 2 episodes of the sensation of a "shade coming over her left eye" worrisome for amaurosis fugax. Of note these episodes occurred off plavix. She was put back on DAPT with aspirin and plavix and referred to Dr. Lovena Le for consideration of a loop and seen on 10/04/20. Plan was to repeat carotid dopplers prior to considering loop. This showed 1-39% RICA and 14-43% LICA stenosis. She was seen by Dr. Oneida Alar who offered her arch aortogram carotid angiogram for further evaluation as this would be considering looking for ulcerated plaque.  Other potential imaging would be a CT angiogram but this would be less diagnostic for ulcerated plaque. Ultimately, continued medical management was decided with 1 year follow up. She was seen by neuro on 10/19/20 who agreed for evaluating for afib. Loop recorder insertion was planned for 11/21/20  but pt cancelled due to not knowing how much it might cost. She is now wearing an apple watch that monitor for afib.  Was seen in clinic on 05/2021 where she was doing well from a CV standpoint. Palpitations generally controlled. Tolerating the coreg.   Was seen in clinic on 10/28/21 where she was stable from a CV standpoint. Had significant  diastolic murmur on exam. TTE showed EF 65-70%, G2DD, normal RV, moderate LAE, moderate MR, mild to moderate MS with mean gradient 43mHg, normal functioning TAVR with mean gradient 153mg, DI 0.63, mild AI.   Was last seen by ErAmbrose Pancoastn 11/2021. Blood pressures were better controlled but she was having frequent ectopy and chest tightness. Her coreg was changed to metop 37.14m69mID.  Today, ***  Past Medical History:  Diagnosis Date   Arthritis    Asthma    CAD (coronary artery disease)    a. NSTEMI 2009 s/p DES to RCAx2.   Carotid artery disease ([facility]  a. Mild-moderate - followed by [facility].   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 CooBurt Knackd Dr. OweRoxy Manns Severe aortic stenosis     Past Surgical History:  Procedure Laterality Date   ANGIOPLASTY     CORONARY ANGIOPLASTY WITH STENT PLACEMENT     RIGHT/LEFT HEART CATH AND CORONARY ANGIOGRAPHY N/A 08/08/2019   Procedure: RIGHT/LEFT HEART CATH AND CORONARY ANGIOGRAPHY;  Surgeon: SmiBelva CromeD;  Location: [facility];  Service: Cardiovascular;  Laterality: N/A;   TEE WITHOUT CARDIOVERSION N/A 12/20/2019   Procedure: TRANSESOPHAGEAL ECHOCARDIOGRAM (TEE);  Surgeon: CooSherren MochaD;  Location: [facility];  Service: Open Heart Surgery;  Laterality: N/A;   TONSILLECTOMY     TOTAL ABDOMINAL HYSTERECTOMY     TOTAL HIP ARTHROPLASTY     TRANSCATHETER AORTIC VALVE REPLACEMENT,  TRANSFEMORAL N/A 12/20/2019   Procedure: TRANSCATHETER AORTIC VALVE REPLACEMENT, TRANSFEMORAL;  Surgeon: CooSherren MochaD;  Location: [facility];  Service: Open Heart Surgery;  Laterality: N/A;    Current Medications: No outpatient medications have been marked as taking for the 01/28/22 encounter (Appointment) with PemFreada BergeronD.     Allergies:   Amlodipine, Cefuroxime axetil, Cephalexin, Codeine, Doxycycline, Erythromycin base, Linaclotide, Statins, and Telmisartan   Social History   Socioeconomic History   Marital status: Widowed    Spouse name: Not on file   Number of children: Not on file   Years of education: Not on file   Highest education level: Not on file  Occupational History   Occupation: retired RN Therapist, [employer]   Smoking status: Former    Packs/day: 1.00    Years: 30.00    Total pack years: 30.00    Types: Cigarettes    Quit date: 03/31/1998  Years since quitting: 23.8   Smokeless tobacco: Never   Tobacco comments:    Quit intermittently since her 87s.  Vaping Use   Vaping Use: Never used  Substance and Sexual Activity   Alcohol use: Yes    Alcohol/week: 0.0 standard drinks of alcohol    Comment: Rare.   Drug use: No   Sexual activity: Not on file  Other Topics Concern   Not on file  Social History Narrative   Originally from [place]. She has traveled to [place], [place], [place], & [place]. Previously worked as a Marine scientist. No known TB exposure. No pets currently. No bird, hot tube, or asbestos exposure. Enjoys playing bridge.    Social Determinants of Health   Financial Resource Strain: Not on file  Food Insecurity: Not on file  Transportation Needs: Not on file  Physical Activity: Not on file  Stress: Not on file  Social Connections: Not on file     Family History: The patient's family history includes Allergies in her mother; Heart attack in her father; Hyperlipidemia in her mother; Prostate cancer in her maternal grandfather. There is no history  of Lung disease.  ROS:   Please see the history of present illness.    Review of Systems  Constitutional:  Negative for chills, fever and malaise/fatigue.  HENT:  Positive for congestion. Negative for hearing loss.   Eyes:  Negative for redness.  Respiratory:  Positive for shortness of breath.   Cardiovascular:  Positive for chest pain. Negative for palpitations, orthopnea, claudication, leg swelling and PND.  Gastrointestinal:  Negative for melena, nausea and vomiting.  Genitourinary:  Negative for flank pain.  Musculoskeletal:  Negative for joint pain and myalgias.  Neurological:  Negative for dizziness and loss of consciousness.  Endo/Heme/Allergies:  Negative for polydipsia.  Psychiatric/Behavioral:  Negative for substance abuse.     EKGs/Labs/Other Studies Reviewed:    The following studies were reviewed today: TTE 11/23/21: IMPRESSIONS     1. Left ventricular ejection fraction, by estimation, is 65 to 70%. The  left ventricle has hyperdynamic function. The left ventricle has no  regional wall motion abnormalities. Left ventricular diastolic parameters  are consistent with Grade II diastolic  dysfunction (pseudonormalization).   2. Right ventricular systolic function is normal. The right ventricular  size is normal.   3. Left atrial size was moderately dilated.   4. Right atrial size was mildly dilated.   5. The mitral valve is degenerative. Moderate mitral valve regurgitation.  Mild to moderate mitral stenosis, mean gradient 6 mmHg. Severe mitral  annular calcification.   6. Peak RV-RA gradient 42 mmHg. Tricuspid valve regurgitation is mild.   7. IVC not visualized.   TTE 09/02/21: IMPRESSIONS   1. Left ventricular ejection fraction, by estimation, is 60 to 65%. The  left ventricle has normal function. The left ventricle has no regional  wall motion abnormalities. Left ventricular diastolic parameters are  indeterminate. Elevated left ventricular  end-diastolic  pressure.   2. Right ventricular systolic function is normal. The right ventricular  size is normal. There is normal pulmonary artery systolic pressure. The  estimated right ventricular systolic pressure is 87.8 mmHg.   3. The mitral valve is degenerative. Mild mitral valve regurgitation.  Mild mitral stenosis. The mean mitral valve gradient is 6.0 mmHg. Moderate  mitral annular calcification.   4. The aortic valve is normal in structure. Aortic valve regurgitation is  not visualized. No aortic stenosis is present.   5. The inferior vena cava is normal  in size with greater than 50%  respiratory variability, suggesting right atrial pressure of 3 mmHg.   Echo 02/14/2021:  1. Left ventricular ejection fraction, by estimation, is 60 to 65%. The  left ventricle has normal function. The left ventricle has no regional  wall motion abnormalities. Left ventricular diastolic parameters are  consistent with Grade II diastolic  dysfunction (pseudonormalization).   2. Right ventricular systolic function is normal. The right ventricular  size is normal. There is normal pulmonary artery systolic pressure. The  estimated right ventricular systolic pressure is 86.1 mmHg.   3. The mitral valve is degenerative. Moderate mitral valve regurgitation.  The mean mitral valve gradient is 7.0 mmHg but MVA by VTI is 2.2 cm^2,  suspect no more than mild mitral stenosis with elevated gradient due to MR and high flow.   4. Bioprosthetic aortic valve s/p TAVR with 26 mm Medtronic Evolut Pro  THV. Mean gradient 11 mmHg. There was mild peri-valvular leakage.   5. Left atrial size was mildly dilated.   6. The inferior vena cava is normal in size with greater than 50%  respiratory variability, suggesting right atrial pressure of 3 mmHg.  Bilateral Carotid Dopplers 10/18/2020: Summary:  Right Carotid: Velocities in the right ICA are consistent with a 1-39%  stenosis.   Left Carotid: Velocities in the left ICA are  consistent with a 40-59%  stenosis.   Vertebrals:  Bilateral vertebral arteries demonstrate antegrade flow.  Subclavians: Normal flow hemodynamics were seen in bilateral subclavian arteries.   Monitor 05/2020: Cardiac monitoring period was from 05/18/20-06/16/20. Predominant rhythm was NSR with average HR 83bpm; ranging from 55-138bpm There was no atrial fibrillation, SVT or VT detected Rare PVCs, rare SVE Overall, no significant arrhythmias or pauses detected on the monitor.  Echo 01/26/20 IMPRESSIONS   1. 26 mm Evolute Pro. Vmax 1.74 m/s, MG 7.2 mmHG, EOA 2.33 cm2, DI 0.74. Mild paravalvular leak is the 5-6 o'clock position. The aortic valve has been repaired/replaced. Aortic valve regurgitation is mild. There is a 26 mm CoreValve-Evolut Pro prosthetic (TAVR) valve present in the aortic position. Procedure Date: 12/20/2019.   2. Left ventricular ejection fraction, by estimation, is 65 to 70%. The  left ventricle has normal function. The left ventricle has no regional  wall motion abnormalities. Left ventricular diastolic function could not  be evaluated.   3. Right ventricular systolic function is normal. The right ventricular  size is normal. There is normal pulmonary artery systolic pressure. The  estimated right ventricular systolic pressure is 68.3 mmHg.   4. Left atrial size was mildly dilated.   5. Mild calcific mitral stenosis (degnerative). MVA by VTI 2.2 cm2. The  mitral valve is degenerative. Mild mitral valve regurgitation. Mild mitral  stenosis. The mean mitral valve gradient is 6.6 mmHg with average heart  rate of 83 bpm. Moderate to severe  mitral annular calcification.   6. The inferior vena cava is normal in size with greater than 50%  respiratory variability, suggesting right atrial pressure of 3 mmHg.   Comparison(s): Changes from prior study are noted. Mild PVL is now  present.    TAVR OPERATIVE NOTE     Date of Procedure:                12/20/2019    Preoperative Diagnosis:      Severe Aortic Stenosis    Postoperative Diagnosis:    Same    Procedure:        Transcatheter Aortic Valve Replacement -  Percutaneous Right Transfemoral Approach             Medtronic CoreValve Evolut Pro (size 26 mm, serial # [id number])              Co-Surgeons:                        Valentina Gu. Roxy Manns, MD and Sherren Mocha, MD   Anesthesiologist:                  Belinda Block, MD   Echocardiographer:              Sanda Klein, MD   Pre-operative Echo Findings: Severe aortic stenosis Normal left ventricular systolic function   Post-operative Echo Findings: No paravalvular leak Normal left ventricular systolic function   _____________   Cath 08/08/19: Moderate aortic stenosis with peak to peak gradient of 33 mmHg, mean gradient 29 mmHg, and calculated aortic valve area of 0.81 cm. Normal pulmonary pressures with mean arterial pressure of 23 mmHg. 3 mmHg gradient across the mitral valve Normal left ventricular systolic function with EF 65%. Widely patent left main Proximal LAD saccular aneurysm not substantially different in appearance than 2006 and 2012 angiogram comparisons.  Luminal irregularities are noted throughout the LAD territory. Luminal irregularities noted throughout the circumflex. The RCA is dominant, tortuous, and contains a patent mid to distal stent beyond several acute bends.  Diffuse luminal irregularities are noted otherwise. Scattered significant calcification is noted in the coronary arterial tree, the thoracic aorta, and mitral annulus.   RECOMMENDATIONS:   There is discordance between aortic stenosis and coronary disease severity and symptoms.  Dyspnea on exertion may be multifactorial. LAD aneurysm is stable. Consider referral to aortic valve clinic for an opinion. May need pulmonary evaluation. May need transesophageal echo to exclude the possibility of mitral valve disease being more significant than suggested by  current data.     Echo 12/21/19:  IMPRESSIONS   1. A 3.5 m/s intracavitary "gradient" is present due to hyperdynamic left  ventricular contraction. Left ventricular ejection fraction, by  estimation, is 70 to 75%. The left ventricle has hyperdynamic function.  The left ventricle has no regional wall  motion abnormalities. Left ventricular diastolic parameters are consistent  with Grade II diastolic dysfunction (pseudonormalization). Elevated left  atrial pressure.   2. Right ventricular systolic function is normal. The right ventricular  size is normal. There is mildly elevated pulmonary artery systolic  pressure.   3. The mitral valve is normal in structure. Mild to moderate mitral valve  regurgitation. No evidence of mitral stenosis. Moderate mitral annular  calcification.   4. The aortic valve has been repaired/replaced. Aortic valve  regurgitation is not visualized. No aortic stenosis is present. There is a  CoreValve-Evolut Pro prosthetic (TAVR) valve present in the aortic  position. Procedure Date: 12/20/2019.   5. The inferior vena cava is normal in size with greater than 50%  respiratory variability, suggesting right atrial pressure of 3 mmHg.    __________________   ZIo AT 12/22/19 Study Highlights   Basic rhythm is NSR Non Sustained SVT at rates 115 to 214 bpm. Longest 11 seconds No atrial fibrillation SVT burden < 1%   _________________   Coronary CT 11/03/2019: IMPRESSION: 1. Aortic Valve: Tricuspid aortic valve. Severely reduced cusp separation. Severely thickened, moderately calcified aortic valve cusps.   2.  AV calcium score: 839   3.  LVOT calcifications extending to anterior mitral leaflet.  4. Borderline height of the left main coronary artery ostia, 11 mm. Adequate height of the RCA ostium.   5. Based on these findings, the annulus measures within range for a 20 mm Edwards Sapien 3 valve. Sizing is borderline for 23 mm vs 26 mm Medtronic Evolut Pro  supra-annular valve. Recommend Structural Heart Team discussion for valve selection.   6. Optimum Fluoroscopic Angle for Delivery: LAO 1 CAU 1    EKG:    EKG is personally reviewed. 10/28/21: NSR with HR 89  Recent Labs: 11/19/2021: BUN 17; Creatinine, Ser 0.86; Magnesium 2.2; NT-Pro BNP 250; Potassium 4.6; Sodium 138   Recent Lipid Panel    Component Value Date/Time   CHOL 157 12/22/2019 0841   CHOL 150 07/04/2019 0749   TRIG 151 (H) 12/22/2019 0841   HDL 67 12/22/2019 0841   HDL 68 07/04/2019 0749   CHOLHDL 2.3 12/22/2019 0841   VLDL 30 12/22/2019 0841   LDLCALC 60 12/22/2019 0841   LDLCALC 64 07/04/2019 0749     Risk Assessment/Calculations:       Physical Exam:    VS:  There were no vitals taken for this visit.    Wt Readings from Last 3 Encounters:  12/12/21 135 lb (61.2 kg)  11/19/21 133 lb (60.3 kg)  11/19/21 136 lb 3.2 oz (61.8 kg)     GEN:  Well nourished, well developed in no acute distress HEENT: Normal NECK: No JVD; No carotid bruits CARDIAC: RRR, 2/6 early-peaking systolic murmur, new 2-3/3 diastolic murmur RESPIRATORY:  Clear to auscultation without rales, wheezing or rhonchi  ABDOMEN: Soft, non-tender, non-distended MUSCULOSKELETAL:  No edema; No deformity  SKIN: Warm and dry NEUROLOGIC:  Alert and oriented x 3 PSYCHIATRIC:  Normal affect   ASSESSMENT:    No diagnosis found.   PLAN:    In order of problems listed above:  #Severe, paradoxical AS s/p TAVR 12/20/19: TTE 08/2021 with LVEF 60-65% with normal functioning TAVR with mean gradient 24mHg and mild PVL. NYHA class II symptoms. Repeat TTE on 000/7622for diastolic murmur showed stable EF and gradients. Mean AoV gradient 12mg, DI 0.6, mild AR. -Follow-up with structural heart team as scheduled -Continue ASA 8190maily -Continue plavix 42m67mily as above -SBE ppx prescribed  #Concern for Amaurosis Fugax: No recurrence. Patient with 2 episodes in 2022 of the sensation of a "shade  coming over her left eye" that lasted for several hours before resolving. Symptoms highly concerning for amaurosis fugax. Multiple monitors with no evidence of Afib, however, patient has frequent palpitations. TTE 01/2021 with EF 60%, normally functioning TAVR with a mean gradient of 8 mmHg and mild PVL. There is mod MR/mild MS and mild TR. Given concern for amaurosis fugax, we referred for loop but patient declined due to cost. Now monitoring with apple watch.  -Continue apple watch -Continue plavix 42mg90mly  -Continue ASA 81mg 47my -Continue repatha 140mg q73mks  #SVT: #Palpitations: Patient with 19 beat episode of SVT on monitor while at cardiac rehab. Has known history of SVT. Cardiac monitor 05/2021 with no episodes of Afib but some runs of SVT. Now monitoring with apple watch as detailed above. -Continue metop 37.5mg BID84m#Mild-to-moderate MS: #Severe MAC: Mean gradient 6mmHg wi89mdiastolic murmur on exam.  -Continue metop 37.5mg BID -59mial TTEs for monitoring   #Post-Op CVA: Symptoms resolved.  -Continue ASA 81mg daily40m plavix 42mg daily 61mbove   #Carotid Artery Disease: Mild on most recent dopplers on 11/19/21 at 1-39% bilaterally -  Continue ASA 50m daily and plavix 744mdaily as above -On repatha 14030m2weeks   #CAD s/p RCA PCI in the past: No anginal symptoms. -Continue ASA 6m26mily and plavix 75mg2mly as above -Continue metop 37.5mg B72m-Continue losartan 25mg d79m   #HTN: -Losartan 25mg da74m-Continue metop 37.5mg BID 19mild LE edema: -Continue lasix 20mg prn 62meeded   Follow-up:  6 months.  Medication Adjustments/Labs and Tests Ordered: Current medicines are reviewed at length with the patient today.  Concerns regarding medicines are outlined above.   No orders of the defined types were placed in this encounter.  No orders of the defined types were placed in this encounter.  There are no Patient Instructions on file for this visit.    Signed, Melaya Hoselton E Freada Bergeron7/2023 8:44 PM    [facility]

## 2022-01-28 ENCOUNTER — Ambulatory Visit: Payer: Medicare Other | Attending: Cardiology | Admitting: Cardiology

## 2022-01-28 ENCOUNTER — Encounter: Payer: Self-pay | Admitting: Cardiology

## 2022-01-28 VITALS — BP 140/72 | HR 78 | Ht 63.0 in | Wt 133.0 lb

## 2022-01-28 DIAGNOSIS — Z952 Presence of prosthetic heart valve: Secondary | ICD-10-CM | POA: Diagnosis not present

## 2022-01-28 DIAGNOSIS — I350 Nonrheumatic aortic (valve) stenosis: Secondary | ICD-10-CM

## 2022-01-28 DIAGNOSIS — I050 Rheumatic mitral stenosis: Secondary | ICD-10-CM | POA: Diagnosis not present

## 2022-01-28 DIAGNOSIS — I25118 Atherosclerotic heart disease of native coronary artery with other forms of angina pectoris: Secondary | ICD-10-CM

## 2022-01-28 DIAGNOSIS — I471 Supraventricular tachycardia, unspecified: Secondary | ICD-10-CM

## 2022-01-28 DIAGNOSIS — G453 Amaurosis fugax: Secondary | ICD-10-CM

## 2022-01-28 DIAGNOSIS — I10 Essential (primary) hypertension: Secondary | ICD-10-CM

## 2022-01-28 MED ORDER — CARVEDILOL 6.25 MG PO TABS: 6.2500 mg | ORAL_TABLET | Freq: Two times a day (BID) | ORAL | 3 refills | Status: DC

## 2022-01-28 MED ORDER — DILTIAZEM HCL 30 MG PO TABS: 30.0000 mg | ORAL_TABLET | Freq: Two times a day (BID) | ORAL | 1 refills | Status: AC | PRN

## 2022-01-28 NOTE — Progress Notes (Signed)
Cardiology Office Note:    Date:  01/28/2022   ID:  [name], DOB 06/01/1940, MRN [id number]  PCP:  [name], MD   [place]  Cardiologist:  Sinclair Grooms, MD  Advanced Practice Provider:  No care team member to display Electrophysiologist:  None   Referring MD: [name], MD    History of Present Illness:    Cheryl Hernandez is a 81 y.o. 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   Patient has a history of NSTEMI s/p PCI to RCA in 2009. She was followed by Dr. [name] and noted  a 2-3 year hx of progressive exertional shortness of breath and fatigue. TTEs revealed presence of aortic stenosis that has gradually increased in severity. Diagnostic cath  was performed by Dr. 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 Catheterization also confirmed the presence of aortic stenosis with peak to peak and mean transvalvular gradients measured 33 and 29 mmHg respectively, corresponding to aortic valve area calculated only 0.81 cm.  Right-sided pressures were normal. Follow-up TTE 10/07/19 revealed paradoxical severe low-flow low gradient aortic stenosis.   She was evaluated by the multidisciplinary valve team and underwent successful TAVR with a 26 mm Medtronic Evolut Pro + THV via the TF approach on 12/20/19. Post operative TTE showed EF 70%, normally functioning TAVR with a mean gradient of 10.5 mmHg and no PVL. Mild-mod MR. Unfortunately, patient developed a persistent headache and blurry vision s/p TAVR. 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 She was seen by neurology and underwent full  stroke work up. She was discharged on aspirin and plavix. Also had a Zio patch which showed no atrial fibrillation. In follow up she complained of persistent visual issues and hallucinations. She was referred to Dr. Chauncy Passy with neuro psych.   On 05/02/20, patient had 19 beat run of SVT with HR 150-175 during cardiac rehab. Converted back to SR afterwards with HR 90s. Seen on 05/14/20 where she was doing overall well. She was having some daytime fatigue which she thought may have been related to her bystolic. We changed her to coreg. During our last visit on 06/28/20, we increased her coreg due to palpitations and rapid HR which were bothersome to her.  She was seen back in clinic on 08/30/20 for follow up and complained of white floaters and 2 episodes of the sensation of a "shade coming over her left eye" worrisome for amaurosis fugax. Of note these episodes occurred off plavix. She was put back on DAPT with aspirin and plavix and referred to Dr. Lovena Le for consideration of a loop and seen on 10/04/20. Plan was to repeat carotid dopplers prior to considering loop. This showed 1-39% RICA and 54-49% LICA stenosis. She was seen by Dr. Oneida Alar who offered her arch aortogram carotid angiogram for further evaluation as this would be considering looking for ulcerated plaque.  Other potential imaging would be a CT angiogram but this would be less diagnostic for ulcerated plaque. Ultimately, continued medical management was decided with 1 year follow up. She was seen by neuro on 10/19/20 who agreed for evaluating for afib. Loop recorder insertion was planned for 11/21/20  but pt cancelled due to not knowing how much it might cost. She is now wearing an apple watch that monitor for afib.  Was seen in clinic on 05/2021 where she was doing well from a CV standpoint. Palpitations generally controlled. Tolerating the coreg.   Was seen in clinic on 10/28/21 where she was stable from a CV standpoint. Had significant  diastolic murmur on exam. TTE showed EF 65-70%, G2DD, normal RV, moderate LAE, moderate MR, mild to moderate MS with mean gradient 26mHg, normal functioning TAVR with mean gradient 149mg, DI 0.63, mild AI.   Was last seen by ErAmbrose Pancoastn 11/2021. Blood pressures were better controlled but she was having frequent ectopy and chest tightness. Her coreg was changed to metop 37.50m59mID.  Today, the patient states that she has been feeling worse since starting metoprolol. She has been feeling very lethargic and fatigued. She noticed no improvement in severity of palpitations, but believed they happened less often due to the loss of energy and resulting lower activity caused by the metoprolol. She had felt better on coreg. She notices her palpitations often when doing moderate activity around the house or yard; they improve after taking some time to rest. She describes the feeling as "jitters" in the chest. During her episodes she hardly ever goes up to 100 bpm, but gets around 80 to 90. We discussed this at length today. She does not wish to see at EP at this time but is wanting to change back to coreg.   She denies any chest pain or peripheral edema. No lightheadedness, headaches, syncope, orthopnea, or PND.  Past Medical History:  Diagnosis Date   Arthritis    Asthma    CAD (coronary artery disease)    a. NSTEMI 2009 s/p DES to RCAx2.   Carotid artery disease ([facility]  a. Mild-moderate - followed by [facility].   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 CooBurt Knackd Dr. OweRoxy Manns Severe aortic stenosis     Past Surgical History:  Procedure Laterality Date   ANGIOPLASTY     CORONARY ANGIOPLASTY WITH STENT PLACEMENT     RIGHT/LEFT HEART CATH AND CORONARY ANGIOGRAPHY N/A 08/08/2019   Procedure: RIGHT/LEFT HEART CATH AND CORONARY ANGIOGRAPHY;  Surgeon: SmiBelva CromeD;  Location: [facility] [place];  Service: Cardiovascular;  Laterality: N/A;   TEE WITHOUT CARDIOVERSION N/A 12/20/2019   Procedure: TRANSESOPHAGEAL ECHOCARDIOGRAM (TEE);  Surgeon: CooSherren MochaD;  Location: [facility] [place] [facility];  Service: Open Heart Surgery;  Laterality: N/A;   TONSILLECTOMY     TOTAL ABDOMINAL HYSTERECTOMY     TOTAL HIP ARTHROPLASTY     TRANSCATHETER AORTIC VALVE REPLACEMENT, TRANSFEMORAL N/A 12/20/2019   Procedure: TRANSCATHETER AORTIC VALVE REPLACEMENT, TRANSFEMORAL;  Surgeon: CooSherren MochaD;  [facility];  Service: Open Heart Surgery;  Laterality: N/A;    Current Medications: Current Meds  Medication Sig   acetaminophen (TYLENOL) 500 MG tablet Take 500 mg by mouth every 6 (six) hours as needed (for arthritis pain.).   ACETYLCARNITINE HCL PO Take by mouth daily.   albuterol (VENTOLIN HFA) 108 (90 Base) MCG/ACT inhaler Inhale 2 puffs into the lungs every 6 (six) hours as needed for wheezing or shortness of breath.   Alpha-D-Galactosidase (BEANO PO) Take 1 tablet by mouth daily as needed (flatulence/abdominal bloating.).   ALPRAZolam (XANAX) 0.25 MG tablet Take 0.5 tablets by mouth as needed.   aspirin EC 81 MG tablet Take 81 mg by mouth every evening.   B Complex-C (B-COMPLEX WITH VITAMIN C) tablet Take 1 tablet by mouth daily.    budesonide (ENTOCORT EC) 3 MG 24 hr capsule Take 2 capsules (6 mg total) by mouth 2 (two) times daily as needed (diarrhea).   budesonide-formoterol (SYMBICORT) 80-4.5 MCG/ACT inhaler Inhale 2 puffs into the lungs  2 (two) times daily as needed (congestion).   calcium carbonate (TUMS - DOSED IN MG ELEMENTAL CALCIUM) 500 MG chewable tablet Chew 1 tablet by mouth 3 (three) times daily as needed for heartburn.    carvedilol (COREG) 6.25 MG tablet Take 1 tablet (6.25 mg total) by mouth 2 (two) times daily.   Cholecalciferol (VITAMIN D3) 75 MCG (3000 UT) TABS Take 3,000 Units by mouth every other day.   cholestyramine (QUESTRAN) 4 GM/DOSE powder Take by mouth 3 (three) times daily with meals.   clopidogrel (PLAVIX) 75 MG tablet Take 1 tablet (75 mg total) by mouth daily.   Coenzyme Q10 200 MG capsule Take 200 mg by mouth daily.   diltiazem (CARDIZEM) 30 MG tablet Take 1 tablet (30 mg total) by mouth 2 (two) times daily as needed (breakthrough palpitations).   docusate sodium (COLACE) 100 MG capsule daily.   Evolocumab (REPATHA SURECLICK) 081 MG/ML SOAJ INJECT 1 ML INTO THE SKIN EVERY 14 DAYS   fexofenadine (ALLEGRA) 180 MG tablet Take 180 mg by mouth daily.   furosemide (LASIX) 20 MG tablet Take 1 tablet (20 mg total) by mouth daily.   Glucose Blood (FREESTYLE LITE TEST VI) Once daily   guaifenesin (HUMIBID E) 400 MG TABS tablet Take 400 mg by mouth daily.   KRILL OIL PO Take 1 capsule by mouth daily.   Magnesium Citrate 125 MG CAPS Take 125 mg by mouth daily.   metFORMIN (GLUCOPHAGE-XR) 500 MG 24 hr tablet Take 500 mg by mouth daily.   Misc Natural Products (LEG VEIN & CIRCULATION PO) Take by mouth daily.   Multiple Vitamins-Minerals (MULTIVITAMIN WITH MINERALS) tablet Take 1 tablet by mouth every other day. In the morning   nitroGLYCERIN (NITROSTAT) 0.4 MG SL tablet Place 1 tablet (0.4 mg total) under the tongue every 5 (five) minutes as needed for chest pain.   Probiotic Product (ALIGN) 4 MG CAPS Take 4 mg by mouth every other day.   Turmeric 400 MG CAPS Take by mouth daily.   UNABLE TO FIND Med Name: saffron 15   [DISCONTINUED] metoprolol succinate (TOPROL XL) 25 MG 24 hr tablet Take 1.5 tablets (37.5 mg  total) by mouth daily.     Allergies:   Amlodipine, Cefuroxime axetil, Cephalexin, Codeine, Doxycycline, Erythromycin base, Linaclotide, Statins, and Telmisartan   Social History   Socioeconomic History   Marital status: Widowed    Spouse name: Not on file   Number of children: Not on  file   Years of education: Not on file   Highest education level: Not on file  Occupational History   Occupation: retired Therapist, sports  Tobacco Use   Smoking status: Former    Packs/day: 1.00    Years: 30.00    Total pack years: 30.00    Types: Cigarettes    Quit date: 03/31/1998    Years since quitting: 23.8   Smokeless tobacco: Never   Tobacco comments:    Quit intermittently since her 69s.  Vaping Use   Vaping Use: Never used  Substance and Sexual Activity   Alcohol use: Yes    Alcohol/week: 0.0 standard drinks of alcohol    Comment: Rare.   Drug use: No   Sexual activity: Not on file  Other Topics Concern   Not on file  Social History Narrative   Originally from [place]. She has traveled to [place], [place], [place], & [place]. Previously worked as a Marine scientist. No known TB exposure. No pets currently. No bird, hot tube, or asbestos exposure. Enjoys playing bridge.    Social Determinants of Health   Financial Resource Strain: Not on file  Food Insecurity: Not on file  Transportation Needs: Not on file  Physical Activity: Not on file  Stress: Not on file  Social Connections: Not on file     Family History: The patient's family history includes Allergies in her mother; Heart attack in her father; Hyperlipidemia in her mother; Prostate cancer in her maternal grandfather. There is no history of Lung disease.  ROS:   Please see the history of present illness.    Review of Systems  Constitutional:  Positive for malaise/fatigue. Negative for chills and fever.  HENT:  Negative for hearing loss.   Eyes:  Negative for redness.  Respiratory:  Positive for shortness of breath.   Cardiovascular:  Positive for  palpitations. Negative for chest pain, orthopnea, claudication, leg swelling and PND.  Gastrointestinal:  Negative for melena, nausea and vomiting.  Genitourinary:  Negative for flank pain.  Musculoskeletal:  Negative for joint pain and myalgias.  Neurological:  Negative for dizziness and loss of consciousness.  Endo/Heme/Allergies:  Negative for polydipsia.  Psychiatric/Behavioral:  Negative for substance abuse.     EKGs/Labs/Other Studies Reviewed:    The following studies were reviewed today: TTE 12-11-2021: IMPRESSIONS     1. Left ventricular ejection fraction, by estimation, is 65 to 70%. The  left ventricle has hyperdynamic function. The left ventricle has no  regional wall motion abnormalities. Left ventricular diastolic parameters  are consistent with Grade II diastolic  dysfunction (pseudonormalization).   2. Right ventricular systolic function is normal. The right ventricular  size is normal.   3. Left atrial size was moderately dilated.   4. Right atrial size was mildly dilated.   5. The mitral valve is degenerative. Moderate mitral valve regurgitation.  Mild to moderate mitral stenosis, mean gradient 6 mmHg. Severe mitral  annular calcification.   6. Peak RV-RA gradient 42 mmHg. Tricuspid valve regurgitation is mild.   7. IVC not visualized.   TTE 09/02/21: IMPRESSIONS   1. Left ventricular ejection fraction, by estimation, is 60 to 65%. The  left ventricle has normal function. The left ventricle has no regional  wall motion abnormalities. Left ventricular diastolic parameters are  indeterminate. Elevated left ventricular  end-diastolic pressure.   2. Right ventricular systolic function is normal. The right ventricular  size is normal. There is normal pulmonary artery systolic pressure. The  estimated right ventricular systolic pressure is  23.1 mmHg.   3. The mitral valve is degenerative. Mild mitral valve regurgitation.  Mild mitral stenosis. The mean mitral valve  gradient is 6.0 mmHg. Moderate  mitral annular calcification.   4. The aortic valve is normal in structure. Aortic valve regurgitation is  not visualized. No aortic stenosis is present.   5. The inferior vena cava is normal in size with greater than 50%  respiratory variability, suggesting right atrial pressure of 3 mmHg.   Echo 02/14/2021:  1. Left ventricular ejection fraction, by estimation, is 60 to 65%. The  left ventricle has normal function. The left ventricle has no regional  wall motion abnormalities. Left ventricular diastolic parameters are  consistent with Grade II diastolic  dysfunction (pseudonormalization).   2. Right ventricular systolic function is normal. The right ventricular  size is normal. There is normal pulmonary artery systolic pressure. The  estimated right ventricular systolic pressure is 84.1 mmHg.   3. The mitral valve is degenerative. Moderate mitral valve regurgitation.  The mean mitral valve gradient is 7.0 mmHg but MVA by VTI is 2.2 cm^2,  suspect no more than mild mitral stenosis with elevated gradient due to MR and high flow.   4. Bioprosthetic aortic valve s/p TAVR with 26 mm Medtronic Evolut Pro  THV. Mean gradient 11 mmHg. There was mild peri-valvular leakage.   5. Left atrial size was mildly dilated.   6. The inferior vena cava is normal in size with greater than 50%  respiratory variability, suggesting right atrial pressure of 3 mmHg.  Bilateral Carotid Dopplers 10/18/2020: Summary:  Right Carotid: Velocities in the right ICA are consistent with a 1-39%  stenosis.   Left Carotid: Velocities in the left ICA are consistent with a 40-59%  stenosis.   Vertebrals:  Bilateral vertebral arteries demonstrate antegrade flow.  Subclavians: Normal flow hemodynamics were seen in bilateral subclavian arteries.   Monitor 05/2020: Cardiac monitoring period was from 05/18/20-06/16/20. Predominant rhythm was NSR with average HR 83bpm; ranging from  55-138bpm There was no atrial fibrillation, SVT or VT detected Rare PVCs, rare SVE Overall, no significant arrhythmias or pauses detected on the monitor.  Echo 01/26/20 IMPRESSIONS   1. 26 mm Evolute Pro. Vmax 1.74 m/s, MG 7.2 mmHG, EOA 2.33 cm2, DI 0.74. Mild paravalvular leak is the 5-6 o'clock position. The aortic valve has been repaired/replaced. Aortic valve regurgitation is mild. There is a 26 mm CoreValve-Evolut Pro prosthetic (TAVR) valve present in the aortic position. Procedure Date: 12/20/2019.   2. Left ventricular ejection fraction, by estimation, is 65 to 70%. The  left ventricle has normal function. The left ventricle has no regional  wall motion abnormalities. Left ventricular diastolic function could not  be evaluated.   3. Right ventricular systolic function is normal. The right ventricular  size is normal. There is normal pulmonary artery systolic pressure. The  estimated right ventricular systolic pressure is 66.0 mmHg.   4. Left atrial size was mildly dilated.   5. Mild calcific mitral stenosis (degnerative). MVA by VTI 2.2 cm2. The  mitral valve is degenerative. Mild mitral valve regurgitation. Mild mitral  stenosis. The mean mitral valve gradient is 6.6 mmHg with average heart  rate of 83 bpm. Moderate to severe  mitral annular calcification.   6. The inferior vena cava is normal in size with greater than 50%  respiratory variability, suggesting right atrial pressure of 3 mmHg.   Comparison(s): Changes from prior study are noted. Mild PVL is now  present.    TAVR  OPERATIVE NOTE     Date of Procedure:                12/20/2019   Preoperative Diagnosis:      Severe Aortic Stenosis    Postoperative Diagnosis:    Same    Procedure:        Transcatheter Aortic Valve Replacement - Percutaneous Right Transfemoral Approach             Medtronic CoreValve Evolut Pro (size 26 mm, serial # [id number])              Co-Surgeons:                        Valentina Gu. Roxy Manns,  MD and Sherren Mocha, MD   Anesthesiologist:                  Belinda Block, MD   Echocardiographer:              Sanda Klein, MD   Pre-operative Echo Findings: Severe aortic stenosis Normal left ventricular systolic function   Post-operative Echo Findings: No paravalvular leak Normal left ventricular systolic function   _____________   Cath 08/08/19: Moderate aortic stenosis with peak to peak gradient of 33 mmHg, mean gradient 29 mmHg, and calculated aortic valve area of 0.81 cm. Normal pulmonary pressures with mean arterial pressure of 23 mmHg. 3 mmHg gradient across the mitral valve Normal left ventricular systolic function with EF 65%. Widely patent left main Proximal LAD saccular aneurysm not substantially different in appearance than 2006 and 2012 angiogram comparisons.  Luminal irregularities are noted throughout the LAD territory. Luminal irregularities noted throughout the circumflex. The RCA is dominant, tortuous, and contains a patent mid to distal stent beyond several acute bends.  Diffuse luminal irregularities are noted otherwise. Scattered significant calcification is noted in the coronary arterial tree, the thoracic aorta, and mitral annulus.   RECOMMENDATIONS:   There is discordance between aortic stenosis and coronary disease severity and symptoms.  Dyspnea on exertion may be multifactorial. LAD aneurysm is stable. Consider referral to aortic valve clinic for an opinion. May need pulmonary evaluation. May need transesophageal echo to exclude the possibility of mitral valve disease being more significant than suggested by current data.     Echo 12/21/19:  IMPRESSIONS   1. A 3.5 m/s intracavitary "gradient" is present due to hyperdynamic left  ventricular contraction. Left ventricular ejection fraction, by  estimation, is 70 to 75%. The left ventricle has hyperdynamic function.  The left ventricle has no regional wall  motion abnormalities. Left  ventricular diastolic parameters are consistent  with Grade II diastolic dysfunction (pseudonormalization). Elevated left  atrial pressure.   2. Right ventricular systolic function is normal. The right ventricular  size is normal. There is mildly elevated pulmonary artery systolic  pressure.   3. The mitral valve is normal in structure. Mild to moderate mitral valve  regurgitation. No evidence of mitral stenosis. Moderate mitral annular  calcification.   4. The aortic valve has been repaired/replaced. Aortic valve  regurgitation is not visualized. No aortic stenosis is present. There is a  CoreValve-Evolut Pro prosthetic (TAVR) valve present in the aortic  position. Procedure Date: 12/20/2019.   5. The inferior vena cava is normal in size with greater than 50%  respiratory variability, suggesting right atrial pressure of 3 mmHg.    __________________   ZIo AT 12/22/19 Study Highlights   Basic rhythm is NSR Non Sustained SVT  at rates 115 to 214 bpm. Longest 11 seconds No atrial fibrillation SVT burden < 1%   _________________   Coronary CT 11/03/2019: IMPRESSION: 1. Aortic Valve: Tricuspid aortic valve. Severely reduced cusp separation. Severely thickened, moderately calcified aortic valve cusps.   2.  AV calcium score: 839   3.  LVOT calcifications extending to anterior mitral leaflet.   4. Borderline height of the left main coronary artery ostia, 11 mm. Adequate height of the RCA ostium.   5. Based on these findings, the annulus measures within range for a 20 mm Edwards Sapien 3 valve. Sizing is borderline for 23 mm vs 26 mm Medtronic Evolut Pro supra-annular valve. Recommend Structural Heart Team discussion for valve selection.   6. Optimum Fluoroscopic Angle for Delivery: LAO 1 CAU 1  EKG:    EKG is personally reviewed. 01/28/22: EKG is not ordered 10/28/21: NSR with HR 89  Recent Labs: 11/19/2021: BUN 17; Creatinine, Ser 0.86; Magnesium 2.2; NT-Pro BNP 250;  Potassium 4.6; Sodium 138   Recent Lipid Panel    Component Value Date/Time   CHOL 157 12/22/2019 0841   CHOL 150 07/04/2019 0749   TRIG 151 (H) 12/22/2019 0841   HDL 67 12/22/2019 0841   HDL 68 07/04/2019 0749   CHOLHDL 2.3 12/22/2019 0841   VLDL 30 12/22/2019 0841   LDLCALC 60 12/22/2019 0841   LDLCALC 64 07/04/2019 0749     Risk Assessment/Calculations:       Physical Exam:    VS:  BP (!) 140/72   Pulse 78   Ht _0  (1.6 m)   Wt 133 lb (60.3 kg)   SpO2 96%   BMI 23.56 kg/m     Wt Readings from Last 3 Encounters:  01/28/22 133 lb (60.3 kg)  12/12/21 135 lb (61.2 kg)  11/19/21 133 lb (60.3 kg)     GEN:  Well nourished, well developed in no acute distress HEENT: Normal NECK: No JVD; No carotid bruits CARDIAC: RRR, sparse etopic beats, 2/6 early-peaking systolic murmur, 1/6 diastolic murmur RESPIRATORY:  Clear to auscultation without rales, wheezing or rhonchi  ABDOMEN: Soft, non-tender, non-distended MUSCULOSKELETAL:  No edema; No deformity  SKIN: Warm and dry NEUROLOGIC:  Alert and oriented x 3 PSYCHIATRIC:  Normal affect   ASSESSMENT:    1. SVT (supraventricular tachycardia)   2. S/P TAVR (transcatheter aortic valve replacement)   3. Mitral valve stenosis, unspecified etiology   4. Coronary artery disease of native artery of native heart with stable angina pectoris ([place])   5. Essential hypertension   6. Amaurosis fugax of left eye   7. Severe aortic stenosis     PLAN:    In order of problems listed above:  #Severe, paradoxical AS s/p TAVR 12/20/19: TTE 08/2021 with LVEF 60-65% with normal functioning TAVR with mean gradient 51mHg and mild PVL. NYHA class II symptoms. Repeat TTE on 076/5465for diastolic murmur showed stable EF and gradients. Mean AoV gradient 168mg, DI 0.6, mild AR. -Continue ASA 816maily -Continue plavix 38m56mily as above -SBE ppx prescribed  #Concern for Amaurosis Fugax: No recurrence. Patient with 2 episodes in 2022 of  the sensation of a "shade coming over her left eye" that lasted for several hours before resolving. Symptoms highly concerning for amaurosis fugax. Multiple monitors with no evidence of Afib, however, patient has frequent palpitations. TTE 01/2021 with EF 60%, normally functioning TAVR with a mean gradient of 8 mmHg and mild PVL. There is mod MR/mild MS and mild TR.  Given concern for amaurosis fugax, we referred for loop but patient declined due to cost. Now monitoring with apple watch.  -Continue apple watch -Continue plavix 106m daily  -Continue ASA 862mdaily -Continue repatha 14048m2weeks  #SVT: #Palpitations: Patient with 19 beat episode of SVT on monitor while at cardiac rehab. Has known history of SVT. Cardiac monitor 05/2021 with no episodes of Afib but some runs of SVT. Now monitoring with apple watch as detailed above. She has not tolerated metop and wants to change back to coreg. Will also trial dilt as needed for breakthrough. 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  -Change metop back to coreg 6.71mg B3ms felt very fatigued on metop -Serial TTEs for monitoring   #Post-Op CVA: Symptoms resolved.  -Continue ASA 81mg da2mand plavix 75mg dai26ms above   #Carotid Artery Disease: Mild on most recent dopplers on 11/19/21 at 1-39% bilaterally -Continue ASA 81mg dail33md plavix 75mg daily27mabove -On repatha 140mg q2week48m#CAD s/p RCA PCI in the past: No anginal symptoms. -Continue ASA 81mg daily a58mlavix 75mg daily as71mve -Change metop back to coreg 6.71mg BID as fe74mery fatigued on metop -Continue losartan 71mg daily   #H67m-Losartan 71mg daily -Chan52metop back to coreg 6.71mg BID as felt 85m fatigued on metop  #Mild LE  edema: -Continue lasix 20mg prn as needed18mollow-up:  3 months.  Medication Adjustments/Labs and Tests Ordered: Current medicines are reviewed at length with the patient today.  Concerns regarding medicines are outlined above.   No orders of the defined types were placed in this encounter.  Meds ordered this encounter  Medications   carvedilol (COREG) 6.25 MG tablet    Sig: Take 1 tablet (6.25 mg total) by mouth 2 (two) times daily.    Dispense:  180 tablet    Refill:  3   diltiazem (CARDIZEM) 30 MG tablet    Sig: Take 1 tablet (30 mg total) by mouth 2 (two) times daily as needed (breakthrough palpitations).    Dispense:  60 tablet    Refill:  1   Patient Instructions  Medication Instructions:   STOP TAKING METOPROLOL SUCCINATE NOW  START TAKING CARVEDILOL (COREG) 6.25 MG BY MOUTH TWICE DAILY  START TAKING DILTIAZEM (CARDIZEM) 30 MG BY MOUTH TWICE DAILY AS NEEDED FOR BREAKTHROUGH PALPITATIONS  *If you need a refill on your cardiac medications before your next appointment, please call your pharmacy*    Follow-Up:  3 MONTHS WITH ERNEST DICK NP   Important Information About Sugar       I,Cheryl Hernandez,acting as a scribe for Cheryl Sabado E PembertonFreada Bergeroned all relevant documentation on the behalf of Cheryl Stach E PembertonFreada Bergeron  Horace Lukas E PembertonFreada Bergeronresence of Brandn Mcgath E PembertonFreada Bergeron PembertonFreada Bergeron all documentation for this visit. The documentation on 01/28/22 for the exam, diagnosis, procedures, and orders are all accurate and complete.    Signed, Malavika Lira E PembertonFreada Bergeron:38 PM    [facility]

## 2022-01-28 NOTE — Patient Instructions (Signed)
Medication Instructions:   STOP TAKING METOPROLOL SUCCINATE NOW  START TAKING CARVEDILOL (COREG) 6.25 MG BY MOUTH TWICE DAILY  START TAKING DILTIAZEM (CARDIZEM) 30 MG BY MOUTH TWICE DAILY AS NEEDED FOR BREAKTHROUGH PALPITATIONS  *If you need a refill on your cardiac medications before your next appointment, please call your pharmacy*    Follow-Up:  3 MONTHS WITH ERNEST DICK NP   Important Information About Sugar

## 2022-04-11 ENCOUNTER — Other Ambulatory Visit (HOSPITAL_COMMUNITY): Payer: Self-pay

## 2022-04-21 ENCOUNTER — Ambulatory Visit: Payer: Medicare Other | Admitting: Nurse Practitioner

## 2022-04-25 ENCOUNTER — Other Ambulatory Visit: Payer: Self-pay | Admitting: Interventional Cardiology

## 2022-04-25 DIAGNOSIS — I25119 Atherosclerotic heart disease of native coronary artery with unspecified angina pectoris: Secondary | ICD-10-CM

## 2022-04-25 DIAGNOSIS — E7800 Pure hypercholesterolemia, unspecified: Secondary | ICD-10-CM

## 2022-05-20 ENCOUNTER — Ambulatory Visit: Payer: Medicare Other | Admitting: Podiatry

## 2022-05-20 ENCOUNTER — Encounter: Payer: Self-pay | Admitting: Podiatry

## 2022-05-20 DIAGNOSIS — E1151 Type 2 diabetes mellitus with diabetic peripheral angiopathy without gangrene: Secondary | ICD-10-CM

## 2022-05-20 DIAGNOSIS — Q828 Other specified congenital malformations of skin: Secondary | ICD-10-CM

## 2022-05-20 DIAGNOSIS — L84 Corns and callosities: Secondary | ICD-10-CM | POA: Diagnosis not present

## 2022-05-20 NOTE — Progress Notes (Unsigned)
  Subjective:  Patient ID: Cheryl Hernandez, female    DOB: Feb 20, 1941,  MRN: [id number]  Cheryl Hernandez presents to clinic today for {jgcomplaint:23593}  Chief Complaint  Patient presents with   Callouses    Right foot callus debridement  BS- did not check today A1C-5.? PCP-[facility] PCP VST-6 months ago   New problem(s): None. {jgcomplaint:23593}  PCP is [facility], Ravisankar, MD.  Allergies  Allergen Reactions   Amlodipine Swelling   Budesonide-Formoterol Fumarate     Other Reaction(s): had reaction, but taking lower dose now   Cefuroxime Axetil Hives and Itching   Cephalexin Other (See Comments)    Total body pain in joints, couldn't swallow    Codeine     Tolerates Dilaudid.   Doxycycline     REACTION: vomiting/ GI upset   Erythromycin Base Other (See Comments)    unknown   Linaclotide     Other reaction(s): Diarrhea   Statins     Myalgia with crestor   Telmisartan     Other reaction(s): Unknown   Tiotropium Bromide Monohydrate     Other Reaction(s): Unknown    Review of Systems: Negative except as noted in the HPI.  Objective: No changes noted in today's physical examination. There were no vitals filed for this visit. Cheryl Hernandez is a pleasant 82 y.o. female WD, WN in NAD. AAO x 3.  Vascular Capillary refill time to digits immediate b/l. Palpable DP pulse(s) b/l lower extremities Nonpalpable PT pulse(s) b/l lower extremities. Pedal hair sparse. Lower extremity skin temperature gradient within normal limits. No pain with calf compression b/l. No edema noted b/l lower extremities. No cyanosis or clubbing noted.  Neurologic Normal speech. Oriented to person, place, and time. Protective sensation intact 5/5 intact bilaterally with 10g monofilament b/l. Vibratory sensation intact b/l.  Dermatologic Pedal skin with normal turgor, texture and tone bilaterally. No open wounds bilaterally. No interdigital macerations bilaterally. Toenails 1-5 bilaterally well maintained  with adequate length. No erythema, no edema, no drainage, no fluctuance. Porokeratotic lesion(s) submet head 2 right foot. No erythema, no edema, no drainage, no fluctuance.  Orthopedic: Normal muscle strength 5/5 to all lower extremity muscle groups bilaterally. No pain crepitus or joint limitation noted with ROM b/l. Hallux valgus with bunion deformity noted b/l lower extremities. Hammertoe(s) noted to the 2-5 bilaterally.   Radiographs: None Assessment/Plan: No diagnosis found.  No orders of the defined types were placed in this encounter.   None {Jgplan:23602::"-Patient/POA to call should there be question/concern in the interim."}   No follow-ups on file.  Marzetta Board, DPM

## 2022-05-26 ENCOUNTER — Ambulatory Visit: Payer: Medicare Other | Admitting: Podiatry

## 2022-05-26 DIAGNOSIS — L84 Corns and callosities: Secondary | ICD-10-CM | POA: Diagnosis not present

## 2022-06-04 NOTE — Progress Notes (Signed)
Subjective: Chief Complaint  Patient presents with   Callouses   82 year old female presents the office today for follow-up evaluation of preulcerative callus on her right foot submetatarsal 2.  She does not see any opening, swelling or redness or any drainage.  She was recently seen by Dr. Elisha Ponder for this.  Objective: AAO x3, NAD DP/PT pulses palpable bilaterally, CRT less than 3 seconds Right foot submetatarsal 2 hyperkeratotic lesion with some dried blood present under the callus.  Upon debridement still preulcerative there is no open lesion.  There is no drainage or pus.  There is no fluctuance or crepitation.  There is no malodor.  No obvious signs of infection. Prominent metatarsal head laterally. No pain with calf compression, swelling, warmth, erythema  Assessment: 82 year old female with preulcerative callus right foot  Plan: -All treatment options discussed with the patient including all alternatives, risks, complications.  -Sharply debrided hyperkeratotic lesion with any complications or bleeding.  Recommend moisturizer and offloading.  Further offloading applied.  Monitor for any skin breakdown and daily foot inspection discussed. -Patient encouraged to call the office with any questions, concerns, change in symptoms.   Trula Slade DPM

## 2022-06-23 ENCOUNTER — Ambulatory Visit: Payer: Medicare Other | Admitting: Podiatry

## 2022-07-18 ENCOUNTER — Encounter (INDEPENDENT_AMBULATORY_CARE_PROVIDER_SITE_OTHER): Payer: Self-pay

## 2022-08-05 ENCOUNTER — Other Ambulatory Visit: Payer: Self-pay

## 2022-08-05 MED ORDER — CLOPIDOGREL BISULFATE 75 MG PO TABS: 75.0000 mg | ORAL_TABLET | Freq: Every day | ORAL | 1 refills | Status: AC

## 2022-08-18 NOTE — Progress Notes (Unsigned)
Cardiology Office Note:    Date:  08/20/2022   ID:  Cheryl, Hernandez October 01, 1940, MRN [id number]  PCP:  Cheryl Greathouse, MD   [facility]  Cardiologist:  Cheryl Noe, MD (Inactive)  Advanced Practice Provider:  No care team member to display Electrophysiologist:  None   Referring MD: Cheryl Greathouse, MD    History of Present Illness:    Cheryl Hernandez is a 82 y.o. 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   Patient has a history of NSTEMI s/p PCI to RCA in 2009. She was followed by Dr. Katrinka Hernandez and noted  a 2-3 year hx of progressive exertional shortness of breath and fatigue. TTEs revealed presence of aortic stenosis that has gradually increased in severity. Diagnostic cath  was performed by Dr. 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 Catheterization also confirmed the presence of aortic stenosis with peak to peak and mean transvalvular gradients measured 33 and 29 mmHg respectively, corresponding to aortic valve area calculated only 0.81 cm.  Right-sided pressures were normal. Follow-up TTE 10/07/19 revealed paradoxical severe low-flow low gradient aortic stenosis.   She was evaluated by the multidisciplinary valve team and underwent successful TAVR with a 26 mm Medtronic Evolut Pro + THV via the TF approach on 12/20/19. Post operative TTE showed EF 70%, normally functioning TAVR with a mean gradient of 10.5 mmHg and no PVL. Mild-mod MR. Unfortunately, patient developed a persistent headache and blurry vision s/p TAVR. 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 She was seen by neurology and  underwent full stroke work up. She was discharged on aspirin and plavix. Also had a Zio patch which showed no atrial fibrillation. In follow up she complained of persistent visual issues and hallucinations. She was referred to Dr. Jimmye Hernandez with neuro psych.   On 05/02/20, patient had 19 beat run of SVT with HR 150-175 during cardiac rehab. Converted back to SR afterwards with HR 90s. Seen on 05/14/20 where she was doing overall well. She was having some daytime fatigue which she thought may have been related to her bystolic. We changed her to coreg. During our last visit on 06/28/20, we increased her coreg due to palpitations and rapid HR which were bothersome to her.  She was seen back in clinic on 08/30/20 for follow up and complained of white floaters and 2 episodes of the sensation of a "shade coming over her left eye" worrisome for amaurosis fugax. Of note these episodes occurred off plavix. She was put back on DAPT with aspirin and plavix and referred to Dr. Ladona Hernandez for consideration of a loop and seen on 10/04/20. Plan was to repeat carotid dopplers prior to considering loop. This showed 1-39% RICA and 40-59% LICA stenosis. She was seen by Dr. Darrick Hernandez who offered her arch aortogram carotid angiogram for further evaluation as this would be considering looking for ulcerated plaque.  Other potential imaging would be a CT angiogram but this would be less diagnostic for ulcerated plaque. Ultimately, continued medical management was decided with 1 year follow up. She was seen by neuro on 10/19/20 who agreed for evaluating for afib. Loop recorder insertion was planned for  11/21/20 but pt cancelled due to not knowing how much it might cost. She is now wearing an apple watch that monitor for afib.  Was seen in clinic on 05/2021 where she was doing well from a CV standpoint. Palpitations generally controlled. Tolerating the coreg.   Was seen in clinic on 10/28/21 where she was stable from a CV standpoint. Had  significant diastolic murmur on exam. TTE showed EF 65-70%, G2DD, normal RV, moderate LAE, moderate MR, mild to moderate MS with mean gradient , normal functioning TAVR with mean gradient , DI 0.63, mild AI.   Seen by Cheryl Hernandez on 11/2021. Blood pressures were better controlled but she was having frequent ectopy and chest tightness. Her coreg was changed to metop 37.5mg  BID.  Last seen in 12/2021 where she was feeling poorly since starting metop and we changed her back to coreg at that time.   Today, the patient states that she has been having episodes of palpitations with rapid Hrs in the 90s. Had some associated chest pressure at that time. Also notes some SOB and LE edema that has been ongoing for the past couple of days. She is amenable to trialing a higher dose of the coreg to see if this helps. She feels very limited in terms of her ability to be active due to her symptoms.   Past Medical History:  Diagnosis Date   Arthritis    Asthma    CAD (coronary artery disease)    a. NSTEMI 2009 s/p DES to RCAx2.   Carotid artery disease (HCC)    a. Mild-moderate - followed by [facility].   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 Excell Hernandez and Dr. Cornelius Hernandez    Severe aortic stenosis     Past Surgical History:  Procedure Laterality Date   ANGIOPLASTY     CORONARY ANGIOPLASTY WITH STENT PLACEMENT     RIGHT/LEFT HEART CATH AND CORONARY ANGIOGRAPHY  N/A 08/08/2019   Procedure: RIGHT/LEFT HEART CATH AND CORONARY ANGIOGRAPHY;  Surgeon: Cheryl Records, MD;  Location: [facility] INVASIVE CV LAB;  Service: Cardiovascular;  Laterality: N/A;   TEE WITHOUT CARDIOVERSION N/A 12/20/2019   Procedure: TRANSESOPHAGEAL ECHOCARDIOGRAM (TEE);  Surgeon: Tonny Bollman, MD;  Location: [facility] INVASIVE CV LAB;  Service: Open Heart Surgery;  Laterality: N/A;   TONSILLECTOMY     TOTAL ABDOMINAL HYSTERECTOMY     TOTAL HIP ARTHROPLASTY     TRANSCATHETER AORTIC VALVE REPLACEMENT, TRANSFEMORAL N/A 12/20/2019   Procedure: TRANSCATHETER AORTIC VALVE REPLACEMENT, TRANSFEMORAL;  Surgeon: Tonny Bollman, MD;  Location: [facility] INVASIVE CV LAB;  Service: Open Heart Surgery;  Laterality: N/A;    Current Medications: Current Meds  Medication Sig   acetaminophen (TYLENOL) 500 MG tablet Take 500 mg by mouth every 6 (six) hours as needed (for arthritis pain.).   ACETYLCARNITINE HCL  PO Take by mouth daily.   albuterol (VENTOLIN HFA) 108 (90 Base) MCG/ACT inhaler Inhale 2 puffs into the lungs every 6 (six) hours as needed for wheezing or shortness of breath.   Alpha-D-Galactosidase (BEANO PO) Take 1 tablet by mouth daily as needed (flatulence/abdominal bloating.).   ALPRAZolam (XANAX) 0.25 MG tablet Take 0.5 tablets by mouth as needed.   aspirin EC 81 MG tablet Take 81 mg by mouth every evening.   B Complex-C (B-COMPLEX WITH VITAMIN C) tablet Take 1 tablet by mouth daily.    budesonide (ENTOCORT EC) 3 MG 24 hr capsule Take 2 capsules (6 mg total) by mouth 2 (two) times daily as needed (diarrhea).   budesonide-formoterol (SYMBICORT) 80-4.5 MCG/ACT inhaler Inhale 2 puffs into the lungs 2 (two) times daily as needed (congestion).   calcium carbonate (TUMS - DOSED IN MG ELEMENTAL CALCIUM) 500 MG chewable tablet Chew 1 tablet by mouth 3 (three) times daily as needed for heartburn.    carvedilol (COREG) 12.5 MG tablet Take 1 tablet (12.5 mg total) by mouth 2 (two) times daily.   Cholecalciferol  (VITAMIN D3) 75 MCG (3000 UT) TABS Take 3,000 Units by mouth every other day.   clopidogrel (PLAVIX) 75 MG tablet Take 1 tablet (75 mg total) by mouth daily.   Coenzyme Q10 200 MG capsule Take 200 mg by mouth daily.   diltiazem (CARDIZEM) 30 MG tablet Take 1 tablet (30 mg total) by mouth 2 (two) times daily as needed (breakthrough palpitations).   docusate sodium (COLACE) 100 MG capsule 100 mg as needed.   Evolocumab (REPATHA SURECLICK) 140 MG/ML SOAJ INJECT 1 ML INTO THE SKIN ONCE EVERY 14 DAYS   fexofenadine (ALLEGRA) 180 MG tablet Take 180 mg by mouth daily.   Glucose Blood (FREESTYLE LITE TEST VI) Once daily   guaifenesin (HUMIBID E) 400 MG TABS tablet Take 400 mg by mouth daily.   KRILL OIL PO Take 1 capsule by mouth daily.   Magnesium Citrate 125 MG CAPS Take 125 mg by mouth daily.   metFORMIN (GLUCOPHAGE-XR) 500 MG 24 hr tablet Take 500 mg by mouth daily.   Misc Natural Products (LEG VEIN & CIRCULATION PO) Take by mouth daily.   Multiple Vitamins-Minerals (MULTIVITAMIN WITH MINERALS) tablet Take 1 tablet by mouth every other day. In the morning   nitroGLYCERIN (NITROSTAT) 0.4 MG SL tablet Place 1 tablet (0.4 mg total) under the tongue every 5 (five) minutes as needed for chest pain.   Probiotic Product (ALIGN) 4 MG CAPS Take 4 mg by mouth every other day.   Turmeric 400 MG CAPS Take by mouth daily.   UNABLE TO FIND Med Name: saffron 15   [DISCONTINUED] carvedilol (COREG) 6.25 MG tablet Take 1 tablet (6.25 mg total) by mouth 2 (two) times daily.   [DISCONTINUED] furosemide (LASIX) 20 MG tablet Take 1 tablet (20 mg total) by mouth daily. (Patient taking differently: Take 20 mg by mouth as needed.)     Allergies:   Amlodipine, Budesonide-formoterol fumarate, Cefuroxime axetil, Cephalexin, Codeine, Doxycycline, Erythromycin base, Linaclotide, Statins, Telmisartan, and Tiotropium bromide monohydrate   Social History   Socioeconomic History   Marital status: Widowed    Spouse name: Not  on file   Number of children: Not on file   Years of education: Not on file   Highest education level: Not on file  Occupational History   Occupation: retired Charity fundraiser  Tobacco Use   Smoking status: Former    Packs/day: 1.00    Years: 30.00  Additional pack years: 0.00    Total pack years: 30.00    Types: Cigarettes    Quit date: 03/31/1998    Years since quitting: 24.4   Smokeless tobacco: Never   Tobacco comments:    Quit intermittently since her 75s.  Vaping Use   Vaping Use: Never used  Substance and Sexual Activity   Alcohol use: Yes    Alcohol/week: 0.0 standard drinks of alcohol    Comment: Rare.   Drug use: No   Sexual activity: Not on file  Other Topics Concern   Not on file  Social History Narrative   Originally from [place]. She has traveled to [place], [place], [place], & [place]. Previously worked as a Engineer, civil (consulting). No known TB exposure. No pets currently. No bird, hot tube, or asbestos exposure. Enjoys playing bridge.    Social Determinants of Health   Financial Resource Strain: Not on file  Food Insecurity: Not on file  Transportation Needs: Not on file  Physical Activity: Not on file  Stress: Not on file  Social Connections: Not on file     Family History: The patient's family history includes Allergies in her mother; Heart attack in her father; Hyperlipidemia in her mother; Prostate cancer in her maternal grandfather. There is no history of Lung disease.  ROS:   Please see the history of present illness.    Review of Systems  Constitutional:  Positive for malaise/fatigue. Negative for chills and fever.  HENT:  Negative for hearing loss.   Eyes:  Negative for redness.  Respiratory:  Positive for shortness of breath.   Cardiovascular:  Positive for chest pain and palpitations. Negative for orthopnea, claudication, leg swelling and PND.  Gastrointestinal:  Negative for melena, nausea and vomiting.  Genitourinary:  Negative for flank pain.  Musculoskeletal:  Negative for joint  pain and myalgias.  Neurological:  Negative for dizziness and loss of consciousness.  Endo/Heme/Allergies:  Negative for polydipsia.  Psychiatric/Behavioral:  Negative for substance abuse.     EKGs/Labs/Other Studies Reviewed:    The following studies were reviewed today: TTE 2021/11/20: IMPRESSIONS     1. Left ventricular ejection fraction, by estimation, is 65 to 70%. The  left ventricle has hyperdynamic function. The left ventricle has no  regional wall motion abnormalities. Left ventricular diastolic parameters  are consistent with Grade II diastolic  dysfunction (pseudonormalization).   2. Right ventricular systolic function is normal. The right ventricular  size is normal.   3. Left atrial size was moderately dilated.   4. Right atrial size was mildly dilated.   5. The mitral valve is degenerative. Moderate mitral valve regurgitation.  Mild to moderate mitral stenosis, mean gradient 6 mmHg. Severe mitral  annular calcification.   6. Peak RV-RA gradient 42 mmHg. Tricuspid valve regurgitation is mild.   7. IVC not visualized.   TTE 09/02/21: IMPRESSIONS   1. Left ventricular ejection fraction, by estimation, is 60 to 65%. The  left ventricle has normal function. The left ventricle has no regional  wall motion abnormalities. Left ventricular diastolic parameters are  indeterminate. Elevated left ventricular  end-diastolic pressure.   2. Right ventricular systolic function is normal. The right ventricular  size is normal. There is normal pulmonary artery systolic pressure. The  estimated right ventricular systolic pressure is 23.1 mmHg.   3. The mitral valve is degenerative. Mild mitral valve regurgitation.  Mild mitral stenosis. The mean mitral valve gradient is 6.0 mmHg. Moderate  mitral annular calcification.   4. The aortic valve is  normal in structure. Aortic valve regurgitation is  not visualized. No aortic stenosis is present.   5. The inferior vena cava is normal in  size with greater than 50%  respiratory variability, suggesting right atrial pressure of 3 mmHg.   Echo 02/14/2021:  1. Left ventricular ejection fraction, by estimation, is 60 to 65%. The  left ventricle has normal function. The left ventricle has no regional  wall motion abnormalities. Left ventricular diastolic parameters are  consistent with Grade II diastolic  dysfunction (pseudonormalization).   2. Right ventricular systolic function is normal. The right ventricular  size is normal. There is normal pulmonary artery systolic pressure. The  estimated right ventricular systolic pressure is 33.5 mmHg.   3. The mitral valve is degenerative. Moderate mitral valve regurgitation.  The mean mitral valve gradient is 7.0 mmHg but MVA by VTI is 2.2 cm^2,  suspect no more than mild mitral stenosis with elevated gradient due to MR and high flow.   4. Bioprosthetic aortic valve s/p TAVR with 26 mm Medtronic Evolut Pro  THV. Mean gradient 11 mmHg. There was mild peri-valvular leakage.   5. Left atrial size was mildly dilated.   6. The inferior vena cava is normal in size with greater than 50%  respiratory variability, suggesting right atrial pressure of 3 mmHg.  Bilateral Carotid Dopplers 10/18/2020: Summary:  Right Carotid: Velocities in the right ICA are consistent with a 1-39%  stenosis.   Left Carotid: Velocities in the left ICA are consistent with a 40-59%  stenosis.   Vertebrals:  Bilateral vertebral arteries demonstrate antegrade flow.  Subclavians: Normal flow hemodynamics were seen in bilateral subclavian arteries.   Monitor 05/2020: Cardiac monitoring period was from 05/18/20-06/16/20. Predominant rhythm was NSR with average HR 83bpm; ranging from 55-138bpm There was no atrial fibrillation, SVT or VT detected Rare PVCs, rare SVE Overall, no significant arrhythmias or pauses detected on the monitor.  Echo 01/26/20 IMPRESSIONS   1. 26 mm Evolute Pro. Vmax 1.74 m/s, MG 7.2 mmHG,  EOA 2.33 cm2, DI 0.74. Mild paravalvular leak is the 5-6 o'clock position. The aortic valve has been repaired/replaced. Aortic valve regurgitation is mild. There is a 26 mm CoreValve-Evolut Pro prosthetic (TAVR) valve present in the aortic position. Procedure Date: 12/20/2019.   2. Left ventricular ejection fraction, by estimation, is 65 to 70%. The  left ventricle has normal function. The left ventricle has no regional  wall motion abnormalities. Left ventricular diastolic function could not  be evaluated.   3. Right ventricular systolic function is normal. The right ventricular  size is normal. There is normal pulmonary artery systolic pressure. The  estimated right ventricular systolic pressure is 29.8 mmHg.   4. Left atrial size was mildly dilated.   5. Mild calcific mitral stenosis (degnerative). MVA by VTI 2.2 cm2. The  mitral valve is degenerative. Mild mitral valve regurgitation. Mild mitral  stenosis. The mean mitral valve gradient is 6.6 mmHg with average heart  rate of 83 bpm. Moderate to severe  mitral annular calcification.   6. The inferior vena cava is normal in size with greater than 50%  respiratory variability, suggesting right atrial pressure of 3 mmHg.   Comparison(s): Changes from prior study are noted. Mild PVL is now  present.    TAVR OPERATIVE NOTE     Date of Procedure:                12/20/2019   Preoperative Diagnosis:      Severe Aortic Stenosis  Postoperative Diagnosis:    Same    Procedure:        Transcatheter Aortic Valve Replacement - Percutaneous Right Transfemoral Approach             Medtronic CoreValve Evolut Pro (size 26 mm, serial # [id number])              Co-Surgeons:                        Salvatore Decent. Cheryl Moras, MD and Tonny Bollman, MD   Anesthesiologist:                  Dorris Singh, MD   Echocardiographer:              Thurmon Fair, MD   Pre-operative Echo Findings: Severe aortic stenosis Normal left ventricular systolic function    Post-operative Echo Findings: No paravalvular leak Normal left ventricular systolic function   _____________   Cath 08/08/19: Moderate aortic stenosis with peak to peak gradient of 33 mmHg, mean gradient 29 mmHg, and calculated aortic valve area of 0.81 cm. Normal pulmonary pressures with mean arterial pressure of 23 mmHg. 3 mmHg gradient across the mitral valve Normal left ventricular systolic function with EF 65%. Widely patent left main Proximal LAD saccular aneurysm not substantially different in appearance than 2006 and 2012 angiogram comparisons.  Luminal irregularities are noted throughout the LAD territory. Luminal irregularities noted throughout the circumflex. The RCA is dominant, tortuous, and contains a patent mid to distal stent beyond several acute bends.  Diffuse luminal irregularities are noted otherwise. Scattered significant calcification is noted in the coronary arterial tree, the thoracic aorta, and mitral annulus.   RECOMMENDATIONS:   There is discordance between aortic stenosis and coronary disease severity and symptoms.  Dyspnea on exertion may be multifactorial. LAD aneurysm is stable. Consider referral to aortic valve clinic for an opinion. May need pulmonary evaluation. May need transesophageal echo to exclude the possibility of mitral valve disease being more significant than suggested by current data.     Echo 12/21/19:  IMPRESSIONS   1. A 3.5 m/s intracavitary "gradient" is present due to hyperdynamic left  ventricular contraction. Left ventricular ejection fraction, by  estimation, is 70 to 75%. The left ventricle has hyperdynamic function.  The left ventricle has no regional wall  motion abnormalities. Left ventricular diastolic parameters are consistent  with Grade II diastolic dysfunction (pseudonormalization). Elevated left  atrial pressure.   2. Right ventricular systolic function is normal. The right ventricular  size is normal. There is  mildly elevated pulmonary artery systolic  pressure.   3. The mitral valve is normal in structure. Mild to moderate mitral valve  regurgitation. No evidence of mitral stenosis. Moderate mitral annular  calcification.   4. The aortic valve has been repaired/replaced. Aortic valve  regurgitation is not visualized. No aortic stenosis is present. There is a  CoreValve-Evolut Pro prosthetic (TAVR) valve present in the aortic  position. Procedure Date: 12/20/2019.   5. The inferior vena cava is normal in size with greater than 50%  respiratory variability, suggesting right atrial pressure of 3 mmHg.    __________________   ZIo AT 12/22/19 Study Highlights   Basic rhythm is NSR Non Sustained SVT at rates 115 to 214 bpm. Longest 11 seconds No atrial fibrillation SVT burden < 1%   _________________   Coronary CT 11/03/2019: IMPRESSION: 1. Aortic Valve: Tricuspid aortic valve. Severely reduced cusp separation. Severely thickened, moderately calcified aortic  valve cusps.   2.  AV calcium score: 839   3.  LVOT calcifications extending to anterior mitral leaflet.   4. Borderline height of the left main coronary artery ostia, 11 mm. Adequate height of the RCA ostium.   5. Based on these findings, the annulus measures within range for a 20 mm Edwards Sapien 3 valve. Sizing is borderline for 23 mm vs 26 mm Medtronic Evolut Pro supra-annular valve. Recommend Structural Heart Team discussion for valve selection.   6. Optimum Fluoroscopic Angle for Delivery: LAO 1 CAU 1  EKG:    No new tracing  Recent Labs: 11/19/2021: BUN 17; Creatinine, Ser 0.86; Magnesium 2.2; NT-Pro BNP 250; Potassium 4.6; Sodium 138   Recent Lipid Panel    Component Value Date/Time   CHOL 157 12/22/2019 0841   CHOL 150 07/04/2019 0749   TRIG 151 (H) 12/22/2019 0841   HDL 67 12/22/2019 0841   HDL 68 07/04/2019 0749   CHOLHDL 2.3 12/22/2019 0841   VLDL 30 12/22/2019 0841   LDLCALC 60 12/22/2019 0841   LDLCALC  64 07/04/2019 0749     Risk Assessment/Calculations:       Physical Exam:    VS:  Pulse 99   Ht 5\' 3"  (1.6 m)   Wt 129 lb (58.5 kg)   SpO2 95%   BMI 22.85 kg/m     Wt Readings from Last 3 Encounters:  08/20/22 129 lb (58.5 kg)  01/28/22 133 lb (60.3 kg)  12/12/21 135 lb (61.2 kg)     GEN:  Well nourished, well developed in no acute distress HEENT: Normal NECK: No JVD; No carotid bruits CARDIAC: RRR, 2/6 early peaking systolic murmur, 1/6 diastolic murmur RESPIRATORY:  Clear to auscultation without rales, wheezing or rhonchi  ABDOMEN: Soft, non-tender, non-distended MUSCULOSKELETAL:  Trace edema, warm SKIN: Warm and dry NEUROLOGIC:  Alert and oriented x 3 PSYCHIATRIC:  Normal affect   ASSESSMENT:    1. Precordial pain   2. S/P TAVR (transcatheter aortic valve replacement)   3. Coronary artery disease of native artery of native heart with stable angina pectoris (HCC)   4. Essential hypertension   5. Mitral valve stenosis, unspecified etiology   6. Amaurosis fugax of left eye   7. Severe aortic stenosis   8. SVT (supraventricular tachycardia)      PLAN:    In order of problems listed above:  #Chest Tightness: #DOE: Patient with episodes of tightness and "quivering in her chest" that can occur with exertion and at rest. These episodes are distinct from her palpitations. Cath in 2021 with patent RCA stent and no significant obstructive disease. TTE 10/2021 with normal BiV function, moderate MR, mild TR, PASP +RAP. Unclear etiology of symptoms as not classic angina. Will check NM PET given known history of CAD and uptitrate BB. -Check NM PET -Increase coreg to 6.25mg  in AM and 12.5mg  in evening and up-titrate as able (has had hard time with fatigue in the past)  #Severe, paradoxical AS s/p TAVR 12/20/19: TTE 08/2021 with LVEF 60-65% with normal functioning TAVR with mean gradient and mild PVL. NYHA class II symptoms. Repeat TTE on 10/2021 for diastolic  murmur showed stable EF and gradients. Mean AoV gradient , DI 0.6, mild AR. -Continue ASA 81mg  daily -Continue plavix 75mg  daily as above -SBE ppx prescribed  #Concern for Amaurosis Fugax: No recurrence. Patient with 2 episodes in 2022 of the sensation of a "shade coming over her left eye" that lasted for several hours  before resolving. Symptoms highly concerning for amaurosis fugax. Multiple monitors with no evidence of Afib, however, patient has frequent palpitations. TTE 01/2021 with EF 60%, normally functioning TAVR with a mean gradient of 8 mmHg and mild PVL. There is mod MR/mild MS and mild TR. Given concern for amaurosis fugax, we referred for loop but patient declined due to cost. Now monitoring with apple watch.  -Continue apple watch -Continue plavix 75mg  daily  -Continue ASA 81mg  daily -Continue repatha 140mg  q2weeks  #SVT: #Palpitations: Patient with 19 beat episode of SVT on monitor while at cardiac rehab. Has known history of SVT. Cardiac monitor 05/2021 with no episodes of Afib but some runs of SVT. Now monitoring with apple watch as detailed above. 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 -Continue coreg 6.25mg  BID (did not tolerate metop due to fatigue) -Serial TTEs for monitoring   #Post-Op CVA: Symptoms resolved.  -Continue ASA 81mg  daily and plavix 75mg  daily as above   #Carotid Artery Disease: Mild on most recent dopplers on 11/19/21 at 1-39% bilaterally -Continue ASA 81mg  daily and plavix 75mg  daily as above -On repatha 140mg  q2weeks   #CAD s/p RCA PCI in the past: -Check NM PET as above given episodes of chest tightness -Continue ASA 81mg  daily and plavix  75mg  daily as above -Continue coreg as above -Continue losartan 25mg  daily   #HTN: -Elevated mainly 140s at home -Losartan 25mg  daily -Increase coreg to 6.25mg  in AM and 12.5mg  in PM with goal to be on 12.5mg  BID as able pending blood pressures  #Mild LE edema: -Continue lasix 20mg  prn as needed   Follow-up:  6 months with APP  Medication Adjustments/Labs and Tests Ordered: Current medicines are reviewed at length with the patient today.  Concerns regarding medicines are outlined above.   Orders Placed This Encounter  Procedures   NM PET CT CARDIAC PERFUSION MULTI W/ABSOLUTE BLOODFLOW   Cardiac Stress Test: Informed Consent Details: Physician/Practitioner Attestation; Transcribe to consent form and obtain patient signature   ECHOCARDIOGRAM COMPLETE   Meds ordered this encounter  Medications   carvedilol (COREG) 12.5 MG tablet    Sig: Take 1 tablet (12.5 mg total) by mouth 2 (two) times daily.    Dispense:  180 tablet    Refill:  2    Dose increase   furosemide (LASIX) 20 MG tablet    Sig: Take 1 tablet (20 mg total) by mouth daily.    Dispense:  90 tablet    Refill:  3    Dose increase   Patient Instructions  Medication Instructions:   INCREASE YOUR CARVEDILOL TO 12.5 MG BY MOUTH TWICE DAILY  *If you need a refill on your cardiac medications before your next appointment, please call your pharmacy*    Testing/Procedures:  Your physician has requested that you have an echocardiogram. Echocardiography is a painless test that uses sound waves to create images of your heart. It provides your doctor with information about the size and shape of your heart and how well your heart's chambers and valves are working. This procedure takes approximately one hour. There are no restrictions for this procedure.  SCHEDULE ECHO TO BE DONE IN AUGUST 2024 PER DR. Shari Prows  Please do NOT wear cologne, perfume, aftershave, or lotions (deodorant is allowed). Please arrive 15 minutes  prior to your appointment time.     How to Prepare for Your Cardiac PET/CT Stress Test:  1. Please do not take these medications before your test:   Medications that may interfere with the cardiac pharmacological stress agent (ex. nitrates - including erectile dysfunction medications, isosorbide mononitrate, tamulosin or beta-blockers) the day of the exam. (Erectile dysfunction medication should be held for at least 72 hrs prior to test)  DO NOT TAKE NITROGLYCERIN, CARVEDILOL THE DAY OF THIS EXAM  Your remaining medications may be taken with water.  2. Nothing to eat or drink, except water, 3 hours prior to arrival time.   NO caffeine/decaffeinated products, or chocolate 12 hours prior to arrival.  3. NO perfume, cologne or lotion  4. Total time is 1 to 2 hours; you may want to bring reading material for the waiting time.  5. Please report to Radiology at the [facility] Main Entrance 30 minutes early for your test.  [address] [place], [place] [address]  Diabetic Preparation:  Hold oral medications.  HOLD METFORMIN THE MORNING OF THIS EXAM You may take NPH and Lantus insulin. Do not take Humalog or Humulin R (Regular Insulin) the day of your test. Check blood sugars prior to leaving the house. If able to eat breakfast prior to 3 hour fasting, you may take all medications, including your insulin, Do not worry if you miss your breakfast dose of insulin - start at your next meal.    In preparation for your appointment, medication and supplies will be purchased.  Appointment availability is limited, so if you need to cancel or reschedule, please call the Radiology Department at [phone]  24 hours in advance to avoid a cancellation fee of $100.00  What to Expect After you Arrive:  Once you arrive and check in for your appointment, you will be taken to a preparation room within the Radiology Department.  A technologist or Nurse will obtain your medical history,  verify that you are correctly prepped for the exam, and explain the procedure.  Afterwards,  an IV will be started in your arm and electrodes will be placed on your skin for EKG monitoring during the stress portion of the exam. Then you will be escorted to the PET/CT scanner.  There, staff will get you positioned on the scanner and obtain a blood pressure and EKG.  During the exam, you will continue to be connected to the EKG and blood pressure machines.  A small, safe amount of a radioactive tracer will be injected in your IV to obtain a series of pictures of your heart along with an injection of a stress agent.    After your Exam:  It is recommended that you eat a meal and drink a caffeinated beverage to counter act any effects of the stress agent.  Drink plenty of fluids for the remainder of the day and urinate frequently for the first couple of hours after the exam.  Your doctor will inform you of your test results within 7-10 business days.  For questions about your test or how to prepare for your test, please call: Rockwell Alexandria, Cardiac Imaging Nurse Navigator  Larey Brick, Cardiac Imaging Nurse Navigator Office: [phone]    Follow-Up:  6 MONTHS WITH KATIE THOMPSON PA-C FOR STRUCTURAL (S/P TAVR) FOLLOW-UP    Signed, Meriam Sprague, MD  08/20/2022 5:06 PM  [facility]

## 2022-08-20 ENCOUNTER — Encounter: Payer: Self-pay | Admitting: Cardiology

## 2022-08-20 ENCOUNTER — Ambulatory Visit: Payer: Medicare Other | Attending: Cardiology | Admitting: Cardiology

## 2022-08-20 VITALS — BP 154/76 | HR 99 | Ht 63.0 in | Wt 129.0 lb

## 2022-08-20 DIAGNOSIS — Z952 Presence of prosthetic heart valve: Secondary | ICD-10-CM | POA: Diagnosis not present

## 2022-08-20 DIAGNOSIS — I25118 Atherosclerotic heart disease of native coronary artery with other forms of angina pectoris: Secondary | ICD-10-CM | POA: Diagnosis not present

## 2022-08-20 DIAGNOSIS — R072 Precordial pain: Secondary | ICD-10-CM | POA: Diagnosis not present

## 2022-08-20 DIAGNOSIS — G453 Amaurosis fugax: Secondary | ICD-10-CM

## 2022-08-20 DIAGNOSIS — I471 Supraventricular tachycardia, unspecified: Secondary | ICD-10-CM

## 2022-08-20 DIAGNOSIS — I350 Nonrheumatic aortic (valve) stenosis: Secondary | ICD-10-CM

## 2022-08-20 DIAGNOSIS — I10 Essential (primary) hypertension: Secondary | ICD-10-CM | POA: Diagnosis not present

## 2022-08-20 DIAGNOSIS — I050 Rheumatic mitral stenosis: Secondary | ICD-10-CM

## 2022-08-20 MED ORDER — FUROSEMIDE 20 MG PO TABS: 20.0000 mg | ORAL_TABLET | Freq: Every day | ORAL | 3 refills | Status: AC

## 2022-08-20 MED ORDER — CARVEDILOL 12.5 MG PO TABS: 12.5000 mg | ORAL_TABLET | Freq: Two times a day (BID) | ORAL | 2 refills | Status: DC

## 2022-08-20 NOTE — Patient Instructions (Signed)
Medication Instructions:   INCREASE YOUR CARVEDILOL TO 12.5 MG BY MOUTH TWICE DAILY  *If you need a refill on your cardiac medications before your next appointment, please call your pharmacy*    Testing/Procedures:  Your physician has requested that you have an echocardiogram. Echocardiography is a painless test that uses sound waves to create images of your heart. It provides your doctor with information about the size and shape of your heart and how well your heart's chambers and valves are working. This procedure takes approximately one hour. There are no restrictions for this procedure.  SCHEDULE ECHO TO BE DONE IN AUGUST 2024 PER DR. Shari Prows   Please do NOT wear cologne, perfume, aftershave, or lotions (deodorant is allowed). Please arrive 15 minutes prior to your appointment time.     How to Prepare for Your Cardiac PET/CT Stress Test:  1. Please do not take these medications before your test:   Medications that may interfere with the cardiac pharmacological stress agent (ex. nitrates - including erectile dysfunction medications, isosorbide mononitrate, tamulosin or beta-blockers) the day of the exam. (Erectile dysfunction medication should be held for at least 72 hrs prior to test)  DO NOT TAKE NITROGLYCERIN, CARVEDILOL THE DAY OF THIS EXAM  Your remaining medications may be taken with water.  2. Nothing to eat or drink, except water, 3 hours prior to arrival time.   NO caffeine/decaffeinated products, or chocolate 12 hours prior to arrival.  3. NO perfume, cologne or lotion  4. Total time is 1 to 2 hours; you may want to bring reading material for the waiting time.  5. Please report to Radiology at the [facility] Main Entrance 30 minutes early for your test.  [address] [place], [place] [address]  Diabetic Preparation:  Hold oral medications.  HOLD METFORMIN THE MORNING OF THIS EXAM You may take NPH and Lantus insulin. Do not take Humalog or Humulin  R (Regular Insulin) the day of your test. Check blood sugars prior to leaving the house. If able to eat breakfast prior to 3 hour fasting, you may take all medications, including your insulin, Do not worry if you miss your breakfast dose of insulin - start at your next meal.    In preparation for your appointment, medication and supplies will be purchased.  Appointment availability is limited, so if you need to cancel or reschedule, please call the Radiology Department at [phone]  24 hours in advance to avoid a cancellation fee of $100.00  What to Expect After you Arrive:  Once you arrive and check in for your appointment, you will be taken to a preparation room within the Radiology Department.  A technologist or Nurse will obtain your medical history, verify that you are correctly prepped for the exam, and explain the procedure.  Afterwards,  an IV will be started in your arm and electrodes will be placed on your skin for EKG monitoring during the stress portion of the exam. Then you will be escorted to the PET/CT scanner.  There, staff will get you positioned on the scanner and obtain a blood pressure and EKG.  During the exam, you will continue to be connected to the EKG and blood pressure machines.  A small, safe amount of a radioactive tracer will be injected in your IV to obtain a series of pictures of your heart along with an injection of a stress agent.    After your Exam:  It is recommended that you eat a meal and  drink a caffeinated beverage to counter act any effects of the stress agent.  Drink plenty of fluids for the remainder of the day and urinate frequently for the first couple of hours after the exam.  Your doctor will inform you of your test results within 7-10 business days.  For questions about your test or how to prepare for your test, please call: Rockwell Alexandria, Cardiac Imaging Nurse Navigator  Larey Brick, Cardiac Imaging Nurse Navigator Office:  [phone]    Follow-Up:  6 MONTHS WITH KATIE THOMPSON PA-C FOR STRUCTURAL (S/P TAVR) FOLLOW-UP

## 2022-09-08 ENCOUNTER — Telehealth: Payer: Self-pay | Admitting: Pulmonary Disease

## 2022-09-09 ENCOUNTER — Ambulatory Visit: Payer: Medicare Other | Admitting: Podiatry

## 2022-09-10 ENCOUNTER — Other Ambulatory Visit: Payer: Self-pay

## 2022-09-10 MED ORDER — BUDESONIDE-FORMOTEROL FUMARATE 80-4.5 MCG/ACT IN AERO: 2.0000 | INHALATION_SPRAY | Freq: Two times a day (BID) | RESPIRATORY_TRACT | 11 refills | Status: DC | PRN

## 2022-09-10 NOTE — Telephone Encounter (Signed)
Pt calling in to get a prescription sent in for her Symbicort

## 2022-09-10 NOTE — Telephone Encounter (Signed)
Symbicort has been sent to pharmacy. NFN

## 2022-09-22 ENCOUNTER — Ambulatory Visit (HOSPITAL_BASED_OUTPATIENT_CLINIC_OR_DEPARTMENT_OTHER): Payer: Medicare Other | Admitting: Pulmonary Disease

## 2022-09-22 ENCOUNTER — Encounter (HOSPITAL_BASED_OUTPATIENT_CLINIC_OR_DEPARTMENT_OTHER): Payer: Self-pay | Admitting: Pulmonary Disease

## 2022-09-22 VITALS — BP 140/62 | HR 91 | Temp 98.4°F | Ht 63.0 in | Wt 124.4 lb

## 2022-09-22 DIAGNOSIS — J4540 Moderate persistent asthma, uncomplicated: Secondary | ICD-10-CM | POA: Diagnosis not present

## 2022-09-22 NOTE — Patient Instructions (Signed)
X refills on symbicort as needed

## 2022-09-22 NOTE — Progress Notes (Signed)
   Subjective:    Patient ID: Cheryl Hernandez, female    DOB: [birth year], 82 y.o.   MRN: [id number]  HPI  82 yo remote smoker  for follow-up of moderate persistent asthma and chronic allergic rhinitis PMH - TAVR 2021  Annual follow-up visit  She states that pollen season was rough.  She is compliant with Symbicort 80.  She had to use albuterol occasionally for breakthrough.  She reports a lot of "congestion" and takes Mucinex on an as-needed basis. She did not need oral steroids. She is compliant with Lasix and watches the salt in her diet She has been found to have moderate MR and severe mitral annular calcification on echo 10/2021.  Did not have a follow-up echo yet in 2024 She reports some hoarseness of voice  Significant tests/ events reviewed   CT angiogram chest 10/2019 no evidence of ILD   PFT 08/2015: FVC 2.24 L (85%) FEV1 1.82 L (93%) ratio 81  no bronchodilator response TLC 4.49 L (91%) RV 90% ERV 27% DLCO uncorrected 81% (hemoglobin 13.6)     11/2014: FVC 3.52 L (130%)  FEV1 2.81 L (139%) FEV1/FVC 0.80    09/13/10: FVC 2.49 L (87%) FEV1 1.92 L (89%) FEV1/FVC 0.77   Review of Systems neg for any significant sore throat, dysphagia, itching, sneezing, nasal congestion or excess/ purulent secretions, fever, chills, sweats, unintended wt loss, pleuritic or exertional cp, hempoptysis, orthopnea pnd or change in chronic leg swelling. Also denies presyncope, palpitations, heartburn, abdominal pain, nausea, vomiting, diarrhea or change in bowel or urinary habits, dysuria,hematuria, rash, arthralgias, visual complaints, headache, numbness weakness or ataxia.     Objective:   Physical Exam  Gen. Pleasant, elderly,well-nourished, in no distress ENT - no thrush, no pallor/icterus,no post nasal drip Neck: No JVD, no thyromegaly, no carotid bruits Lungs: no use of accessory muscles, no dullness to percussion, clear without rales or rhonchi  Cardiovascular: Rhythm regular, heart  sounds  normal, no murmurs or gallops, no peripheral edema Musculoskeletal: No deformities, no cyanosis or clubbing        Assessment & Plan:

## 2022-09-22 NOTE — Assessment & Plan Note (Signed)
Continue Symbicort 80-2 puffs twice daily. We discussed hoarseness as a possible side effect of this medication but clearly benefit outweighs risk of side effects. We discussed using Mucinex for congestion and albuterol for rescue. We discussed plan for asthma exacerbation and signs and symptoms of the same differentiated from congestive heart failure related to mitral regurgitation

## 2022-10-06 ENCOUNTER — Telehealth: Payer: Self-pay | Admitting: Cardiology

## 2022-10-06 NOTE — Telephone Encounter (Signed)
Patient is calling about a CT Dr. Shari Prows wants her to have done she would like to speak to [facility] about it.

## 2022-10-06 NOTE — Telephone Encounter (Signed)
Pt was calling in to follow-up on when her Cardiac PET SCAN will be scheduled, being this was ordered about a month ago.   Informed the pt that as discussed at last OV, it could take several weeks to months before she will get a call back to schedule this test.  Informed her this is standard timeline with scheduling this type test.   Informed her that I will be glad to send this message to our CT Nurse Navigator and [facility] PET Scheduling team to inquire about this if she'd like.   Pt verbalized understanding and agrees with this plan.

## 2022-10-06 NOTE — Telephone Encounter (Signed)
Derrell Lolling" - 10/06/2022 12:49 PM Lennie Odor, RN  Sent: Sheral Flow October 06, 2022  2:11 PM  To: Loa Socks, LPN; Francine Graven  Cc: [facility] Cardiac Pet         Message  Will send this to Anisah :)  [place]

## 2022-10-13 ENCOUNTER — Encounter (HOSPITAL_COMMUNITY): Payer: Self-pay

## 2022-10-13 ENCOUNTER — Telehealth (HOSPITAL_COMMUNITY): Payer: Self-pay | Admitting: *Deleted

## 2022-10-13 NOTE — Telephone Encounter (Signed)
Reaching out to patient to offer assistance regarding upcoming cardiac imaging study; pt verbalizes understanding of appt date/time, parking situation and where to check in, pre-test NPO status  and verified current allergies; name and call back number provided for further questions should they arise  Merle Prescott RN Navigator Cardiac Imaging [facility] [phone] office [phone] cell  Patient aware to avoid caffeine 12 hours prior to her cardiac PET scan. 

## 2022-10-14 ENCOUNTER — Encounter (HOSPITAL_COMMUNITY)
Admission: RE | Admit: 2022-10-14 | Discharge: 2022-10-14 | Disposition: A | Discharge status: Home / Self Care | Payer: Medicare Other | Source: Ambulatory Visit | Attending: Cardiology | Admitting: Cardiology

## 2022-10-14 DIAGNOSIS — R072 Precordial pain: Secondary | ICD-10-CM | POA: Diagnosis not present

## 2022-10-14 DIAGNOSIS — I25118 Atherosclerotic heart disease of native coronary artery with other forms of angina pectoris: Secondary | ICD-10-CM | POA: Insufficient documentation

## 2022-10-14 DIAGNOSIS — Z952 Presence of prosthetic heart valve: Secondary | ICD-10-CM | POA: Diagnosis not present

## 2022-10-14 LAB — NM PET CT CARDIAC PERFUSION MULTI W/ABSOLUTE BLOODFLOW
LV dias vol: 58 mL (ref 46–106)
LV sys vol: 16 mL
MBFR: 2.46
Nuc Rest EF: 72 %
Nuc Stress EF: 72 %
Rest MBF: 1.25 ml/g/min
Rest Nuclear Isotope Dose: 14.7 mCi
ST Depression (mm): 0 mm
Stress MBF: 3.07 ml/g/min
Stress Nuclear Isotope Dose: 14.7 mCi

## 2022-10-14 MED ORDER — REGADENOSON 0.4 MG/5ML IV SOLN
INTRAVENOUS | Status: AC
  Filled 2022-10-14: qty 5

## 2022-10-14 MED ORDER — RUBIDIUM RB82 GENERATOR (RUBYFILL)
14.6600 | PACK | Freq: Once | INTRAVENOUS | Status: AC
  Administered 2022-10-14: 14.66 via INTRAVENOUS

## 2022-10-14 MED ORDER — RUBIDIUM RB82 GENERATOR (RUBYFILL)
14.7300 | PACK | Freq: Once | INTRAVENOUS | Status: AC
  Administered 2022-10-14: 14.73 via INTRAVENOUS

## 2022-10-14 MED ORDER — REGADENOSON 0.4 MG/5ML IV SOLN
0.4000 mg | Freq: Once | INTRAVENOUS | Status: AC
  Administered 2022-10-14: 0.4 mg via INTRAVENOUS

## 2022-10-14 NOTE — Progress Notes (Signed)
Tolerated test well 

## 2022-10-30 ENCOUNTER — Other Ambulatory Visit: Payer: Self-pay | Admitting: *Deleted

## 2022-10-30 DIAGNOSIS — I6523 Occlusion and stenosis of bilateral carotid arteries: Secondary | ICD-10-CM

## 2022-11-17 ENCOUNTER — Ambulatory Visit (HOSPITAL_COMMUNITY): Payer: Medicare Other | Attending: Cardiology

## 2022-11-17 DIAGNOSIS — Z952 Presence of prosthetic heart valve: Secondary | ICD-10-CM | POA: Insufficient documentation

## 2022-11-17 LAB — ECHOCARDIOGRAM COMPLETE
AR max vel: 1.43 cm2
AV Area VTI: 1.57 cm2
AV Area mean vel: 1.5 cm2
AV Mean grad: 10.3 mmHg
AV Peak grad: 20.4 mmHg
Ao pk vel: 2.26 m/s
Area-P 1/2: 3.36 cm2
MV VTI: 1.61 cm2
P 1/2 time: 346 msec
S' Lateral: 2.5 cm

## 2022-11-25 ENCOUNTER — Ambulatory Visit: Payer: Medicare Other | Admitting: Vascular Surgery

## 2022-11-25 ENCOUNTER — Ambulatory Visit (HOSPITAL_COMMUNITY): Payer: Medicare Other

## 2023-01-06 ENCOUNTER — Encounter (HOSPITAL_COMMUNITY): Payer: Medicare Other

## 2023-01-06 ENCOUNTER — Ambulatory Visit: Payer: Medicare Other | Admitting: Vascular Surgery

## 2023-01-16 ENCOUNTER — Ambulatory Visit: Payer: Medicare Other

## 2023-01-19 NOTE — Progress Notes (Unsigned)
[facility]   MULTIDISCIPLINARY HEART VALVE CLINIC                                     Cardiology Office Note:    Date:  01/21/2023   ID:  Cheryl Hernandez, DOB January 18, 1941, MRN [id number]  PCP:  Chilton Greathouse, MD  [facility] Cardiologist: Dr. Excell Seltzer (previously Katrinka Blazing and [place] Electrophysiologist:  None   Referring MD: Chilton Greathouse, MD   Follow up  History of Present Illness:    Cheryl Hernandez is a 82 y.o. 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    Patient has a history of NSTEMI s/p PCI to RCA in 2009. 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 TTE 10/07/19 revealed paradoxical severe low-flow low gradient aortic stenosis. S/p TAVR with a 26 mm Medtronic Evolut Pro + THV via the TF approach on 12/20/19 by Dr. Excell Seltzer and Cornelius Moras. Unfortunately, patient developed a persistent headache and blurry vision s/p TAVR. 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 She was seen by neurology and underwent full stroke work up. She was discharged on aspirin and plavix. Also had a Zio patch which showed no atrial fibrillation. In follow up she complained of persistent visual issues and hallucinations. She was referred to Dr. Jimmye Norman with neuro psych.  She was seen back in clinic on 08/30/20 with sx worrisome for amaurosis fugax. Of note these episodes occurred off plavix and she was put back on DAPT. She was referred to Dr. Ladona Ridgel for consideration of a loop and seen on 10/04/20. Plan was to repeat carotid dopplers prior to considering loop. This showed 1-39% RICA and 40-59% LICA  stenosis. She was seen by Dr. Darrick Penna who offered her arch aortogram carotid angiogram for further evaluation as this would be considering looking for ulcerated plaque. Other potential imaging would be a CT angiogram but this would be less diagnostic for ulcerated plaque. Ultimately, continued medical management was decided. She was seen by neuro on 10/19/20 who agreed for evaluating for afib. Loop recorder insertion was planned for 11/21/20 but pt cancelled due to not knowing how much it might cost. She is now wearing an apple watch that monitor for afib.  Was seen in clinic on 10/28/21 where she was stable from a CV standpoint. Had significant diastolic murmur on exam and follow up TTE showed EF 65-70%, G2DD, normal RV, moderate LAE, moderate MR, mild to moderate MS with mean gradient , normal functioning TAVR with mean gradient , DI 0.63, mild AI. She has also had ongoing issues with palpitations and medication intolerance to BBs. She has had ongoing shortness of breath. BNPs have always been in a normal range. Also had atypical chest pain and PET stress test 10/14/22 showed a small area of possible ischemia vs artifact but overall felt to be low risk. A new small 3 mm pulmonary nodule was also noted.   Today the patient presents to clinic for follow up. She is here alone. She consistently has palpitations and shortness of breath with exertion, like vacuuming. Occasionally gets some  chest tightness with working out in the yard. It eases off with rest. Has swelling in the legs about once a week for which she takes lasix. No orthopnea or PND. No unexplained weight gain. Occasionally dizzy but not syncope. Ears feel stopped up a lot. She does complain of fatigue. She also has balance issues. She had complete resolution of her hallucinations but they have resurfaced recently, last month. She reports that her life is just not worth living with the level of her symptoms currently and wonders if it is related  to her mitral valve.   Past Medical History:  Diagnosis Date   Arthritis    Asthma    CAD (coronary artery disease)    a. NSTEMI 2009 s/p DES to RCAx2.   Carotid artery disease (HCC)    a. Mild-moderate - followed by [facility].   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 Excell Seltzer and Dr. Cornelius Moras    Severe aortic stenosis     Past Surgical History:  Procedure Laterality Date   ANGIOPLASTY     CORONARY ANGIOPLASTY WITH STENT PLACEMENT     RIGHT/LEFT HEART CATH AND CORONARY ANGIOGRAPHY N/A 08/08/2019   Procedure: RIGHT/LEFT HEART CATH AND CORONARY ANGIOGRAPHY;  Surgeon: Lyn Records, MD;  Location: [facility] INVASIVE CV LAB;  Service: Cardiovascular;  Laterality: N/A;   TEE WITHOUT CARDIOVERSION N/A 12/20/2019   Procedure: TRANSESOPHAGEAL ECHOCARDIOGRAM (TEE);  Surgeon: Tonny Bollman, MD;  Location: [facility] INVASIVE CV LAB;  Service: Open Heart Surgery;  Laterality: N/A;   TONSILLECTOMY     TOTAL ABDOMINAL HYSTERECTOMY     TOTAL HIP ARTHROPLASTY     TRANSCATHETER AORTIC VALVE REPLACEMENT, TRANSFEMORAL N/A 12/20/2019   Procedure: TRANSCATHETER AORTIC VALVE REPLACEMENT, TRANSFEMORAL;  Surgeon: Tonny Bollman, MD;  Location: [facility] INVASIVE CV LAB;  Service: Open Heart Surgery;  Laterality: N/A;    Current Medications: Current Meds  Medication Sig   acetaminophen (TYLENOL) 500 MG tablet Take 500 mg by mouth every 6 (six) hours as needed (for arthritis pain.).    ACETYLCARNITINE HCL PO Take by mouth daily.   albuterol (VENTOLIN HFA) 108 (90 Base) MCG/ACT inhaler Inhale 2 puffs into the lungs every 6 (six) hours as needed for wheezing or shortness of breath.   Alpha-D-Galactosidase (BEANO PO) Take 1 tablet by mouth daily as needed (flatulence/abdominal bloating.).   ALPRAZolam (XANAX) 0.25 MG tablet Take 0.5 tablets by mouth as needed.   aspirin EC 81 MG tablet Take 81 mg by mouth every evening.   azithromycin (ZITHROMAX) 500 MG tablet Take 1 tablet (500 mg total) by mouth as directed. Take one tablet 1 hour before any dental work including cleanings.   B Complex-C (B-COMPLEX WITH VITAMIN C) tablet Take 1 tablet by mouth daily.    budesonide (ENTOCORT EC) 3 MG 24 hr capsule Take  2 capsules (6 mg total) by mouth 2 (two) times daily as needed (diarrhea).   budesonide-formoterol (SYMBICORT) 80-4.5 MCG/ACT inhaler Inhale 2 puffs into the lungs 2 (two) times daily as needed (congestion).   calcium carbonate (TUMS - DOSED IN MG ELEMENTAL CALCIUM) 500 MG chewable tablet Chew 1 tablet by mouth 3 (three) times daily as needed for heartburn.    carvedilol (COREG) 6.25 MG tablet Take 6.25 mg by mouth 2 (two) times daily with a meal.   Cholecalciferol (VITAMIN D3) 75 MCG (3000 UT) TABS Take 3,000 Units by mouth every other day.   clopidogrel (PLAVIX) 75 MG tablet Take 1 tablet (75 mg total) by mouth daily.   Coenzyme Q10 200 MG capsule Take 200 mg by mouth daily.   diltiazem (CARDIZEM) 30 MG tablet Take 1 tablet (30 mg total) by mouth 2 (two) times daily as needed (breakthrough palpitations).   docusate sodium (COLACE) 100 MG capsule 100 mg as needed.   Evolocumab (REPATHA SURECLICK) 140 MG/ML SOAJ INJECT 1 ML INTO THE SKIN ONCE EVERY 14 DAYS   fexofenadine (ALLEGRA) 180 MG tablet Take 180 mg by mouth daily.   furosemide (LASIX) 20 MG tablet Take 1 tablet (20 mg total) by mouth daily.   Glucose Blood (FREESTYLE LITE TEST VI) Once daily   guaifenesin (HUMIBID E) 400 MG  TABS tablet Take 400 mg by mouth daily.   KRILL OIL PO Take 1 capsule by mouth daily.   Magnesium Citrate 125 MG CAPS Take 125 mg by mouth daily.   metFORMIN (GLUCOPHAGE-XR) 500 MG 24 hr tablet Take 500 mg by mouth daily.   Misc Natural Products (LEG VEIN & CIRCULATION PO) Take by mouth daily.   Multiple Vitamins-Minerals (MULTIVITAMIN WITH MINERALS) tablet Take 1 tablet by mouth every other day. In the morning   nitroGLYCERIN (NITROSTAT) 0.4 MG SL tablet Place 1 tablet (0.4 mg total) under the tongue every 5 (five) minutes as needed for chest pain.   Probiotic Product (ALIGN) 4 MG CAPS Take 4 mg by mouth every other day.   SYMBICORT 80-4.5 MCG/ACT inhaler INHALE 2 PUFFS BY MOUTH TWICE A DAY AS NEEDED FOR CONGESTION   Turmeric 400 MG CAPS Take by mouth daily.   UNABLE TO FIND Med Name: saffron 15   [DISCONTINUED] carvedilol (COREG) 12.5 MG tablet Take 1 tablet (12.5 mg total) by mouth 2 (two) times daily. (Patient taking differently: Take 6.25 mg by mouth 2 (two) times daily.)      ROS:   Please see the history of present illness.    All other systems reviewed and are negative.  EKGs   EKG:  EKG is NOT ordered today.   Recent Labs: No results found for requested labs within last 365 days.  Recent Lipid Panel    Component Value Date/Time   CHOL 157 12/22/2019 0841   CHOL 150 07/04/2019 0749   TRIG 151 (H) 12/22/2019 0841   HDL 67 12/22/2019 0841   HDL 68 07/04/2019 0749   CHOLHDL 2.3 12/22/2019 0841   VLDL 30 12/22/2019 0841   LDLCALC 60 12/22/2019 0841   LDLCALC 64 07/04/2019 0749     Risk Assessment/Calculations:          Physical Exam:    VS:  BP 132/60   Pulse 83   Ht 5\' 3"  (1.6 m)   Wt 127 lb 3.2 oz (57.7 kg)   SpO2 96%   BMI 22.53 kg/m     Wt Readings from Last 3 Encounters:  01/21/23  127 lb 3.2 oz (57.7 kg)  09/22/22 124 lb 6.4 oz (56.4 kg)  08/20/22 129 lb (58.5 kg)     GEN: Well nourished, well developed in no acute distress NECK: No JVD; No  carotid bruits CARDIAC: RRR, soft systolic flow murmur @ RUSB and 2/6 diastolic murmur heard best at LLSB. No rubs, gallops RESPIRATORY:  Clear to auscultation without rales, wheezing or rhonchi  ABDOMEN: Soft, non-tender, non-distended EXTREMITIES:  No edema; No deformity   ASSESSMENT:    1. SOB (shortness of breath)   2. S/P TAVR (transcatheter aortic valve replacement)   3. Amaurosis fugax of left eye   4. SVT (supraventricular tachycardia) (HCC)   5. Mitral valve disease   6. History of CVA (cerebrovascular accident)   7. Bilateral carotid artery stenosis   8. Coronary artery disease of native artery of native heart with stable angina pectoris (HCC)   9. Essential hypertension   10. Lower extremity edema   11. Pulmonary nodule    PLAN:    In order of problems listed above:   SOB/chest tightness: this has been ongoing and life limiting for her. Recent echo showed stable TAVR with moderate MAC with moderate MS/MR: mean gradient of 8 mm hg. Escalation of beta blocker therapy has been difficult given intolerance to therapy. 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 Recent NM PET showed a small area of possible ischemia but overall felt to be low risk. She does not appear overloaded and previous BNPs have always been in the normal range. I will reach out to Dr. Excell Seltzer to see if he thinks her mitral valve or coronary disease could be playing a role in her symptoms.   Severe, paradoxical AS s/p TAVR 12/20/19: recent echo 11/17/22 showed EF 65%, normally functioning TAVR with a mean gradient of 12 mmHg and trivial PVL. Echo will be followed on an annual basis. Continue SBE prophylaxis. Continue chronic DAPT. Since Dr. Excell Seltzer did her TAVR and she does not have a cardiologist, she will be assigned to him.   Concern for Amaurosis Fugax: No recurrence.  Patient with 2 episodes in 2022 of the sensation of a "shade coming over her left eye" that lasted for several hours before resolving. Multiple monitors with no evidence of Afib, however, patient has frequent palpitations. Given concern for amaurosis fugax, we referred for loop but patient declined due to cost and monitoring with apple watch.  -Continue apple watch -Continue plavix 75mg  daily  -Continue ASA 81mg  daily -Continue repatha 140mg  q2weeks   #SVT: #Palpitations: Patient with 19 beat episode of SVT on monitor while at cardiac rehab. Has known history of SVT. Cardiac monitor 05/2021 with no episodes of Afib but some runs of SVT. Now monitoring with apple watch as detailed above. Now on coreg as did not tolerate metop due to fatigue -Currently taking coreg to 6.25mg  BID (previously increased but she reverted back to this dose). She occasionally takes 12.5mg  for elevated BPs. Has had hard time with fatigue in the past with different BBs and higher doses.  -Continue dilt 30mg  BID prn of palpitations   #Mild-to-moderate MS: #Moderate MR #Severe MAC: TTE 10/2022 with moderate MAC with moderate MS/MR: mean gradient of 8 mm hg.  -Continue coreg 6.25mg  BID (did not tolerate metop due to fatigue) -Serial TTEs for monitoring   #Post-Op CVA: Continue ASA 81mg  daily and plavix 75mg  daily as  above   #Carotid Artery Disease: -Mild on most recent dopplers on 11/19/21 at 1-39% bilaterally -Continue ASA 81mg  daily and plavix 75mg  daily as above -On repatha 140mg  q2weeks   #CAD s/p RCA PCI in the past: -Cath in 2021 with patent RCA stent and no significant obstructive disease. Recent NM PET 10/14/22 was overall felt to be low risk.  -Continue ASA 81mg  daily and plavix 75mg  daily as above -Continue coreg as above -Continue losartan 25mg  daily   #HTN: BP well controlled. No changes made.    #Mild LE edema: -Continue lasix 20mg  prn as needed  #Pulmonary nodule: recent PET scan showed a "new  small solid pulmonary nodule of the left upper lobe measuring 3 mm. No follow-up needed if patient is low-risk" Pt did smoke, will plan to repeat CT in 1 year (09/2023).    Medication Adjustments/Labs and Tests Ordered: Current medicines are reviewed at length with the patient today.  Concerns regarding medicines are outlined above.  No orders of the defined types were placed in this encounter.  Meds ordered this encounter  Medications   azithromycin (ZITHROMAX) 500 MG tablet    Sig: Take 1 tablet (500 mg total) by mouth as directed. Take one tablet 1 hour before any dental work including cleanings.    Dispense:  6 tablet    Refill:  12    Order Specific Question:   Supervising Provider    Answer:   Tonny Bollman [[id number]]    Patient Instructions  Medication Instructions:  Your physician recommends that you continue on your current medications as directed. Please refer to the Current Medication list given to you today.  *If you need a refill on your cardiac medications before your next appointment, please call your pharmacy*   Lab Work: None ordered   If you have labs (blood work) drawn today and your tests are completely normal, you will receive your results only by: MyChart Message (if you have MyChart) OR A paper copy in the mail If you have any lab test that is abnormal or we need to change your treatment, we will call you to review the results.   Testing/Procedures: None ordered    Follow-Up: Florentina Addison will call you with a follow up appointment   Other Instructions     Signed, Cline Crock, PA-C  01/21/2023 2:53 PM    [facility]

## 2023-01-21 ENCOUNTER — Ambulatory Visit: Payer: Medicare Other | Attending: Physician Assistant | Admitting: Physician Assistant

## 2023-01-21 ENCOUNTER — Ambulatory Visit: Payer: Medicare Other | Admitting: Physician Assistant

## 2023-01-21 VITALS — BP 132/60 | HR 83 | Ht 63.0 in | Wt 127.2 lb

## 2023-01-21 DIAGNOSIS — I10 Essential (primary) hypertension: Secondary | ICD-10-CM

## 2023-01-21 DIAGNOSIS — Z8673 Personal history of transient ischemic attack (TIA), and cerebral infarction without residual deficits: Secondary | ICD-10-CM

## 2023-01-21 DIAGNOSIS — I471 Supraventricular tachycardia, unspecified: Secondary | ICD-10-CM | POA: Diagnosis not present

## 2023-01-21 DIAGNOSIS — R911 Solitary pulmonary nodule: Secondary | ICD-10-CM

## 2023-01-21 DIAGNOSIS — Z952 Presence of prosthetic heart valve: Secondary | ICD-10-CM | POA: Diagnosis not present

## 2023-01-21 DIAGNOSIS — R0602 Shortness of breath: Secondary | ICD-10-CM

## 2023-01-21 DIAGNOSIS — G453 Amaurosis fugax: Secondary | ICD-10-CM

## 2023-01-21 DIAGNOSIS — I25118 Atherosclerotic heart disease of native coronary artery with other forms of angina pectoris: Secondary | ICD-10-CM

## 2023-01-21 DIAGNOSIS — I059 Rheumatic mitral valve disease, unspecified: Secondary | ICD-10-CM

## 2023-01-21 DIAGNOSIS — I6523 Occlusion and stenosis of bilateral carotid arteries: Secondary | ICD-10-CM

## 2023-01-21 DIAGNOSIS — R600 Localized edema: Secondary | ICD-10-CM

## 2023-01-21 MED ORDER — AZITHROMYCIN 500 MG PO TABS: 500.0000 mg | ORAL_TABLET | ORAL | 12 refills | Status: AC

## 2023-01-21 NOTE — Patient Instructions (Signed)
Medication Instructions:  Your physician recommends that you continue on your current medications as directed. Please refer to the Current Medication list given to you today.  *If you need a refill on your cardiac medications before your next appointment, please call your pharmacy*   Lab Work: None ordered   If you have labs (blood work) drawn today and your tests are completely normal, you will receive your results only by: MyChart Message (if you have MyChart) OR A paper copy in the mail If you have any lab test that is abnormal or we need to change your treatment, we will call you to review the results.   Testing/Procedures: None ordered    Follow-Up: Florentina Addison will call you with a follow up appointment   Other Instructions

## 2023-01-23 ENCOUNTER — Ambulatory Visit: Payer: Medicare Other

## 2023-01-30 ENCOUNTER — Ambulatory Visit: Payer: Medicare Other

## 2023-02-04 ENCOUNTER — Encounter: Payer: Self-pay | Admitting: Cardiovascular Disease

## 2023-02-04 ENCOUNTER — Ambulatory Visit: Payer: Medicare Other | Attending: Cardiovascular Disease | Admitting: Cardiovascular Disease

## 2023-02-04 VITALS — BP 158/60 | HR 86 | Ht 63.0 in | Wt 127.6 lb

## 2023-02-04 DIAGNOSIS — Z952 Presence of prosthetic heart valve: Secondary | ICD-10-CM

## 2023-02-04 DIAGNOSIS — I10 Essential (primary) hypertension: Secondary | ICD-10-CM

## 2023-02-04 DIAGNOSIS — I050 Rheumatic mitral stenosis: Secondary | ICD-10-CM

## 2023-02-04 DIAGNOSIS — I25119 Atherosclerotic heart disease of native coronary artery with unspecified angina pectoris: Secondary | ICD-10-CM

## 2023-02-04 NOTE — Assessment & Plan Note (Signed)
Normal transvalvular gradients noted.  Mild paravalvular regurgitation is audible on her exam.  Continue current management.  SBE prophylaxis is indicated lifelong.

## 2023-02-04 NOTE — Patient Instructions (Signed)
Testing/Procedures: ECHO (in 1 year prior to appt) Your physician has requested that you have an echocardiogram. Echocardiography is a painless test that uses sound waves to create images of your heart. It provides your doctor with information about the size and shape of your heart and how well your heart's chambers and valves are working. This procedure takes approximately one hour. There are no restrictions for this procedure. Please do NOT wear cologne, perfume, aftershave, or lotions (deodorant is allowed). Please arrive 15 minutes prior to your appointment time.  Please note: We ask at that you not bring children with you during ultrasound (echo/ vascular) testing. Due to room size and safety concerns, children are not allowed in the ultrasound rooms during exams. Our front office staff cannot provide observation of children in our lobby area while testing is being conducted. An adult accompanying a patient to their appointment will only be allowed in the ultrasound room at the discretion of the ultrasound technician under special circumstances. We apologize for any inconvenience.  Follow-Up: At [facility], you and your health needs are our priority.  As part of our continuing mission to provide you with exceptional heart care, we have created designated Provider Care Teams.  These Care Teams include your primary Cardiologist (physician) and Advanced Practice Providers (APPs -  Physician Assistants and Nurse Practitioners) who all work together to provide you with the care you need, when you need it.  Your next appointment:   1 year(s)  Provider:   Tonny Bollman, MD

## 2023-02-04 NOTE — Assessment & Plan Note (Signed)
No anginal symptoms on current medical regimen.  Will continue aspirin for antiplatelet therapy, clopidogrel, carvedilol and diltiazem, and Repatha.  Patient is statin intolerant.

## 2023-02-04 NOTE — Progress Notes (Signed)
Cardiology Office Note:    Date:  02/04/2023   ID:  Cheryl Hernandez, DOB April 26, 1940, MRN [id number]  PCP:  Chilton Greathouse, MD   [facility] Providers Cardiologist:  Tonny Bollman, MD     Referring MD: Chilton Greathouse, MD   Chief Complaint  Patient presents with   Shortness of Breath    History of Present Illness:    Cheryl Hernandez is a 82 y.o. female presenting for follow-up of aortic valve disease and chronic dyspnea.  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  The patient has been concerned about the presence of mitral stenosis and wanted to come in for discussion of potential treatment options.  She is limited by exertional dyspnea with any moderate level activities.  She is able to do her household chores and normal activities without any limitation.  However, with any significant walking or moderate activity she feels short of breath.  She denies orthopnea, PND, or leg swelling.  She denies chest pain or pressure.   Current Medications: Current Meds  Medication Sig   acetaminophen (TYLENOL) 500 MG tablet Take 500 mg by mouth every 6 (six) hours as needed (for arthritis pain.).   ACETYLCARNITINE HCL PO Take by mouth daily.   albuterol (VENTOLIN HFA) 108 (90 Base) MCG/ACT inhaler Inhale 2 puffs into the lungs every 6 (six) hours as needed for wheezing or shortness of breath.   Alpha-D-Galactosidase (BEANO PO) Take 1 tablet by mouth daily as needed (flatulence/abdominal bloating.).   aspirin EC 81 MG tablet Take 81 mg by mouth every evening.   azithromycin (ZITHROMAX) 500 MG tablet Take 1 tablet (500 mg total) by mouth as directed. Take one tablet 1 hour before any dental work including cleanings.   B Complex-C (B-COMPLEX WITH  VITAMIN C) tablet Take 1 tablet by mouth daily.    budesonide (ENTOCORT EC) 3 MG 24 hr capsule Take 2 capsules (6 mg total) by mouth 2 (two) times daily as needed (diarrhea).   budesonide-formoterol (SYMBICORT) 80-4.5 MCG/ACT inhaler Inhale 2 puffs into the lungs 2 (two) times daily as needed (congestion).   calcium carbonate (TUMS - DOSED IN MG ELEMENTAL CALCIUM) 500 MG chewable tablet Chew 1 tablet by mouth 3 (three) times daily as needed for heartburn.    carvedilol (COREG) 6.25 MG tablet Take 6.25 mg by mouth 2 (two) times daily with a meal.   Cholecalciferol (VITAMIN D3) 75 MCG (3000 UT) TABS Take 3,000 Units by mouth every other day.   clopidogrel (PLAVIX) 75 MG tablet Take 1 tablet (75 mg total) by mouth daily.   Coenzyme Q10 200 MG capsule Take 200 mg by mouth daily.   diltiazem (CARDIZEM) 30 MG tablet Take 1 tablet (30 mg total) by mouth 2 (two) times daily as needed (breakthrough palpitations).   docusate sodium (COLACE) 100 MG capsule 100 mg as needed.   Evolocumab (REPATHA SURECLICK) 140 MG/ML SOAJ INJECT 1 ML INTO THE SKIN ONCE EVERY 14 DAYS   fexofenadine (ALLEGRA) 180 MG tablet Take 180 mg by mouth daily.   furosemide (LASIX) 20 MG tablet Take 1 tablet (20 mg total) by mouth daily.   Glucose Blood (FREESTYLE LITE TEST VI) Once daily   guaifenesin (HUMIBID E) 400 MG TABS tablet Take 400 mg by mouth daily.   KRILL OIL PO Take 1  capsule by mouth daily.   Magnesium Citrate 125 MG CAPS Take 125 mg by mouth daily.   metFORMIN (GLUCOPHAGE-XR) 500 MG 24 hr tablet Take 500 mg by mouth daily.   Misc Natural Products (LEG VEIN & CIRCULATION PO) Take by mouth daily.   Multiple Vitamins-Minerals (MULTIVITAMIN WITH MINERALS) tablet Take 1 tablet by mouth every other day. In the morning   nitroGLYCERIN (NITROSTAT) 0.4 MG SL tablet Place 1 tablet (0.4 mg total) under the tongue every 5 (five) minutes as needed for chest pain.   Probiotic Product (ALIGN) 4 MG CAPS Take 4 mg by mouth every other  day.   SYMBICORT 80-4.5 MCG/ACT inhaler INHALE 2 PUFFS BY MOUTH TWICE A DAY AS NEEDED FOR CONGESTION   Turmeric 400 MG CAPS Take by mouth daily.   UNABLE TO FIND Med Name: saffron 15     Allergies:   Amlodipine, Budesonide-formoterol fumarate, Cefuroxime axetil, Cephalexin, Codeine, Doxycycline, Erythromycin base, Linaclotide, Statins, Telmisartan, and Tiotropium bromide monohydrate   ROS:   Please see the history of present illness.    All other systems reviewed and are negative.  EKGs/Labs/Other Studies Reviewed:    The following studies were reviewed today: Cardiac Studies & Procedures   CARDIAC CATHETERIZATION  CARDIAC CATHETERIZATION 08/08/2019  Narrative  Moderate aortic stenosis with peak to peak gradient of 33 mmHg, mean gradient 29 mmHg, and calculated aortic valve area of 0.81 cm.  Normal pulmonary pressures with mean arterial pressure of 23 mmHg.  3 mmHg gradient across the mitral valve  Normal left ventricular systolic function with EF 65%.  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  Luminal irregularities are noted throughout the LAD territory.  Luminal irregularities noted throughout the circumflex.  The RCA is dominant, tortuous, and contains a patent mid to distal stent beyond several acute bends.  Diffuse luminal irregularities are noted otherwise.  Scattered significant calcification is noted in the coronary arterial tree, the thoracic aorta, and mitral annulus.  RECOMMENDATIONS:   There is discordance between aortic stenosis and coronary disease severity and symptoms.  Dyspnea on exertion may be multifactorial.  LAD aneurysm is stable.  Consider referral to aortic valve clinic for an opinion.  May need pulmonary evaluation.  May need transesophageal echo to exclude the possibility of mitral valve disease being more significant than suggested by  current data.  Findings Coronary Findings Diagnostic  Dominance: Right  Left Anterior Descending The vessel exhibits minimal luminal irregularities. Non-stenotic Ost LAD lesion. Prox LAD to Mid LAD lesion is 40% stenosed.  Left Circumflex There is mild diffuse disease throughout the vessel.  First Obtuse Marginal Branch  Right Coronary Artery  Intervention  No interventions have been documented.   STRESS TESTS  NM PET CT CARDIAC PERFUSION MULTI W/ABSOLUTE BLOODFLOW 10/14/2022  Narrative   LV perfusion is abnormal. There is evidence of ischemia. There is no evidence of infarction. Defect 1: There is a medium defect with mild reduction in uptake present in the apical apex location(s) that is reversible. There is normal wall motion in the defect area. Consistent with ischemia.   Rest left ventricular function is normal. Rest EF: 72%. Stress left ventricular function is normal. Stress EF: 72%. End diastolic cavity size is normal. End systolic cavity size is normal.   Myocardial blood flow was computed to be 1.69ml/g/min at rest and 3.86ml/g/min at stress. Global myocardial blood flow reserve was 2.46 and was normal. In setting of  prior RCA stent   Coronary calcium was present on the attenuation correction CT images. Severe coronary calcifications were present. Coronary calcifications were present in the left anterior descending artery, left circumflex artery and right coronary artery distribution(s).   Findings are consistent with ischemia. The study is low risk.   Small area of apical ischemia with normal MBFR and no RWMA in this area   Medtronic Evolut valve noted in aortic position  CLINICAL DATA:  This over-read does not include interpretation of cardiac or coronary anatomy or pathology. The Cardiac PET CT interpretation by the cardiologist is attached.  COMPARISON:  None Available.  FINDINGS: Vascular: Normal heart size. No pericardial effusion. Prior transcatheter aortic  valve replacement. Normal caliber thoracic aorta with severe calcified plaque. Severe coronary artery calcifications. Mitral annular calcifications.  Mediastinum/Nodes: Small hiatal hernia. No pathologically enlarged lymph nodes seen in the chest.  Lungs/Pleura: Central airways are patent. Mild linear opacity of the right middle lobe, likely due to scarring or atelectasis. No consolidation, pleural effusion or pneumothorax. New small solid pulmonary nodule of the left upper lobe measuring 3 mm on series 4, image 36.  Upper Abdomen: No acute abnormality  Musculoskeletal: No aggressive appearing osseous lesions.  IMPRESSION: 1. No acute extracardiac abnormality. 2. New small solid pulmonary nodule of the left upper lobe measuring 3 mm. No follow-up needed if patient is low-risk.This recommendation follows the consensus statement: Guidelines for Management of Incidental Pulmonary Nodules Detected on CT Images: From the Fleischner Society 2017; Radiology 2017; 284:228-243. 3. Aortic Atherosclerosis (ICD10-I70.0).   Electronically Signed By: Allegra Lai M.D. On: 10/14/2022 10:46   ECHOCARDIOGRAM  ECHOCARDIOGRAM COMPLETE 11/17/2022  Narrative ECHOCARDIOGRAM REPORT    Patient Name:   Cheryl Hernandez Date of Exam: 11/17/2022 Medical Rec #:  [id number]         Height:       63.0 in Accession #:    [id number]        Weight:       124.4 lb Date of Birth:  15-Jun-1940         BSA:          1.580 m Patient Age:    82 years          BP:           154/76 mmHg Patient Gender: F                 HR:           85 bpm. Exam Location:  [address]  Procedure: 2D Echo, Cardiac Doppler and Color Doppler  Indications:    Z95.2 S/p TAVR  History:        Patient has prior history of Echocardiogram examinations, most recent 09/02/2021. CAD, S/p TAVR and Mitral Stenosis, Arrythmias:SVT; Risk Factors:Hypertension. Aortic Valve: 26 mm Medtronic CoreValve-Evolut Pro prosthetic, stented  (TAVR) valve is present in the aortic position.  Sonographer:    Samule Ohm RDCS Referring Phys: [id number] [facility] [place]  IMPRESSIONS   1. Left ventricular ejection fraction, by estimation, is 65 to 70%. The left ventricle has normal function. The left ventricle has no regional wall motion abnormalities. There is mild asymmetric left ventricular hypertrophy of the basal-septal segment. Left ventricular diastolic parameters are consistent with Grade II diastolic dysfunction (pseudonormalization). Elevated left atrial pressure. 2. Right ventricular systolic function is normal. The right ventricular size is normal. There is mildly elevated pulmonary artery systolic pressure. The estimated right ventricular systolic pressure is 40.0 mmHg.  3. Left atrial size was mildly dilated. 4. The mitral valve is degenerative. Moderate mitral valve regurgitation. Moderate mitral stenosis. The mean mitral valve gradient is 8.0 mmHg with average heart rate of 80 bpm. Severe mitral annular calcification. 5. The aortic valve has been repaired/replaced. Aortic valve regurgitation is trivial. There is a 26 mm Medtronic CoreValve-Evolut Pro prosthetic (TAVR) valve present in the aortic position. Vmax 2.3 m/s, MG 12 mmHg, EOA 1.5 cm^2, DI 0.57. Echo findings are consistent with normal structure and function of the aortic valve prosthesis. 6. The inferior vena cava is normal in size with greater than 50% respiratory variability, suggesting right atrial pressure of 3 mmHg.  FINDINGS Left Ventricle: Left ventricular ejection fraction, by estimation, is 65 to 70%. The left ventricle has normal function. The left ventricle has no regional wall motion abnormalities. The left ventricular internal cavity size was normal in size. There is mild asymmetric left ventricular hypertrophy of the basal-septal segment. Left ventricular diastolic parameters are consistent with Grade II diastolic dysfunction (pseudonormalization).  Elevated left atrial pressure.  Right Ventricle: The right ventricular size is normal. No increase in right ventricular wall thickness. Right ventricular systolic function is normal. There is mildly elevated pulmonary artery systolic pressure. The tricuspid regurgitant velocity is 3.04 m/s, and with an assumed right atrial pressure of 3 mmHg, the estimated right ventricular systolic pressure is 40.0 mmHg.  Left Atrium: Left atrial size was mildly dilated.  Right Atrium: Right atrial size was normal in size.  Pericardium: There is no evidence of pericardial effusion.  Mitral Valve: The mitral valve is degenerative in appearance. Severe mitral annular calcification. Moderate mitral valve regurgitation. Moderate mitral valve stenosis. MV peak gradient, 20.8 mmHg. The mean mitral valve gradient is 8.0 mmHg with average heart rate of 80 bpm.  Tricuspid Valve: The tricuspid valve is normal in structure. Tricuspid valve regurgitation is trivial.  Aortic Valve: The aortic valve has been repaired/replaced. Aortic valve regurgitation is trivial. Aortic regurgitation PHT measures 346 msec. Aortic valve mean gradient measures 10.3 mmHg. Aortic valve peak gradient measures 20.4 mmHg. Aortic valve area, by VTI measures 1.57 cm. There is a 26 mm Medtronic CoreValve-Evolut Pro prosthetic, stented (TAVR) valve present in the aortic position. Echo findings are consistent with normal structure and function of the aortic valve prosthesis.  Pulmonic Valve: The pulmonic valve was not well visualized. Pulmonic valve regurgitation is not visualized.  Aorta: The aortic root and ascending aorta are structurally normal, with no evidence of dilitation.  Venous: The inferior vena cava is normal in size with greater than 50% respiratory variability, suggesting right atrial pressure of 3 mmHg.  IAS/Shunts: The interatrial septum was not well visualized.   LEFT VENTRICLE PLAX 2D LVIDd:         3.80 cm    Diastology LVIDs:         2.50 cm   LV e' medial:    6.85 cm/s LV PW:         0.70 cm   LV E/e' medial:  30.9 LV IVS:        1.30 cm   LV e' lateral:   9.46 cm/s LVOT diam:     1.80 cm   LV E/e' lateral: 22.4 LV SV:         77 LV SV Index:   49 LVOT Area:     2.54 cm   RIGHT VENTRICLE             IVC RV S prime:  17.90 cm/s  IVC diam: 1.20 cm TAPSE (M-mode): 1.9 cm  LEFT ATRIUM             Index        RIGHT ATRIUM           Index LA diam:        3.90 cm 2.47 cm/m   RA Area:     14.50 cm LA Vol (A2C):   58.1 ml 36.77 ml/m  RA Volume:   37.10 ml  23.48 ml/m LA Vol (A4C):   51.8 ml 32.78 ml/m LA Biplane Vol: 54.9 ml 34.74 ml/m AORTIC VALVE AV Area (Vmax):    1.43 cm AV Area (Vmean):   1.50 cm AV Area (VTI):     1.57 cm AV Vmax:           225.67 cm/s AV Vmean:          148.667 cm/s AV VTI:            0.492 m AV Peak Grad:      20.4 mmHg AV Mean Grad:      10.3 mmHg LVOT Vmax:         127.00 cm/s LVOT Vmean:        87.700 cm/s LVOT VTI:          0.304 m LVOT/AV VTI ratio: 0.62 AI PHT:            346 msec  AORTA Ao Root diam: 2.80 cm Ao Asc diam:  3.05 cm  MITRAL VALVE                TRICUSPID VALVE MV Area (PHT): 3.36 cm     TR Peak grad:   37.0 mmHg MV Area VTI:   1.61 cm     TR Vmax:        304.00 cm/s MV Peak grad:  20.8 mmHg MV Mean grad:  8.0 mmHg     SHUNTS MV Vmax:       2.28 m/s     Systemic VTI:  0.30 m MV Vmean:      131.0 cm/s   Systemic Diam: 1.80 cm MV Decel Time: 226 msec MV E velocity: 212.00 cm/s MV A velocity: 141.00 cm/s MV E/A ratio:  1.50  Epifanio Lesches MD Electronically signed by Epifanio Lesches MD Signature Date/Time: 11/17/2022/3:08:28 PM    Final    MONITORS  CARDIAC EVENT MONITOR 06/20/2020  Narrative  Cardiac monitoring period was from 05/18/20-06/16/20.  Predominant rhythm was NSR with average HR 83bpm; ranging from 55-138bpm  There was no atrial fibrillation, SVT or VT detected  Rare PVCs, rare  SVE  Overall, no significant arrhythmias or pauses detected on the monitor.  Laurance Flatten, MD   CT SCANS  CT CORONARY MORPH W/CTA COR W/SCORE 11/03/2019  Addendum 11/09/2019  8:00 AM ADDENDUM REPORT: 11/09/2019 07:58  CLINICAL DATA:  Aortic stenosis  EXAM: Cardiac TAVR CT  TECHNIQUE: The patient was scanned on a Siemens Force 192 slice scanner. A 120 kV retrospective scan was triggered in the descending thoracic aorta at 111 HU's. Gantry rotation speed was 270 msecs and collimation was .9 mm. No beta blockade or nitro were given. The 3D data set was reconstructed in 5% intervals of the R-R cycle. Systolic and diastolic phases were analyzed on a dedicated work station using MPR, MIP and VRT modes. The patient received OMNIPAQUE IOHEXOL 350 MG/ML SOLN of contrast.  FINDINGS: Aortic Valve: Tricuspid aortic valve. Severely reduced cusp separation.  Severely thickened, moderately calcified aortic valve cusps.  AV calcium score: 839  Virtual Basal Annulus Measurements:  Maximum/Minimum Diameter: 21.9 x 19.3 mm  Perimeter: 64.4 mm  Area: 321 mm2  LVOT calcifications extending to anterior mitral leaflet.  Based on these measurements, the annulus measures within range for a 20 mm Edwards Sapien 3 valve. Sizing is borderline for 23 mm vs 26 mm Medtronic Evolut Pro supra-annular valve.  Sinus of Valsalva Measurements:  Non-coronary:  26 mm  Right - coronary:  26 mm  Left - coronary:  27 mm  Sinus of Valsalva Height:  Left: 16.2 mm  Right: 17.6 mm  Aorta: Severe mixed atherosclerotic plaque.  Sinotubular Junction:  25 mm  Ascending Thoracic Aorta:   mm  Aortic Arch:   mm  Descending Thoracic Aorta:   mm  Coronary Artery Height above Annulus:  Left Main: 11.1 mm  Right Coronary: 15.2 mm  Coronary Arteries: 3 vessel coronary artery disease.  Optimum Fluoroscopic Angle for Delivery: LAO 1 CAU 1  No left atrial appendage thrombus.  Moderate  mitral annular calcification.  IMPRESSION: 1. Aortic Valve: Tricuspid aortic valve. Severely reduced cusp separation. Severely thickened, moderately calcified aortic valve cusps.  2.  AV calcium score: 839  3.  LVOT calcifications extending to anterior mitral leaflet.  4. Borderline height of the left main coronary artery ostia, 11 mm. Adequate height of the RCA ostium.  5. Based on these findings, the annulus measures within range for a 20 mm Edwards Sapien 3 valve. Sizing is borderline for 23 mm vs 26 mm Medtronic Evolut Pro supra-annular valve. Recommend Structural Heart Team discussion for valve selection.  6. Optimum Fluoroscopic Angle for Delivery: LAO 1 CAU 1   Electronically Signed By: Weston Brass On: 11/09/2019 07:58  Narrative EXAM: OVER-READ INTERPRETATION  CT CHEST  The following report is an over-read performed by radiologist Dr. Cleone Slim of [facility] on 11/03/2019. This over-read does not include interpretation of cardiac or coronary anatomy or pathology. The coronary CTA interpretation by the cardiologist is attached.  COMPARISON:  10/27/2014 chest CT angiogram.  FINDINGS: Please see the separate concurrent chest CT angiogram report for details.  IMPRESSION: Please see the separate concurrent chest CT angiogram report for details.  Electronically Signed: By: Delbert Phenix M.D. On: 11/03/2019 11:21          EKG:   EKG Interpretation Date/Time:  Wednesday February 04 2023 11:38:48 EST Ventricular Rate:  86 PR Interval:  154 QRS Duration:  56 QT Interval:  324 QTC Calculation: 387 R Axis:   39  Text Interpretation: Normal sinus rhythm Normal ECG When compared with ECG of 21-Dec-2019 02:30, No significant change was found Confirmed by Tonny Bollman [phone]) on 02/04/2023 11:43:15 AM    Recent Labs: No results found for requested labs within last 365 days.  Recent Lipid Panel    Component Value Date/Time   CHOL 157  12/22/2019 0841   CHOL 150 07/04/2019 0749   TRIG 151 (H) 12/22/2019 0841   HDL 67 12/22/2019 0841   HDL 68 07/04/2019 0749   CHOLHDL 2.3 12/22/2019 0841   VLDL 30 12/22/2019 0841   LDLCALC 60 12/22/2019 0841   LDLCALC 64 07/04/2019 0749          Physical Exam:    VS:  BP (!) 158/60   Pulse 86   Ht 5\' 3"  (1.6 m)   Wt 127 lb 9.6 oz (57.9 kg)   SpO2 97%   BMI  22.60 kg/m     Wt Readings from Last 3 Encounters:  02/04/23 127 lb 9.6 oz (57.9 kg)  01/21/23 127 lb 3.2 oz (57.7 kg)  09/22/22 124 lb 6.4 oz (56.4 kg)     GEN:  Well nourished, well developed in no acute distress HEENT: Normal NECK: No JVD; No carotid bruits LYMPHATICS: No lymphadenopathy CARDIAC: RRR, with soft systolic ejection murmur at the right upper sternal border and 2/6 diastolic decrescendo murmur at the left lower sternal border RESPIRATORY:  Clear to auscultation without rales, wheezing or rhonchi  ABDOMEN: Soft, non-tender, non-distended MUSCULOSKELETAL:  No edema; No deformity  SKIN: Warm and dry NEUROLOGIC:  Alert and oriented x 3 PSYCHIATRIC:  Normal affect   Assessment & Plan S/P TAVR (transcatheter aortic valve replacement) Normal transvalvular gradients noted.  Mild paravalvular regurgitation is audible on her exam.  Continue current management.  SBE prophylaxis is indicated lifelong. Coronary artery disease involving native coronary artery of native heart with angina pectoris (HCC) No anginal symptoms on current medical regimen.  Will continue aspirin for antiplatelet therapy, clopidogrel, carvedilol and diltiazem, and Repatha.  Patient is statin intolerant. Mitral valve stenosis, unspecified etiology Reviewed her echo study with a mean transmitral gradient of 8 mmHg.  She has heavy mitral annular calcification.  Discussed potential treatment options.  At 82 years old status post TAVR, surgical mitral valve replacement would be associated with significant risk.  She has a mild to moderate  functional limitation with only moderate mitral stenosis.  I would favor ongoing observation and serial imaging follow-up with surveillance echo studies performed on an annual basis.  She will reach out if her symptoms worsen, otherwise I will plan to see her back in 1 year. Essential hypertension Continue current Rx - home BP's have been in acceptable range.      Medication Adjustments/Labs and Tests Ordered: Current medicines are reviewed at length with the patient today.  Concerns regarding medicines are outlined above.  Orders Placed This Encounter  Procedures   EKG 12-Lead   ECHOCARDIOGRAM COMPLETE   No orders of the defined types were placed in this encounter.   Patient Instructions  Testing/Procedures: ECHO (in 1 year prior to appt) Your physician has requested that you have an echocardiogram. Echocardiography is a painless test that uses sound waves to create images of your heart. It provides your doctor with information about the size and shape of your heart and how well your heart's chambers and valves are working. This procedure takes approximately one hour. There are no restrictions for this procedure. Please do NOT wear cologne, perfume, aftershave, or lotions (deodorant is allowed). Please arrive 15 minutes prior to your appointment time.  Please note: We ask at that you not bring children with you during ultrasound (echo/ vascular) testing. Due to room size and safety concerns, children are not allowed in the ultrasound rooms during exams. Our front office staff cannot provide observation of children in our lobby area while testing is being conducted. An adult accompanying a patient to their appointment will only be allowed in the ultrasound room at the discretion of the ultrasound technician under special circumstances. We apologize for any inconvenience.  Follow-Up: At [facility], you and your health needs are our priority.  As part of our continuing mission to  provide you with exceptional heart care, we have created designated Provider Care Teams.  These Care Teams include your primary Cardiologist (physician) and Advanced Practice Providers (APPs -  Physician Assistants and Nurse Practitioners)  who all work together to provide you with the care you need, when you need it.  Your next appointment:   1 year(s)  Provider:   Tonny Bollman, MD        Signed, Tonny Bollman, MD  02/04/2023 12:59 PM    [facility]

## 2023-02-04 NOTE — Assessment & Plan Note (Signed)
Continue current Rx - home BP's have been in acceptable range.

## 2023-02-10 ENCOUNTER — Ambulatory Visit: Payer: Medicare Other | Admitting: Vascular Surgery

## 2023-02-10 ENCOUNTER — Ambulatory Visit (HOSPITAL_COMMUNITY): Payer: Medicare Other

## 2023-04-21 ENCOUNTER — Encounter: Payer: Self-pay | Admitting: Vascular Surgery

## 2023-04-21 ENCOUNTER — Ambulatory Visit (HOSPITAL_COMMUNITY)
Admission: RE | Admit: 2023-04-21 | Discharge: 2023-04-21 | Disposition: A | Discharge status: Home / Self Care | Payer: Medicare Other | Source: Ambulatory Visit | Attending: Vascular Surgery | Admitting: Vascular Surgery

## 2023-04-21 ENCOUNTER — Ambulatory Visit: Payer: Medicare Other | Admitting: Vascular Surgery

## 2023-04-21 VITALS — BP 149/70 | HR 78 | Temp 98.0°F | Resp 18 | Ht 63.0 in | Wt 130.2 lb

## 2023-04-21 DIAGNOSIS — I6523 Occlusion and stenosis of bilateral carotid arteries: Secondary | ICD-10-CM | POA: Diagnosis not present

## 2023-04-21 NOTE — Progress Notes (Signed)
Patient name: Cheryl Hernandez MRN: [id number] DOB: 08-07-1940 Sex: female  REASON FOR CONSULT: Surveillance of carotid artery disease  HPI: Cheryl Hernandez is a 83 y.o. female, with multiple medical problems including CAD s/p PCI, carotid artery disease, HTN, HLD, aortic stenosis s/p TAVR, DM that presents for 1 year follow-up for surveillance of her carotid artery disease.  She was previously followed by Dr. Darrick Penna who retired from our practice.  She had a history of stroke around her TAVR aortic valve replacement with MRI evidence of bilateral posterior and anterior circulation infarcts.  She was last seen 10/18/2020 by Dr. Darrick Penna and had had some left eye vision loss in the corner.  When she last saw Dr. Darrick Penna he recommended continued surveillance with 1 year follow-up and did not feel this was related to her carotid arteries.  On my last evaluation she had 1 to 39% stenosis bilaterally.  She reports no new neurologic events.  Remains on aspirin Plavix.   Past Medical History:  Diagnosis Date   Arthritis    Asthma    CAD (coronary artery disease)    a. NSTEMI 2009 s/p DES to RCAx2.   Carotid artery disease (HCC)    a. Mild-moderate - followed by [facility].   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 Excell Seltzer and Dr. Cornelius Moras    Severe aortic stenosis     Past Surgical History:  Procedure Laterality Date   ANGIOPLASTY     CORONARY ANGIOPLASTY  WITH STENT PLACEMENT     RIGHT/LEFT HEART CATH AND CORONARY ANGIOGRAPHY N/A 08/08/2019   Procedure: RIGHT/LEFT HEART CATH AND CORONARY ANGIOGRAPHY;  Surgeon: Lyn Records, MD;  Location: [facility] INVASIVE CV LAB;  Service: Cardiovascular;  Laterality: N/A;   TEE WITHOUT CARDIOVERSION N/A 12/20/2019   Procedure: TRANSESOPHAGEAL ECHOCARDIOGRAM (TEE);  Surgeon: Tonny Bollman, MD;  Location: [facility] INVASIVE CV LAB;  Service: Open Heart Surgery;  Laterality: N/A;   TONSILLECTOMY     TOTAL ABDOMINAL HYSTERECTOMY     TOTAL HIP ARTHROPLASTY     TRANSCATHETER AORTIC VALVE REPLACEMENT, TRANSFEMORAL N/A 12/20/2019   Procedure: TRANSCATHETER AORTIC VALVE REPLACEMENT, TRANSFEMORAL;  Surgeon: Tonny Bollman, MD;  Location: [facility] INVASIVE CV LAB;  Service: Open Heart Surgery;  Laterality: N/A;    Family History  Problem Relation Age of Onset   Allergies Mother    Hyperlipidemia Mother    Prostate cancer Maternal Grandfather    Heart attack Father    Lung disease Neg Hx     SOCIAL HISTORY: Social History   Socioeconomic History   Marital status: Widowed    Spouse name: Not on file   Number of children: Not on file   Years of education: Not on file   Highest education level: Not on file  Occupational History   Occupation:  retired Char[employer] fundraiser  Tobacco Use   Smoking status: Former    Current packs/day: 0.00    Average packs/day: 1 pack/day for 30.0 years (30.0 ttl pk-yrs)    Types: Cigarettes    Start date: 03/31/1968    Quit date: 03/31/1998    Years since quitting: 25.0   Smokeless tobacco: Never   Tobacco comments:    Quit intermittently since her 66s.  Vaping Use   Vaping status: Never Used  Substance and Sexual Activity   Alcohol use: Yes    Alcohol/week: 0.0 standard drinks of alcohol    Comment: Rare.   Drug use: No   Sexual activity: Not on file  Other Topics Concern   Not on file  Social History Narrative   Originally from [place]. She has traveled to [place], [place], [place], & [place]. Previously worked as a  Engineer, civil (consulting). No known TB exposure. No pets currently. No bird, hot tube, or asbestos exposure. Enjoys playing bridge.    Social Drivers of Corporate investment banker Strain: Not on file  Food Insecurity: Not on file  Transportation Needs: Not on file  Physical Activity: Not on file  Stress: Not on file  Social Connections: Not on file  Intimate Partner Violence: Not on file    Allergies  Allergen Reactions   Amlodipine Swelling   Budesonide-Formoterol Fumarate     Other Reaction(s): had reaction, but taking lower dose now   Cefuroxime Axetil Hives and Itching   Cephalexin Other (See Comments)    Total body pain in joints, couldn't swallow    Codeine     Tolerates Dilaudid.   Doxycycline     REACTION: vomiting/ GI upset   Erythromycin Base Other (See Comments)    unknown   Linaclotide     Other reaction(s): Diarrhea   Statins     Myalgia with crestor   Telmisartan     Other reaction(s): Unknown   Tiotropium Bromide Monohydrate     Other Reaction(s): Unknown    Current Outpatient Medications  Medication Sig Dispense Refill   acetaminophen (TYLENOL) 500 MG tablet Take 500 mg by mouth every 6 (six) hours as needed (for arthritis pain.).     ACETYLCARNITINE HCL PO Take by mouth daily.     albuterol (VENTOLIN HFA) 108 (90 Base) MCG/ACT inhaler Inhale 2 puffs into the lungs every 6 (six) hours as needed for wheezing or shortness of breath. 18 g 3   Alpha-D-Galactosidase (BEANO PO) Take 1 tablet by mouth daily as needed (flatulence/abdominal bloating.).     aspirin EC 81 MG tablet Take 81 mg by mouth every evening.     azithromycin (ZITHROMAX) 500 MG tablet Take 1 tablet (500 mg total) by mouth as directed. Take one tablet 1 hour before any dental work including cleanings. 6 tablet 12   B Complex-C (B-COMPLEX WITH VITAMIN C) tablet Take 1 tablet by mouth daily.      budesonide (ENTOCORT EC) 3 MG 24 hr capsule Take 2 capsules (6 mg total) by mouth 2 (two) times daily as needed  (diarrhea). 360 capsule 2   budesonide-formoterol (SYMBICORT) 80-4.5 MCG/ACT inhaler Inhale 2 puffs into the lungs 2 (two) times daily as needed (congestion). 10.2 g 11   calcium carbonate (TUMS - DOSED IN MG ELEMENTAL CALCIUM) 500 MG chewable tablet Chew 1 tablet by mouth 3 (three) times daily as needed for heartburn.      carvedilol (COREG) 6.25 MG tablet Take 6.25 mg by mouth 2 (two) times daily with a  meal.     Cholecalciferol (VITAMIN D3) 75 MCG (3000 UT) TABS Take 3,000 Units by mouth every other day.     clopidogrel (PLAVIX) 75 MG tablet Take 1 tablet (75 mg total) by mouth daily. 90 tablet 1   Coenzyme Q10 200 MG capsule Take 200 mg by mouth daily.     diltiazem (CARDIZEM) 30 MG tablet Take 1 tablet (30 mg total) by mouth 2 (two) times daily as needed (breakthrough palpitations). 60 tablet 1   docusate sodium (COLACE) 100 MG capsule 100 mg as needed.     Evolocumab (REPATHA SURECLICK) 140 MG/ML SOAJ INJECT 1 ML INTO THE SKIN ONCE EVERY 14 DAYS 6 mL 3   fexofenadine (ALLEGRA) 180 MG tablet Take 180 mg by mouth daily.     furosemide (LASIX) 20 MG tablet Take 1 tablet (20 mg total) by mouth daily. 90 tablet 3   Glucose Blood (FREESTYLE LITE TEST VI) Once daily     guaifenesin (HUMIBID E) 400 MG TABS tablet Take 400 mg by mouth daily.     KRILL OIL PO Take 1 capsule by mouth daily.     Magnesium Citrate 125 MG CAPS Take 125 mg by mouth daily.     metFORMIN (GLUCOPHAGE-XR) 500 MG 24 hr tablet Take 500 mg by mouth daily.     Misc Natural Products (LEG VEIN & CIRCULATION PO) Take by mouth daily.     Multiple Vitamins-Minerals (MULTIVITAMIN WITH MINERALS) tablet Take 1 tablet by mouth every other day. In the morning     nitroGLYCERIN (NITROSTAT) 0.4 MG SL tablet Place 1 tablet (0.4 mg total) under the tongue every 5 (five) minutes as needed for chest pain. 25 tablet 3   Probiotic Product (ALIGN) 4 MG CAPS Take 4 mg by mouth every other day.     SYMBICORT 80-4.5 MCG/ACT inhaler INHALE 2 PUFFS BY  MOUTH TWICE A DAY AS NEEDED FOR CONGESTION 10.2 g 11   Turmeric 400 MG CAPS Take by mouth daily.     UNABLE TO FIND Med Name: saffron 15     ALPRAZolam (XANAX) 0.25 MG tablet Take 0.5 tablets by mouth as needed. (Patient not taking: Reported on 04/21/2023)     No current facility-administered medications for this visit.    REVIEW OF SYSTEMS:  [X]  denotes positive finding, [ ]  denotes negative finding Cardiac  Comments:  Chest pain or chest pressure:    Shortness of breath upon exertion:    Short of breath when lying flat:    Irregular heart rhythm:        Vascular    Pain in calf, thigh, or hip brought on by ambulation:    Pain in feet at night that wakes you up from your sleep:     Blood clot in your veins:    Leg swelling:         Pulmonary    Oxygen at home:    Productive cough:     Wheezing:         Neurologic    Sudden weakness in arms or legs:     Sudden numbness in arms or legs:     Sudden onset of difficulty speaking or slurred speech:    Temporary loss of vision in one eye:     Problems with dizziness:         Gastrointestinal    Blood in stool:     Vomited blood:         Genitourinary    Burning  when urinating:     Blood in urine:        Psychiatric    Major depression:         Hematologic    Bleeding problems:    Problems with blood clotting too easily:        Skin    Rashes or ulcers:        Constitutional    Fever or chills:      PHYSICAL EXAM: Vitals:   04/21/23 1130 04/21/23 1132  BP: (!) 162/77 (!) 149/70  Pulse: 78   Resp: 18   Temp: 98 F (36.7 C)   TempSrc: Temporal   SpO2: 96%   Weight: 130 lb 3.2 oz (59.1 kg)   Height: 5\' 3"  (1.6 m)     GENERAL: The patient is a well-nourished female, in no acute distress. The vital signs are documented above. CARDIAC: There is a regular rate and rhythm.  VASCULAR:  No previous neck incisions PULMONARY: No respiratory distress. ABDOMEN: Soft and non-tender. MUSCULOSKELETAL: There are no  major deformities or cyanosis. NEUROLOGIC: No focal weakness or paresthesias are detected.  Cranial nerves II through XII grossly intact. SKIN: There are no ulcers or rashes noted. PSYCHIATRIC: The patient has a normal affect.  DATA:   Carotid duplex today shows minimal 1 to 39% stenosis bilaterally  Assessment/Plan:  84 year old female presents for 1 year follow-up for surveillance of her carotid artery disease.  She had bilateral strokes years ago at the time of TAVR aortic valve replacement in 2021 felt to be cardioembolic.  She was seen in our practice last year by Dr. Darrick Penna and had some left eye vision loss but her carotid stenosis was barely 50% and Dr. Darrick Penna did not want a pursue further work-up unless she had a second event.   She was last seen with minimal carotid stenosis on my evaluation last year.  On follow-up today she again has 1 to 39% stenosis bilaterally.  I discussed this is not significant.  She does not require surgical intervention.  She is on aspirin Plavix for risk reduction.  I will follow-up with her in 1 year with repeat carotid duplex for ongoing surveillance.     Cephus Shelling, MD [facility] Office: [phone]

## 2023-05-12 ENCOUNTER — Other Ambulatory Visit: Payer: Self-pay

## 2023-05-12 DIAGNOSIS — I6523 Occlusion and stenosis of bilateral carotid arteries: Secondary | ICD-10-CM

## 2023-06-29 ENCOUNTER — Other Ambulatory Visit: Payer: Self-pay | Admitting: Pharmacist

## 2023-06-29 DIAGNOSIS — E7800 Pure hypercholesterolemia, unspecified: Secondary | ICD-10-CM

## 2023-06-29 DIAGNOSIS — I25119 Atherosclerotic heart disease of native coronary artery with unspecified angina pectoris: Secondary | ICD-10-CM

## 2023-06-29 MED ORDER — REPATHA SURECLICK 140 MG/ML ~~LOC~~ SOAJ
SUBCUTANEOUS | 3 refills | Status: AC
Start: 2023-06-29 — End: ?

## 2023-09-09 ENCOUNTER — Telehealth: Payer: Self-pay | Admitting: Cardiovascular Disease

## 2023-09-09 ENCOUNTER — Other Ambulatory Visit: Payer: Self-pay

## 2023-09-09 MED ORDER — CARVEDILOL 6.25 MG PO TABS: 6.2500 mg | ORAL_TABLET | Freq: Two times a day (BID) | ORAL | 1 refills | Status: DC

## 2023-09-09 NOTE — Telephone Encounter (Signed)
*  STAT* If patient is at the pharmacy, call can be transferred to refill team.   1. Which medications need to be refilled? (please list name of each medication and dose if known) carvedilol  (COREG ) 6.25 MG tablet   2. Which pharmacy/location (including street and city if local pharmacy) is medication to be sent to?  [facility] [id number] - [place], [place] - [address]    3. Do they need a 30 day or 90 day supply? 90

## 2023-10-22 ENCOUNTER — Other Ambulatory Visit: Payer: Self-pay | Admitting: Pulmonary Disease

## 2023-12-21 ENCOUNTER — Other Ambulatory Visit: Payer: Self-pay | Admitting: Pulmonary Disease

## 2024-01-04 ENCOUNTER — Ambulatory Visit (HOSPITAL_COMMUNITY)
Admission: RE | Admit: 2024-01-04 | Discharge: 2024-01-04 | Disposition: A | Discharge status: Home / Self Care | Payer: Medicare Other | Source: Ambulatory Visit | Attending: Internal Medicine | Admitting: Internal Medicine

## 2024-01-04 ENCOUNTER — Other Ambulatory Visit (HOSPITAL_COMMUNITY): Payer: Medicare Other

## 2024-01-04 DIAGNOSIS — Z952 Presence of prosthetic heart valve: Secondary | ICD-10-CM | POA: Diagnosis not present

## 2024-01-04 DIAGNOSIS — I050 Rheumatic mitral stenosis: Secondary | ICD-10-CM | POA: Diagnosis present

## 2024-01-04 DIAGNOSIS — I349 Nonrheumatic mitral valve disorder, unspecified: Secondary | ICD-10-CM

## 2024-01-04 LAB — ECHOCARDIOGRAM COMPLETE
AR max vel: 1.98 cm2
AV Area VTI: 2.35 cm2
AV Area mean vel: 2.29 cm2
AV Mean grad: 9 mmHg
AV Peak grad: 19.3 mmHg
Ao pk vel: 2.2 m/s
Area-P 1/2: 3.48 cm2
MV VTI: 2.01 cm2
P 1/2 time: 289 ms
S' Lateral: 2.2 cm

## 2024-01-05 ENCOUNTER — Ambulatory Visit: Payer: Self-pay | Admitting: Cardiovascular Disease

## 2024-01-15 ENCOUNTER — Telehealth: Payer: Self-pay | Admitting: Cardiovascular Disease

## 2024-01-15 NOTE — Telephone Encounter (Signed)
 Per recall pt needs an order for Echo currently there is not an Echo order in the chart. Pt is scheduled to see Dr Wonda 04/14/24 Please Advise

## 2024-03-02 ENCOUNTER — Other Ambulatory Visit: Payer: Self-pay | Admitting: Pulmonary Disease

## 2024-03-04 ENCOUNTER — Telehealth: Payer: Self-pay | Admitting: Pulmonary Disease

## 2024-03-04 ENCOUNTER — Ambulatory Visit (HOSPITAL_BASED_OUTPATIENT_CLINIC_OR_DEPARTMENT_OTHER): Admitting: Pulmonary Disease

## 2024-03-04 MED ORDER — ALBUTEROL SULFATE HFA 108 (90 BASE) MCG/ACT IN AERS: 2.0000 | INHALATION_SPRAY | Freq: Four times a day (QID) | RESPIRATORY_TRACT | 1 refills | Status: AC | PRN

## 2024-03-04 MED ORDER — BUDESONIDE-FORMOTEROL FUMARATE 80-4.5 MCG/ACT IN AERO: 2.0000 | INHALATION_SPRAY | Freq: Two times a day (BID) | RESPIRATORY_TRACT | 0 refills | Status: AC | PRN

## 2024-03-04 NOTE — Telephone Encounter (Signed)
 Rx's sent to pharmacy.

## 2024-03-04 NOTE — Telephone Encounter (Signed)
 PT is out of Rx and needs a refill to last tell the apt in Jan.

## 2024-03-12 ENCOUNTER — Other Ambulatory Visit: Payer: Self-pay | Admitting: Cardiovascular Disease

## 2024-04-11 ENCOUNTER — Encounter: Payer: Self-pay | Admitting: Primary Care

## 2024-04-11 ENCOUNTER — Ambulatory Visit: Admitting: Primary Care

## 2024-04-11 ENCOUNTER — Ambulatory Visit (HOSPITAL_BASED_OUTPATIENT_CLINIC_OR_DEPARTMENT_OTHER): Admitting: Pulmonary Disease

## 2024-04-11 VITALS — BP 130/50 | HR 90 | Temp 97.7°F | Ht 61.0 in | Wt 123.0 lb

## 2024-04-11 DIAGNOSIS — J45901 Unspecified asthma with (acute) exacerbation: Secondary | ICD-10-CM

## 2024-04-11 DIAGNOSIS — Z87891 Personal history of nicotine dependence: Secondary | ICD-10-CM

## 2024-04-11 DIAGNOSIS — J302 Other seasonal allergic rhinitis: Secondary | ICD-10-CM

## 2024-04-11 DIAGNOSIS — J4531 Mild persistent asthma with (acute) exacerbation: Secondary | ICD-10-CM

## 2024-04-11 MED ORDER — PREDNISONE 20 MG PO TABS: ORAL_TABLET | ORAL | 0 refills | Status: AC

## 2024-04-11 MED ORDER — BUDESONIDE-FORMOTEROL FUMARATE 80-4.5 MCG/ACT IN AERO: 2.0000 | INHALATION_SPRAY | Freq: Two times a day (BID) | RESPIRATORY_TRACT | 11 refills | Status: AC

## 2024-04-11 NOTE — Patient Instructions (Addendum)
" ° °  VISIT SUMMARY: Today, you came in for a follow-up appointment to check on your asthma and seasonal allergies. Your asthma has been well controlled over the past year and a half, but you are currently experiencing some congestion, chest tightness, wheezing, and hoarseness due to seasonal allergies.  YOUR PLAN: -ASTHMA WITH ACUTE EXACERBATION: Asthma is a condition where your airways narrow and swell, making it difficult to breathe. Your asthma symptoms have worsened due to seasonal allergies. You have been prescribed prednisone  20 mg once daily for 7 days to help reduce inflammation. Continue using Symbicort  80 mcg, two puffs twice daily, and use albuterol  as needed for shortness of breath. If your symptoms do not improve, we may consider a chest x-ray or antibiotic treatment.  -SEASONAL ALLERGIC RHINITIS: Seasonal allergic rhinitis is an allergic reaction to pollen that causes congestion and other symptoms. This is contributing to your asthma exacerbation. Continue taking Allegra and over-the-counter congestion medication as needed to manage your symptoms.  INSTRUCTIONS: Please follow the prescribed treatment plan and continue taking your medications as directed. If your symptoms do not improve, contact our office for further evaluation. We may consider additional tests or treatments if necessary.  Follow-up 6 months with Dr. Pleas or Dr. Theophilus (new patient- asthma)    "

## 2024-04-11 NOTE — Progress Notes (Signed)
 "  @Patient  ID: Cheryl Hernandez, female    DOB: 1940-07-06, 84 y.o.   MRN: [id number]  Chief Complaint  Patient presents with   Asthma    Having some congestion, clear/white mucus.      Referring provider: Avva, Ravisankar, MD  HPI: 84 yo remote smoker  for follow-up of moderate persistent asthma and chronic allergic rhinitis PMH - TAVR 2021   Previous [facility] pulmonary encounter: Annual follow-up visit  She states that pollen season was rough.  She is compliant with Symbicort  80.  She had to use albuterol  occasionally for breakthrough.  She reports a lot of congestion and takes Mucinex on an as-needed basis. She did not need oral steroids. She is compliant with Lasix  and watches the Hernandez in her diet She has been found to have moderate MR and severe mitral annular calcification on echo 10/2021.  Did not have a follow-up echo yet in 2024 She reports some hoarseness of voice   04/11/2024- Interim hx  Discussed the use of AI scribe software for clinical note transcription with the patient, who gave verbal consent to proceed.  History of Present Illness Cheryl Hernandez is an 84 year old female with asthma who presents for an overdue follow-up.  Her asthma symptoms have been relatively well controlled over the past year and a half. She uses Symbicort  as her maintenance inhaler twice daily and albuterol  as needed. She experiences shortness of breath once a day and uses her rescue inhaler two to four times a week. Her asthma does not interfere with her daily activities or sleep.  She experiences seasonal congestion, particularly during the spring pollen season, which she manages with Allegra and an over-the-counter congestion medication. She is currently experiencing congestion, which causes chest tightness, wheezing, and hoarseness. She is coughing up white sputum.  No fever or purulent mucus. Her asthma control score has improved from 17 last year to 19 this year.  Significant  tests/ events reviewed   CT angiogram chest 10/2019 no evidence of ILD   PFT 08/2015: FVC 2.24 L (85%) FEV1 1.82 L (93%) ratio 81  no bronchodilator response TLC 4.49 L (91%) RV 90% ERV 27% DLCO uncorrected 81% (hemoglobin 13.6)     11/2014: FVC 3.52 L (130%)  FEV1 2.81 L (139%) FEV1/FVC 0.80    09/13/10: FVC 2.49 L (87%) FEV1 1.92 L (89%) FEV1/FVC 0.77   Allergies[1]  Immunization History  Administered Date(s) Administered   Fluad Quad(high Dose 65+) 01/13/2024   INFLUENZA, HIGH DOSE SEASONAL PF 12/15/2014, 11/26/2016, 11/11/2018   Influenza Split 10/30/2010, 12/10/2011, 03/31/2012, 12/21/2013, 12/30/2014   Influenza Whole 01/01/2009   Influenza,inj,Quad PF,6+ Mos 12/01/2012, 11/18/2017   Influenza,inj,quad, With Preservative 11/26/2016, 12/29/2017, 02/03/2020   Influenza-Unspecified 12/29/2013, 11/30/2015   PFIZER(Purple Top)SARS-COV-2 Vaccination 04/14/2019, 05/03/2019, 12/28/2019, 02/03/2020   Pneumococcal Conjugate-13 09/09/2013   Pneumococcal Polysaccharide-23 01/20/2007   Tdap 12/15/2014   Zoster Recombinant(Shingrix) 01/14/2017, 08/13/2017   Zoster, Live 09/09/2011, 01/14/2017    Past Medical History:  Diagnosis Date   Arthritis    Asthma    CAD (coronary artery disease)    a. NSTEMI 2009 s/p DES to RCAx2.   Carotid artery disease    a. Mild-moderate - followed by [facility].   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 Wonda  and Dr. Dusty    Severe aortic stenosis     Tobacco History: Tobacco Use History[2] Counseling given: Not Answered Tobacco comments: Quit intermittently since her 33s.   Outpatient Medications Prior to Visit  Medication Sig Dispense Refill   acetaminophen  (TYLENOL ) 500 MG tablet Take 500 mg by mouth every 6 (six) hours as needed (for arthritis pain.).     ACETYLCARNITINE HCL PO Take by mouth daily.     albuterol  (VENTOLIN  HFA) 108 (90 Base) MCG/ACT inhaler Inhale 2 puffs into the lungs every 6 (six) hours as needed for wheezing or shortness of breath. 18 g 1   Alpha-D-Galactosidase (BEANO PO) Take 1 tablet by mouth daily as needed (flatulence/abdominal bloating.).     ALPRAZolam (XANAX) 0.25 MG tablet Take 0.5 tablets by mouth as needed. (Patient not taking: Reported on 04/21/2023)     aspirin  EC 81 MG tablet Take 81 mg by mouth every evening.     azithromycin  (ZITHROMAX ) 500 MG tablet Take 1 tablet (500 mg total) by mouth as directed. Take one tablet 1 hour before any dental work including cleanings. 6 tablet 12   B Complex-C (B-COMPLEX WITH VITAMIN C) tablet Take 1 tablet by mouth daily.      budesonide  (ENTOCORT EC ) 3 MG 24 hr capsule Take 2 capsules (6 mg total) by mouth 2 (two) times daily as needed (diarrhea). 360 capsule 2   budesonide -formoterol  (SYMBICORT ) 80-4.5 MCG/ACT inhaler Inhale 2 puffs into the lungs 2 (two) times daily as needed (congestion). 10.2 g 0   calcium  carbonate (TUMS - DOSED IN MG ELEMENTAL CALCIUM ) 500 MG chewable tablet Chew 1 tablet by mouth 3 (three) times daily as needed for heartburn.      carvedilol  (COREG ) 6.25 MG tablet Take 1 tablet (6.25 mg total) by mouth 2 (two) times daily with a meal. PLEASE KEEP UPCOMING APPOINTMENT IN ORDER TO RECEIVE ADDITIONAL REFILLS, THANK YOU! 60 tablet 0   Cholecalciferol (VITAMIN D3) 75 MCG (3000 UT) TABS Take 3,000 Units by mouth every other day.     clopidogrel  (PLAVIX ) 75 MG tablet Take 1 tablet (75 mg total) by mouth  daily. 90 tablet 1   Coenzyme Q10 200 MG capsule Take 200 mg by mouth daily.     diltiazem  (CARDIZEM ) 30 MG tablet Take 1 tablet (30 mg total) by mouth 2 (two) times daily as needed (breakthrough palpitations). 60 tablet 1   docusate sodium (COLACE) 100 MG capsule 100 mg as needed.     Evolocumab  (REPATHA  SURECLICK) 140 MG/ML SOAJ INJECT 1 ML INTO THE SKIN ONCE EVERY 14 DAYS 6 mL 3   fexofenadine (ALLEGRA) 180 MG tablet Take 180 mg by mouth daily.     furosemide  (LASIX ) 20 MG tablet Take 1 tablet (20 mg total) by mouth daily. 90 tablet 3   Glucose Blood (FREESTYLE LITE TEST VI) Once daily     guaifenesin (HUMIBID E) 400 MG TABS tablet Take 400 mg by mouth daily.     KRILL OIL PO Take 1 capsule by  mouth daily.     Magnesium  Citrate 125 MG CAPS Take 125 mg by mouth daily.     metFORMIN  (GLUCOPHAGE -XR) 500 MG 24 hr tablet Take 500 mg by mouth daily.     Misc Natural Products (LEG VEIN & CIRCULATION PO) Take by mouth daily.     Multiple Vitamins-Minerals (MULTIVITAMIN WITH MINERALS) tablet Take 1 tablet by mouth every other day. In the morning     nitroGLYCERIN  (NITROSTAT ) 0.4 MG SL tablet Place 1 tablet (0.4 mg total) under the tongue every 5 (five) minutes as needed for chest pain. 25 tablet 3   Probiotic Product (ALIGN) 4 MG CAPS Take 4 mg by mouth every other day.     SYMBICORT  80-4.5 MCG/ACT inhaler INHALE 2 PUFFS BY MOUTH 2 TIMES A DAY AS NEEDED FOR CONGESTION 10.2 g 0   Turmeric 400 MG CAPS Take by mouth daily.     UNABLE TO FIND Med Name: saffron 15     No facility-administered medications prior to visit.    Review of Systems  Review of Systems  Constitutional: Negative.   HENT:  Positive for congestion.   Respiratory:  Positive for cough and shortness of breath.      Physical Exam  BP (!) 130/50 (BP Location: Left Arm, Patient Position: Sitting)   Pulse 90   Temp 97.7 F (36.5 C) (Oral)   Ht 5' 1 (1.549 m)   Wt 123 lb (55.8 kg)   SpO2 95% Comment: RA  BMI 23.24 kg/m   Physical Exam Constitutional:      Appearance: Normal appearance.  HENT:     Head: Normocephalic and atraumatic.  Cardiovascular:     Rate and Rhythm: Normal rate and regular rhythm.  Pulmonary:     Effort: Pulmonary effort is normal.     Breath sounds: Wheezing and rhonchi present.     Comments: Distant wheeze/rhonchi left side  Neurological:     General: No focal deficit present.     Mental Status: She is alert and oriented to person, place, and time. Mental status is at baseline.  Psychiatric:        Mood and Affect: Mood normal.        Behavior: Behavior normal.        Thought Content: Thought content normal.        Judgment: Judgment normal.      Lab Results:  CBC    Component Value Date/Time   WBC 10.3 12/22/2019 0841   RBC 3.68 (L) 12/22/2019 0841   HGB 11.3 (L) 12/22/2019 0841   HGB 13.0 07/25/2019 0923   HCT 35.7 (L) 12/22/2019 0841   HCT 38.2 07/25/2019 0923   PLT 207 12/22/2019 0841   PLT 263 07/25/2019 0923   MCV 97.0 12/22/2019 0841   MCV 92 07/25/2019 0923   MCH 30.7 12/22/2019 0841   MCHC 31.7 12/22/2019 0841   RDW 14.7 12/22/2019 0841   RDW 13.1 07/25/2019 0923   LYMPHSABS 2.3 10/06/2014 1420   MONOABS 0.7 10/06/2014 1420   EOSABS 0.2 10/06/2014 1420   BASOSABS 0.1 10/06/2014 1420    BMET    Component Value Date/Time   NA 138 11/19/2021 0854   K 4.6 11/19/2021 0854   CL 98 11/19/2021 0854   CO2 26 11/19/2021 0854   GLUCOSE 103 (H) 11/19/2021 0854   GLUCOSE 176 (H) 12/22/2019 0841   BUN 17 11/19/2021 0854   CREATININE 0.86 11/19/2021 0854   CALCIUM  10.6 (H) 11/19/2021 0854   GFRNONAA 79  03/02/2020 1034   GFRAA 91 03/02/2020 1034    BNP    Component Value Date/Time   BNP 117.4 (H) 12/16/2019 1134    ProBNP    Component Value Date/Time   PROBNP 250 11/19/2021 0854   PROBNP 28.0 12/15/2014 1139    Imaging: No results found.   Assessment & Plan:   Assessment and Plan Assessment & Plan Asthma with acute  exacerbation Asthma exacerbation likely due to seasonal allergies, presenting with congestion, hoarseness, and wheezing. No fever or purulent mucus. Mild wheeze on the left side. Asthma control score improved from 17 to 19 over the past year, indicating well-controlled asthma overall. - Prescribed prednisone  20 mg once daily for 7 days for mild acute exacerbation - Continue Symbicort  80 mcg, two puffs twice daily. - Use albuterol  2 puffs every 4-6 hours as needed for shortness of breath. - If symptoms do not improve, will consider chest x-ray or antibiotic treatment.  Seasonal allergic rhinitis Contributing to asthma exacerbation, with symptoms of congestion and hoarseness. Currently managed with Allegra and over-the-counter congestion medication. - Continue Allegra as needed. - Continue over-the-counter congestion medication as needed.  Almarie LELON Ferrari, NP 04/11/2024    [1]  Allergies Allergen Reactions   Amlodipine  Swelling   Budesonide -Formoterol  Fumarate     Other Reaction(s): had reaction, but taking lower dose now   Cefuroxime Axetil Hives and Itching   Cephalexin Other (See Comments)    Total body pain in joints, couldn't swallow    Codeine     Tolerates Dilaudid .   Doxycycline     REACTION: vomiting/ GI upset   Erythromycin Base Other (See Comments)    unknown   Linaclotide     Other reaction(s): Diarrhea   Statins     Myalgia with crestor    Telmisartan     Other reaction(s): Unknown   Tiotropium Bromide     Other Reaction(s): Unknown  [2]  Social History Tobacco Use  Smoking Status Former   Current packs/day: 0.00   Average packs/day: 1 pack/day for 30.0 years (30.0 ttl pk-yrs)   Types: Cigarettes   Start date: 03/31/1968   Quit date: 03/31/1998   Years since quitting: 26.0  Smokeless Tobacco Never  Tobacco Comments   Quit intermittently since her 4s.   "

## 2024-04-13 ENCOUNTER — Other Ambulatory Visit: Payer: Self-pay | Admitting: Cardiovascular Disease

## 2024-04-13 NOTE — Telephone Encounter (Signed)
 In accordance with refill protocols, please review and address the following requirements before this medication refill can be authorized:  Labs

## 2024-04-14 ENCOUNTER — Ambulatory Visit: Attending: Cardiovascular Disease | Admitting: Cardiovascular Disease

## 2024-04-14 ENCOUNTER — Encounter: Payer: Self-pay | Admitting: Cardiovascular Disease

## 2024-04-14 VITALS — BP 130/60 | HR 94 | Ht 61.0 in | Wt 122.2 lb

## 2024-04-14 DIAGNOSIS — E782 Mixed hyperlipidemia: Secondary | ICD-10-CM

## 2024-04-14 DIAGNOSIS — I25119 Atherosclerotic heart disease of native coronary artery with unspecified angina pectoris: Secondary | ICD-10-CM | POA: Diagnosis not present

## 2024-04-14 DIAGNOSIS — Z952 Presence of prosthetic heart valve: Secondary | ICD-10-CM

## 2024-04-14 DIAGNOSIS — I10 Essential (primary) hypertension: Secondary | ICD-10-CM | POA: Diagnosis not present

## 2024-04-14 DIAGNOSIS — I342 Nonrheumatic mitral (valve) stenosis: Secondary | ICD-10-CM

## 2024-04-14 MED ORDER — ROSUVASTATIN CALCIUM 5 MG PO TABS: 5.0000 mg | ORAL_TABLET | Freq: Every day | ORAL | 3 refills | Status: AC

## 2024-04-14 NOTE — Assessment & Plan Note (Signed)
 NYHA functional class I.  Mean transvalvular gradient 9 mmHg with trivial paravalvular regurgitation on recent echo.  She should follow SBE prophylaxis when indicated per guidelines.

## 2024-04-14 NOTE — Assessment & Plan Note (Signed)
 Blood pressure is well controlled

## 2024-04-14 NOTE — Progress Notes (Signed)
 " Cardiology Office Note:    Date:  04/14/2024   ID:  Cheryl Hernandez, DOB Dec 13, 1940, MRN [id number]  PCP:  Janey Santos, MD   [facility] Providers Cardiologist:  Ozell Fell, MD     Referring MD: Janey Santos, MD   Chief Complaint  Patient presents with   Follow-up    S/P TAVR    History of Present Illness:    Cheryl Hernandez is a 84 y.o. female presenting for follow-up of aortic disease and dyspnea.  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  The patient's surveillance echocardiogram was completed in October 2025 and it demonstrated an LVEF of 65 to 70%, grade 2 diastolic dysfunction, normal RV function with mildly elevated PA systolic pressure, moderate mitral regurgitation, mild to moderate mitral stenosis with a mean gradient of 8 mmHg and calculated mitral valve area of 2 cm, and normal function of the aortic bioprosthesis with a 26 mm Medtronic Evolut Pro valve with mean gradient 9 mmHg and trivial paravalvular regurgitation.  The patient is here alone today.  She reports that she is doing pretty well from a cardiovascular perspective.  She denies chest pain, chest pressure, shortness of breath, lightheadedness, or leg swelling.  She has had a difficult year as her daughter has received a bad diagnosis of frontoparietal dementia.  The patient has been under a great deal of stress with this and has been very concerned about her poor prognosis.   the patient has lost weight and attributes that to her stress level.   Current Medications: Active Medications[1]   Allergies:   Amlodipine , Budesonide -formoterol  fumarate, Cefuroxime axetil, Cephalexin, Codeine, Doxycycline, Erythromycin base, Linaclotide, Olmesartan, Pravastatin,  Statins, Telmisartan, and Tiotropium bromide   ROS:   Please see the history of present illness.    All other systems reviewed and are negative.  EKGs/Labs/Other Studies Reviewed:    The following studies were reviewed today: Cardiac Studies & Procedures   ______________________________________________________________________________________________ CARDIAC CATHETERIZATION  CARDIAC CATHETERIZATION 08/08/2019  Conclusion  Moderate aortic stenosis with peak to peak gradient of 33 mmHg, mean gradient 29 mmHg, and calculated aortic valve area of 0.81 cm.  Normal pulmonary pressures with mean arterial pressure of 23 mmHg.  3 mmHg gradient across the mitral valve  Normal left ventricular systolic function with EF 65%.  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  Luminal irregularities are noted throughout the LAD territory.  Luminal irregularities noted throughout the circumflex.  The RCA is dominant, tortuous, and contains a patent mid to distal stent beyond several acute bends.  Diffuse luminal irregularities are noted otherwise.  Scattered significant calcification is noted in the coronary arterial tree, the thoracic aorta, and mitral annulus.  RECOMMENDATIONS:   There is discordance between aortic stenosis and coronary disease severity and symptoms.  Dyspnea on exertion may be multifactorial.  LAD aneurysm is stable.  Consider referral to aortic valve clinic for an opinion.  May need pulmonary evaluation.  May need transesophageal echo to exclude the possibility of mitral valve disease being more significant than suggested by current data.  Findings Coronary Findings Diagnostic  Dominance: Right  Left Anterior Descending The vessel exhibits minimal luminal irregularities. Non-stenotic Ost LAD lesion. Prox LAD to Mid LAD lesion  is 40% stenosed.  Left Circumflex There  is mild diffuse disease throughout the vessel.  First Obtuse Marginal Branch  Right Coronary Artery  Intervention  No interventions have been documented.   STRESS TESTS  NM PET CT CARDIAC PERFUSION MULTI W/ABSOLUTE BLOODFLOW 10/14/2022  Narrative   LV perfusion is abnormal. There is evidence of ischemia. There is no evidence of infarction. Defect 1: There is a medium defect with mild reduction in uptake present in the apical apex location(s) that is reversible. There is normal wall motion in the defect area. Consistent with ischemia.   Rest left ventricular function is normal. Rest EF: 72%. Stress left ventricular function is normal. Stress EF: 72%. End diastolic cavity size is normal. End systolic cavity size is normal.   Myocardial blood flow was computed to be 1.24ml/g/min at rest and 3.07ml/g/min at stress. Global myocardial blood flow reserve was 2.46 and was normal. In setting of prior RCA stent   Coronary calcium  was present on the attenuation correction CT images. Severe coronary calcifications were present. Coronary calcifications were present in the left anterior descending artery, left circumflex artery and right coronary artery distribution(s).   Findings are consistent with ischemia. The study is low risk.   Small area of apical ischemia with normal MBFR and no RWMA in this area   Medtronic Evolut valve noted in aortic position  CLINICAL DATA:  This over-read does not include interpretation of cardiac or coronary anatomy or pathology. The Cardiac PET CT interpretation by the cardiologist is attached.  COMPARISON:  None Available.  FINDINGS: Vascular: Normal heart size. No pericardial effusion. Prior transcatheter aortic valve replacement. Normal caliber thoracic aorta with severe calcified plaque. Severe coronary artery calcifications. Mitral annular calcifications.  Mediastinum/Nodes: Small hiatal hernia. No pathologically enlarged lymph nodes seen in the  chest.  Lungs/Pleura: Central airways are patent. Mild linear opacity of the right middle lobe, likely due to scarring or atelectasis. No consolidation, pleural effusion or pneumothorax. New small solid pulmonary nodule of the left upper lobe measuring 3 mm on series 4, image 36.  Upper Abdomen: No acute abnormality  Musculoskeletal: No aggressive appearing osseous lesions.  IMPRESSION: 1. No acute extracardiac abnormality. 2. New small solid pulmonary nodule of the left upper lobe measuring 3 mm. No follow-up needed if patient is low-risk.This recommendation follows the consensus statement: Guidelines for Management of Incidental Pulmonary Nodules Detected on CT Images: From the Fleischner Society 2017; Radiology 2017; 284:228-243. 3. Aortic Atherosclerosis (ICD10-I70.0).   Electronically Signed By: Rea Marc M.D. On: 10/14/2022 10:46   ECHOCARDIOGRAM  ECHOCARDIOGRAM COMPLETE 01/04/2024  Narrative ECHOCARDIOGRAM REPORT    Patient Name:   Cheryl Hernandez Date of Exam: 01/04/2024 Medical Rec #:  [id number]         Height:       63.0 in Accession #:    [id number]        Weight:       130.2 lb Date of Birth:  1940-05-16         BSA:          1.611 m Patient Age:    83 years          BP:           158/60 mmHg Patient Gender: F                 HR:           89 bpm. Exam Location:  [address]  Procedure: 2D Echo, Cardiac  Doppler and Color Doppler (Both Spectral and Color Flow Doppler were utilized during procedure).  Indications:    I05.0 Mitral stenosis Z95.2 S/p TAVR  History:        Patient has prior history of Echocardiogram examinations, most recent 11/17/2022. CAD, Mitral Stenosis and S/p TAVR (26mm Medtronic CoreValve Evolut Pro), Arrythmias:SVT; Risk Factors:Hypertension.  Sonographer:    Elsie Bohr RDCS Referring Phys: [phone] Eileene Kisling  IMPRESSIONS   1. Left ventricular ejection fraction, by estimation, is 65 to 70%. The left ventricle  has normal function. The left ventricle has no regional wall motion abnormalities. There is mild asymmetric left ventricular hypertrophy of the basal-septal segment. Left ventricular diastolic parameters are consistent with Grade II diastolic dysfunction (pseudonormalization). 2. Right ventricular systolic function is normal. The right ventricular size is normal. There is mildly elevated pulmonary artery systolic pressure. The estimated right ventricular systolic pressure is 44.2 mmHg. 3. Left atrial size was mildly dilated. 4. The mitral valve is degenerative. Moderate mitral valve regurgitation. Mild to moderate mitral stenosis with mean gradient 8 mmHg and MVA 2.0 cm^2 by VTI. Moderate mitral annular calcification. 5. Bioprosthetic aortic valve s/p TAVR with 26 mm Medtronic CoreValve Evolut Pro. Mean gradient 9 mmHg. EOA 2.35 cm^2. Trivial peri-valvular leakage noted. 6. The inferior vena cava is normal in size with greater than 50% respiratory variability, suggesting right atrial pressure of 3 mmHg.  FINDINGS Left Ventricle: Left ventricular ejection fraction, by estimation, is 65 to 70%. The left ventricle has normal function. The left ventricle has no regional wall motion abnormalities. The left ventricular internal cavity size was normal in size. There is mild asymmetric left ventricular hypertrophy of the basal-septal segment. Left ventricular diastolic parameters are consistent with Grade II diastolic dysfunction (pseudonormalization).  Right Ventricle: The right ventricular size is normal. No increase in right ventricular wall thickness. Right ventricular systolic function is normal. There is mildly elevated pulmonary artery systolic pressure. The tricuspid regurgitant velocity is 3.21 m/s, and with an assumed right atrial pressure of 3 mmHg, the estimated right ventricular systolic pressure is 44.2 mmHg.  Left Atrium: Left atrial size was mildly dilated.  Right Atrium: Right atrial size  was normal in size.  Pericardium: There is no evidence of pericardial effusion.  Mitral Valve: The mitral valve is degenerative in appearance. There is moderate calcification of the mitral valve leaflet(s). Moderate mitral annular calcification. Moderate mitral valve regurgitation. Mild to moderate mitral valve stenosis. MV peak gradient, 17.1 mmHg. The mean mitral valve gradient is 8.0 mmHg.  Tricuspid Valve: The tricuspid valve is normal in structure. Tricuspid valve regurgitation is mild.  Aortic Valve: Bioprosthetic aortic valve s/p TAVR with 26 mm Medtronic CoreValve Evolut Pro. Mean gradient 9 mmHg. EOA 2.35 cm^2. Trivial peri-valvular leakage noted. Aortic regurgitation PHT measures 289 msec. Aortic valve mean gradient measures 9.0 mmHg. Aortic valve peak gradient measures 19.3 mmHg. Aortic valve area, by VTI measures 2.35 cm.  Pulmonic Valve: The pulmonic valve was normal in structure. Pulmonic valve regurgitation is not visualized.  Aorta: The aortic root is normal in size and structure.  Venous: The inferior vena cava is normal in size with greater than 50% respiratory variability, suggesting right atrial pressure of 3 mmHg.  IAS/Shunts: No atrial level shunt detected by color flow Doppler.   LEFT VENTRICLE PLAX 2D LVIDd:         3.50 cm   Diastology LVIDs:         2.20 cm   LV e' medial:  4.90 cm/s LV PW:         0.90 cm   LV E/e' medial:  40.2 LV IVS:        1.40 cm   LV e' lateral:   7.83 cm/s LVOT diam:     2.00 cm   LV E/e' lateral: 25.2 LV SV:         102 LV SV Index:   63 LVOT Area:     3.14 cm   RIGHT VENTRICLE             IVC RV S prime:     17.40 cm/s  IVC diam: 1.10 cm TAPSE (M-mode): 2.1 cm RVSP:           44.2 mmHg  LEFT ATRIUM             Index        RIGHT ATRIUM           Index LA diam:        3.90 cm 2.42 cm/m   RA Pressure: 3.00 mmHg LA Vol (A2C):   48.8 ml 30.29 ml/m  RA Area:     10.50 cm LA Vol (A4C):   59.6 ml 36.99 ml/m  RA Volume:    21.40 ml  13.28 ml/m LA Biplane Vol: 54.7 ml 33.95 ml/m AORTIC VALVE AV Area (Vmax):    1.98 cm AV Area (Vmean):   2.29 cm AV Area (VTI):     2.35 cm AV Vmax:           219.50 cm/s AV Vmean:          137.000 cm/s AV VTI:            0.433 m AV Peak Grad:      19.3 mmHg AV Mean Grad:      9.0 mmHg LVOT Vmax:         138.00 cm/s LVOT Vmean:        99.700 cm/s LVOT VTI:          0.324 m LVOT/AV VTI ratio: 0.75 AI PHT:            289 msec  AORTA Ao Root diam: 2.90 cm Ao Asc diam:  2.90 cm  MITRAL VALVE                TRICUSPID VALVE MV Area (PHT): 3.48 cm     TR Peak grad:   41.2 mmHg MV Area VTI:   2.01 cm     TR Vmax:        321.00 cm/s MV Peak grad:  17.1 mmHg    Estimated RAP:  3.00 mmHg MV Mean grad:  8.0 mmHg     RVSP:           44.2 mmHg MV Vmax:       2.07 m/s MV Vmean:      130.5 cm/s   SHUNTS MV Decel Time: 218 msec     Systemic VTI:  0.32 m MV E velocity: 197.00 cm/s  Systemic Diam: 2.00 cm MV A velocity: 122.00 cm/s MV E/A ratio:  1.61  Dalton McleanMD Electronically signed by Ezra Kanner Signature Date/Time: 01/04/2024/5:41:31 PM    Final    MONITORS  CARDIAC EVENT MONITOR 06/20/2020  Narrative  Cardiac monitoring period was from 05/18/20-06/16/20.  Predominant rhythm was NSR with average HR 83bpm; ranging from 55-138bpm  There was no atrial fibrillation, SVT or VT detected  Rare PVCs, rare SVE  Overall, no significant  arrhythmias or pauses detected on the monitor.  Powell Sorrow, MD   CT SCANS  CT CORONARY MORPH W/CTA COR W/SCORE 11/03/2019  Addendum 11/09/2019  8:00 AM ADDENDUM REPORT: 11/09/2019 07:58  CLINICAL DATA:  Aortic stenosis  EXAM: Cardiac TAVR CT  TECHNIQUE: The patient was scanned on a Siemens Force 192 slice scanner. A 120 kV retrospective scan was triggered in the descending thoracic aorta at 111 HU's. Gantry rotation speed was 270 msecs and collimation was .9 mm. No beta blockade or nitro were given. The 3D  data set was reconstructed in 5% intervals of the R-R cycle. Systolic and diastolic phases were analyzed on a dedicated work station using MPR, MIP and VRT modes. The patient received 100mL OMNIPAQUE  IOHEXOL  350 MG/ML SOLN of contrast.  FINDINGS: Aortic Valve: Tricuspid aortic valve. Severely reduced cusp separation. Severely thickened, moderately calcified aortic valve cusps.  AV calcium  score: 839  Virtual Basal Annulus Measurements:  Maximum/Minimum Diameter: 21.9 x 19.3 mm  Perimeter: 64.4 mm  Area: 321 mm2  LVOT calcifications extending to anterior mitral leaflet.  Based on these measurements, the annulus measures within range for a 20 mm Edwards Sapien 3 valve. Sizing is borderline for 23 mm vs 26 mm Medtronic Evolut Pro supra-annular valve.  Sinus of Valsalva Measurements:  Non-coronary:  26 mm  Right - coronary:  26 mm  Left - coronary:  27 mm  Sinus of Valsalva Height:  Left: 16.2 mm  Right: 17.6 mm  Aorta: Severe mixed atherosclerotic plaque.  Sinotubular Junction:  25 mm  Ascending Thoracic Aorta:   mm  Aortic Arch:   mm  Descending Thoracic Aorta:   mm  Coronary Artery Height above Annulus:  Left Main: 11.1 mm  Right Coronary: 15.2 mm  Coronary Arteries: 3 vessel coronary artery disease.  Optimum Fluoroscopic Angle for Delivery: LAO 1 CAU 1  No left atrial appendage thrombus.  Moderate mitral annular calcification.  IMPRESSION: 1. Aortic Valve: Tricuspid aortic valve. Severely reduced cusp separation. Severely thickened, moderately calcified aortic valve cusps.  2.  AV calcium  score: 839  3.  LVOT calcifications extending to anterior mitral leaflet.  4. Borderline height of the left main coronary artery ostia, 11 mm. Adequate height of the RCA ostium.  5. Based on these findings, the annulus measures within range for a 20 mm Edwards Sapien 3 valve. Sizing is borderline for 23 mm vs 26 mm Medtronic Evolut Pro supra-annular  valve. Recommend Structural Heart Team discussion for valve selection.  6. Optimum Fluoroscopic Angle for Delivery: LAO 1 CAU 1   Electronically Signed By: Soyla Merck On: 11/09/2019 07:58  Narrative EXAM: OVER-READ INTERPRETATION  CT CHEST  The following report is an over-read performed by radiologist Dr. Selinda Blue of [facility] on 11/03/2019. This over-read does not include interpretation of cardiac or coronary anatomy or pathology. The coronary CTA interpretation by the cardiologist is attached.  COMPARISON:  10/27/2014 chest CT angiogram.  FINDINGS: Please see the separate concurrent chest CT angiogram report for details.  IMPRESSION: Please see the separate concurrent chest CT angiogram report for details.  Electronically Signed: By: Selinda DELENA Blue M.D. On: 11/03/2019 11:21     ______________________________________________________________________________________________      EKG:   EKG Interpretation Date/Time:  Thursday April 14 2024 10:58:57 EST Ventricular Rate:  94 PR Interval:  148 QRS Duration:  58 QT Interval:  330 QTC Calculation: 412 R Axis:   38  Text Interpretation: Normal sinus rhythm Normal ECG When compared with ECG  of 04-Feb-2023 11:38, No significant change was found Confirmed by Wonda Sharper [phone]) on 04/14/2024 11:15:37 AM    Recent Labs: No results found for requested labs within last 365 days.  Recent Lipid Panel    Component Value Date/Time   CHOL 157 12/22/2019 0841   CHOL 150 07/04/2019 0749   TRIG 151 (H) 12/22/2019 0841   HDL 67 12/22/2019 0841   HDL 68 07/04/2019 0749   CHOLHDL 2.3 12/22/2019 0841   VLDL 30 12/22/2019 0841   LDLCALC 60 12/22/2019 0841   LDLCALC 64 07/04/2019 0749     Risk Assessment/Calculations:                Physical Exam:    VS:  BP 130/60 (BP Location: Left Arm, Patient Position: Sitting, Cuff Size: Small)   Pulse 94   Ht 5' 1 (1.549 m)   Wt 122 lb 3.2 oz (55.4  kg)   SpO2 96%   BMI 23.09 kg/m     Wt Readings from Last 3 Encounters:  04/14/24 122 lb 3.2 oz (55.4 kg)  04/11/24 123 lb (55.8 kg)  04/21/23 130 lb 3.2 oz (59.1 kg)     GEN:  Well nourished, well developed in no acute distress HEENT: Normal NECK: No JVD; No carotid bruits LYMPHATICS: No lymphadenopathy CARDIAC: RRR, 2/6 systolic murmur at the right upper sternal border RESPIRATORY:  Clear to auscultation without rales, wheezing or rhonchi  ABDOMEN: Soft, non-tender, non-distended MUSCULOSKELETAL:  No edema; No deformity  SKIN: Warm and dry NEUROLOGIC:  Alert and oriented x 3 PSYCHIATRIC:  Normal affect   Assessment & Plan S/P TAVR (transcatheter aortic valve replacement) NYHA functional class I.  Mean transvalvular gradient 9 mmHg with trivial paravalvular regurgitation on recent echo.  She should follow SBE prophylaxis when indicated per guidelines. Nonrheumatic mitral valve stenosis Stable findings on recent echo with mean transmitral gradient of 8 mmHg and calculated mitral valve area of 2 cm.  Continue clinical follow-up. Coronary artery disease involving native coronary artery of native heart with angina pectoris Stable without anginal symptoms.  Continue aspirin  for antiplatelet therapy. Essential hypertension Blood pressure is well-controlled Mixed hyperlipidemia The cost of Repatha  has become prohibitive for her.  She previously has reported intolerance to statins.  We discussed her treatment options in the context of her advanced age.  Her statin intolerance has been myalgias.  She is willing to try low-dose therapy once she runs out of her current supply of Repatha .  Recommend rosuvastatin  5 mg 3 times per week with follow-up lipids and LFTs in about 6 months.      Medication Adjustments/Labs and Tests Ordered: Current medicines are reviewed at length with the patient today.  Concerns regarding medicines are outlined above.  Orders Placed This Encounter   Procedures   Hepatic function panel   Lipid panel   EKG 12-Lead   Meds ordered this encounter  Medications   rosuvastatin  (CRESTOR ) 5 MG tablet    Sig: Take 1 tablet (5 mg total) by mouth daily.    Dispense:  90 tablet    Refill:  3    Patient Instructions  Medication Instructions:  START Rosuvastatin  (Crestor ) 5 mg once daily three times weekly ONCE YOU STOP Repatha    *If you need a refill on your cardiac medications before your next appointment, please call your pharmacy*  Lab Work: To be completed in 6 months: lipid panel and LFT  If you have labs (blood work) drawn today and your tests are completely  normal, you will receive your results only by: MyChart Message (if you have MyChart) OR A paper copy in the mail If you have any lab test that is abnormal or we need to change your treatment, we will call you to review the results.  Testing/Procedures: None ordered today.  Follow-Up: At [facility], you and your health needs are our priority.  As part of our continuing mission to provide you with exceptional heart care, our providers are all part of one team.  This team includes your primary Cardiologist (physician) and Advanced Practice Providers or APPs (Physician Assistants and Nurse Practitioners) who all work together to provide you with the care you need, when you need it.  Your next appointment:   1 year(s)  Provider:   Ozell Fell, MD     Signed, Ozell Fell, MD  04/14/2024 12:29 PM    [facility]     [1]  Current Meds  Medication Sig   albuterol  (VENTOLIN  HFA) 108 (90 Base) MCG/ACT inhaler Inhale 2 puffs into the lungs every 6 (six) hours as needed for wheezing or shortness of breath.   aspirin  EC 81 MG tablet Take 81 mg by mouth every evening.   budesonide -formoterol  (SYMBICORT ) 80-4.5 MCG/ACT inhaler Inhale 2 puffs into the lungs in the morning and at bedtime.   carvedilol  (COREG ) 6.25 MG tablet TAKE 1 TABLET BY MOUTH 2 TIMES A  DAY WITH A MEAL. PLEASE KEEP UPCOMING APPOINTMENT IN ORDER TO RECEIVE ADDITIONAL REFILLS   Cholecalciferol (VITAMIN D3) 75 MCG (3000 UT) TABS Take 3,000 Units by mouth every other day. (Patient taking differently: Take 3,000 Units by mouth as needed.)   Coenzyme Q10 200 MG capsule Take 200 mg by mouth daily. (Patient taking differently: Take 200 mg by mouth as needed.)   docusate sodium (COLACE) 100 MG capsule 100 mg as needed.   escitalopram (LEXAPRO) 5 MG tablet Take 5 mg by mouth daily.   Evolocumab  (REPATHA  SURECLICK) 140 MG/ML SOAJ INJECT 1 ML INTO THE SKIN ONCE EVERY 14 DAYS   fexofenadine (ALLEGRA) 180 MG tablet Take 180 mg by mouth daily.   Multiple Vitamins-Minerals (MULTIVITAMIN WITH MINERALS) tablet Take 1 tablet by mouth every other day. In the morning   predniSONE  (DELTASONE ) 20 MG tablet 1 tablet daily x 7 days   rosuvastatin  (CRESTOR ) 5 MG tablet Take 1 tablet (5 mg total) by mouth daily.   Turmeric 400 MG CAPS Take by mouth daily.   "

## 2024-04-14 NOTE — Assessment & Plan Note (Signed)
 Stable without anginal symptoms.  Continue aspirin  for antiplatelet therapy.

## 2024-04-14 NOTE — Patient Instructions (Signed)
 Medication Instructions:  START Rosuvastatin  (Crestor ) 5 mg once daily three times weekly ONCE YOU STOP Repatha    *If you need a refill on your cardiac medications before your next appointment, please call your pharmacy*  Lab Work: To be completed in 6 months: lipid panel and LFT  If you have labs (blood work) drawn today and your tests are completely normal, you will receive your results only by: MyChart Message (if you have MyChart) OR A paper copy in the mail If you have any lab test that is abnormal or we need to change your treatment, we will call you to review the results.  Testing/Procedures: None ordered today.  Follow-Up: At [facility], you and your health needs are our priority.  As part of our continuing mission to provide you with exceptional heart care, our providers are all part of one team.  This team includes your primary Cardiologist (physician) and Advanced Practice Providers or APPs (Physician Assistants and Nurse Practitioners) who all work together to provide you with the care you need, when you need it.  Your next appointment:   1 year(s)  Provider:   Ozell Fell, MD

## 2024-04-14 NOTE — Assessment & Plan Note (Signed)
 The cost of Repatha  has become prohibitive for her.  She previously has reported intolerance to statins.  We discussed her treatment options in the context of her advanced age.  Her statin intolerance has been myalgias.  She is willing to try low-dose therapy once she runs out of her current supply of Repatha .  Recommend rosuvastatin  5 mg 3 times per week with follow-up lipids and LFTs in about 6 months.

## 2024-04-14 NOTE — Telephone Encounter (Signed)
 Pt scheduled to see Dr. Wonda today 1/15 in office.
